# Patient Record
Sex: Male | Born: 1935
Health system: Southern US, Community
[De-identification: ages and names within clinical notes are randomized; demographics above are authoritative.]

## PROBLEM LIST (undated history)

## (undated) DIAGNOSIS — K222 Esophageal obstruction: Secondary | ICD-10-CM

## (undated) DIAGNOSIS — R0600 Dyspnea, unspecified: Secondary | ICD-10-CM

## (undated) DIAGNOSIS — M549 Dorsalgia, unspecified: Secondary | ICD-10-CM

## (undated) DIAGNOSIS — I350 Nonrheumatic aortic (valve) stenosis: Secondary | ICD-10-CM

## (undated) DIAGNOSIS — I6529 Occlusion and stenosis of unspecified carotid artery: Secondary | ICD-10-CM

## (undated) DIAGNOSIS — I2510 Atherosclerotic heart disease of native coronary artery without angina pectoris: Secondary | ICD-10-CM

## (undated) DIAGNOSIS — G8929 Other chronic pain: Secondary | ICD-10-CM

## (undated) DIAGNOSIS — J449 Chronic obstructive pulmonary disease, unspecified: Secondary | ICD-10-CM

## (undated) DIAGNOSIS — I714 Abdominal aortic aneurysm, without rupture: Secondary | ICD-10-CM

## (undated) DIAGNOSIS — K219 Gastro-esophageal reflux disease without esophagitis: Secondary | ICD-10-CM

## (undated) DIAGNOSIS — I050 Rheumatic mitral stenosis: Secondary | ICD-10-CM

## (undated) DIAGNOSIS — H269 Unspecified cataract: Secondary | ICD-10-CM

## (undated) DIAGNOSIS — E785 Hyperlipidemia, unspecified: Secondary | ICD-10-CM

## (undated) DIAGNOSIS — I739 Peripheral vascular disease, unspecified: Secondary | ICD-10-CM

## (undated) DIAGNOSIS — I219 Acute myocardial infarction, unspecified: Secondary | ICD-10-CM

## (undated) DIAGNOSIS — E119 Type 2 diabetes mellitus without complications: Secondary | ICD-10-CM

## (undated) DIAGNOSIS — D126 Benign neoplasm of colon, unspecified: Secondary | ICD-10-CM

## (undated) DIAGNOSIS — I272 Pulmonary hypertension, unspecified: Secondary | ICD-10-CM

## (undated) DIAGNOSIS — G1221 Amyotrophic lateral sclerosis: Secondary | ICD-10-CM

## (undated) DIAGNOSIS — G629 Polyneuropathy, unspecified: Secondary | ICD-10-CM

## (undated) DIAGNOSIS — Z9289 Personal history of other medical treatment: Secondary | ICD-10-CM

## (undated) HISTORY — DX: Hyperlipidemia, unspecified: E78.5

## (undated) HISTORY — DX: Esophageal obstruction: K22.2

## (undated) HISTORY — DX: Abdominal aortic aneurysm, without rupture: I71.4

## (undated) HISTORY — DX: Gastro-esophageal reflux disease without esophagitis: K21.9

## (undated) HISTORY — DX: Rheumatic mitral stenosis: I05.0

## (undated) HISTORY — DX: Chronic obstructive pulmonary disease, unspecified: J44.9

## (undated) HISTORY — DX: Occlusion and stenosis of unspecified carotid artery: I65.29

## (undated) HISTORY — PX: APPENDECTOMY: SHX54

## (undated) HISTORY — DX: Dyspnea, unspecified: R06.00

## (undated) HISTORY — PX: ESOPHAGEAL DILATION: SHX303

## (undated) HISTORY — DX: Pulmonary hypertension, unspecified: I27.20

## (undated) HISTORY — DX: Acute myocardial infarction, unspecified: I21.9

## (undated) HISTORY — DX: Peripheral vascular disease, unspecified: I73.9

## (undated) HISTORY — DX: Atherosclerotic heart disease of native coronary artery without angina pectoris: I25.10

## (undated) HISTORY — DX: Type 2 diabetes mellitus without complications: E11.9

## (undated) HISTORY — DX: Unspecified cataract: H26.9

## (undated) HISTORY — DX: Benign neoplasm of colon, unspecified: D12.6

## (undated) HISTORY — PX: OTHER SURGICAL HISTORY: SHX169

## (undated) HISTORY — DX: Nonrheumatic aortic (valve) stenosis: I35.0

## (undated) HISTORY — DX: Personal history of other medical treatment: Z92.89

---

## 1999-04-26 ENCOUNTER — Encounter: Payer: Self-pay | Admitting: Gastroenterology

## 1999-04-26 ENCOUNTER — Ambulatory Visit (HOSPITAL_COMMUNITY): Admission: RE | Admit: 1999-04-26 | Discharge: 1999-04-26 | Payer: Self-pay | Admitting: Gastroenterology

## 2000-01-16 ENCOUNTER — Encounter: Payer: Self-pay | Admitting: *Deleted

## 2000-01-16 ENCOUNTER — Inpatient Hospital Stay (HOSPITAL_COMMUNITY): Admission: EM | Admit: 2000-01-16 | Discharge: 2000-01-17 | Payer: Self-pay | Admitting: *Deleted

## 2000-01-17 ENCOUNTER — Encounter: Payer: Self-pay | Admitting: *Deleted

## 2000-07-31 ENCOUNTER — Ambulatory Visit (HOSPITAL_COMMUNITY): Admission: RE | Admit: 2000-07-31 | Discharge: 2000-07-31 | Payer: Self-pay | Admitting: Gastroenterology

## 2000-07-31 ENCOUNTER — Encounter: Payer: Self-pay | Admitting: Gastroenterology

## 2000-09-11 ENCOUNTER — Ambulatory Visit (HOSPITAL_COMMUNITY): Admission: RE | Admit: 2000-09-11 | Discharge: 2000-09-11 | Payer: Self-pay | Admitting: Gastroenterology

## 2000-09-11 ENCOUNTER — Encounter (INDEPENDENT_AMBULATORY_CARE_PROVIDER_SITE_OTHER): Payer: Self-pay | Admitting: Specialist

## 2001-10-11 ENCOUNTER — Encounter: Payer: Self-pay | Admitting: Neurosurgery

## 2001-10-11 ENCOUNTER — Ambulatory Visit (HOSPITAL_COMMUNITY): Admission: RE | Admit: 2001-10-11 | Discharge: 2001-10-11 | Payer: Self-pay | Admitting: Neurosurgery

## 2002-01-13 ENCOUNTER — Encounter: Admission: RE | Admit: 2002-01-13 | Discharge: 2002-01-13 | Payer: Self-pay | Admitting: Neurosurgery

## 2002-01-13 ENCOUNTER — Encounter: Payer: Self-pay | Admitting: Neurosurgery

## 2006-01-25 ENCOUNTER — Ambulatory Visit: Payer: Self-pay | Admitting: Internal Medicine

## 2006-02-01 ENCOUNTER — Ambulatory Visit: Payer: Self-pay | Admitting: Internal Medicine

## 2006-02-01 LAB — CONVERTED CEMR LAB
AST: 29 units/L (ref 0–37)
BUN: 6 mg/dL (ref 6–23)
CO2: 30 meq/L (ref 19–32)
Creatinine, Ser: 0.9 mg/dL (ref 0.4–1.5)
Glomerular Filtration Rate, Af Am: 107 mL/min/{1.73_m2}
HDL: 25.3 mg/dL — ABNORMAL LOW (ref >39.0)
LDL DIRECT: 120.3 mg/dL
MCV: 90.1 fL (ref 78.0–100.0)
Platelets: 271 10*3/uL (ref 150–400)
Potassium: 4.9 meq/L (ref 3.5–5.1)
RBC: 5.18 M/uL (ref 4.22–5.81)
Sed Rate: 3 mm/hr (ref 0–20)
WBC: 7.5 10*3/uL (ref 4.5–10.5)

## 2006-02-05 ENCOUNTER — Ambulatory Visit: Payer: Self-pay | Admitting: *Deleted

## 2006-02-12 ENCOUNTER — Encounter: Admission: RE | Admit: 2006-02-12 | Discharge: 2006-02-12 | Payer: Self-pay | Admitting: Internal Medicine

## 2006-02-19 ENCOUNTER — Ambulatory Visit: Payer: Self-pay | Admitting: Cardiology

## 2006-02-27 ENCOUNTER — Inpatient Hospital Stay (HOSPITAL_BASED_OUTPATIENT_CLINIC_OR_DEPARTMENT_OTHER): Admission: RE | Admit: 2006-02-27 | Discharge: 2006-02-27 | Payer: Self-pay | Admitting: Cardiology

## 2006-02-27 ENCOUNTER — Ambulatory Visit: Payer: Self-pay | Admitting: Cardiology

## 2006-02-27 ENCOUNTER — Inpatient Hospital Stay (HOSPITAL_COMMUNITY): Admission: AD | Admit: 2006-02-27 | Discharge: 2006-02-28 | Payer: Self-pay | Admitting: Cardiology

## 2006-03-02 DIAGNOSIS — I739 Peripheral vascular disease, unspecified: Secondary | ICD-10-CM

## 2006-03-02 HISTORY — DX: Peripheral vascular disease, unspecified: I73.9

## 2006-03-05 ENCOUNTER — Ambulatory Visit: Payer: Self-pay

## 2006-03-05 ENCOUNTER — Ambulatory Visit: Payer: Self-pay | Admitting: Cardiology

## 2006-03-05 ENCOUNTER — Encounter: Payer: Self-pay | Admitting: Pulmonary Disease

## 2006-03-07 ENCOUNTER — Ambulatory Visit: Payer: Self-pay

## 2006-03-19 ENCOUNTER — Ambulatory Visit: Payer: Self-pay | Admitting: Internal Medicine

## 2006-03-20 ENCOUNTER — Ambulatory Visit: Payer: Self-pay | Admitting: Internal Medicine

## 2006-03-20 LAB — CONVERTED CEMR LAB
Cholesterol: 121 mg/dL (ref 0–200)
HDL: 31.4 mg/dL — ABNORMAL LOW (ref >39.0)
LDL Cholesterol: 70 mg/dL (ref 0–99)

## 2006-03-21 ENCOUNTER — Encounter: Payer: Self-pay | Admitting: Pulmonary Disease

## 2006-03-21 ENCOUNTER — Ambulatory Visit: Payer: Self-pay | Admitting: Pulmonary Disease

## 2006-04-04 ENCOUNTER — Ambulatory Visit: Payer: Self-pay | Admitting: Cardiology

## 2006-04-14 DIAGNOSIS — K219 Gastro-esophageal reflux disease without esophagitis: Secondary | ICD-10-CM

## 2006-04-14 DIAGNOSIS — I739 Peripheral vascular disease, unspecified: Secondary | ICD-10-CM

## 2006-06-18 ENCOUNTER — Ambulatory Visit: Payer: Self-pay | Admitting: Internal Medicine

## 2006-06-26 ENCOUNTER — Ambulatory Visit: Payer: Self-pay | Admitting: Internal Medicine

## 2006-06-26 LAB — CONVERTED CEMR LAB
Cholesterol: 133 mg/dL (ref 0–200)
HDL: 33.9 mg/dL — ABNORMAL LOW (ref >39.0)
LDL Cholesterol: 76 mg/dL (ref 0–99)
Total CHOL/HDL Ratio: 3.9

## 2006-07-04 ENCOUNTER — Encounter: Payer: Self-pay | Admitting: Internal Medicine

## 2006-07-04 ENCOUNTER — Ambulatory Visit: Payer: Self-pay | Admitting: Internal Medicine

## 2006-07-11 ENCOUNTER — Ambulatory Visit: Payer: Self-pay | Admitting: Cardiology

## 2006-08-15 ENCOUNTER — Encounter: Payer: Self-pay | Admitting: Internal Medicine

## 2007-04-10 ENCOUNTER — Ambulatory Visit: Payer: Self-pay

## 2007-07-17 ENCOUNTER — Ambulatory Visit: Payer: Self-pay | Admitting: Cardiovascular Disease

## 2007-10-24 ENCOUNTER — Ambulatory Visit: Payer: Self-pay | Admitting: Cardiovascular Disease

## 2007-10-24 ENCOUNTER — Ambulatory Visit: Payer: Self-pay

## 2007-10-24 LAB — CONVERTED CEMR LAB
AST: 28 units/L (ref 0–37)
HDL: 24.4 mg/dL — ABNORMAL LOW (ref >39.0)
LDL Cholesterol: 115 mg/dL — ABNORMAL HIGH (ref 0–99)
Total Bilirubin: 1.2 mg/dL (ref 0.3–1.2)
Total Protein: 6.5 g/dL (ref 6.0–8.3)

## 2008-04-14 ENCOUNTER — Ambulatory Visit: Payer: Self-pay | Admitting: Internal Medicine

## 2008-04-14 DIAGNOSIS — E785 Hyperlipidemia, unspecified: Secondary | ICD-10-CM

## 2008-04-14 LAB — CONVERTED CEMR LAB: Glucose, Bld: 135 mg/dL

## 2008-04-16 ENCOUNTER — Telehealth: Payer: Self-pay | Admitting: Internal Medicine

## 2008-04-16 LAB — CONVERTED CEMR LAB
BUN: 9 mg/dL (ref 6–23)
Eosinophils Relative: 2.1 % (ref 0.0–5.0)
GFR calc Af Amer: 143 mL/min
GFR calc non Af Amer: 118 mL/min
Hemoglobin: 16.1 g/dL (ref 13.0–17.0)
MCHC: 34.7 g/dL (ref 30.0–36.0)
Monocytes Absolute: 0.8 10*3/uL (ref 0.1–1.0)
Neutrophils Relative %: 63.3 % (ref 43.0–77.0)
Platelets: 273 10*3/uL (ref 150–400)
RBC: 5.24 M/uL (ref 4.22–5.81)
RDW: 13.2 % (ref 11.5–14.6)

## 2008-04-20 ENCOUNTER — Ambulatory Visit: Payer: Self-pay | Admitting: Cardiovascular Disease

## 2008-04-20 ENCOUNTER — Ambulatory Visit: Payer: Self-pay

## 2008-04-20 ENCOUNTER — Encounter: Payer: Self-pay | Admitting: Internal Medicine

## 2008-04-29 ENCOUNTER — Ambulatory Visit: Payer: Self-pay | Admitting: Internal Medicine

## 2008-05-17 ENCOUNTER — Ambulatory Visit: Payer: Self-pay | Admitting: Internal Medicine

## 2008-05-18 ENCOUNTER — Encounter (INDEPENDENT_AMBULATORY_CARE_PROVIDER_SITE_OTHER): Payer: Self-pay | Admitting: *Deleted

## 2008-05-26 ENCOUNTER — Ambulatory Visit: Payer: Self-pay | Admitting: Pulmonary Disease

## 2008-05-26 ENCOUNTER — Encounter: Payer: Self-pay | Admitting: Pulmonary Disease

## 2008-05-31 DIAGNOSIS — I050 Rheumatic mitral stenosis: Secondary | ICD-10-CM

## 2008-05-31 DIAGNOSIS — I714 Abdominal aortic aneurysm, without rupture, unspecified: Secondary | ICD-10-CM

## 2008-05-31 DIAGNOSIS — I2510 Atherosclerotic heart disease of native coronary artery without angina pectoris: Secondary | ICD-10-CM

## 2008-05-31 DIAGNOSIS — I350 Nonrheumatic aortic (valve) stenosis: Secondary | ICD-10-CM

## 2008-05-31 HISTORY — DX: Rheumatic mitral stenosis: I05.0

## 2008-05-31 HISTORY — DX: Abdominal aortic aneurysm, without rupture, unspecified: I71.40

## 2008-05-31 HISTORY — DX: Nonrheumatic aortic (valve) stenosis: I35.0

## 2008-05-31 HISTORY — DX: Atherosclerotic heart disease of native coronary artery without angina pectoris: I25.10

## 2008-05-31 HISTORY — DX: Abdominal aortic aneurysm, without rupture: I71.4

## 2008-06-02 ENCOUNTER — Ambulatory Visit: Payer: Self-pay | Admitting: Cardiovascular Disease

## 2008-06-02 LAB — CONVERTED CEMR LAB
BUN: 9 mg/dL (ref 6–23)
CO2: 30 meq/L (ref 19–32)
Chloride: 105 meq/L (ref 96–112)
Creatinine, Ser: 0.9 mg/dL (ref 0.4–1.5)
GFR calc Af Amer: 107 mL/min
HCT: 44.2 % (ref 39.0–52.0)
Hemoglobin: 15.1 g/dL (ref 13.0–17.0)
INR: 0.9 (ref 0.8–1.0)
Monocytes Absolute: 0.8 10*3/uL (ref 0.1–1.0)
Potassium: 5 meq/L (ref 3.5–5.1)
Prothrombin Time: 10 s — ABNORMAL LOW (ref 10.9–13.3)
RBC: 5.04 M/uL (ref 4.22–5.81)
Sodium: 140 meq/L (ref 135–145)
WBC: 8.4 10*3/uL (ref 4.5–10.5)

## 2008-06-04 ENCOUNTER — Ambulatory Visit: Payer: Self-pay | Admitting: Cardiovascular Disease

## 2008-06-04 ENCOUNTER — Inpatient Hospital Stay (HOSPITAL_BASED_OUTPATIENT_CLINIC_OR_DEPARTMENT_OTHER): Admission: RE | Admit: 2008-06-04 | Discharge: 2008-06-04 | Payer: Self-pay | Admitting: Cardiovascular Disease

## 2008-06-25 ENCOUNTER — Ambulatory Visit: Payer: Self-pay | Admitting: Pulmonary Disease

## 2008-06-25 LAB — CONVERTED CEMR LAB
Calcium: 9.8 mg/dL (ref 8.4–10.5)
GFR calc non Af Amer: 87.92 mL/min (ref >60)
Glucose, Bld: 106 mg/dL — ABNORMAL HIGH (ref 70–99)
Potassium: 4.7 meq/L (ref 3.5–5.1)

## 2008-06-29 DIAGNOSIS — I2510 Atherosclerotic heart disease of native coronary artery without angina pectoris: Secondary | ICD-10-CM

## 2008-06-29 DIAGNOSIS — I050 Rheumatic mitral stenosis: Secondary | ICD-10-CM | POA: Insufficient documentation

## 2008-06-29 DIAGNOSIS — I350 Nonrheumatic aortic (valve) stenosis: Secondary | ICD-10-CM

## 2008-06-29 DIAGNOSIS — I2789 Other specified pulmonary heart diseases: Secondary | ICD-10-CM

## 2008-06-29 DIAGNOSIS — I714 Abdominal aortic aneurysm, without rupture: Secondary | ICD-10-CM

## 2008-07-01 ENCOUNTER — Ambulatory Visit: Payer: Self-pay | Admitting: Cardiovascular Disease

## 2008-07-01 ENCOUNTER — Encounter: Payer: Self-pay | Admitting: Cardiovascular Disease

## 2008-07-01 ENCOUNTER — Ambulatory Visit: Payer: Self-pay

## 2008-07-09 ENCOUNTER — Ambulatory Visit: Payer: Self-pay | Admitting: Pulmonary Disease

## 2008-07-09 ENCOUNTER — Ambulatory Visit: Payer: Self-pay | Admitting: Cardiology

## 2008-07-12 ENCOUNTER — Ambulatory Visit: Payer: Self-pay | Admitting: Pulmonary Disease

## 2008-07-12 DIAGNOSIS — J438 Other emphysema: Secondary | ICD-10-CM | POA: Insufficient documentation

## 2008-07-14 ENCOUNTER — Encounter: Payer: Self-pay | Admitting: Pulmonary Disease

## 2008-08-12 ENCOUNTER — Ambulatory Visit: Payer: Self-pay | Admitting: Pulmonary Disease

## 2008-09-29 ENCOUNTER — Encounter: Payer: Self-pay | Admitting: Pulmonary Disease

## 2009-02-27 ENCOUNTER — Emergency Department (HOSPITAL_BASED_OUTPATIENT_CLINIC_OR_DEPARTMENT_OTHER): Admission: EM | Admit: 2009-02-27 | Discharge: 2009-02-27 | Payer: Self-pay | Admitting: Emergency Medicine

## 2009-02-27 ENCOUNTER — Encounter: Payer: Self-pay | Admitting: Internal Medicine

## 2009-02-27 ENCOUNTER — Ambulatory Visit: Payer: Self-pay | Admitting: Diagnostic Radiology

## 2009-03-02 ENCOUNTER — Ambulatory Visit: Payer: Self-pay | Admitting: Internal Medicine

## 2009-03-03 ENCOUNTER — Telehealth (INDEPENDENT_AMBULATORY_CARE_PROVIDER_SITE_OTHER): Payer: Self-pay | Admitting: *Deleted

## 2009-04-04 ENCOUNTER — Ambulatory Visit: Payer: Self-pay | Admitting: Internal Medicine

## 2009-04-04 DIAGNOSIS — R519 Headache, unspecified: Secondary | ICD-10-CM | POA: Insufficient documentation

## 2009-04-04 DIAGNOSIS — R51 Headache: Secondary | ICD-10-CM

## 2009-04-04 DIAGNOSIS — R42 Dizziness and giddiness: Secondary | ICD-10-CM

## 2009-04-05 ENCOUNTER — Encounter: Payer: Self-pay | Admitting: Internal Medicine

## 2009-04-05 ENCOUNTER — Ambulatory Visit: Payer: Self-pay | Admitting: Cardiology

## 2009-05-03 ENCOUNTER — Telehealth (INDEPENDENT_AMBULATORY_CARE_PROVIDER_SITE_OTHER): Payer: Self-pay | Admitting: *Deleted

## 2009-05-06 ENCOUNTER — Ambulatory Visit: Payer: Self-pay | Admitting: Internal Medicine

## 2009-05-12 ENCOUNTER — Ambulatory Visit: Payer: Self-pay | Admitting: Internal Medicine

## 2009-05-13 LAB — CONVERTED CEMR LAB
ALT: 17 units/L (ref 0–53)
AST: 28 units/L (ref 0–37)
Cholesterol: 191 mg/dL (ref 0–200)
HDL: 33.1 mg/dL — ABNORMAL LOW (ref >39.00)
VLDL: 21 mg/dL (ref 0.0–40.0)

## 2009-06-06 ENCOUNTER — Ambulatory Visit: Payer: Self-pay | Admitting: Internal Medicine

## 2009-06-06 ENCOUNTER — Inpatient Hospital Stay (HOSPITAL_COMMUNITY): Admission: AD | Admit: 2009-06-06 | Discharge: 2009-06-08 | Payer: Self-pay | Admitting: Internal Medicine

## 2009-06-06 DIAGNOSIS — R000 Tachycardia, unspecified: Secondary | ICD-10-CM

## 2009-06-06 DIAGNOSIS — I951 Orthostatic hypotension: Secondary | ICD-10-CM

## 2009-06-07 ENCOUNTER — Encounter (INDEPENDENT_AMBULATORY_CARE_PROVIDER_SITE_OTHER): Payer: Self-pay | Admitting: Internal Medicine

## 2009-06-07 ENCOUNTER — Ambulatory Visit: Payer: Self-pay | Admitting: Vascular Surgery

## 2009-06-24 ENCOUNTER — Ambulatory Visit: Payer: Self-pay | Admitting: Internal Medicine

## 2009-06-24 DIAGNOSIS — I739 Peripheral vascular disease, unspecified: Secondary | ICD-10-CM

## 2009-06-24 LAB — CONVERTED CEMR LAB
Glucose, Bld: 103 mg/dL
Hemoglobin: 16.3 g/dL

## 2009-06-27 LAB — CONVERTED CEMR LAB
AST: 28 units/L (ref 0–37)
Albumin: 3.8 g/dL (ref 3.5–5.2)
Alkaline Phosphatase: 64 units/L (ref 39–117)
LDL Cholesterol: 101 mg/dL — ABNORMAL HIGH (ref 0–99)

## 2009-07-21 ENCOUNTER — Encounter: Payer: Self-pay | Admitting: Cardiovascular Disease

## 2009-07-25 ENCOUNTER — Ambulatory Visit: Payer: Self-pay

## 2009-07-25 ENCOUNTER — Ambulatory Visit: Payer: Self-pay | Admitting: Cardiology

## 2009-07-25 ENCOUNTER — Encounter: Payer: Self-pay | Admitting: Internal Medicine

## 2009-07-25 ENCOUNTER — Ambulatory Visit (HOSPITAL_COMMUNITY): Admission: RE | Admit: 2009-07-25 | Discharge: 2009-07-25 | Payer: Self-pay | Admitting: Cardiovascular Disease

## 2009-07-25 ENCOUNTER — Encounter: Payer: Self-pay | Admitting: Cardiovascular Disease

## 2009-07-28 ENCOUNTER — Ambulatory Visit: Payer: Self-pay | Admitting: Cardiovascular Disease

## 2009-08-10 ENCOUNTER — Encounter: Payer: Self-pay | Admitting: Cardiovascular Disease

## 2009-09-12 ENCOUNTER — Encounter: Admission: RE | Admit: 2009-09-12 | Discharge: 2009-09-12 | Payer: Self-pay | Admitting: Orthopedic Surgery

## 2009-09-27 ENCOUNTER — Encounter: Payer: Self-pay | Admitting: Internal Medicine

## 2010-01-04 ENCOUNTER — Encounter: Payer: Self-pay | Admitting: Cardiovascular Disease

## 2010-01-04 ENCOUNTER — Ambulatory Visit: Payer: Self-pay | Admitting: Vascular Surgery

## 2010-02-21 ENCOUNTER — Encounter: Payer: Self-pay | Admitting: Internal Medicine

## 2010-04-02 DIAGNOSIS — I219 Acute myocardial infarction, unspecified: Secondary | ICD-10-CM

## 2010-04-02 HISTORY — DX: Acute myocardial infarction, unspecified: I21.9

## 2010-05-02 NOTE — Assessment & Plan Note (Signed)
Summary: nausea, lightheaded, possible dehydration//lch   Vital Signs:  Patient profile:   77 year old male Height:      67 inches Weight:      221.8 pounds O2 Sat:      93 % on Room air Pulse (ortho):   114 / minute BP standing:   100 / 60  Vitals Entered By: Shary Decamp (June 06, 2009 2:14 PM)  O2 Flow:  Room air CC: acute only; weak, dizzy, ha, nausea, vomiting x 1 only, stools normal x 3:30am Comments quit simvastatin 1 week ago due to myalgia Shary Decamp  June 06, 2009 2:26 PM    Serial Vital Signs/Assessments:  Time      Position  BP       Pulse  Resp  Temp     By 2:25 PM   Lying RA  120/80   114                   Stacia Hawks 2:25 PM   Sitting   110/60   116                   Stacia Hawks 2:25 PM   Standing  100/60   114                   Stacia Hawks   History of Present Illness: here with his granddaughter Joni Reining chief complaint dizziness the patient woke up this morning at 3:30 a.m. with severe dizziness," swimming head", this was associated with nausea, he vomited once dizziness is worse when he stands or when he moves his head  he had a similar episode a few months ago (11-10), he went to the emergency room diagnosed with gastroenteritis and receive IV fluids  ROS denies fevers when asked, states that he does have chest pain sometimes, episodes  last less than a minute, in the anterior upper chest, no radiation, never enough to  take nitroglycerin no palpitations  denies diarrhea, no blood in the stools or change stool color .  He does have some ill-defined upper abdominal  discomfort  denies any slurred speech, motor deficits or face deficits  he admits to feel short of breath for a while, symptoms increased lately.  He is 6 and her previous smoker, occasionally coughs  w/ brown sputum. No recent or plane trips orlower extremity edema  Current Medications (verified): 1)  Prilosec 20 Mg Cpdr (Omeprazole) .Marland Kitchen.. 1 By Mouth Once Daily 2)  Bayer Aspirin  325 Mg Tabs (Aspirin) .Marland Kitchen.. 1 A Day 3)  Nitroglycerin 0.4 Mg Subl (Nitroglycerin) .... One Tablet Under Tongue Every 5 Minutes As Needed For Chest Pain---May Repeat Times Three  Allergies (verified): No Known Drug Allergies  Past History:  Past Medical History: Reviewed history from 05/06/2009 and no changes required. GERD, with h/o stricture COPD, 04-2006: PFTs mild obstruction Peripheral vascular disease, iliac  (per cardiac cath 12/07) 2007 Cath: non obstructive CAD, mild MS, trivial AoS, small AAA Dyspnea 2007: w/u included a cath and PFTs, symptoms thought to be pulmonary R arm Bx : Bowen's disease, was refered to dermatology Hyperlipidemia Emphysema      - PFT 04/10 FVC 3.45 (90%), FEV1 2.72 (106%), TLC 5.36 (93%), DLCO 74%, no BD response      - ?of pseudonormalization of PFT findings with both obstructive and restrictive defects ABDOMINAL AORTIC ANEURYSM--per cardiac catheterization 3 --2010 AORTIC STENOSIS, MILD,  MITRAL STENOSIS  --her cardiac catheterization 3 --2010 CORONARY  ARTERY DISEASE --mild, medical manage , per cardiac catheterization 3-- 2010 HYPERTENSION, PULMONARY   Past Surgical History: Reviewed history from 06/29/2008 and no changes required. Appendectomy esophageal dilatation  coronary angiography, abdominal aortography-02-27-06 Dr. Samule Ohm  Social History: Reviewed history from 05/06/2009 and no changes required.  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   He enjoys watching sports.   He has been widowed for 12 years.   He has 2 grown sons.   He previously smoked more than 70 pack years but quit in 1988.  Denies significant alcohol use.   Denies illicit drug use.  Review of Systems      See HPI  Physical Exam  General:  alert and well-developed. no apparent distress, not diaphoretic Lungs:  Normal respiratory effort, chest expands symmetrically. Lungs are clear to auscultation, no crackles or wheezes.  decreased BS Heart:  tachycardic Abdomen:  soft, no distention, no masses, no guarding, and no rigidity.  mild lower abdominal tenderness without rebound Rectal:  No external abnormalities noted. Normal sphincter tone. No rectal masses or tenderness. Brown Hemoccult-negative Prostate:  prostate slightly enlarged, nontender Extremities:  no edema Neurologic:  speech fluent motor without obvious deficits face symmetric EOMI  Psych:  Oriented X3, memory intact for recent and remote, normally interactive, good eye contact, not anxious appearing, and not depressed appearing.     Impression & Recommendations:  Problem # 1:  assessment and plan 77 year old gentleman with multiple cardiovascular risk factors, hypertension, COPD, mild CAD her cardiac catheterization 05/2008, peripheral vascular disease and a AAA presents today with the following problems: -acute onset of dizziness with a normal neurological exam -nausea, vomited once -tachycardia, EKG  RBBB (old) ,TWI V1 V2 (new) -mild orthostatic hypotension -mild lower abdominal tenderness on exam  plan: Admit to telemetry, CT of the head, IV fluids, call cardiology, rule out MI with serial enzymes. Discussed with Dr. Janee Morn consider call neurology  Problem # 2:  addendum we also  --called cardiology for a consult -- discussed EKG with the cardiologist on call  --- no acute changes. --time spent coordinating this patient care: More than 55 minutes  Complete Medication List: 1)  Prilosec 20 Mg Cpdr (Omeprazole) .Marland Kitchen.. 1 by mouth once daily 2)  Bayer Aspirin 325 Mg Tabs (Aspirin) .Marland Kitchen.. 1 a day 3)  Nitroglycerin 0.4 Mg Subl (Nitroglycerin) .... One tablet under tongue every 5 minutes as needed for chest pain---may repeat times three  Other Orders: Fingerstick (55732) Hgb (85018) Glucose, (CBG) (20254)  Laboratory Results   Blood Tests     Glucose (random): 117 mg/dL   (Normal Range: 27-062)   CBC   HGB:  17.2 g/dL   (Normal  Range: 37.6-28.3 in Males, 12.0-15.0 in Females)

## 2010-05-02 NOTE — Letter (Signed)
Summary: [facility] Office Note  [facility] Office Note   Imported By: Roderic Ovens 08/30/2009 11:00:01  _____________________________________________________________________  External Attachment:    Type:   Image     Comment:   External Document

## 2010-05-02 NOTE — Op Note (Signed)
Summary: Lumbar Epidural Steroid Injection/[facility]   Lumbar Epidural Steroid Injection/[facility]   Imported By: Lanelle Bal 03/01/2010 13:14:00  _____________________________________________________________________  External Attachment:    Type:   Image     Comment:   External Document

## 2010-05-02 NOTE — Assessment & Plan Note (Signed)
Summary: dizzy spells/CT scan reviewed : 3.3 cm infrarenal AAA; this s...   Vital Signs:  Patient profile:   77 year old male Weight:      221 pounds BMI:     34.74 O2 Sat:      96 % Temp:     98.7 degrees F oral Pulse rate:   85 / minute Resp:     16 per minute BP sitting:   116 / 64  (left arm) Cuff size:   large  Vitals Entered By: Shonna Chock (April 04, 2009 4:37 PM) CC: Dizzy Spells(Ongoing concern), Headaches Comments REVIEWED MED LIST, PATIENT AGREED DOSE AND INSTRUCTION CORRECT    Primary Care Provider:  Nolon Rod. Paz MD  CC:  Dizzy Spells(Ongoing concern) and Headaches.  History of Present Illness: Progressive headaches & dizziness over 2 months. The patient denies nausea, vomiting, sweats, tearing of eyes, nasal congestion, sinus pain, sinus pressure, photophobia, and phonophobia.  The headache is described as mid frontal,  intermittent and pressure-like, lasting 10 minutes or <.  High-risk features (red flags) include vision loss or change, new type of headache, and age >50 years.  The patient denies the following high-risk features: fever, neck pain/stiffness, focal weakness, altered mental status, rash, trauma, pain worse with exertion, immunosuppression, and anticoagulation use.  Prior treatment has included a NSAID or Excedrin with some benefit. No triggers. No prodrome or aura. No PMH or FH of migraines.  ? syncope on  01/2009( 02/28/2009 ER visit: Gastroenteritis; no mention of dizziness).3-4 cups coffee/ day; no excess NSAIDS. Dizzines not related to positition change or heart rhythm; it lasts 3-4 min.  Allergies (verified): No Known Drug Allergies  Review of Systems General:  Denies chills, sweats, and weight loss. Eyes:  Complains of blurring; denies double vision and vision loss-both eyes. ENT:  Complains of decreased hearing and ringing in ears; Above are chronic ; no facial pain or purulence. CV:  Complains of lightheadness; denies near fainting and  palpitations. Neuro:  Complains of poor balance and sensation of room spinning; denies brief paralysis, numbness, tingling, and weakness; Rare  minor vertigo .  Physical Exam  General:  well-nourished,in no acute distress; alert,appropriate and cooperative throughout examination Eyes:  No corneal or conjunctival inflammation noted. EOMI but OS deviates laterally with accommodation. Ptosis OS.Perrla. Field of  Vision grossly normal. Ears:  External ear exam shows no significant lesions or deformities.  Otoscopic examination reveals clear canals, tympanic membranes are intact bilaterally without bulging, retraction, inflammation or discharge. Hearing is grossly decreased  bilaterally.TMs dull Heart:  normal rate, regular rhythm, no gallop, no rub, no JVD, and grade1.5-2  /6 systolic murmur.   Pulses:  R and L carotid bruits Neurologic:  alert & oriented X3, cranial nerves II-XII intact except EOM, strength normal in all extremities except L hand , gait abnormal , limping on R, DTRs symmetrical and normal, finger-to-nose normal, and Romberg negative.   Skin:  Intact without suspicious lesions or rashes Psych:  subdued.  Slow responses but oriented    Impression & Recommendations:  Problem # 1:  DIZZINESS (ICD-780.4)  Problem # 2:  HEADACHE (ICD-784.0)  His updated medication list for this problem includes:    Bayer Aspirin 325 Mg Tabs (Aspirin) .Marland Kitchen... 1 a day    Tramadol Hcl 50 Mg Tabs (Tramadol hcl) .Marland Kitchen... 1 q 6 hrs as needed headache  Orders: Radiology Referral (Radiology)  Complete Medication List: 1)  Prilosec 20 Mg Cpdr (Omeprazole) .Marland Kitchen.. 1 by mouth once  daily 2)  Bayer Aspirin 325 Mg Tabs (Aspirin) .Marland Kitchen.. 1 a day 3)  Nitroglycerin 0.4 Mg Subl (Nitroglycerin) .... One tablet under tongue every 5 minutes as needed for chest pain---may repeat times three 4)  Azithromycin 250 Mg Tabs (Azithromycin) .... As per pack 5)  Tramadol Hcl 50 Mg Tabs (Tramadol hcl) .Marland Kitchen.. 1 q 6 hrs as needed  headache  Patient Instructions: 1)  Keep Headache Diary . Wean caffeine as discussed Prescriptions: TRAMADOL HCL 50 MG TABS (TRAMADOL HCL) 1 q 6 hrs as needed headache  #30 x 1   Entered and Authorized by:   Marga Melnick MD   Signed by:   Marga Melnick MD on 04/04/2009   Method used:   Faxed to ...       [facility] [phone]* (retail)       [address]       [place], [place]  [address]       Ph: [phone]       Fax: [phone]   RxID:   [id number]

## 2010-05-02 NOTE — Assessment & Plan Note (Signed)
Summary: J4N      Allergies Added: NKDA  Visit Type:  Follow-up Primary Provider:  Nolon Rod. Paz MD  CC:  f1y/pt needs new Rx for Nitro.   Marland Kitchen  History of Present Illness: This is a 77 year old gentleman with chronic dyspnea, mixed valvular heart disease, and peripheral arterial disease, presenting for followup evaluation.  The patient has chronic dyspnea with exertion. This is grossly unchanged over time. He is fairly limited with class III symptoms.  His other major complaint is leg discomfort and numbness. He has leg pain both at rest and with activity. He complains of numbness that goes down the right leg from the hip region into the foot and it is worse with standing or walking. He also complains of left foot numbness. He denies calf muscle claudication.  The patient denies chest pain, orthopnea, PND, or edema. He was hospitalized a few months ago with near syncope after a viral illness. He's had no further problems with this symptom.    Current Medications (verified): 1)  Prilosec 20 Mg Cpdr (Omeprazole) .Marland Kitchen.. 1 By Mouth Once Daily 2)  Bayer Aspirin 325 Mg Tabs (Aspirin) .Marland Kitchen.. 1 A Day 3)  Nitroglycerin 0.4 Mg Subl (Nitroglycerin) .... One Tablet Under Tongue Every 5 Minutes As Needed For Chest Pain---May Repeat Times Three 4)  Zetia 10 Mg Tabs (Ezetimibe) .Marland Kitchen.. 1 A Day - Due Office Visit July 2011  Allergies (verified): No Known Drug Allergies  Past History:  Past medical history reviewed for relevance to current acute and chronic problems.  Past Medical History: Reviewed history from 06/24/2009 and no changes required. GERD, with h/o stricture Peripheral vascular disease, iliac  (per cardiac cath 12/07) Carotid Artery Dz , last u/s 3-11, next 6 months , per vasc. surgery  2007 Cath: non obstructive CAD, mild MS, trivial AoS, small AAA Dyspnea 2007: w/u included a cath and PFTs, symptoms thought to be pulmonary R arm Bx : Bowen's disease, was refered to  dermatology Hyperlipidemia Emphysema      - PFT 04/10 FVC 3.45 (90%), FEV1 2.72 (106%), TLC 5.36 (93%), DLCO 74%, no BD response      - ?of pseudonormalization of PFT findings with both obstructive and restrictive defects ABDOMINAL AORTIC ANEURYSM--per cardiac catheterization 3 --2010 AORTIC STENOSIS, MILD,  MITRAL STENOSIS  --her cardiac catheterization 3 --2010 CORONARY ARTERY DISEASE --mild, medical manage , per cardiac catheterization 3-- 2010 HYPERTENSION, PULMONARY   Review of Systems       Positive for generalized fatigue, otherwise negative except as per HPI   Vital Signs:  Patient profile:   77 year old male Height:      67 inches Weight:      221 pounds BMI:     34.74 Pulse rate:   81 / minute Pulse rhythm:   regular Resp:     16 per minute BP sitting:   128 / 86  (left arm) Cuff size:   large  Vitals Entered By: Judithe Modest CMA (July 28, 2009 9:43 AM)  Physical Exam  General:  Pt is alert and oriented, elderly, obese, in no acute distress. HEENT: normal Neck: normal carotid upstrokes with bilateral bruits, JVP normal Lungs: CTA CV: RRR with 3/6 harsh ejection murmur at the right upper sternal border Abd: soft, obese, NT, positive BS, no bruit, no organomegaly Ext: trace bilateral pretibial edema. peripheral pulses: right foot trace DP and PT pulses, left foot 1+ DP and PT pulses. Both feet are warm Skin: warm and dry without rash  EKG  Procedure date:  07/28/2009  Findings:      Normal sinus rhythm, right bundle branch block, left anterior fascicular block, heart rate 81 beats per minute.  Echocardiogram  Procedure date:  07/25/2009  Findings:      Study Conclusions            - Left ventricle: The cavity size was normal. There was moderate       concentric hypertrophy. Systolic function was normal. The       estimated ejection fraction was in the range of 55% to 60%. There       was dynamic obstruction, with mid-cavity obliteration. Wall  motion       was normal; there were no regional wall motion abnormalities.       Doppler parameters are consistent with abnormal left ventricular       relaxation (grade 1 diastolic dysfunction).     - Aortic valve: There was moderate stenosis. Valve area:       0.96cm 2(VTI). Valve area: 0.91cm 2 (Vmax).     - Mitral valve: Moderately to severely calcified annulus. The       findings are consistent with trivial stenosis. Mild regurgitation.       Valve area by pressure half-time: 2.24cm 2. Valve area by       continuity equation (using LVOT flow): 1.8cm 2.     - Left atrium: The atrium was moderately dilated.     - Tricuspid valve: Mild-moderate regurgitation.     - Pulmonary arteries: Systolic pressure was mildly to moderately       increased. PA peak pressure: 41mm Hg (S).         Impression & Recommendations:  Problem # 1:  AORTIC STENOSIS, MILD (ICD-424.1) The patient's aortic stenosis has progressed and is now in the moderate range. Recommend repeat echocardiogram in one year since he does have progressive aortic stenosis. I suspect his dyspnea is multifactorial and related to obesity, COPD, and valvular heart disease.  His updated medication list for this problem includes:    Nitroglycerin 0.4 Mg Subl (Nitroglycerin) ..... One tablet under tongue every 5 minutes as needed for chest pain---may repeat times three  Orders: Echocardiogram (Echo)  Problem # 2:  CAROTID ARTERY DISEASE (ICD-433.10) The patient was found to have moderate bilateral internal carotid artery stenosis during his hospitalization. Velocities by duplex ultrasound were in the range of 60-80% bilaterally. The patient was seen by vascular surgery and followup carotid duplex was recommended for this fall  If this is not done in the [facility] office, we will obtain a duplex in our office for followup. His updated medication list for this problem includes:    Bayer Aspirin 325 Mg Tabs (Aspirin) .Marland Kitchen... 1 a day  Orders: EKG  w/ Interpretation (93000)  Problem # 3:  ABDOMINAL AORTIC ANEURYSM (ICD-441.4) Small aneurysm at 3.4 x 3.5 cm on recent ultrasound. Followup in one year.  Problem # 4:  PERIPHERAL VASCULAR DISEASE (ICD-443.9) The patient has long-standing right common iliac occlusion and claudication symptoms related to this. His left ABI is normal at 1.0 and his right ABI is in the mild range of 0.71. I think his leg numbness and some of his leg pain is non-vascular in origin. He does have a history of low back problems and has been seen in the past by Dr. Darrelyn Hillock.  I asked him to contact Dr Jeannetta Ellis office for follow-up evaluation.  Will continue medical therapy first peripheral arterial disease and  repeat ABIs next year.  Other Orders: Abdominal Aorta Duplex (Abd Aorta Duplex) Arterial Duplex Lower Extremity (Arterial Duplex Low)  Patient Instructions: 1)  Your physician recommends that you schedule a follow-up appointment in: 12 months. The office will mail you a reminder letter 2 months prior appointment date. 2)  Your MD recommeds for you to see Dr. Darrelyn Hillock for leg and back pain. 3)  Your physician has requested that you have an echocardiogram.  Echocardiography is a painless test that uses sound waves to create images of your heart. It provides your doctor with information about the size and shape of your heart and how well your heart's chambers and valves are working.  This procedure takes approximately one hour. There are no restrictions for this procedure. to be done in 12 months 4)  Your physician has requested that you have an abdominal aorta  In 12 monthsduplex. During this test, an ultrasound is used to evaluate the aorta. Allow 30 minutes for this exam. Do not eat after midnight the day before and avoid carbonated beverages. There are no restrictions or special instructions. 5)  Your physician has requested that you have a lower or upper extremity arterial duplex.  This test is an ultrasound of the  arteries in the legs or arms.  It looks at arterial blood flow in the legs and arms.  Allow one hour for Lower and Upper Arterial scans. There are no restrictions or special instructions. with ABI's in 12 months.

## 2010-05-02 NOTE — Letter (Signed)
Summary: s/p CT myelogram, Rx PT and local shots-----Dr [facility]   Imported By: Lanelle Bal 10/14/2009 08:30:15  _____________________________________________________________________  External Attachment:    Type:   Image     Comment:   External Document

## 2010-05-02 NOTE — Assessment & Plan Note (Signed)
Summary: fu/kdc  Flu Vaccine Consent Questions     Do you have a history of severe allergic reactions to this vaccine? no    Any prior history of allergic reactions to egg and/or gelatin? no    Do you have a sensitivity to the preservative Thimersol? no    Do you have a past history of Guillan-Barre Syndrome? no    Do you currently have an acute febrile illness? no    Have you ever had a severe reaction to latex? no    Vaccine information given and explained to patient? yes    Are you currently pregnant? no    Lot Number:[id number]   Exp Date:09/29/2009   Site Given  rightt Deltoid IM Shary Decamp  May 06, 2009 3:51 PM   Vital Signs:  Patient profile:   77 year old male Height:      67 inches Weight:      221 pounds Pulse rate:   76 / minute BP sitting:   100 / 70  Vitals Entered By: Shary Decamp (May 06, 2009 2:22 PM) CC: rov   History of Present Illness: patient here at my request, last seen about a year ago several OV  and events since i last saw him are  reviewed :   cardiac catheterization 3 -- 10  ASSESSMENT:   1. Diffuse nonobstructive coronary artery disease as outlined above.   2. Normal left ventricular systolic function.   3. Mild mitral valve stenosis.   4. Mild aortic valve stenosis.   5. Abdominal aortic aneurysm.   6. Right common iliac occlusion.   Labs 11-10 at the ER: normal CBC , BMP, LFTs  HA-- saw Dr Alfonse Flavors recently, CT neg, symptoms resolved GERD, on prilosec, symptoms well controlled  COPD-- breathing "terrible", gradually worse  Hyperlipidemia-- not on meds     Current Medications (verified): 1)  Prilosec 20 Mg Cpdr (Omeprazole) .Marland Kitchen.. 1 By Mouth Once Daily 2)  Bayer Aspirin 325 Mg Tabs (Aspirin) .Marland Kitchen.. 1 A Day 3)  Nitroglycerin 0.4 Mg Subl (Nitroglycerin) .... One Tablet Under Tongue Every 5 Minutes As Needed For Chest Pain---May Repeat Times Three  Allergies (verified): No Known Drug Allergies  Past History:  Past Medical  History: GERD, with h/o stricture COPD, 04-2006: PFTs mild obstruction Peripheral vascular disease, iliac  (per cardiac cath 12/07) 2007 Cath: non obstructive CAD, mild MS, trivial AoS, small AAA Dyspnea 2007: w/u included a cath and PFTs, symptoms thought to be pulmonary R arm Bx : Bowen's disease, was refered to dermatology Hyperlipidemia Emphysema      - PFT 04/10 FVC 3.45 (90%), FEV1 2.72 (106%), TLC 5.36 (93%), DLCO 74%, no BD response      - ?of pseudonormalization of PFT findings with both obstructive and restrictive defects ABDOMINAL AORTIC ANEURYSM--per cardiac catheterization 3 --2010 AORTIC STENOSIS, MILD,  MITRAL STENOSIS  --her cardiac catheterization 3 --2010 CORONARY ARTERY DISEASE --mild, medical manage , per cardiac catheterization 3-- 2010 HYPERTENSION, PULMONARY   Family History: MI-- Father died at 67 of a myocardial infarction HTN--no DM--no prostate ca--no colon ca--no  Social History:  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   He enjoys watching sports.   He has been widowed for 12 years.   He has 2 grown sons.   He previously smoked more than 70 pack years but quit in 1988.  Denies significant alcohol use.   Denies illicit drug use.  Review  of Systems CV:  Denies chest pain or discomfort and swelling of feet; (+) claudication R leg. Resp:  Denies coughing up blood; mild cough some coughing  not on inhaler or nebulizer b/c they don't help. GI:  Denies diarrhea, nausea, and vomiting.  Physical Exam  General:  alert and well-developed.  NAD Lungs:  Normal respiratory effort, chest expands symmetrically. Lungs are clear to auscultation, no crackles or wheezes. decreased BS Heart:  normal rate, regular rhythm, no murmur, and no gallop.   Abdomen:  soft, non-tender, no distention, no masses, no guarding, and no rigidity.   Extremities:  no pretibial edema bilaterally    Impression & Recommendations:  Problem  # 1:  HEADACHE (ICD-784.0) rsolved   Problem # 2:  ABDOMINAL AORTIC ANEURYSM (ICD-441.4) last eval by a CT 01-2009 ---->3.3 cm   Problem # 3:  CORONARY ARTERY DISEASE (ICD-414.00) medical mangment, labs  Labs 11-10 at the ER: normal CBC , BMP, LFTs  His updated medication list for this problem includes:    Bayer Aspirin 325 Mg Tabs (Aspirin) .Marland Kitchen... 1 a day    Nitroglycerin 0.4 Mg Subl (Nitroglycerin) ..... One tablet under tongue every 5 minutes as needed for chest pain---may repeat times three  Problem # 4:  EMPHYSEMA (ICD-492.8) getting worse? not on any meds as they " don't Help" flu shot today  Problem # 5:  PERIPHERAL VASCULAR DISEASE (ICD-443.9) still symptomatic no pain at rest   Problem # 6:  F2F > 25 min , mostly due to chart review   Complete Medication List: 1)  Prilosec 20 Mg Cpdr (Omeprazole) .Marland Kitchen.. 1 by mouth once daily 2)  Bayer Aspirin 325 Mg Tabs (Aspirin) .Marland Kitchen.. 1 a day 3)  Nitroglycerin 0.4 Mg Subl (Nitroglycerin) .... One tablet under tongue every 5 minutes as needed for chest pain---may repeat times three  Other Orders: Admin 1st Vaccine (78295) Flu Vaccine 7yrs + (62130)  Patient Instructions: 1)  came back fasting: 2)  FLP--Dx CAD 3)  AST ALT  Dx high chol 4)  Please schedule a follow-up appointment in 6 months .

## 2010-05-02 NOTE — Letter (Signed)
Summary: [facility] Form/[place] [facility]  [facility] Options Form/[facility] [place]   Imported By: Lanelle Bal 04/06/2009 13:20:46  _____________________________________________________________________  External Attachment:    Type:   Image     Comment:   External Document

## 2010-05-02 NOTE — Miscellaneous (Signed)
Summary: Orders Update  Clinical Lists Changes  Orders: Added new Test order of Arterial Duplex Lower Extremity (Arterial Duplex Low) - Signed 

## 2010-05-02 NOTE — Progress Notes (Signed)
Summary: needs ov  ---- Converted from flag ---- ---- 03/10/2009 12:41 PM, Fernando E. Paz MD wrote: needs ROV, if he has not been seen in the last few months,  please arrange ------------------------------ patient has a appt on feb 4,2011.Marland KitchenMarland KitchenMarland KitchenBarb Merino  May 05, 2009 9:44 AM

## 2010-05-02 NOTE — Letter (Signed)
Summary: next carotid u/s 6 months---[facility]    [facility] Office Visit   Imported By: Roderic Ovens 01/24/2010 16:17:18  _____________________________________________________________________  External Attachment:    Type:   Image     Comment:   External Document

## 2010-05-02 NOTE — Miscellaneous (Signed)
Summary: Orders Update  Clinical Lists Changes  Orders: Added new Test order of Abdominal Aorta Duplex (Abd Aorta Duplex) - Signed 

## 2010-05-02 NOTE — Assessment & Plan Note (Signed)
Summary: post hosp, dizzy/swh   Vital Signs:  Patient profile:   77 year old male Height:      67 inches Weight:      216 pounds BMI:     33.95 Pulse rate:   75 / minute BP sitting:   160 / 60  Vitals Entered By: Kandice Hams (June 24, 2009 8:39 AM) CC: c/o dizziness fatigued   History of Present Illness:  DATE OF ADMISSION:  06/06/2009   DATE OF DISCHARGE:  06/08/2009      DISCHARGE DIAGNOSES:   1. Dizziness secondary to:       a.     Dehydration.       b.     Mild aortic stenosis and mild mitral stenosis which may not        be playing a significant role in this.       c.     Bilateral 60-80% internal carotid artery stenosis.   2. Bilateral carotid artery disease as mentioned above.   3. Not a hypertensive and not on antihypertensive medications.   4. Gastroesophageal reflux disease.   5. Nonobstructive coronary artery disease, but no current coronary       issues.   6. Chronic obstructive pulmonary disease.   7. Peripheral arterial disease.   8. Abdominal aortic aneurysm.   9. Dyslipidemia.   chart reviewed --creatinine 0.8, potassium 3.7, hemoglobin 14.1 --total protein 5.5 slightly decreased,  albumin 3.1 slightly decreased, calcium 8.2, slightly decreased -- TSH 3.0 -- d-dimer and 3 sets of cardiac enzymes negative -- portable chest x-ray question of an infiltrate, they recommended PA and lateral (not done) --CT of the head nonacute --carotid ultrasound 3/11   Summary:   60 to 80% right ICA stenosis(higher end of scale). 60 to 80% left   ICA stenosis.ECA stenosis bilaterally. Vertebrals are patent with   antegrade flow. --saw  the cardiovascular surgeons, they recommend a repeat carotid ultrasound 9/11 --was started on zetia  Allergies: No Known Drug Allergies  Past History:  Past Medical History: GERD, with h/o stricture Peripheral vascular disease, iliac  (per cardiac cath 12/07) Carotid Artery Dz , last u/s 3-11, next 6 months , per vasc. surgery    2007 Cath: non obstructive CAD, mild MS, trivial AoS, small AAA Dyspnea 2007: w/u included a cath and PFTs, symptoms thought to be pulmonary R arm Bx : Bowen's disease, was refered to dermatology Hyperlipidemia Emphysema      - PFT 04/10 FVC 3.45 (90%), FEV1 2.72 (106%), TLC 5.36 (93%), DLCO 74%, no BD response      - ?of pseudonormalization of PFT findings with both obstructive and restrictive defects ABDOMINAL AORTIC ANEURYSM--per cardiac catheterization 3 --2010 AORTIC STENOSIS, MILD,  MITRAL STENOSIS  --her cardiac catheterization 3 --2010 CORONARY ARTERY DISEASE --mild, medical manage , per cardiac catheterization 3-- 2010 HYPERTENSION, PULMONARY   Past Surgical History: Reviewed history from 06/29/2008 and no changes required. Appendectomy esophageal dilatation  coronary angiography, abdominal aortography-02-27-06 Dr. Samule Ohm  Social History: Reviewed history from 05/06/2009 and no changes required.  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   He enjoys watching sports.   He has been widowed for 12 years.   He has 2 grown sons.   He previously smoked more than 70 pack years but quit in 1988.  Denies significant alcohol use.   Denies illicit drug use.  Review of Systems       since he  left the hospital he is improving but not completely well still weak, still slightly dizzy denies fevers + cough with occasional sputum shortness of breath apparently at baseline.  No chest pain no further nausea abdominal pain.  Appetite is normal with good p.o. tolerance  Physical Exam  General:  alert and well-developed.   Lungs:  normal respiratory effort, no intercostal retractions, and no accessory muscle use.  slightly  decreased BS Heart:  RRR Abdomen:  soft, non-tender, no distention, and no masses.   Psych:  Oriented X3, memory intact for recent and remote, normally interactive, good eye contact, not anxious appearing, and not depressed  appearing.     Impression & Recommendations:  Problem # 1:  assessment and plan --dizziness, nausea, hypotension resolving,  not feeling completely well but anticipate that he will be back to normal in few days.  symptoms likely due to a GI infection    Problem # 2:  EMPHYSEMA (ICD-492.8) abnormal chest x-ray, see HPI, will get a PA and lateral    Problem # 3:  ORTHOSTATIC HYPOTENSION (ICD-458.0) see #1   Problem # 4:  DIZZINESS (ICD-780.4) see #1  Orders: Hgb (85018) Glucose, (CBG) (16109) Fingerstick (60454)  Problem # 5:  CAROTID ARTERY DISEASE (ICD-433.10)  carotid disease, an ultrasound two days ago, see HPI.  Per cardiovascular surgery, they will repeat a carotid ultrasound in 6 months  His updated medication list for this problem includes:    Bayer Aspirin 325 Mg Tabs (Aspirin) .Marland Kitchen... 1 a day  Problem # 6:  HYPERCHOLESTEROLEMIA (ICD-272.0) high cholesterol, now on zetia   His updated medication list for this problem includes:    Zetia 10 Mg Tabs (Ezetimibe) .Marland Kitchen... 1 a day  Complete Medication List: 1)  Prilosec 20 Mg Cpdr (Omeprazole) .Marland Kitchen.. 1 by mouth once daily 2)  Bayer Aspirin 325 Mg Tabs (Aspirin) .Marland Kitchen.. 1 a day 3)  Nitroglycerin 0.4 Mg Subl (Nitroglycerin) .... One tablet under tongue every 5 minutes as needed for chest pain---may repeat times three 4)  Zetia 10 Mg Tabs (Ezetimibe) .Marland Kitchen.. 1 a day  Other Orders: T-2 View CXR (71020TC)  Patient Instructions: 1)  Please schedule a follow-up appointment in 3 months .   Laboratory Results   Blood Tests     Glucose (random): 103 mg/dL   (Normal Range: 09-811)   CBC   HGB:  16.3 g/dL   (Normal Range: 91.4-78.2 in Males, 12.0-15.0 in Females)

## 2010-05-31 ENCOUNTER — Encounter: Payer: Self-pay | Admitting: Internal Medicine

## 2010-05-31 ENCOUNTER — Ambulatory Visit (INDEPENDENT_AMBULATORY_CARE_PROVIDER_SITE_OTHER): Payer: Medicare Other | Admitting: Internal Medicine

## 2010-05-31 DIAGNOSIS — J438 Other emphysema: Secondary | ICD-10-CM

## 2010-06-08 NOTE — Assessment & Plan Note (Signed)
Summary: SINUS INF///SPH   Vital Signs:  Patient profile:   77 year old male Height:      67 inches Weight:      222 pounds BMI:     34.90 Temp:     98.6 degrees F oral Pulse rate:   77 / minute Pulse rhythm:   regular BP sitting:   122 / 86  (left arm) Cuff size:   large  Vitals Entered By: Army Fossa CMA (May 31, 2010 10:44 AM) CC: Pt here c/o sinus infection?  Comments Pressure in face head cold x 2-3 days Coughing x 1 week Not tried any OTC Meds [facility] [address]   History of Present Illness:   started with cough for the last week, more than usual. Also some sinus pressure for 2 or 3 days.  has not taken any meds  over-the-counter  ROS  no fever No nausea, vomiting or diarrhea No myalgias  shortness of breath is slightly >  baseline Mild wheezing noted  per patient. very mild sputum production.  Current Medications (verified): 1)  Prilosec 20 Mg Cpdr (Omeprazole) .Marland Kitchen.. 1 By Mouth Once Daily 2)  Bayer Aspirin 325 Mg Tabs (Aspirin) .Marland Kitchen.. 1 A Day 3)  Nitroglycerin 0.4 Mg Subl (Nitroglycerin) .... One Tablet Under Tongue Every 5 Minutes As Needed For Chest Pain---May Repeat Times Three  Allergies (verified): No Known Drug Allergies  Past History:  Past Medical History: Reviewed history from 06/24/2009 and no changes required. GERD, with h/o stricture Peripheral vascular disease, iliac  (per cardiac cath 12/07) Carotid Artery Dz , last u/s 3-11, next 6 months , per vasc. surgery  2007 Cath: non obstructive CAD, mild MS, trivial AoS, small AAA Dyspnea 2007: w/u included a cath and PFTs, symptoms thought to be pulmonary R arm Bx : Bowen's disease, was refered to dermatology Hyperlipidemia Emphysema      - PFT 04/10 FVC 3.45 (90%), FEV1 2.72 (106%), TLC 5.36 (93%), DLCO 74%, no BD response      - ?of pseudonormalization of PFT findings with both obstructive and restrictive defects ABDOMINAL AORTIC ANEURYSM--per cardiac catheterization 3 --2010 AORTIC  STENOSIS, MILD,  MITRAL STENOSIS  --her cardiac catheterization 3 --2010 CORONARY ARTERY DISEASE --mild, medical manage , per cardiac catheterization 3-- 2010 HYPERTENSION, PULMONARY   Past Surgical History: Reviewed history from 06/29/2008 and no changes required. Appendectomy esophageal dilatation  coronary angiography, abdominal aortography-02-27-06 Dr. Samule Ohm  Social History: Reviewed history from 05/06/2009 and no changes required.  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   He enjoys watching sports.   He has been widowed for 12 years.   He has 2 grown sons.   He previously smoked more than 70 pack years but quit in 1988.  Denies significant alcohol use.   Denies illicit drug use.  Physical Exam  General:  alert and well-developed.   no apparent distress Head:   face symmetric Ears:  R ear normal and L ear normal.   Nose:   slightly congested Mouth:   no redness or discharge Lungs:  normal respiratory effort, no intercostal retractions, and no accessory muscle use.  slightly  decreased BS , few rhonchi with cough, no wheezing Heart:  RRR , he has a systolic murmur   Impression & Recommendations:  Problem # 1:  EMPHYSEMA (ICD-492.8) mild COPD exhacerbation, see instructions   Complete Medication List: 1)  Prilosec 20 Mg Cpdr (Omeprazole) .Marland Kitchen.. 1 by mouth once daily  2)  Bayer Aspirin 325 Mg Tabs (Aspirin) .Marland Kitchen.. 1 a day 3)  Nitroglycerin 0.4 Mg Subl (Nitroglycerin) .... One tablet under tongue every 5 minutes as needed for chest pain---may repeat times three 4)  Doxycycline Hyclate 100 Mg Caps (Doxycycline hyclate) .... One tablet twice a day for one week  Patient Instructions: 1)   rest, fluids, take Tylenol 2)  for cough, use Mucinex DM over-the-counter twice a day. 3)  Doxycycline, and antibiotic, for one week 4)  Call if not better next week 5)  Call anytime if the symptoms are severe 6)   please schedule a physical exam  at your earlier convenience, come back fasting Prescriptions: DOXYCYCLINE HYCLATE 100 MG CAPS (DOXYCYCLINE HYCLATE) one tablet twice a day for one week  # 14 x 0   Entered and Authorized by:   Nolon Rod. Alexandar Weisenberger MD   Signed by:   Nolon Rod. Selassie Spatafore MD on 05/31/2010   Method used:   Print then Give to Patient   RxID:   [id number]    Orders Added: 1)  Est. Patient Level III [56213]

## 2010-06-23 LAB — COMPREHENSIVE METABOLIC PANEL
ALT: 13 U/L (ref 0–53)
AST: 23 U/L (ref 0–37)
CO2: 26 mEq/L (ref 19–32)
Chloride: 102 mEq/L (ref 96–112)
Creatinine, Ser: 1.09 mg/dL (ref 0.4–1.5)
GFR calc Af Amer: >60 mL/min (ref >60)
GFR calc non Af Amer: >60 mL/min (ref >60)
Total Bilirubin: 2.2 mg/dL — ABNORMAL HIGH (ref 0.3–1.2)

## 2010-06-23 LAB — CBC
HCT: 41.1 % (ref 39.0–52.0)
Hemoglobin: 14.1 g/dL (ref 13.0–17.0)
MCHC: 34.4 g/dL (ref 30.0–36.0)
Platelets: 203 10*3/uL (ref 150–400)
RBC: 5.32 MIL/uL (ref 4.22–5.81)
RDW: 14.6 % (ref 11.5–15.5)
RDW: 15 % (ref 11.5–15.5)
WBC: 9.9 10*3/uL (ref 4.0–10.5)

## 2010-06-23 LAB — D-DIMER, QUANTITATIVE: D-Dimer, Quant: 0.42 ug/mL-FEU (ref 0.00–0.48)

## 2010-06-23 LAB — CARDIAC PANEL(CRET KIN+CKTOT+MB+TROPI)
Relative Index: INVALID (ref 0.0–2.5)
Troponin I: 0.03 ng/mL (ref 0.00–0.06)

## 2010-06-23 LAB — BASIC METABOLIC PANEL
BUN: 12 mg/dL (ref 6–23)
Calcium: 8.1 mg/dL — ABNORMAL LOW (ref 8.4–10.5)
GFR calc non Af Amer: >60 mL/min (ref >60)
Glucose, Bld: 120 mg/dL — ABNORMAL HIGH (ref 70–99)

## 2010-07-05 LAB — DIFFERENTIAL
Band Neutrophils: 0 % (ref 0–10)
Blasts: 0 %
Lymphocytes Relative: 10 % — ABNORMAL LOW (ref 12–46)
Lymphs Abs: 1.2 10*3/uL (ref 0.7–4.0)
Monocytes Absolute: 0.5 10*3/uL (ref 0.1–1.0)
Monocytes Relative: 4 % (ref 3–12)
Neutro Abs: 9.9 10*3/uL — ABNORMAL HIGH (ref 1.7–7.7)
Neutrophils Relative %: 86 % — ABNORMAL HIGH (ref 43–77)

## 2010-07-05 LAB — CBC
HCT: 46.5 % (ref 39.0–52.0)
MCV: 88.7 fL (ref 78.0–100.0)
Platelets: 277 10*3/uL (ref 150–400)
RDW: 13.4 % (ref 11.5–15.5)

## 2010-07-05 LAB — POCT CARDIAC MARKERS
CKMB, poc: 1.9 ng/mL (ref 1.0–8.0)
CKMB, poc: <1 ng/mL — ABNORMAL LOW (ref 1.0–8.0)
Myoglobin, poc: 161 ng/mL (ref 12–200)
Myoglobin, poc: 221 ng/mL (ref 12–200)
Troponin i, poc: <0.05 ng/mL (ref 0.00–0.09)

## 2010-07-05 LAB — COMPREHENSIVE METABOLIC PANEL
Albumin: 4.5 g/dL (ref 3.5–5.2)
BUN: 12 mg/dL (ref 6–23)
Chloride: 103 mEq/L (ref 96–112)
Creatinine, Ser: 0.9 mg/dL (ref 0.4–1.5)
Total Bilirubin: 1.5 mg/dL — ABNORMAL HIGH (ref 0.3–1.2)
Total Protein: 7.8 g/dL (ref 6.0–8.3)

## 2010-07-13 LAB — POCT I-STAT 3, ART BLOOD GAS (G3+)
Bicarbonate: 23.4 mEq/L (ref 20.0–24.0)
O2 Saturation: 91 %
TCO2: 25 mmol/L (ref 0–100)
pCO2 arterial: 36.7 mmHg (ref 35.0–45.0)
pH, Arterial: 7.413 (ref 7.350–7.450)
pO2, Arterial: 60 mmHg — ABNORMAL LOW (ref 80.0–100.0)

## 2010-07-13 LAB — POCT I-STAT 3, VENOUS BLOOD GAS (G3P V)
Acid-Base Excess: 1 mmol/L (ref 0.0–2.0)
Bicarbonate: 24.1 mEq/L — ABNORMAL HIGH (ref 20.0–24.0)
O2 Saturation: 71 %
O2 Saturation: 71 %
TCO2: 25 mmol/L (ref 0–100)

## 2010-07-19 ENCOUNTER — Ambulatory Visit: Payer: Self-pay | Admitting: Vascular Surgery

## 2010-07-19 ENCOUNTER — Other Ambulatory Visit (INDEPENDENT_AMBULATORY_CARE_PROVIDER_SITE_OTHER): Payer: Medicare Other

## 2010-07-19 ENCOUNTER — Ambulatory Visit (INDEPENDENT_AMBULATORY_CARE_PROVIDER_SITE_OTHER): Payer: Medicare Other | Admitting: Vascular Surgery

## 2010-07-19 ENCOUNTER — Other Ambulatory Visit: Payer: Self-pay

## 2010-07-19 DIAGNOSIS — I6529 Occlusion and stenosis of unspecified carotid artery: Secondary | ICD-10-CM

## 2010-07-20 NOTE — Procedures (Unsigned)
CAROTID DUPLEX EXAM  INDICATION:  Follow up carotid stenosis.  HISTORY: Diabetes:  No. Cardiac:  No Hypertension:  No Smoking:  Previous Previous Surgery:  No CV History:  Dizziness present, otherwise asymptomatic Amaurosis Fugax No, Paresthesias No, Hemiparesis No                                      RIGHT             LEFT Brachial systolic pressure:         162               132 Brachial Doppler waveforms:         triphasic         biphasic Vertebral direction of flow:        antegrade         retrograde DUPLEX VELOCITIES (cm/sec) CCA peak systolic                   96                119 ECA peak systolic                   312               262 ICA peak systolic                   511               189 ICA end diastolic                   132               47 PLAQUE MORPHOLOGY:                  heterogeneous     heterogeneous PLAQUE AMOUNT:                      severe            moderate PLAQUE LOCATION:                    CCA, ICA, ECA     CCA, ICA, ECA  IMPRESSION: 1. Hemodynamically significant stenosis present in the right internal     carotid artery of 80% to 99% range. 2. Left internal carotid artery stenosis in the 40% to 59% range. 3. Bilateral external carotid artery stenosis is present. 4. Left vertebral is retrograde with significant difference in     brachial pressures, suggestive of left subclavian steal syndrome. 5. Right-sided stenosis has increased since previous study on     01/04/2010.  The left-sided stenosis is unchanged.  ___________________________________________ Fernando Moyer. Fernando Moyer, M.D.  SH/MEDQ  D:  07/19/2010  T:  07/19/2010  Job:  [id number]

## 2010-07-20 NOTE — H&P (Signed)
HISTORY AND PHYSICAL EXAMINATION  July 19, 2010  Re:  Fernando Moyer, Fernando Moyer              DOB:  11-Sep-1933  REASON FOR ADMISSION:  Critical right carotid stenosis.  Date of tentative admission:  08/01/2010.  HISTORY:  This is a pleasant 77 -year-old gentleman who I had originally seen as a consultation in the hospital in March 2011.  He had been admitted with dizziness and was found to have bilateral carotid disease. He had bilateral 60% to 79%  carotid stenosis at that time and I recommended a follow-up duplex scan in 6 months.  He understood  we would consider carotid endarterectomy if the stenosis progressed to greater than 80%.  In October 2011, his follow-up duplex scan showed, again bilateral 60%-79% stenoses.  He came in for a follow-up study on 07/19/2010 and the stenosis on the right had progressed to greater than 80%.  He remains asymptomatic and specifically denies any history of stroke, TIAs, expressive or receptive aphasia or amaurosis fugax.  He has had no recent problems with dizziness.  His only complaint has been some low back pain which has been stable and is sometimes associated with paresthesias in his legs.  PAST MEDICAL HISTORY:  Significant for nonobstructive coronary artery disease.  He has had previous catheterization and is followed by Dr. Excell Seltzer.  He also has a history of COPD, hypertension and hyperlipidemia. He has had no previous history of myocardial infarction or history of congestive heart failure.  He denies any history of diabetes.  FAMILY HISTORY:  He is unaware of any history of premature cardiovascular disease.  SOCIAL HISTORY:  He is widowed.  He has 2 children.  He quit tobacco in 1989.  ALLERGIES:  No known drug allergies.  MEDICATIONS: 1. Prevacid OTC 1 p.o. daily p.r.n. 2. Aspirin 81 mg p.o. daily. 3. Nitroglycerin  0.4 mg sublingual p.r.n.  REVIEW OF SYSTEMS:  GENERAL:  He has had no recent weight loss,  weight gain or problems with his appetite. CARDIOVASCULAR:  He had no chest pain, chest pressure, palpitations or arrhythmias.  He does admit to dyspnea on exertion.  He has had no history of DVT or phlebitis. GI:  He does have a history of reflux. NEUROLOGIC:  He has had no dizziness, blackouts, headaches or seizures. PULMONARY:  He had no productive cough or bronchitis recently. HEMATOLOGIC:  He has had no bleeding problems or clotting disorders. GU, ENT, musculoskeletal, psychiatric, integumentary review of systems is unremarkable.  PHYSICAL EXAMINATION:  This is a pleasant 77 year old gentleman who appears his stated age.  Blood pressure is 158/67 in the right arm, 122/73 in the left arm, heart rate is 81, respiratory rate is 12. HEENT: Unremarkable.  Lungs: Clear bilaterally to auscultation without rales, rhonchi or wheezing.  Cardiovascular exam:  He has bilateral carotid bruits.  He has a fairly significant systolic ejection murmur. I cannot really palpate a right femoral pulse.  He has a barely palpable left femoral pulse.  I cannot palpate pedal pulses; however, his feet appear adequately perfused.  Abdomen:  Soft and nontender with normal pitched bowel sounds.  It is difficult to assess for an aneurysm because of the size; however, he apparently has a small aneurysm which has been followed by cardiology.  Neurologic:  He has no focal weakness.  Skin: There are no ulcers or rashes.  I have independently interpreted his carotid duplex scan which shows a greater than 80% right carotid stenosis which is  fairly tight.  Peak systolic velocity is 511 cm/sec and end-diastolic velocity of 132 cm/sec.  On the left side, he has a  40%-59% stenosis in the higher end of that range.  He also is noted to have retrograde flow in his left vertebral artery.  Given the progression of the stenosis of the right carotid, I have recommended right carotid endarterectomy in order to lower his risk  of future stroke.  We have discussed the indications for the procedure and potential complications including but not limited to bleeding, stroke (periprocedural risk of 1% to 2%), nerve injury, MI, or other unpredictable medical problems.  Given that his systolic murmur and some dyspnea on exertion, I will try to get him in to Dr. Excell Seltzer for preoperative cardiac evaluation prior to scheduling his surgery.  We tentatively have him on the schedule for May 1; however, we can very easily change this if he requires any further cardiac workup.  He does know to continue taking his aspirin.    Di Kindle. Edilia Bo, M.D. Electronically Signed  CSD/MEDQ  D:  07/19/2010  T:  07/20/2010  Job:  [id number]  cc:   Veverly Fells. Excell Seltzer, MD Willow Ora, MD

## 2010-07-22 ENCOUNTER — Encounter: Payer: Self-pay | Admitting: Cardiovascular Disease

## 2010-07-25 ENCOUNTER — Encounter: Payer: Self-pay | Admitting: Cardiovascular Disease

## 2010-07-25 ENCOUNTER — Telehealth: Payer: Self-pay | Admitting: Cardiovascular Disease

## 2010-07-25 ENCOUNTER — Ambulatory Visit (INDEPENDENT_AMBULATORY_CARE_PROVIDER_SITE_OTHER): Payer: Medicare Other | Admitting: Cardiovascular Disease

## 2010-07-25 DIAGNOSIS — I739 Peripheral vascular disease, unspecified: Secondary | ICD-10-CM

## 2010-07-25 DIAGNOSIS — I359 Nonrheumatic aortic valve disorder, unspecified: Secondary | ICD-10-CM

## 2010-07-25 DIAGNOSIS — E785 Hyperlipidemia, unspecified: Secondary | ICD-10-CM

## 2010-07-25 DIAGNOSIS — I2510 Atherosclerotic heart disease of native coronary artery without angina pectoris: Secondary | ICD-10-CM

## 2010-07-25 DIAGNOSIS — E7800 Pure hypercholesterolemia, unspecified: Secondary | ICD-10-CM

## 2010-07-25 MED ORDER — PRAVASTATIN SODIUM 40 MG PO TABS: 40.0000 mg | ORAL_TABLET | Freq: Every day | ORAL | Status: DC

## 2010-07-25 NOTE — Telephone Encounter (Signed)
Efraim Kaufmann is the pt's emergency contact and Healthcare POA.  I made her aware of the testing that Dr Excell Seltzer had ordered at the pt's appointment.

## 2010-07-25 NOTE — Patient Instructions (Signed)
Your physician has requested that you have an echocardiogram. Echocardiography is a painless test that uses sound waves to create images of your heart. It provides your doctor with information about the size and shape of your heart and how well your heart's chambers and valves are working. This procedure takes approximately one hour. There are no restrictions for this procedure.  Your physician has recommended you make the following change in your medication: START Pravastatin 40mg  take one by mouth at bedtime  Your physician recommends that you return for a FASTING lipid and liver profile in 3 MONTHS (414.01, 440.21)  Your physician wants you to follow-up in: 6 MONTHS.  You will receive a reminder letter in the mail two months in advance. If you don't receive a letter, please call our office to schedule the follow-up appointment.

## 2010-07-27 ENCOUNTER — Ambulatory Visit (HOSPITAL_COMMUNITY): Payer: Medicare Other | Attending: Cardiovascular Disease | Admitting: Radiology

## 2010-07-27 DIAGNOSIS — E669 Obesity, unspecified: Secondary | ICD-10-CM | POA: Insufficient documentation

## 2010-07-27 DIAGNOSIS — R0989 Other specified symptoms and signs involving the circulatory and respiratory systems: Secondary | ICD-10-CM | POA: Insufficient documentation

## 2010-07-27 DIAGNOSIS — I2510 Atherosclerotic heart disease of native coronary artery without angina pectoris: Secondary | ICD-10-CM

## 2010-07-27 DIAGNOSIS — I059 Rheumatic mitral valve disease, unspecified: Secondary | ICD-10-CM | POA: Insufficient documentation

## 2010-07-27 DIAGNOSIS — E785 Hyperlipidemia, unspecified: Secondary | ICD-10-CM | POA: Insufficient documentation

## 2010-07-27 DIAGNOSIS — I359 Nonrheumatic aortic valve disorder, unspecified: Secondary | ICD-10-CM | POA: Insufficient documentation

## 2010-07-27 DIAGNOSIS — J438 Other emphysema: Secondary | ICD-10-CM | POA: Insufficient documentation

## 2010-07-27 DIAGNOSIS — R0609 Other forms of dyspnea: Secondary | ICD-10-CM | POA: Insufficient documentation

## 2010-07-28 ENCOUNTER — Telehealth: Payer: Self-pay | Admitting: Cardiovascular Disease

## 2010-07-28 NOTE — Telephone Encounter (Signed)
Dr office calling re sur clearance for pt surgery on 08/01/10 for right carotid surgery.

## 2010-07-31 ENCOUNTER — Encounter (HOSPITAL_COMMUNITY)
Admission: RE | Admit: 2010-07-31 | Discharge: 2010-07-31 | Disposition: A | Discharge status: Home / Self Care | Payer: Medicare Other | Source: Ambulatory Visit | Attending: Vascular Surgery | Admitting: Vascular Surgery

## 2010-07-31 ENCOUNTER — Encounter: Payer: Self-pay | Admitting: Cardiovascular Disease

## 2010-07-31 ENCOUNTER — Other Ambulatory Visit: Payer: Self-pay | Admitting: Vascular Surgery

## 2010-07-31 ENCOUNTER — Ambulatory Visit (HOSPITAL_COMMUNITY)
Admission: RE | Admit: 2010-07-31 | Discharge: 2010-07-31 | Disposition: A | Discharge status: Home / Self Care | Payer: Medicare Other | Source: Ambulatory Visit | Attending: Vascular Surgery | Admitting: Vascular Surgery

## 2010-07-31 DIAGNOSIS — Z01812 Encounter for preprocedural laboratory examination: Secondary | ICD-10-CM | POA: Insufficient documentation

## 2010-07-31 DIAGNOSIS — Z01811 Encounter for preprocedural respiratory examination: Secondary | ICD-10-CM

## 2010-07-31 DIAGNOSIS — J438 Other emphysema: Secondary | ICD-10-CM | POA: Insufficient documentation

## 2010-07-31 DIAGNOSIS — Z01818 Encounter for other preprocedural examination: Secondary | ICD-10-CM | POA: Insufficient documentation

## 2010-07-31 LAB — CBC
Hemoglobin: 16.2 g/dL (ref 13.0–17.0)
MCH: 29.8 pg (ref 26.0–34.0)
MCV: 85.5 fL (ref 78.0–100.0)
RBC: 5.43 MIL/uL (ref 4.22–5.81)

## 2010-07-31 LAB — URINALYSIS, ROUTINE W REFLEX MICROSCOPIC
Bilirubin Urine: NEGATIVE
Glucose, UA: NEGATIVE mg/dL
Ketones, ur: NEGATIVE mg/dL
Specific Gravity, Urine: 1.017 (ref 1.005–1.030)
pH: 6 (ref 5.0–8.0)

## 2010-07-31 LAB — COMPREHENSIVE METABOLIC PANEL
AST: 19 U/L (ref 0–37)
Albumin: 3.8 g/dL (ref 3.5–5.2)
Chloride: 104 mEq/L (ref 96–112)
Creatinine, Ser: 1.01 mg/dL (ref 0.4–1.5)
GFR calc Af Amer: >60 mL/min (ref >60)
Potassium: 4.6 mEq/L (ref 3.5–5.1)
Total Bilirubin: 1.4 mg/dL — ABNORMAL HIGH (ref 0.3–1.2)
Total Protein: 6.4 g/dL (ref 6.0–8.3)

## 2010-07-31 LAB — TYPE AND SCREEN: Antibody Screen: NEGATIVE

## 2010-07-31 LAB — PROTIME-INR: INR: 0.89 (ref 0.00–1.49)

## 2010-07-31 LAB — APTT: aPTT: 30 seconds (ref 24–37)

## 2010-07-31 LAB — SURGICAL PCR SCREEN: MRSA, PCR: NEGATIVE

## 2010-07-31 NOTE — Assessment & Plan Note (Signed)
Stable right leg pain with ambulation secondary to iliac occlusion. Symptoms are nonprogressive.

## 2010-07-31 NOTE — Assessment & Plan Note (Signed)
Cardiac cath results reviewed from 2010...he had 50% stenosis of the LAD and RCA. No high-grade stenosis was present. He has no angina and should be able to proceed with surgery at low-risk of MI. No further testing is indicated as he reports no change in symptoms over the preceding 2 years.

## 2010-07-31 NOTE — Progress Notes (Signed)
HPI:  This is a 77 year-old male presenting for preoperative evaluation. I have followed him for several years. 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 He has developed progressive right carotid stenosis and is scheduled for carotid endarterectomy next week.  The patient complains of chronic fatigue and dyspnea. These symptoms are unchanged over time. He has no dyspnea at rest. He denies chest pain or pressure. No edema, lightheadedness, palpitations, or syncope. He has had no focal neurologic symptoms or amaurosis.  Outpatient Encounter Prescriptions as of 07/25/2010  Medication Sig Dispense Refill  . omeprazole (PRILOSEC) 20 MG capsule Take 20 mg by mouth daily.        Marland Kitchen aspirin EC 81 MG EC tablet Take 1 tablet (81 mg total) by mouth daily.      . pravastatin (PRAVACHOL) 40 MG tablet Take 1 tablet (40 mg total) by mouth daily.  30 tablet  11  . DISCONTD: aspirin 325 MG EC tablet Take 325 mg by mouth daily.       Marland Kitchen DISCONTD: doxycycline (VIBRAMYCIN) 100 MG capsule Take 100 mg by mouth 2 (two) times daily. Take this for 1 week       . DISCONTD: nitroGLYCERIN (NITROSTAT) 0.4 MG SL tablet Place 0.4 mg under the tongue every 5 (five) minutes as needed.          No Known Allergies  Past Medical History  Diagnosis Date  . GERD with stricture     w/hx of stricture  . PVD (peripheral vascular disease) 12/07    per cath 12/07....iliac  . Carotid artery occlusion     last u/s 3-11...Marland Kitchenper vasc.surgery 2007 cath: nonobstructive CAD. mil;d MS, trivial AoS, small AAA,  . Dyspnea      w/u included a cath and PFT's, sxms thought to be pulmonary  . Bowen's disease     right arm BX: referred to dermatology  . Hyperlipidemia   . Emphysema     PFT 4/10 FVC 3.45 (90%), FEV1 2.72 (106%), TLC 5.36 (93%), DLCO 74%, NO BD RESPONSE: ? OF PSEUDONORMALIZATION OF PFT  FINDINGS W/BOTH OBSTRUCTIVE AND RESTRICTIVE DEFECTS.  Marland Kitchen AAA (abdominal aortic aneurysm) 05/2008    PER CARDIAC CATH  . Aortic stenosis 05/2008    PER CARDIAC CATH, MILD, MITRAL STENOSIS  . Mitral stenosis 05/2008    PER CARDIAC CATH  . Coronary artery disease 05/2008    MILD, MEDICAL MANAGEMENT  . Pulmonary hypertension     ROS: Negative except as per HPI  BP 118/80  Pulse 92  Ht 5\' 7"  (1.702 m)  Wt 220 lb (99.791 kg)  BMI 34.46 kg/m2  PHYSICAL EXAM: Pt is alert and oriented, elderly, overweight male in NAD HEENT: normal Neck: JVP - normal, carotids 2+= with bilateral bruits Lungs: CTA bilaterally CV: RRR with 3/6 systolic murmur at the RUSB Abd: soft, NT, Positive BS, no hepatomegaly Ext: no C/C/E, distal pulses intact and equal Skin: warm/dry no rash   EKG:  NSR with RBBB/LAFB (bifascicular block)  ASSESSMENT AND PLAN:

## 2010-07-31 NOTE — Assessment & Plan Note (Signed)
The patient is off of statin Rx. With CAD and peripheral disease, recommend start pravastatin 40 mg daily and recheck lipids in 12 weeks.

## 2010-07-31 NOTE — Telephone Encounter (Signed)
This message was sent to my in basket on 07/28/10 (this was my scheduled day off).  I spoke with Darel Hong and will fax 07/25/10 OV and 07/27/10 Echo to [phone].

## 2010-07-31 NOTE — Assessment & Plan Note (Signed)
An echocardiogram was performed to eval his aortic stenosis. This showed vigorous LV function with LVEF 65-70%. His peak and mean aortic valve gradients are 56 and 35, respectively. These parameters are in the range of moderate AS and we will continue with observation. This increases his surgical risk to a small degreee but without severe AS I would not change recommendations.

## 2010-08-01 ENCOUNTER — Inpatient Hospital Stay (HOSPITAL_COMMUNITY)
Admission: RE | Admit: 2010-08-01 | Discharge: 2010-08-03 | DRG: 038 | Disposition: A | Discharge status: Home / Self Care | Payer: Medicare Other | Source: Ambulatory Visit | Attending: Vascular Surgery | Admitting: Vascular Surgery

## 2010-08-01 ENCOUNTER — Other Ambulatory Visit: Payer: Self-pay | Admitting: Vascular Surgery

## 2010-08-01 DIAGNOSIS — I2510 Atherosclerotic heart disease of native coronary artery without angina pectoris: Secondary | ICD-10-CM | POA: Diagnosis present

## 2010-08-01 DIAGNOSIS — I10 Essential (primary) hypertension: Secondary | ICD-10-CM | POA: Diagnosis present

## 2010-08-01 DIAGNOSIS — I6529 Occlusion and stenosis of unspecified carotid artery: Principal | ICD-10-CM | POA: Diagnosis present

## 2010-08-01 DIAGNOSIS — I708 Atherosclerosis of other arteries: Secondary | ICD-10-CM | POA: Diagnosis present

## 2010-08-01 DIAGNOSIS — E669 Obesity, unspecified: Secondary | ICD-10-CM | POA: Diagnosis present

## 2010-08-01 DIAGNOSIS — J449 Chronic obstructive pulmonary disease, unspecified: Secondary | ICD-10-CM | POA: Diagnosis present

## 2010-08-01 DIAGNOSIS — Z87891 Personal history of nicotine dependence: Secondary | ICD-10-CM

## 2010-08-01 DIAGNOSIS — I658 Occlusion and stenosis of other precerebral arteries: Secondary | ICD-10-CM | POA: Diagnosis present

## 2010-08-01 DIAGNOSIS — Z79899 Other long term (current) drug therapy: Secondary | ICD-10-CM

## 2010-08-01 DIAGNOSIS — K219 Gastro-esophageal reflux disease without esophagitis: Secondary | ICD-10-CM | POA: Diagnosis present

## 2010-08-01 DIAGNOSIS — I5030 Unspecified diastolic (congestive) heart failure: Secondary | ICD-10-CM | POA: Diagnosis not present

## 2010-08-01 DIAGNOSIS — I714 Abdominal aortic aneurysm, without rupture, unspecified: Secondary | ICD-10-CM | POA: Diagnosis present

## 2010-08-01 DIAGNOSIS — I959 Hypotension, unspecified: Secondary | ICD-10-CM

## 2010-08-01 DIAGNOSIS — I9589 Other hypotension: Secondary | ICD-10-CM | POA: Diagnosis not present

## 2010-08-01 DIAGNOSIS — R0902 Hypoxemia: Secondary | ICD-10-CM | POA: Diagnosis not present

## 2010-08-01 DIAGNOSIS — J4489 Other specified chronic obstructive pulmonary disease: Secondary | ICD-10-CM | POA: Diagnosis present

## 2010-08-01 DIAGNOSIS — I509 Heart failure, unspecified: Secondary | ICD-10-CM | POA: Diagnosis present

## 2010-08-01 DIAGNOSIS — E785 Hyperlipidemia, unspecified: Secondary | ICD-10-CM | POA: Diagnosis present

## 2010-08-01 DIAGNOSIS — I5032 Chronic diastolic (congestive) heart failure: Secondary | ICD-10-CM | POA: Diagnosis present

## 2010-08-01 DIAGNOSIS — I359 Nonrheumatic aortic valve disorder, unspecified: Secondary | ICD-10-CM | POA: Diagnosis present

## 2010-08-01 DIAGNOSIS — Z7982 Long term (current) use of aspirin: Secondary | ICD-10-CM

## 2010-08-01 DIAGNOSIS — Z8249 Family history of ischemic heart disease and other diseases of the circulatory system: Secondary | ICD-10-CM

## 2010-08-01 HISTORY — PX: ENDARTERECTOMY: SHX5162

## 2010-08-01 LAB — BLOOD GAS, ARTERIAL
Acid-base deficit: 0.4 mmol/L (ref 0.0–2.0)
Drawn by: 31297
O2 Content: 3 L/min
O2 Saturation: 86.1 %
Patient temperature: 98.6
pO2, Arterial: 52.8 mmHg — ABNORMAL LOW (ref 80.0–100.0)

## 2010-08-02 ENCOUNTER — Other Ambulatory Visit: Payer: Self-pay

## 2010-08-02 ENCOUNTER — Ambulatory Visit: Payer: Self-pay | Admitting: Vascular Surgery

## 2010-08-02 DIAGNOSIS — I5033 Acute on chronic diastolic (congestive) heart failure: Secondary | ICD-10-CM

## 2010-08-02 LAB — BASIC METABOLIC PANEL
CO2: 26 mEq/L (ref 19–32)
Calcium: 8.7 mg/dL (ref 8.4–10.5)
Creatinine, Ser: 0.87 mg/dL (ref 0.4–1.5)
GFR calc Af Amer: >60 mL/min (ref >60)
GFR calc non Af Amer: >60 mL/min (ref >60)
Sodium: 136 mEq/L (ref 135–145)

## 2010-08-02 LAB — CBC
MCH: 29 pg (ref 26.0–34.0)
MCHC: 33.5 g/dL (ref 30.0–36.0)
Platelets: 205 10*3/uL (ref 150–400)
RDW: 13.9 % (ref 11.5–15.5)

## 2010-08-02 NOTE — Op Note (Signed)
NAME:  Fernando Moyer, Fernando Moyer NO.:  [id number]  MEDICAL RECORD NO.:  [id number]           PATIENT TYPE:  I  LOCATION:  3301                         FACILITY:  [facility]  PHYSICIAN:  Di Kindle. Edilia Bo, M.D.DATE OF BIRTH:  01/21/1934  DATE OF PROCEDURE:  08/01/2010 DATE OF DISCHARGE:                              OPERATIVE REPORT   PREOPERATIVE DIAGNOSIS:  Greater than 80% right carotid stenosis.  POSTOPERATIVE DIAGNOSIS:  Greater than 80% right carotid stenosis.  PROCEDURE:  Right carotid endarterectomy with Dacron patch angioplasty.  SURGEON:  Di Kindle. Edilia Bo, MD  ASSISTANT:  Newton Pigg, PA  ANESTHESIA:  General.  FINDINGS:  95% critical calcific plaque in the right carotid bulb with a coral reef plaque.  INDICATIONS:  This is a 77 year old gentleman who I have been following with moderate bilateral carotid disease.  The stenosis on the right progressed to greater than 80% and right carotid endarterectomy was recommended in order to lower his risk of future stroke.  He does have a history of some moderate aortic stenosis and is followed by Dr. Excell Seltzer, who evaluated the patient preoperatively.  The patient presents for elective right carotid endarterectomy.  TECHNIQUE:  The patient was taken to the operating room and received a general anesthetic.  The right neck was prepped and draped in the usual sterile fashion.  An incision was made along the anterior border of the sternocleidomastoid and dissection carried down to the common carotid artery which was dissected free and controlled with Rumel tourniquet. The facial vein was divided between 2-0 silk ties.  The internal carotid artery was controlled above the plaque and this was controlled with vessel loop.  The superior thyroid artery and external carotid arteries were also controlled.  The patient was then heparinized.  Clamps were then placed on the internal, then the common, then the  external carotid artery.  A longitudinal arteriotomy was made in the common carotid artery.  This was extended through the plaque into the internal carotid artery above the plaque.  A 12 shunt was placed into the internal carotid artery, back bled and then placed into common carotid artery and secured with Rumel tourniquet.  Flow was reestablished through the shunt.  An endarterectomy plane was established proximally and the plaque was sharply divided.  Eversion endarterectomy was performed of the external carotid artery.  Distally, there was a nice taper in the plaque and no tacking sutures were required.  The artery was irrigated with copious amounts of dextran and all loose debris removed.  Dacron patch was then sewn using continuous 6-0 Prolene suture.  Prior to completing the patch closure, the shunt was removed.  The artery was back bled and flushed appropriately and the anastomosis completed.  Flow was reestablished first to the external carotid artery and then to the internal carotid artery.  At completion, there was a good pulse distal to the patch and good Doppler flow.  Hemostasis was obtained in the wound, the wound was closed with a deep layer of 3-0 Vicryl.  The platysma was closed with running 3-0 Vicryl.  The skin was closed with a 4-0 subcuticular stitch.  Sterile dressing was applied.  The patient tolerated the procedure well and was transferred to the recovery room in stable condition.  All needle and sponge counts were correct.     Di Kindle. Edilia Bo, M.D.     CSD/MEDQ  D:  08/01/2010  T:  08/01/2010  Job:  [id number]  cc:   Veverly Fells. Excell Seltzer, MD Willow Ora, MD  Electronically Signed by Waverly Ferrari M.D. on 08/02/2010 08:01:00 AM

## 2010-08-03 ENCOUNTER — Inpatient Hospital Stay (HOSPITAL_COMMUNITY): Payer: Medicare Other

## 2010-08-03 LAB — BASIC METABOLIC PANEL
Calcium: 9.4 mg/dL (ref 8.4–10.5)
GFR calc non Af Amer: >60 mL/min (ref >60)
Glucose, Bld: 102 mg/dL — ABNORMAL HIGH (ref 70–99)
Potassium: 4.6 mEq/L (ref 3.5–5.1)
Sodium: 135 mEq/L (ref 135–145)

## 2010-08-08 NOTE — Discharge Summary (Signed)
NAME:  KHYREN, HING NO.:  [id number]  MEDICAL RECORD NO.:  [id number]           PATIENT TYPE:  I  LOCATION:  2021                         FACILITY:  [facility]  PHYSICIAN:  Di Kindle. Edilia Bo, M.D.DATE OF BIRTH:  04-May-1933  DATE OF ADMISSION:  08/01/2010 DATE OF DISCHARGE:  08/03/2010                              DISCHARGE SUMMARY   ADMISSION DIAGNOSIS:  Right critical carotid artery stenosis.  HISTORY OF PRESENT ILLNESS:  This is a 77 year old male who was originally seen in consultation in the hospital in March 2011.  He has been admitted for dizziness and was found to have bilateral carotid disease.  He had bilateral 60% to 79% carotid stenosis at that time and Dr. Edilia Bo recommended a followup Duplex scan in 6 months.  He understood we wound consider carotid endarterectomy as this stenosis progressed to greater than 80%.  In October 2011, his followup Duplex scan showed again bilateral 60% to 79% stenosis.  He came in for another followup study on July 19, 2010, and the stenosis on the right had progressed to greater than 80%.  He remains asymptomatic and specifically denies any history of stroke, TIAs, or receptive aphasia. He has no recent problems with dizziness.  His only complaint has been some low back pain, which has been stable and is sometimes associated with paresthesias in his legs.  HOSPITAL COURSE:  The patient was admitted to the hospital and taken to the operating room on Aug 01, 2010, where he underwent a right carotid endarterectomy with a Dacron patch angioplasty.  He tolerated the procedure well and was transported to the recovery room in a satisfactory condition.  While in the recovery room, he did have some hypotension with systolic blood pressure in the 70s and at that time Cardiology was asked to see him.  During their exam, it revealed significant murmur of aortic stenosis with the main gradient of 36 by recent echo.  He was on  dopamine, which they increased and they also increased his IV fluids over the next few hours.  On his postoperative day #1, Cardiology did come by to see him again and his oxygen saturations were noted at 84% with ambulation.  They felt this could be related to pulmonary vascular congestion, but also has underlying COPD. He clinically was not volume overloaded.  They gave the patient a dose of IV Lasix.  On postoperative day #2, the patient has no complaints. His O2 saturation was 95% on 2 L and the nurse states that the only times that he desaturated was while he was sleeping.  His chest x-ray did reveal development of mild right greater than left interstitial edema.  Cardiology has seen him and has prescribed Lasix 20 mg p.o. daily.  Otherwise, his postoperative course include increasing ambulation as well as increasing intake of solids without difficulty.  DISCHARGE INSTRUCTIONS:  He is discharged home with extensive instructions only p.r.n. rest and ambulation.  He is instructed not to drive or perform any heavy lifting until he has seen Dr. Edilia Bo in the office.  DISCHARGE DIAGNOSES: 1. Right critical carotid artery stenosis.  a.     Right carotid endarterectomy on Aug 01, 2010. 2. Postoperative hypotension, which did resolve. 3. Mild hypoxemia. 4. Nonobstructive coronary artery disease. 5. Mild diastolic heart failure. 6. Moderate critical aortic stenosis. 7. Chronic obstructive pulmonary disease. 8. Hypertension. 9. Hyperlipidemia. 10.Remote history of tobacco use. 11.Peripheral vascular disease with a 3.3 x 3.2 cm AAA and right     common iliac stenosis. 12.Gastroesophageal reflux disease.  DISCHARGE MEDICATIONS: 1. Lasix 20 mg p.o. daily. 2. Oxycodone/acetaminophen 5/325 one to two tablets p.o. q.6 h. p.r.n.     pain, #30 no refill. 3. Aspirin 325 mg p.o. daily. 4. Nitroglycerin sublingual 0.4 mg every 5 minutes as needed up to 3     doses p.r.n. 5. Pravastatin  20 mg p.o. at bedtime. 6. Prevacid OTC 1 p.o. daily.  FOLLOWUP: 1. The patient is to follow up with Dr. Edilia Bo in 2 weeks. 2. The patient will follow up with Dr. Excell Seltzer in 2 weeks with a BMET     and he will also arrange a followup visit with Dr. Craige Cotta for     pulmonary evaluation as he has seen him in the past.    ______________________________ Newton Pigg, PA   ______________________________ Di Kindle. Edilia Bo, M.D.    SE/MEDQ  D:  08/03/2010  T:  08/03/2010  Job:  [id number]  cc:   Veverly Fells. Excell Seltzer, MD Coralyn Helling, MD  Electronically Signed by Waverly Ferrari M.D. on 08/08/2010 04:51:10 PM

## 2010-08-14 NOTE — Consult Note (Signed)
NAME:  Fernando Moyer, Fernando Moyer NO.:  [id number]  MEDICAL RECORD NO.:  [id number]           PATIENT TYPE:  I  LOCATION:  3301                         FACILITY:  [facility]  PHYSICIAN:  Cassell Clement, M.D. DATE OF BIRTH:  1933-08-14  DATE OF CONSULTATION:  08/01/2010 DATE OF DISCHARGE:                                CONSULTATION   PRIMARY CARE PHYSICIAN:  Willow Ora, MD  PRIMARY CARDIOLOGIST:  Veverly Fells. Excell Seltzer, MD  CHIEF COMPLAINT:  Hypotension.  HISTORY OF PRESENT ILLNESS:  Fernando Moyer is a 77 year old male with a history of carotid disease.  He has been followed by Dr. Edilia Bo and his right ICA had progressed to greater than 80%.  He was admitted for a right carotid endarterectomy today.  Postoperatively, he had hypotension with a systolic blood pressure in the 70s at times and Cardiology was asked to evaluate him.  Fernando Moyer is sleepy from pain control medications and anesthesia, but answers questions appropriately and is oriented.  He has occasional chest pain and occasional shortness of breath, but this has not significantly changed recently.  He has chronic dyspnea on exertion, but has not had no recent lower extremity edema.  He has occasional dizziness prior to admission, but this has been felt secondary to his cerebrovascular disease.  Currently, he hurts "all over," but is having no acute chest pain or shortness of breath.  PAST MEDICAL HISTORY: 1. Status post echocardiogram on July 27, 2010, showing an EF of 65-     70% with grade 2 diastolic dysfunction, moderate severe aortic     stenosis with a mean gradient of 35 mmHg and mild mitral stenosis     with a mean gradient of 4 mmHg, PAS 46. 2. Status post cardiac catheterization in March 2010 showing LAD 50%     proximal and distal, D1 of 70%, circumflex patent, RCA 40-50%, EF     60%. 3. COPD. 4. Hypertension. 5. Hyperlipidemia. 6. Remote history of tobacco use. 7. Family history of coronary artery  disease. 8. Peripheral vascular disease with a 3.3 x 3.2 cm AAA and right     common iliac stenosed. 9. Gastroesophageal reflux disease.  SURGICAL HISTORY:  He is status post cardiac catheterization as well as right carotid endarterectomy today, EGD with dilatation and appendectomy.  ALLERGIES:  No known drug allergies.  CURRENT MEDICATIONS: 1. Aspirin 325 mg a day. 2. Sublingual nitroglycerin p.r.n. 3. Pravastatin 40 mg a day. 4. Prevacid OTC daily.  SOCIAL HISTORY:  He lives in [place] alone, but has attended family nearby.  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  He has approximately 70-pack year history of tobacco use, but quit in 1989 and has no history of alcohol or drug abuse.  FAMILY HISTORY:  Both of his parents are deceased and neither his mother nor any siblings have documented coronary artery disease, but his father had an MI at age 58.  REVIEW OF SYSTEMS:  The patient denies melena or recent reflux symptoms. He has chronic arthralgias.  Full 14-point review of systems is essentially negative except as stated in the HPI.  PHYSICAL EXAMINATION:  VITAL SIGNS:  He is afebrile.  Blood pressure now 86/64, was 107/44 with the A-line 69/37 (inaccurate per anesthesia MD). Heart rate 74, respiratory rate 17, and O2 saturation 90% on 4 liters. GENERAL:  He is a well-developed, elderly white male who is sleepy secondary to medications, but oriented and answers appropriately. HEENT:  Essentially normal. NECK:  There is no lymphadenopathy or thyromegaly, and JVD is 6-8 cm. He has the incision from today's surgery on the right and on the left, carotid bruit is appreciated. CARDIOVASCULAR:  His heart is regular in rate and rhythm with an S1 and S2 and a systolic murmur is noted at the left upper and lower sternal borders.  Distal pulses are decreased in both lower extremities, right greater than left with capillary refill approximately 5 seconds.  A femoral pulse is not easily  palpated on the right with no bruit noted and a femoral pulse is palpated on the left and a bruit is appreciated there. LUNGS:  Clear anteriorly. SKIN:  No rashes or lesions are noted and his incision is without drainage. ABDOMEN:  Soft and nontender with active bowel sounds. EXTREMITIES:  There is no cyanosis, clubbing, or edema noted. MUSCULOSKELETAL:  There is no joint deformity or effusions noted. NEURO:  He is oriented x3 with cranial nerves II-XII grossly intact.  Chest x-ray performed on July 31, 2010, shows emphysema and scarring, but no acute disease.  EKG preop shows sinus rhythm with a right bundle-branch block which is old.  LABORATORY VALUES:  Hemoglobin 16.2, hematocrit 46.4, WBCs 12.5, and platelets 278.  INR 0.89.  Sodium 138, potassium 4.6, chloride 104, CO2 of 28, BUN 12, creatinine 1.01, glucose 166, and total bilirubin 1.4. Other CMET values within normal limits.  Urinalysis negative.  IMPRESSION:  Fernando Moyer was seen today by Dr. Patty Sermons, the patient evaluated and the data reviewed.  His blood pressure remains low postop, although he is alert and perfusing his extremities adequately.  He denies chest pain or dyspnea.  Exam reveals a significant murmur of aortic stenosis with a mean gradient of 36 by recent echo. This is likely contributing to his low blood pressure.  He is currently on dopamine which has been increased to 5 mcg/kg per minute for pressure support.  We will increase IV fluids over the next few hours as well and continue to follow his vital signs closely.     Theodore Demark, PA-C   ______________________________ Cassell Clement, M.D.    RB/MEDQ  D:  08/01/2010  T:  08/02/2010  Job:  [id number]  Electronically Signed by Theodore Demark PA-C on 08/09/2010 05:26:57 PM Electronically Signed by Cassell Clement M.D. on 08/14/2010 04:44:24 AM

## 2010-08-15 NOTE — Assessment & Plan Note (Signed)
[facility]                            CARDIOLOGY OFFICE NOTE   Fernando, Moyer                     MRN:          [id number]  DATE:04/20/2008                            DOB:          27-Aug-1933    REASON FOR VISIT:  Shortness of breath.   HISTORY OF PRESENT ILLNESS:  Fernando Moyer is a 77 year old gentleman with  a history of mild COPD, nonobstructive coronary artery disease, and  peripheral arterial disease.  He was recently evaluated by Dr. Drue Novel for  progressive dyspnea.  He was referred here for further evaluation.  The  patient reports severe exertional dyspnea over the past month.  He has  long-standing mild dyspnea on exertion, but this has slowly progressed  and recently has become lifestyle limiting.  He becomes short of breath  with minimal activity such as walking short distances on level ground.  He denies orthopnea, PND, or edema.  He has undergone testing to include  a chest x-ray and echocardiogram.  His chest x-ray showed no evidence of  acute disease.  His echocardiogram was just completed in the office  today.  The patient describes long-standing chronic cough that also has  been worse recently.  He denies fevers or chills.  His cough is  nonproductive.  He complains of left-sided chest pain that occurs  intermittently at rest.  He does not have exertional chest discomfort.   Cardiac catheterization performed in 2007 demonstrated normal left  ventricular systolic function with no regional wall motion  abnormalities.  There was heavy calcification of the mitral annulus with  mild mitral valve stenosis present.  There was minimal aortic stenosis  present.  The patient's pulmonary pressures were mildly increased at  36/18 with a mean of 26.  There was nonobstructive coronary artery  disease with a 50% proximal LAD stenosis, 80% ostial diagonal stenosis,  and otherwise minimal luminal irregularities of the right coronary  artery and  left circumflex.   Echocardiogram in the office today showed normal LV systolic function  with findings consistent with mild-to-moderate aortic stenosis and  moderate mitral stenosis with a mean transmitral gradient of 4 mmHg.  The left atrium is dilated and the estimated pulmonary pressures are  elevated with a peak pulmonary pressure estimated at 61 mmHg.   CURRENT MEDICATIONS:  1. Prilosec 20 mg daily.  2. Aspirin 325 mg daily.  3. Symbicort twice daily.   ALLERGIES:  NKDA.   PHYSICAL EXAMINATION:  GENERAL:  The patient is an obese male in no  acute distress.  VITAL SIGNS:  Weight is 218 pounds, blood pressure 104/66, heart rate is  90, and respiratory rates 20.  HEENT:  Normal.  NECK:  Normal carotid upstrokes.  No bruits.  JVP normal.  LUNGS:  Clear bilaterally with moderately reduced air movement and a  prolonged expiratory phase.  CARDIOVASCULAR:  The heart is regular rate and rhythm with a grade 2/6  systolic ejection murmur along the left sternal border.  ABDOMEN:  Soft,  obese, and nontender.  EXTREMITIES:  No clubbing, cyanosis, or edema.  SKIN:  Warm and dry without  rash.   EKG shows sinus rhythm with right bundle branch block and left anterior  fascicular block, unchanged from previous tracing.   ASSESSMENT:  This is a 77 year old gentleman with progressive dyspnea as  detailed above.  The cardiac problems include nonobstructive coronary  artery disease, mild-to-moderate mitral stenosis, mild-to-moderate  aortic stenosis, and pulmonary hypertension.   The etiology of Fernando Moyer is dyspnea and is probably multifactorial.  His obstructive airway disease has been quantified as mild in the past.  I suspect the majority of his symptoms are pulmonary related.  However,  he does have moderate valvular disease and elevated pulmonary pressures.  I think the hemodynamic evaluation with right and left heart  catheterization would be helpful to better assess the cardiac   contribution to his symptoms and evaluate him for possible treatment  options.  He does not have significant valvular disease or coronary  artery disease, then his treatment will be limited to inhaled therapies  for his lung disease.  I reviewed the risks and indications of right and  left heart catheterization with the patient.  He is not eager to proceed  at this point.  He has a scheduled appointment with Dr. Drue Novel in next week  and would like to review this with Dr. Drue Novel prior to going forward with  cardiac catheterization.  I am fine with this approach as his symptoms  are progressive, but long-standing.  Of note, he has not had a good  initial response to Symbicort or metered-dose inhalers.   I will wait to hear from Fernando Moyer after he meets back with Dr. Drue Novel.  Otherwise, I will plan on seeing him back in 6 months for followup.     Veverly Fells. Excell Seltzer, MD  Electronically Signed    MDC/MedQ  DD: 04/20/2008  DT: 04/21/2008  Job #: [id number]   cc:   Willow Ora, MD

## 2010-08-15 NOTE — Assessment & Plan Note (Signed)
[facility]                            CARDIOLOGY OFFICE NOTE   KEO, SCHIRMER                     MRN:          [id number]  DATE:06/04/2008                            DOB:          06-26-1933    ADDENDUM:  This is a History and Physical addendum.   Date of procedure is June 04, 2008.   Mr. Kurtz is a 77 year old gentleman with a history of nonobstructive  CAD who was seen in our office last in January of this year.  He had  presented with progressive exertional dyspnea.  He has a history of mild  mitral valve stenosis and mild to moderate aortic valve stenosis.  He  also has had elevated pulmonary artery pressures as estimated by his  most recent echocardiogram.  He has continued to have progressive  shortness of breath and at this point can do very little because of his  breathing.  He has a nonproductive cough and also has occasional chest  pain.  At the time of our office visit on January 19 I recommended right  and left heart catheterization to better assess his hemodynamics and  coronary anatomy.  He wanted to think about things and discuss them with  Dr. Drue Novel and Dr. Craige Cotta.  That discussion has occurred and he now presents  for cardiac catheterization.  There are no other new symptoms to report.   PHYSICAL EXAM:  GENERAL:  The patient is alert and oriented.  He is in  no acute distress.  VITAL SIGNS:  He is afebrile.  His vital signs are stable.  HEENT:  Normal.  NECK:  Normal carotid upstrokes.  JVP is normal.  LUNGS:  Clear.  HEART:  Regular rate and rhythm without murmurs or gallops.  ABDOMEN:  Abdomen is soft, nontender.  No organomegaly.  EXTREMITIES:  There is no clubbing, cyanosis or edema.  Peripheral  pulses are intact and equal.   LABS:  Prior to the procedure show a white blood cell count of 8.4,  hemoglobin 15.1, platelet count of 286,000.  INR is 0.9, potassium is  5.0, creatinine 0.9.   ASSESSMENT:  This is a  77 year old gentleman with mixed valvular heart  disease and pulmonary hypertension with progressive dyspnea.  Planned  for right and left heart catheterization today as outlined above.  Risks  and indications of the procedure were reviewed in detail with the  patient and his family.  They agreed to proceed.     Veverly Fells. Excell Seltzer, MD  Electronically Signed   MDC/MedQ  DD: 06/04/2008  DT: 06/04/2008  Job #: [id number]   cc:   Willow Ora, MD  Coralyn Helling, MD

## 2010-08-15 NOTE — Assessment & Plan Note (Signed)
OFFICE VISIT   Fernando Moyer, Fernando Moyer  DOB:  November 13, 1933                                       01/04/2010  EAVWU#:[id number]   I saw the patient in the office today for continued follow-up of his  carotid disease.  I had seen him as a consultation in the hospital in  March of this year.  He had been admitted with dizziness and nausea.  Part of his workup included a carotid duplex scan which showed a 60%-79%  carotid stenosis bilaterally.  He comes in for 34-month follow-up visit.  Since I saw him last, he has had no history of stroke, TIAs, expressive  or receptive aphasia, or amaurosis fugax.   REVIEW OF SYSTEMS:  He has had no recent chest pain, chest pressure,  palpitations or arrhythmias.  He has had no productive cough,  bronchitis, asthma or wheezing.   SOCIAL HISTORY:  He is widowed.  Two children.  He quit tobacco in 1989.  He does not drink alcohol on a regular basis.   PHYSICAL EXAMINATION:  This is a pleasant 77 year old gentleman who  appears his stated age.  His blood pressure is 145/72, heart rate is 78,  saturation 98%.  Lungs:  Clear bilaterally to auscultation.  Cardiovascular:  He has a right carotid bruit.  He has a regular rate  and rhythm with a systolic murmur.  He has palpable femoral pulses  bilaterally with warm well-perfused feet.  No ischemic ulcers.  Abdomen:  Soft and nontender is obese and difficult to assess for an aneurysm.  Neurologic:  He has no focal weakness or paresthesias.   I have independently interpreted his part carotid duplex scan which  shows a 60% to 79% right carotid stenosis.  The velocities on the right  have increased compared to the study in March, however the stenosis  still less than 80%.  On the left side, the stenosis is in the upper end  of the 40%-59% range.  He does have a diminished brachial pressure on  the left with to-and-fro flow in the left vertebral artery.  However, he  has minimal vertebral  basilar symptoms.   With respect to right carotid stenosis, he understands we would not  consider right carotid endarterectomy unless the stenosis progressed to  greater than 80%.  I have ordered a follow-up carotid duplex scan in 6  months and I will see him back at that time.  He knows to call sooner if  he has problems.  In the meantime he knows to continue taking his  aspirin.  We would not consider addressing the subclavian stenosis on  the left unless he develops problems with persistent dizziness or left  arm symptoms.     Di Kindle. Edilia Bo, M.D.  Electronically Signed   CSD/MEDQ  D:  01/04/2010  T:  01/05/2010  Job:  [id number]   cc:   Willow Ora, MD  Veverly Fells. Excell Seltzer, MD

## 2010-08-15 NOTE — Procedures (Signed)
CAROTID DUPLEX EXAM   INDICATION:  Blurred vision.   HISTORY:  Diabetes:  No.  Cardiac:  No.  Hypertension:  Yes.  Smoking:  Previously, quit in 1989.  Previous Surgery:  Appendectomy.  CV History:  No.  Amaurosis Fugax , Paresthesias , Hemiparesis                                       RIGHT             LEFT  Brachial systolic pressure:         158               62  Brachial Doppler waveforms:         WNL               Monophasic  Vertebral direction of flow:        Antegrade         To and fro  DUPLEX VELOCITIES (cm/sec)  CCA peak systolic                   76                101  ECA peak systolic                   277               299  ICA peak systolic                   447               207  ICA end diastolic                   109               39  PLAQUE MORPHOLOGY:                  Heterogenous      Heterogenous  PLAQUE AMOUNT:                      Severe            Severe  PLAQUE LOCATION:                    Bifurcation, proximal ICA           CCA, proximal ICA, and  bifurcation   IMPRESSION:  1. Right-sided internal carotid artery stenosis of upper limits of      normal, 60% to 79% stenosis.  2. Left side upper limits of normal with elevated velocities distally      at 207 cm/s, peak systolic 40% to 59% stenosis on the left.  To and      fro waveforms within the left vertebrals suggestive of partial      subclavian steal.   ___________________________________________  Di Kindle. Edilia Bo, M.D.   OD/MEDQ  D:  01/04/2010  T:  01/04/2010  Job:  [id number]

## 2010-08-15 NOTE — Progress Notes (Signed)
[facility]                        PERIPHERAL VASCULAR OFFICE NOTE   DAMONTA, COSSEY                     MRN:          [id number]  DATE:07/17/2007                            DOB:          08/19/1933    HISTORY OF PRESENT ILLNESS:  Mr. Yurchak is a 77 year old gentleman with  nonobstructive coronary artery disease and lower extremity peripheral  arterial disease.  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  He underwent cardiac catheterization in November 2007 that showed normal  LV function and his abdominal aortogram demonstrated occlusion of the  right common iliac with reconstitution of flow through collaterals  supplying the internal iliac.  His ABIs back at that time were 0.56 on  the right and 0.94 on the left.   From a symptomatic standpoint, he continues to have right leg  claudication involving the thigh and calf.  His symptoms occur with 50-  60 yards.  The resolve quickly a rest.  He has no left leg pain.  Mr.  Tassinari is also followed for moderate carotid stenosis.  His carotid  ultrasound from January 8 showed 60-79% bilateral internal carotid  stenoses.  He also has left subclavian stenosis with retrograde flow in  the left vertebral artery.  He denies symptoms of dizziness, syncope,  amaurosis or numbness, tingling, weakness of the extremities.   CURRENT MEDICATIONS:  1. Prilosec 20 mg daily.  2. Aspirin 325 mg daily.   ALLERGIES:  NKDA.   PHYSICAL EXAMINATION:  GENERAL:  The patient is alert and oriented.  He  is an obese male in no distress.  VITAL SIGNS:  Weight is 224, blood pressure 104/72, heart rate 74,  respiratory rates 16.  HEENT:  Normal.  NECK:  Normal carotid upstrokes with bilateral carotid bruits.  LUNGS:  Clear bilaterally.  HEART:  Regular rate and rhythm without murmurs or gallops.  ABDOMEN:  Soft, obese, nontender, no organomegaly.  EXTREMITIES:  Lower extremity pulses are difficult to palpate.   There is  no edema in the legs.  The feet are warm and appear well-perfused.  There are no skin ulcerations.  Femoral and pedal pulses are not  palpable on exam.   STUDIES:  EKG shows sinus rhythm with bifascicular block with the right  bundle branch and left anterior fascicular block.   ASSESSMENT:  Mr. Heo has stable, mildly lifestyle-limiting  claudication.  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  Will continue with medical management as his  symptoms are not progressive and he has no evidence of critical limb  ischemia.  If he develops progressive symptoms, it may be worthwhile to  consider repeat angiography, but would have to review his anatomy prior  to that.   With regard to his medical therapy, he should stay on daily aspirin.  He  really would benefit from a statin, and he has been on Zocor in the  past.  He thinks he was intolerant to this but cannot remember the  details.  I am going to give him a trial of Crestor 10 mg  daily and  follow up lipids and LFTs in 3 months.  I will plan on seeing Mr. Vi  back in one year followup.     Veverly Fells. Excell Seltzer, MD  Electronically Signed    MDC/MedQ  DD: 07/17/2007  DT: 07/17/2007  Job #: [id number]   cc:   Willow Ora, MD

## 2010-08-16 ENCOUNTER — Ambulatory Visit (INDEPENDENT_AMBULATORY_CARE_PROVIDER_SITE_OTHER): Payer: Medicare Other | Admitting: Vascular Surgery

## 2010-08-16 DIAGNOSIS — I6529 Occlusion and stenosis of unspecified carotid artery: Secondary | ICD-10-CM

## 2010-08-17 NOTE — Assessment & Plan Note (Signed)
OFFICE VISIT  Fernando Moyer, Fernando Moyer DOB:  July 04, 1933                                       08/16/2010 ZOXWR#:[id number]  I saw patient in the office today for follow-up after his recent right carotid endarterectomy.  This is a 77 year old gentleman with bilateral carotid disease and stenosis on the right progressed to greater than 80% and right carotid endarterectomy.  It was recommended in order to lower his risk of future stroke.  He does have a history of moderate aortic stenosis.  He was seen by Dr. Excell Seltzer both preoperatively and perioperatively and did well from a cardiac standpoint.  He comes in for his first outpatient visit.  Overall he has been doing quite well.  He has no specific complaints.  He has had no focal weakness or paresthesias.  PHYSICAL EXAMINATION:  Blood pressure is 142/72, heart rate is 86, temperature 97.7.  His right neck incision is healing nicely.  Lungs are clear bilaterally to auscultation.  Neurologic exam is intact.  He has a moderate 40% to 59% left carotid stenosis.  We will see him back in 6 months with a follow-up carotid duplex scan. He knows to call sooner if he has problems.  In the meantime, he knows to continue taking his aspirin.    Di Kindle. Edilia Bo, M.D. Electronically Signed  CSD/MEDQ  D:  08/16/2010  T:  08/17/2010  Job:  [id number]  cc:   Veverly Fells. Excell Seltzer, MD Willow Ora, MD

## 2010-08-18 NOTE — Cardiovascular Report (Signed)
NAME:  ANIK, WESCH NO.:  [id number]   MEDICAL RECORD NO.:  [id number]          PATIENT TYPE:  OIB   LOCATION:  1967                         FACILITY:  [facility]   PHYSICIAN:  Veverly Fells. Excell Seltzer, MD  DATE OF BIRTH:  1933/10/11   DATE OF PROCEDURE:  DATE OF DISCHARGE:  06/04/2008                            CARDIAC CATHETERIZATION   PROCEDURES:  1. Right external iliac angiography.  2. Abdominal aortic angiography.  3. Selective coronary angiography.  4. Right and left heart catheterization.   PROCEDURAL INDICATIONS:  Mr. Sabedra is a 77 year old gentleman with  progressive exertional dyspnea.  He has mixed valvular heart disease  with a history of moderate mitral stenosis and aortic stenosis.  In the  setting of his progressive symptoms with marked dyspnea on exertion, he  was referred for right and left heart catheterization for further  assessment of his hemodynamics and coronary anatomy as well as  assessment of his left ventricular function.   Risks and indications of the procedure were reviewed with the patient.  Informed consent was obtained.  The right groin was prepped, draped, and  anesthetized with 1% lidocaine using modified Seldinger technique.  A 4-  French sheath was placed in the right femoral artery and a 6-French  sheath was placed in the right femoral vein.  I was unable to pass a  wire beyond the common iliac artery on the right.  Therefore, an  external iliac angiogram was performed and demonstrated total occlusion  of the right common iliac artery.  At that point, accessed into the left  common femoral was obtained.  Using the modified Seldinger technique, a  5-French sheath was placed in the left common femoral artery.  Selective  coronary angiography was performed with standard Judkins catheters.  Left ventriculography was performed with an angled pigtail catheter.  Right heart pressures were recorded with an end-hole multipurpose  catheter.   Simultaneous left ventricular and pulmonary capillary wedge  pressures were recorded.  Pullback across the aortic valve was  performed.  The pigtail catheter was brought down to the abdominal aorta  and then a suprarenal abdominal aortogram was performed.   FINDINGS:  Hemodynamics:  Aortic oxygen saturation 91%, pulmonary artery  saturation 71%, and superior vena cava saturation 71%.  Fick cardiac  output 6.4 liters per minute.  Cardiac index 3 L/min/m2.  PVR is 1.1  Wood units.   Hemodynamic pressures:  Right atrial pressure mean of 8, right  ventricular pressure 47/16, pulmonary artery pressure 44/22 with a mean  of 31, pulmonary capillary wedge pressure mean of 26, left ventricular  pressure 171/20, aortic pressure 158/74 with a mean of 109.   The mitral valve area with using the Fick cardiac output is 2.15cm2.  The aortic valve area is 2.3cm2.  The mean gradient across the aortic  valve is 9.7 mmHg.  The mean gradient across mitral valve is 10.7 mmHg.   Coronary angiography:  Left mainstem has minimal lumen irregularities.  There is no significant obstructive disease noted.  The left main  bifurcates into the LAD and left circumflex.   The left  circumflex is a moderate-sized vessel.  It courses down the AV  groove.  The first OM is a large OM branch that has no significant  obstructive disease.  The AV groove circumflex courses down and has  minimal lumen irregularity.  There is an atrial branch supplied from the  midportion of the AV groove circumflex.   LAD:  The LAD is a large vessel and proximally the midportion of the LAD  has mild diffuse stenosis in the range of 50%.  This occurs in the area  of 2 diagonal branches.  The first diagonal is a large branch that has a  70% proximal stenosis, second diagonal has no significant stenosis, the  mid distal LAD beyond the area of 50% stenosis have no obstructive  disease present.   Right coronary artery:  The right coronary  artery has mild diffuse  plaque.  The proximal vessel has 30-40% stenosis.  The distal vessel has  50% stenosis of the mid and midportion of the right coronary artery has  no significant angiographic stenosis.  The PDA branch is widely patent  with no significant disease.  There are 2 posterolateral branches that  have no significant stenosis.   Left ventriculography:  A left ventricular function is normal.  The LVEF  is estimated at 60%.  There is no mitral regurgitation.   Fluoroscopy demonstrates heavy calcification of the entire mitral valve  annulus.  There is mild calcification of the aortic valve.   Abdominal aortography shows patent renal arteries bilaterally.  There is  an infrarenal abdominal aortic aneurysm present.  The left common iliac  artery is patent.  The internal and external iliac arteries on the left  are patent.  The right common iliac artery is totally occluded.   ASSESSMENT:  1. Diffuse nonobstructive coronary artery disease as outlined above.  2. Normal left ventricular systolic function.  3. Mild mitral valve stenosis.  4. Mild aortic valve stenosis.  5. Abdominal aortic aneurysm.  6. Right common iliac occlusion.   DISCUSSION:  Based on his hemodynamics, I suspect the patient's  progressive dyspnea is multifactorial.  He does not have severe valvular  heart disease or severe coronary artery disease.  He does have elevated  left ventricular pressures and mildly elevated right heart pressures.  He may have pulmonary disease that contributes as well.  I recommend  medical therapy for his coronary and peripheral vascular disease.  His  only reasonable option for revascularization of his right common iliac  occlusion would be surgery.  I think his surgical risk is fairly  significant in the setting of his age and underlying medical problems.  He does need assessment of his abdominal aortic aneurysm and an  abdominal ultrasound would be performed to assess the  size of the  aneurysm.  We will plan on seeing him back in the office in  approximately 2 weeks for further discussion and review of his medical  therapy.      Veverly Fells. Excell Seltzer, MD  Electronically Signed     MDC/MEDQ  D:  06/08/2008  T:  06/09/2008  Job:  [id number]   cc:   Willow Ora, MD  Coralyn Helling, MD

## 2010-08-18 NOTE — Assessment & Plan Note (Signed)
[facility]                            CARDIOLOGY OFFICE NOTE   BRODI, KARI                     MRN:          [id number]  DATE:07/09/2006                            DOB:          03-03-1934    HISTORY OF PRESENT ILLNESS:  Mr. Mowrey is a 77 year old gentleman with  nonobstructive atherosclerotic coronary disease and right leg  claudication.  At angiogram in December 2007 we found him to have  occlusion of the right common iliac artery from its origin through to  the origin of the internal iliac.  He also had a 60% stenosis of the  left common iliac artery.  There is a 3-cm abdominal aortic aneurysm.  He also has very mild aortic stenosis with a peak gradient of 15.   He has been doing well since I last saw him 3 months ago.  He was unable  to tolerate the Pletal I prescribed due to some nosebleeds.  He also  stopped the beta blocker due to headaches.  He is feeling well now.  He  has not had any recurrence of the chest discomfort, which was bothering  him in the fall.  His claudication remains minimally lifestyle limiting  to him.  He has had no symptoms concerning for stroke or TIA.   CURRENT MEDICATIONS:  1. Zocor 40 mg daily.  2. Prilosec 20 mg daily.  3. Aspirin 81 mg daily.   PHYSICAL EXAMINATION:  He is generally well appearing, in no distress.  Heart rate 88.  Blood pressure 102/70.  Weight of 219 pounds.  Weight is  up 2 pounds from January.  He has no jugular venous distention, thyromegaly, or lymphadenopathy.  LUNGS:  Clear to auscultation.  Respiratory effort is normal.  He has a nondisplaced point of maximal cardiac impulse.  There is a  regular rate and rhythm with a 1/6 systolic ejection murmur at the right  upper sternal border without radiation.  ABDOMEN:  Obese, soft, non-distended, and non-tender.  There is no  hepatosplenomegaly or midline pulsatile mass.  No abdominal bruit.  Normal bowel sounds.  EXTREMITIES:   Warm without edema.   Electrocardiogram demonstrates normal sinus rhythm with right bundle  branch block and left anterior fascicular block.  There is LVH.   IMPRESSION/RECOMMENDATIONS:  1. Coronary disease:  Nonobstructive disease.  Continue prevention      with aspirin and statin.  2. Right leg claudication:  Minimally lifestyle limiting.  Plan      conservative management.  Emphasized importance of exercise.  3. Abdominal aortic aneurysm:  It is 3.3 cm.  Due for repeat duplex in      December 2010.  4. Atherosclerotic coronary disease:  With 60% to 70% stenosis      bilaterally.  Due for repeat study in June.  5. Left subclavian stenosis:  Asymptomatic.  Continue conservative      management.  6. Hypercholesterolemia:  Managed by Dr. Drue Novel.  Goal LDL less than 70.      Patient will follow up in 12 months' time.     Salvadore Farber, MD  Electronically  Signed    WED/MedQ  DD: 07/11/2006  DT: 07/11/2006  Job #: [id number]

## 2010-08-18 NOTE — Assessment & Plan Note (Signed)
[name]                              CARDIOLOGY OFFICE NOTE   ANTHON, Moyer                     MRN:          [id number]  DATE:02/19/2006                            DOB:          08-25-1933    PRIMARY CARE PHYSICIAN:  Willow Ora, M.D.   REASON FOR CONSULTATION:  Patient referred by Dr. Drue Novel for abnormal  electrocardiogram and claudication.   HISTORY OF PRESENT ILLNESS:  Fernando Moyer is a 77 year old gentleman with no  prior history of atherosclerotic disease.  He describes 4 years of  exertional dyspnea at approximately 100 yards of walking on level ground  accompanied by some left sided chest discomfort.  He has never had similar  symptoms at rest.  The pain does not radiate outside of his chest.  The  dyspnea and discomfort are promptly relieved with rest.  He has noted his  exercise threshold to diminish somewhat over the past year or so.   In addition, Fernando Moyer has noted more than a year of right hip and calf  discomfort occurring with walking.  He continues to walk, his toes become  numb.  These symptoms are also relieved with rest.  He thinks the onset of  this is somewhere in the range of 50 yards.  He has no similar symptoms on  the left.   He denies any abdominal pain and has never had a TIA or stroke.   PAST MEDICAL HISTORY:  GERD with esophageal stricture with prior dilatation,  BPH, appendectomy.   ALLERGIES:  NONE KNOWN.   CURRENT MEDICATIONS:  1. Aspirin 81 mg per day.  2. Prilosec OTC 20 mg per day.  3. Simvastatin 40 mg per day recently added.  4. Augmentin recently added by Dr. Drue Novel.   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  He enjoys watching sports.  He has been widowed for  12 years.  He has 2 grown sons.  He previously smoked more than 70 pack  years but quit in 1988.  Denies significant alcohol use.  Denies illicit  drug use.   FAMILY HISTORY:   Father died at 20 of a myocardial infarction.  He also had  a cancer but the patient does not know any details.  Mother died at an  unknown age of unknown malignancy.  He has 3 siblings who are all alive and  well.  Both of his sons are alive and well.   REVIEW OF SYSTEMS:  Wears glasses.  Has occasional tinnitus.  Reflux  symptoms which are chronic.  Otherwise negative in detail except as above.   PHYSICAL EXAMINATION:  He is generally well appearing other than being  overweight.  He has a heart rate of 76, blood pressure 146/72 on the right  and 148/68 on the left.  He is 5 feet 7 inches tall and weighs 217 pounds.  Skin exam is remarkable for a small maculopapular rash on his right arm,  otherwise normal.  HEENT AND MUSCULOSKELETAL:  Normal.  He has no jugular  venous distention,  thyromegaly or lymphadenopathy.  RESPIRATORY EFFORT:  Normal.  LUNGS:  Clear to auscultation.  He has a nondisplaced point of maximal  cardiac impulse.  There is a regular rate and rhythm without murmur, rub, or  gallop.  ABDOMEN:  Soft, nondistended, nontender.  There is no hepatosplenomegaly.  I  cannot appreciate any pulsatile midline mass.  Bowel sounds are normal.  EXTREMITIES:  Warm without clubbing, cyanosis, edema, or ulceration.  Carotid pulses 2+ bilaterally with a bruit on the right.  Right femoral  pulse is trace.  DP and PT are not palpable on that side.  On the left, the  femoral, popliteal and DP pulses are all 2+.  He is alert and oriented x3 with normal affect.   Recent [facility] screening, which the patient as provided to me, performed on  January 05, 2006, remarkable for bilateral carotid stenoses with a peak  systolic velocity in the right internal carotid artery noted at 220 cm/sec  on the left and 153 cm/sec.  They describe a 3.2 x 3.2 abdominal aortic  aneurysm.  An ABI on the right is 0.56, on the left 0.94.   LABORATORY STUDIES:  Dated February 01, 2006:  Remarkable for hematocrit  47,  platelets 271,000, creatinine 0.9, potassium 4.9, HDL 25, LDL 120 and  triglycerides 204.  Chest x-ray performed February 12, 2006, showed no  active lung disease and normal cardiac size.   IMPRESSION RECOMMENDATIONS:  1. Exertional chest pain, symptoms classic for angina.  His fairly marked      exertional dyspnea raises concern for left main, proximal left anterior      descending, or multivessel disease.  I, therefore, recommended      proceeding directly to cardiac catheterization.  Will plan on      performing this next week.  Risks including groin bleeding, vascular      injury, stroke, myocardial infarction and even death were all explained      to Fernando Moyer.  He agrees to proceed.  I will continue him on his      aspirin and simvastatin.  Will give him a prescription for      nitroglycerin today.  2. Right leg claudication:  Exam suggestive of right iliac disease.  Will      plan abdominal aortography at time of catheterization.  3. Abdominal aortic aneurysm:  Will obtain ultrasound to size the      aneurysm.  I have found multiple lifeline screenings that have been      erroneous in the past.  4. Carotid bruit:  Will check full study to confirm lifeline findings.  5. Hypercholesterolemia.  Recently started on simvastatin by Dr. Drue Novel.  He      will continue to follow this.     Fernando Farber, MD  Electronically Signed    WED/MedQ  DD: 02/24/2006  DT: 02/24/2006  Job #: [id number]   cc:   Willow Ora, MD

## 2010-08-18 NOTE — Assessment & Plan Note (Signed)
[facility]                            CARDIOLOGY OFFICE NOTE   Fernando, LICHTY                     MRN:          [id number]  DATE:03/05/2006                            DOB:          1933/12/24    This is a 77 year old white male patient of Dr. Samule Ohm and Dr. Drue Novel who  underwent cardiac catheterization for dyspnea on exertion and chest  pain. He was found to have mild non-obstructive coronary artery disease  to be treated medically, mild mitral stenosis, trivial aortic stenosis,  normal LV function and normal left ventricular and diastolic pressure.  Dr. Samule Ohm felt some of his dyspnea is perhaps contributed by his mild  mitral stenosis. However, it is quite mild and his dyspnea is out of  proportion to this. He therefore recommended investigating a pulmonary  contribution to his dyspnea and follow up with Dr. Drue Novel. He also had an  abdominal aortic ultrasound and carotid Dopplers today to size and  aneurysm that was picked up on a screening at the [facility].   Since the patient has been home, he has not done any strenuous activity  and therefore has not had any chest pain. He does complain of  significant pain in his left groin at the catheter site. He states that  they had a lot of trouble getting the tubes out and he bled a lot. He is  very uncomfortable especially when he lies down.   CURRENT MEDICATIONS:  1. Toprol XL 25 mg a day.  2. Zocor 40 mg daily.  3. Nitroglycerin p.r.n.  Although  he did not bring his medications with him, the discharge  summary states that he is on aspirin 81 mg daily, Prilosec 20 mg daily  and simvastatin 40 mg nightly. He is not sure what he is taking.   PHYSICAL EXAMINATION:  This is a pleasant 77 year old white male in no  acute distress. Blood pressure is 120/87, pulse is 73, weight is 216.  NECK: Without JVD, HJR. He does have bilateral carotid bruits.  LUNGS:  Decreased breath sounds, with fine crackles at the  bases.  HEART: Regular rate and rhythm at 70 beats per minute. Normal S1 and S2;  1/6 harsh systolic murmur at the left sternal border and a 2/6 systolic  murmur at the apex.  ABDOMEN: Soft, without organomegaly, masses, lesions or abnormal  tenderness.  LEFT GROIN: Very ecchymotic into his groin area, lower abdomen and upper  leg. It is difficult to palpate his femoral artery. I do not hear a  bruit or feel a significant hematoma, but he is quite tender from all  the bruising.  EXTREMITIES: Without cyanosis, clubbing or edema. Decreased distal  pulses.   EKG: Normal sinus rhythm, right bundle branch block, LVH.   IMPRESSION:  1. Dyspnea on exertion and chest discomfort, question secondary to      mild MS, may also have a pulmonary component, needs work up.  2. Mild non-obstructive coronary artery disease with normal left      ventricular function on cardiac cath February 27, 2006.  3. Left groin bleed after cath,  rule-out pseudo aneurysm.  4. Peripheral vascular disease with right leg claudication with 60%      ostial stenosis of the left common iliac artery and totally      occluded right common iliac. Distal vessel fills via collaterals.  5. Abdominal aortic aneurysm on [facility] screenings. Awaiting      Dopplers here.  6. Hypercholesterolemia.  7. Prior heavy tobacco abuse, quit in 1989.  8. Hyperlipidemia.   PLAN:  At this time, the patient is stable from a cardiac standpoint. We  will check a Doppler of his left femoral artery to rule-out pseudo  aneurysm. He is to see Dr. Drue Novel for further workup on any pulmonary  component contributing to his dyspnea on exertion and he will follow up  with Dr. Samule Ohm in 1 month.      Fernando Reedy, PA-C  Electronically Signed      Luis Abed, MD, [facility]  Electronically Signed   ML/MedQ  DD: 03/05/2006  DT: 03/05/2006  Job #: [id number]

## 2010-08-18 NOTE — Cardiovascular Report (Signed)
NAME:  ASHLEIGH, ARYA NO.:  [id number]   MEDICAL RECORD NO.:  [id number]          PATIENT TYPE:  INP   LOCATION:  2915                         FACILITY:  [facility]   PHYSICIAN:  Salvadore Farber, MD  DATE OF BIRTH:  [birth year]   DATE OF PROCEDURE:  02/27/2006  DATE OF DISCHARGE:                            CARDIAC CATHETERIZATION   PRIMARY CARE PHYSICIAN:  Willow Ora, M.D.   PROCEDURE:  Left and right heart catheterization, left ventriculography,  coronary angiography, abdominal aortography.   INDICATIONS:  Mr. Omdahl is a 77 year old gentleman with recently  diagnosed peripheral arterial disease in the setting of  hypercholesterolemia and prior heavy tobacco use.  He describes several  years of exertional dyspnea, now accompanied by some left-sided chest  discomfort.  He has noted that his exercise threshold has diminished  somewhat over the past year.  In addition, Mr. Dresch has noted right  hip and calf discomfort occurring with walking but not at rest.  ABIs  are 0.56 on the right and 0.94 on the left.  He presents today for  coronary angiography to exclude multivessel coronary disease as an  etiology of his dyspnea, given high pretest probability.  Will also  perform right heart catheterization and abdominal aortography, given his  dyspnea and claudication.   PROCEDURAL TECHNIQUE:  Informed consent was obtained.  Under 1%  lidocaine local anesthesia, a 4-French sheath was placed in the right  common femoral artery and a 7-French sheath in the right common femoral  vein using the modified Seldinger technique.  Right heart  catheterization was performed using a balloon-tipped catheter.  Cardiac  output was measured using the Fick technique.  Left heart  catheterization and ventriculography were then performed using a pigtail  catheter.  The pigtail catheter was then pulled back to the suprarenal  abdominal aorta.  Abdominal aortography was performed by power  injection.  The catheter was then pulled back further into the  infrarenal abdominal aorta, and abdominal aortography was performed to  assess the iliac arteries.  Coronary angiography was then performed  using JL-4 and JR-4 catheters.  The patient tolerated the procedure well  and was transferred to the holding room in stable condition.  The sheath  will be removed there.   COMPLICATIONS:  None.   FINDINGS:  1. Hemodynamics.  RA 11/9/7, RV 39/7/9, PA 36/18/26, PCW 21/21/19, LV      141/12/14, aorta 138/62/92.  Mild aortic stenosis with a peak      gradient of 15, mean gradient of 12, and a calculated valve area of      1.35 cm2.  He also has mild mitral stenosis with a peak gradient of      5, mean gradient of 6, and a calculated valve area of 1.75 cm2.      Cardiac output/index 4.3/2.1 (Fick).  2. Ventriculography:  The ejection fraction is greater than 70%      without regional wall motion abnormality.  There is no mitral      regurgitation.  Heavy calcification of the mitral annulus is noted      on  fluoroscopy.  3. Abdominal aortography:  There is an infrarenal abdominal aortic      aneurysm which appears small.  There is a single right renal artery      which has an ostial 30% stenosis.  There are two left renal      arteries.  Both appeared to have approximately 40% stenoses.  There      is some overlap of the vessels so more severe stenosis cannot be      definitively excluded.  4. Iliac arteries:  There is a 60% ostial stenosis of the left common      iliac artery.  The right common iliac artery is occluded.  The      distal vessel fills via collaterals primarily from the pelvis      through the internal iliac artery.  5. Coronary angiography:  One left main, angiographically normal.  6. LAD:  Moderate-sized vessel giving rise to two diagonals.  The      proximal LAD has a 50% stenosis at the origin of the first      diagonal.  This first diagonal has an 80% ostial stenosis.   The      remainder of the vessels have only minor luminal irregularities.  7. Circumflex:  Moderate-sized vessel giving rise to a single large      marginal.  He has minor luminal irregularities.  8. RCA:  Large, dominant vessel.  There is a 30% stenosis of the      midvessel.   IMPRESSION AND PLAN:  1. Mild nonobstructive coronary disease which we will treat medically.  2. Mild mitral stenosis.  3. Trivial aortic stenosis.  4. Normal left ventricular size and systolic function with normal left      ventricular end-diastolic pressure.   I think his dyspnea is perhaps contributed to by his mild mitral  stenosis.  However, it is quite mild, and his dyspnea is out of  proportion to his mitral stenosis.  We will therefore consider  investigation of pulmonary contribution to his dyspnea.  Will discuss  with Dr. Drue Novel.  Will plan outpatient abdominal aortic ultrasound to  better assess the size of his infrarenal abdominal aortic aneurysm.  Management of his iliac arteries will depend upon that finding.      Salvadore Farber, MD  Electronically Signed     WED/MEDQ  D:  02/27/2006  T:  02/28/2006  Job:  [id number]   cc:   Willow Ora, MD

## 2010-08-18 NOTE — Assessment & Plan Note (Signed)
[facility]                            CARDIOLOGY OFFICE NOTE   LEGACY, LACIVITA                     MRN:          [id number]  DATE:04/04/2006                            DOB:          1934/03/21    PRIMARY CARE PHYSICIAN:  Willow Ora, MD   HISTORY OF PRESENT ILLNESS:  Fernando Moyer is a 77 year old gentleman with  mild nonobstructive atherosclerotic coronary disease and right leg  claudication due to right common iliac artery occlusion.  He also has a  small abdominal aortic aneurysm.  He presents for followup today.  He  has had more than a year of right hip and calf claudication occurring  with walking.  He develops discomfort at about 50 yards of walking on  level ground.   He also has some exertional dyspnea that prompted cardiac  catheterization.  He was found to have very mild aortic stenosis with a  peak gradient of 15, mild mitral stenosis with a calculated valve area  of 1.75 cm squared and mild nonobstructive atherosclerotic coronary  disease.  Left ventricular size and systolic function were preserved and  left ventricular end diastolic pressure was normal.  PFT's demonstrated  mild obstructive deficit with normal DLCO.   CURRENT MEDICATIONS:  1. Toprol XL 25 mg daily.  2. Zocor 40 mg daily.  3. Aspirin 81 mg daily.  4. Prilosec 20 mg per day.   PHYSICAL EXAMINATION:  GENERAL APPEARANCE:  He is an overweight, but  otherwise well-appearing, in no distress.  VITAL SIGNS:  Heart rate 80, blood pressure 110/70. Weight of 217  pounds.  NECK: He has no jugular venous distention, thyromegaly or  lymphadenopathy.  LUNGS:  Respiratory effort is normal.  Lungs are clear to auscultation.  HEART: He has a nondisplaced point of maximal cardiac impulse.  There is  a regular rate and rhythm with a 1/6 systolic ejection murmur at the  right upper sternal border without radiation.  ABDOMEN:  Obese, soft, nondistended, nontender.  There is no  hepatosplenomegaly.  Bowel sounds are normal.  There is no pulsatile  midline mass and no abdominal bruit.  Right femoral pulse not palpable.  Left is 1+.   IMPRESSION:  1. Exertional chest pain, now resolved.  Catheterization showed no      severe coronary disease.  2. Right leg claudication.  Will try conservative management given a      fairly long total occlusion and small abdominal aortic aneurysm.      Will try Pletal and exercise.  Discussed with him the potential      side effects of Pletal.  3. Abdominal aortic aneurysm, 3.3 cm by recent ultrasound.  Will      recheck in two years.  4. Atherosclerotic carotid disease, 60 to 79% stenosis bilaterally.  5. Left subclavian stenosis, asymptomatic with 30 mmHg gradient.  6. Hypercholesterolemia, managed by Dr. Drue Moyer.  Goal LDL less than 70.   PLAN:  I will plan on seeing him back in three months' time.     Fernando Farber, MD  Electronically Signed    WED/MedQ  DD:  04/04/2006  DT: 04/04/2006  Job #: [id number]   cc:   Willow Ora, MD

## 2010-08-18 NOTE — H&P (Signed)
[name]. [facility]  Patient:    Fernando Moyer, Fernando Moyer                     MRN: [id number] Adm. Date:  [id number] Attending:  Carmelina Peal CC:         Cecil Cranker, M.D.   History and Physical  REFERRING PHYSICIANS:  Sheppard Penton. Stacie Acres, M.D. and Cecil Cranker, M.D.  INDICATION FOR CONSULTATION:  Chest pain.  HISTORY:  [name] is a 77 year old gentleman who presented to the urgent care center this morning with chief complaint of substernal chest pressure radiating to his neck and left arm.  This has been present for approximately one month, but has been progressively more severe.  The pain has occurred on a daily basis.  Questionable nausea but no vomiting.  The patient has had shortness of breath and increased fatigue and weakness with the chest pain.  There has been no diaphoresis.  The pain is aggravated by exertion and relieved by rest and in passage.  Coronary risk factors are significant for male sex.  His cholesterol status is unknown.  He stopped smoking 10 years ago.  Father had a myocardial infarction at age 78.  No history of hypertension or diabetes.  He was treated in the emergency room with O2, aspirin, and GI cocktail, with significant relief of the chest pain.  PAST MEDICAL HISTORY:  Significant for esophageal strictures, which had the presenting symptoms of difficulty swallowing.  He is status post esophageal dilatation.  PAST SURGICAL HISTORY:  Otherwise noncontributory.  MEDICATIONS PRIOR TO TODAY:  None.  ALLERGIES:  No known drug allergies.  SOCIAL HISTORY:  His wife passed six years ago from respiratory arrest.  She was status post a lung transplant.  He is a retired Furniture conservator/restorer.  He has remained active until the past year, playing golf.  In June, 2001, he discontinued golfing secondary to increased weakness.  He has a supportive son.  REVIEW OF SYSTEMS:  There has been no palpitations, no tachycardia, normal pedal  edema, no orthopnea, no PND, no change in bowel or bladder habits, no blood in his stool, no presyncope, no syncope.  Review of systems is otherwise negative.  PHYSICAL EXAMINATION:  GENERAL:  Reveals a middle-aged male in no acute distress.  VITAL SIGNS:  Blood pressure has ranged from 116-120/70-90.  His heart rate has ranged from 70-90.  He is afebrile.  His O2 saturation is more than 90% on room air.  HEENT:  Unremarkable.  No evidence of xanthelasmas.  NECK:  No neck vein distention, good carotid upstrokes.  Thyroid is nonpalpable.  PULMONARY:  No use of accessory muscles.  Breath sounds are equal and clear to auscultation.  CARDIOVASCULAR:  Reveals a normal S1, normal S2, a regular rate and rhythm. There are no rubs, murmurs, or gallops noted.  PMI is within normal limits.  ABDOMEN:  Soft.  There is some tenderness to deep palpation in the mid epigastric region.  No unusual bruits or pulsations noted.  Normal bowel sounds.  No unusual hepatosplenomegaly noted.  EXTREMITIES:  Reveal no peripheral edema.  His distal pulses are equal and palpable.  SKIN:  Warm and dry.  NEUROLOGIC:  Nonfocal.  Motor is 5/5 throughout.  LABORATORY DATA:  His ECG was reviewed, reveals a normal sinus rhythm with a bifascicular block including left anterior fascicular block and right bundle-branch block.  Stool was guaiaced by Dr. Jason Fila and is reportedly negative.  His chest x-ray reveals poor inspiratory effort.  Otherwise, normal chest x-ray.  IMPRESSION: 1. Chest pain with typical and atypical features for cardiac.  In view of his    ______ nature of the chest pain, he will be admitted.  Should he have    no further chest pain and normal cardiac enzymes, an adenosine    Cardiolite will be planned.  Should the patient have positive cardiac    enzymes, he will be taken directly for a left heart catheterization.    Continue with aspirin, beta blockers, and heparin therapy. 2. GERD,  possible gastritis.  The patient will be started on Protonix 40 mg    p.o. q.d. DD:  01/16/00 TD:  01/16/00 Job: [id number]

## 2010-08-18 NOTE — Discharge Summary (Signed)
NAME:  Fernando Moyer, Fernando Moyer NO.:  [id number]   MEDICAL RECORD NO.:  [id number]          PATIENT TYPE:  INP   LOCATION:  2915                         FACILITY:  [facility]   PHYSICIAN:  Salvadore Farber, MD  DATE OF BIRTH:  Dec 12, 1933   DATE OF ADMISSION:  02/27/2006  DATE OF DISCHARGE:                         DISCHARGE SUMMARY - REFERRING   DISCHARGE DIAGNOSES:  1. Dyspnea on exertion and chest discomfort of uncertain etiology.  2. Mild nonobstructive coronary artery disease with normal left      ventricular function.  3. Peripheral vascular disease, as described, with cardiac      catheterization.  4. History of hyperlipidemia.  5. Remote tobacco use.   PROCEDURES PERFORMED:  1. Cardiac catheterization.  2. Abdominal angiogram on February 27, 2006 by Dr. Samule Ohm.   PRIMARY CARE PHYSICIAN:  Willow Ora, M.D.   PRIMARY CARDIOLOGIST:  Salvadore Farber, M.D.   BRIEF HISTORY:  Fernando Moyer is a 77 year old white male who presents to  Dr. Melinda Crutch office on February 19, 2006 with a 4-year history of  exertional dyspnea accompanied with left-sided chest discomfort usually  relieved with rest.  However, over the last year, his exercise threshold  has diminished.  He also describes some right hip and calf discomfort  with walking.   PAST MEDICAL HISTORY:  1. Hyperlipidemia.  2. Remote tobacco.  3. Abdominal aortic aneurysm.  4. Right carotid bruit.   LABORATORY:  Chest x-ray from February 12, 2006 showed no active lung  disease.  Preadmission labs on the 20th showed a H&H of 15.6 and 46.0,  normal indices, platelets 307, WBCs 7.9.  PTT 26.5, PT 12.1.  Sodium  140, potassium 4.7, BUN 10, creatinine 0.9, glucose 117.  H&H prior to  discharge on the 29th was 14.0 and 40.3, normal indices, platelets 257,  WBCs 8.6.  Sodium 136, potassium 4.1, BUN 8, creatinine 0.7, glucose 99.   HOSPITAL COURSE:  Fernando Moyer was brought in for outpatient cardiac  catheterization as well as  lower extremity arteriogram by Dr. Samule Ohm for  further evaluation of his symptoms.  Cardiac catheterization was  performed on February 27, 2006 without difficulty.  According to the  progress note, Dr. Samule Ohm reports an EF of 70% without wall motion  abnormalities, heavy calcification of the mitral annulus but without  mitral regurgitation.  He has an 80% diagonal 1, 50% LAD at the diagonal  1 bifurcation, and 30% mid RCA.  He also notes a single right renal  artery with 30% stenosis.  He has 2 left renal arteries, both with  moderate stenosis and small infrarenal AAA, 60% ostial left CIA, and  occluded right CIA.  Dr. Samule Ohm felt that he had mild nonobstructive  coronary artery disease with mild mitral stenosis, normal LV function.  He felt that he should pursue pulmonary evaluation to further evaluate  his dyspnea.  Post-catheterization, the patient did develop a groin  hematoma which improved with manual compression.  CBCs were followed and  did not show any evidence of blood loss.  On the morning of the 29th,  catheterization site did show a  soft hematoma with slight tenderness and  ecchymosis but without bruit and positive pulses.  Dr. Samule Ohm felt that  he could be discharged home with outpatient followup.   DISPOSITION:  Fernando Moyer is discharged home and asked to maintain a low  fat, low cholesterol diet.  His activities and wound care are per the  supplemental discharge sheet.  He was asked to bring all medicines to  all appointments.   DISCHARGE MEDICATIONS:  1. Aspirin 81 mg daily.  2. Prilosec 20 mg daily.  3. Simvastatin 40 mg q.h.s.  4. He was asked not to continue his Augmentin as this causes diarrhea.   FOLLOW UP:  He will have an appointment for a carotid and abdominal  ultrasound in our office on March 05, 2006 between 9:30 and 11  o'clock.  He was advised nothing to eat or drink after midnight on  March 04, 2006.  He will then be seen by the P.A. for a groin  check on  March 05, 2006 at 11:15.  He will have PFTs and bronchodilator at the  first available appointment on March 21, 2006 at 11 a.m. at the [place]  office.  He will then follow up with Dr. Samule Ohm on the first available  appointment on April 04, 2006 at 3 p.m.      Joellyn Rued, PA-C      Salvadore Farber, MD  Electronically Signed    EW/MEDQ  D:  02/28/2006  T:  02/28/2006  Job:  [id number]   cc:   Willow Ora, MD  Salvadore Farber, MD

## 2010-08-18 NOTE — Assessment & Plan Note (Signed)
[facility]                          [place] OFFICE NOTE   Fernando Moyer, Fernando Moyer                     MRN:          [id number]  DATE:01/25/2006                            DOB:          [birth year]    HISTORY AND PHYSICAL:   CHIEF COMPLAINT:  Here to establish.   HISTORY OF PRESENT ILLNESS:  Fernando Moyer is a 77 year old gentleman who came  in for the first time to get established.  He is concerned about the Life  Screen results, also about headaches.  Recently the Life Screen tests show  carotid disease and also an abdominal aortic aneurysm measuring 3.2 x 3.2 x  3.2.  He also shows some vascular disease in the right leg.  His bone  density test screening was negative.  Total cholesterol was 187 with an LDL  of 105 and a blood sugar of 85.  In addition to that, for the last few  months he has been having some headaches, mostly frontal, on and off, almost  daily.  They last about 15 minutes and they get better with over-the-counter  medication.   PAST MEDICAL HISTORY:  1. Appendectomy.  2. GERD.  He reports an esophageal stricture in the past.  They have done      stretching many times before.  3. In early 2000 he had a colonoscopy, he believes with Dr. Jarold Motto.  4. He recalls having a treadmill back in 2004 but does not remember the      details.   FAMILY HISTORY:  Mother is deceased from cancer, type of cancer unknown.  Father is deceased.  He had a heart attack at age 41.  He also had some type  of cancer, but the type is unknown.  No diabetes or hypertension that he  recalls.  Specifically, he does not know if there was any prostate or colon  cancer in the family.   SOCIAL HISTORY:  The patient smoked 1-2 packs a day for many years, and he  quit in 1988.  He has been widowed for the last 12 years.  He has two  children.  One lives in [place].   REVIEW OF SYSTEMS:  Denies any fever or weight loss.  Sinus congestion.  No  chest pain.  He admits to dyspnea on exertion.  When I asked him about  claudication, he states that he has pain at the area of the right hip but no  calf pain.  He does not cough much, but denies any wheezing or morning  sputum.  No nausea, diarrhea or blood in the stools.  He does have often  dysphagia, which is chronic.  Denies any anxiety or depression.   MEDICATIONS:  None.   ALLERGIES:  No known drug allergies.   PHYSICAL EXAMINATION:  GENERAL:  The patient is alert, oriented, in no  apparent distress.  VITAL SIGNS:  He is 5 feet 8 inches tall.  He weighs 215.  Pulses 78,  respirations 18, blood pressure 184/60.  SKIN:  Does not look pale or icteric.  NECK:  The carotid pulses are normal and I  hear no bruit.  LUNGS:  Clear to auscultation with just decreased breath sounds.  CARDIOVASCULAR:  Regular rate and rhythm with a systolic murmur best heard  at the right upper sternal border.  ABDOMEN:  Not distended, soft, good bowel sounds.  I hear no bruits.  EXTREMITIES:  No edema.  He does have decreased bilateral femoral pulses.  DIGITAL RECTAL EXAM:  Quite enlarged prostate.  Not tender, not nodular.  Hemoccults are negative and the stools were brown.  NEUROLOGIC:  His speech, gait and motor are intact.   ASSESSMENT AND PLAN:  1. The patient has peripheral vascular disease by physical exam by the      screening with ultrasound.  He also has a heart murmur and the EKG is      not normal.  I will refer the patient to cardiology for further      evaluation.  2. The patient has dyspnea on exertion.  He is a former heavy smoker.      After the cardiology evaluation, will do pulmonary function tests.  3. History of gastroesophageal reflux disease, currently not on proton      pump inhibitors.  I recommend him to take Prilosec even though he does      not have heartburn.  I am planning to send him to GI later on.  4. Benign prostatic hypertrophy.  The patient is basically  asymptomatic.      He did admit to occasional difficulty urinating.  Will check a PSA and      a urinalysis.  5. He is recommended to have an aspirin every day.  6. He will come back fasting for a CBC, fasting lipid profile, BMP, LFTs,      PSA and a sedimentation rate.  7. Office visit here in 2 months.     Willow Ora, MD    JP/MedQ  DD: 01/25/2006  DT: 01/26/2006  Job #: [id number]

## 2010-08-29 ENCOUNTER — Telehealth: Payer: Self-pay | Admitting: Internal Medicine

## 2010-08-29 NOTE — Telephone Encounter (Signed)
Left detailed msg on voicemail it would be good idea to schedule office visit w/ Dr. Drue Novel.

## 2010-08-29 NOTE — Telephone Encounter (Signed)
Scheduled appt for patient for Thursday 5/31 at 9:00am

## 2010-08-30 ENCOUNTER — Other Ambulatory Visit: Payer: Self-pay | Admitting: Vascular Surgery

## 2010-08-30 ENCOUNTER — Ambulatory Visit
Admission: RE | Admit: 2010-08-30 | Discharge: 2010-08-30 | Disposition: A | Discharge status: Home / Self Care | Payer: Medicare Other | Source: Ambulatory Visit | Attending: Vascular Surgery | Admitting: Vascular Surgery

## 2010-08-30 DIAGNOSIS — R51 Headache: Secondary | ICD-10-CM

## 2010-08-30 MED ORDER — IOHEXOL 300 MG/ML  SOLN
75.0000 mL | Freq: Once | INTRAMUSCULAR | Status: AC | PRN
  Administered 2010-08-30: 75 mL via INTRAVENOUS

## 2010-08-31 ENCOUNTER — Ambulatory Visit (INDEPENDENT_AMBULATORY_CARE_PROVIDER_SITE_OTHER): Payer: Medicare Other | Admitting: Internal Medicine

## 2010-08-31 ENCOUNTER — Encounter: Payer: Self-pay | Admitting: Internal Medicine

## 2010-08-31 VITALS — BP 128/86 | HR 96 | Wt 212.6 lb

## 2010-08-31 DIAGNOSIS — R51 Headache: Secondary | ICD-10-CM

## 2010-08-31 LAB — CBC WITH DIFFERENTIAL/PLATELET
Basophils Absolute: 0 10*3/uL (ref 0.0–0.1)
Eosinophils Relative: 1.4 % (ref 0.0–5.0)
HCT: 44.9 % (ref 39.0–52.0)
Hemoglobin: 15.3 g/dL (ref 13.0–17.0)
Lymphocytes Relative: 14.4 % (ref 12.0–46.0)
Lymphs Abs: 1.5 10*3/uL (ref 0.7–4.0)
Monocytes Relative: 7.9 % (ref 3.0–12.0)
Neutro Abs: 8.1 10*3/uL — ABNORMAL HIGH (ref 1.4–7.7)
Platelets: 232 10*3/uL (ref 150.0–400.0)
RDW: 14.7 % — ABNORMAL HIGH (ref 11.5–14.6)
WBC: 10.7 10*3/uL — ABNORMAL HIGH (ref 4.5–10.5)

## 2010-08-31 LAB — SEDIMENTATION RATE: Sed Rate: 18 mm/hr (ref 0–22)

## 2010-08-31 MED ORDER — TRAMADOL HCL 50 MG PO TABS: 50.0000 mg | ORAL_TABLET | Freq: Four times a day (QID) | ORAL | Status: DC | PRN

## 2010-08-31 MED ORDER — AMOXICILLIN 500 MG PO CAPS: 1000.0000 mg | ORAL_CAPSULE | Freq: Two times a day (BID) | ORAL | Status: AC

## 2010-08-31 NOTE — Assessment & Plan Note (Addendum)
4 weeks history of steady headache, on chart review, he also had  headaches January 2011 , that particular episode self resolved. Recent CT of the head showed a new left-sided sinus disease. Etiology unclear to me at this point . It may be an episode of cluster headaches. it doesn't seem to be cervicogenic or related to PMR. S/e from lasix? (rare s/e) Plan: We'll check a sedimentation rate, if it is more than 100, we'll have to consider TA Ultram If not better in a few days, neurology referral Treat sinusitis with antibiotics understanding that the headache may not be related to sinus disease.

## 2010-08-31 NOTE — Progress Notes (Signed)
  Subjective:    Patient ID: Fernando Moyer, male    DOB: Jan 28, 1934, 77 y.o.   MRN: [id number]  HPI Had a right carotid surgery about 4 weeks ago, ever seen this is having a steady headache. The headache is there all the time and the intensity goes up and down, it may be intense at times. Is  located primarily at the R forehead but may also be on the left side and on the posterior right head. The only new medication he is taking is Lasix, he has been on Prevacid for a while. CT done yesterday showed no acute intracraneal  abnormality but left sided sinus disease. The pain does not change with neck motion he does not recall having similar pains before  Past Medical History  Diagnosis Date  . GERD with stricture     w/hx of stricture  . PVD (peripheral vascular disease) 12/07    per cath 12/07....iliac  . Carotid artery occlusion     last u/s 3-11...Marland Kitchenper vasc.surgery 2007 cath: nonobstructive CAD. mil;d MS, trivial AoS, small AAA,  . Dyspnea      w/u included a cath and PFT's, sxms thought to be pulmonary  . Bowen's disease     right arm BX: referred to dermatology  . Hyperlipidemia   . Emphysema     PFT 4/10 FVC 3.45 (90%), FEV1 2.72 (106%), TLC 5.36 (93%), DLCO 74%, NO BD RESPONSE: ? OF PSEUDONORMALIZATION OF PFT FINDINGS W/BOTH OBSTRUCTIVE AND RESTRICTIVE DEFECTS.  Marland Kitchen AAA (abdominal aortic aneurysm) 05/2008    PER CARDIAC CATH  . Aortic stenosis 05/2008    PER CARDIAC CATH, MILD, MITRAL STENOSIS  . Mitral stenosis 05/2008    PER CARDIAC CATH  . Coronary artery disease 05/2008    MILD, MEDICAL MANAGEMENT  . Pulmonary hypertension    Past Surgical History  Procedure Date  . Appendectomy   . Esophageal dilation   . Cardiac catheterization 02/27/06    DR. DOWNEY: CORONARY ANGIOGRAPHY, ABDOMINAL AORTOGRAPHY  . Endarterectomy 08-2010    Right carotid endarterectomy       Review of Systems  no fever or chills No nasal discharge or pressure other than normal an occasional runny  nose. No myalgias, no neck pain. No amaurosix fugax  He has lost 7 pounds since the surgery, he believes is due to a lack of appetite.    Objective:   Physical Exam  Constitutional: He is oriented to person, place, and time. He appears well-developed and well-nourished. No distress.  HENT:  Head: Normocephalic and atraumatic.  Right Ear: External ear normal.  Left Ear: External ear normal.  Mouth/Throat: No oropharyngeal exudate.       Face symmetric, not tender to palpation.  Eyes: EOM are normal.  Neck:       Slightly decreased neck range of motion, not tender to palpation in the paraspinal muscles  Cardiovascular:  Murmur: systolic murmur? Pulmonary/Chest: No respiratory distress. He has no wheezes. He has no rales.  Neurological: He is alert and oriented to person, place, and time.  Skin: He is not diaphoretic.          Assessment & Plan:

## 2010-08-31 NOTE — Patient Instructions (Addendum)
Take amoxicillin as prescribed Take Ultram as needed for headache Used to sample of  Qnasal 2 spray on each side of the nose daily until samples  gone Call me next week and let me know how you're doing. If the headache is not better, will send you  to see a specialist. Call anytime if  symptoms are worse

## 2010-09-01 ENCOUNTER — Telehealth: Payer: Self-pay | Admitting: *Deleted

## 2010-09-01 NOTE — Telephone Encounter (Signed)
Noted, thanks!

## 2010-09-01 NOTE — Telephone Encounter (Signed)
Message copied by Leanne Lovely on Fri Sep 01, 2010  1:17 PM ------      Message from: Fernando Moyer      Created: Fri Sep 01, 2010 12:53 PM       Advise  patient, good results, plan is the same

## 2010-09-01 NOTE — Telephone Encounter (Signed)
Message left for patient to return my call.  

## 2010-09-01 NOTE — Telephone Encounter (Signed)
Message copied by Leanne Lovely on Fri Sep 01, 2010  8:21 AM ------      Message from: Willow Ora E      Created: Thu Aug 31, 2010  5:04 PM       Check on him, HA better ??

## 2010-09-01 NOTE — Telephone Encounter (Signed)
Pt is aware.  

## 2010-09-01 NOTE — Telephone Encounter (Signed)
I spoke w/ pt he states that his headache is getting a lot better.

## 2010-09-08 ENCOUNTER — Other Ambulatory Visit: Payer: Medicare Other | Admitting: *Deleted

## 2010-10-06 ENCOUNTER — Other Ambulatory Visit (INDEPENDENT_AMBULATORY_CARE_PROVIDER_SITE_OTHER): Payer: Medicare Other | Admitting: *Deleted

## 2010-10-06 DIAGNOSIS — E7800 Pure hypercholesterolemia, unspecified: Secondary | ICD-10-CM

## 2010-10-06 LAB — HEPATIC FUNCTION PANEL
ALT: 11 U/L (ref 0–53)
AST: 26 U/L (ref 0–37)
Albumin: 4.2 g/dL (ref 3.5–5.2)

## 2010-10-06 LAB — LIPID PANEL
HDL: 30 mg/dL — ABNORMAL LOW (ref >39.00)
Triglycerides: 144 mg/dL (ref 0.0–149.0)
VLDL: 28.8 mg/dL (ref 0.0–40.0)

## 2011-01-12 ENCOUNTER — Encounter: Payer: Self-pay | Admitting: Vascular Surgery

## 2011-01-17 ENCOUNTER — Encounter: Payer: Self-pay | Admitting: Internal Medicine

## 2011-01-17 ENCOUNTER — Ambulatory Visit (INDEPENDENT_AMBULATORY_CARE_PROVIDER_SITE_OTHER): Payer: Medicare Other | Admitting: Internal Medicine

## 2011-01-17 DIAGNOSIS — J438 Other emphysema: Secondary | ICD-10-CM

## 2011-01-17 DIAGNOSIS — E785 Hyperlipidemia, unspecified: Secondary | ICD-10-CM

## 2011-01-17 DIAGNOSIS — R972 Elevated prostate specific antigen [PSA]: Secondary | ICD-10-CM

## 2011-01-17 DIAGNOSIS — N390 Urinary tract infection, site not specified: Secondary | ICD-10-CM

## 2011-01-17 DIAGNOSIS — N400 Enlarged prostate without lower urinary tract symptoms: Secondary | ICD-10-CM | POA: Insufficient documentation

## 2011-01-17 DIAGNOSIS — I2510 Atherosclerotic heart disease of native coronary artery without angina pectoris: Secondary | ICD-10-CM

## 2011-01-17 DIAGNOSIS — Z23 Encounter for immunization: Secondary | ICD-10-CM

## 2011-01-17 DIAGNOSIS — Z0000 Encounter for general adult medical examination without abnormal findings: Secondary | ICD-10-CM | POA: Insufficient documentation

## 2011-01-17 DIAGNOSIS — L719 Rosacea, unspecified: Secondary | ICD-10-CM

## 2011-01-17 LAB — POCT URINALYSIS DIPSTICK
Bilirubin, UA: NEGATIVE
Ketones, UA: NEGATIVE
pH, UA: 6

## 2011-01-17 MED ORDER — PRAVASTATIN SODIUM 40 MG PO TABS: 40.0000 mg | ORAL_TABLET | Freq: Every evening | ORAL | Status: DC

## 2011-01-17 MED ORDER — DOXYCYCLINE HYCLATE 100 MG PO TABS: 100.0000 mg | ORAL_TABLET | Freq: Two times a day (BID) | ORAL | Status: AC

## 2011-01-17 MED ORDER — CLINDAMYCIN PHOSPHATE 1 % EX GEL: Freq: Two times a day (BID) | CUTANEOUS | Status: DC

## 2011-01-17 NOTE — Assessment & Plan Note (Addendum)
Was evaluated  by pulmonary in 2010: "h/o emphysematous changes on his CT chest, and extensive history of smoking.  His FEV1/FVC ratio was normal on PFT from 07/09/08.  However he may have pseudonormalization of his FVC due to obesity causing a restrictive defect, and therefore may still have obstructive lung disease." He is doing very well from the respiratory standpoint

## 2011-01-17 NOTE — Progress Notes (Signed)
Subjective:    Patient ID: Fernando Moyer, male    DOB: [birth year], 77 y.o.   MRN: [id number]  HPI Here for Medicare AWV: 1. Risk factors based on Past M, S, F history: reviewed 2. Physical Activities: still works in a warehouse, active   3. Depression/mood: no problems noted or reported  4. Hearing: chronic decreased  Hearing and B tinnitus, s/p multiple ENT evals, declined referral 5. ADL's:  Still drives, independent  6. Fall Risk: no recent falls 7. home Safety: does feel safe at home  8. Height, weight, &visual acuity: see VS, vision ok 9. Counseling: provided 10. Labs ordered based on risk factors: if needed  11. Referral Coordination: if needed 12.  Care Plan, see assessment and plan  13.   Cognitive Assessment: Motor skills and cognition seemed intact  In addition, today we discussed the following: Cardiovascular disease, closely followed by cardiology and vascular surgery. Last cardiac note reviewed from April 2012. High cholesterol--decided not to take cholesterol medication. Patient also decided not to take Lasix. GERD--good compliance with medicines, no symptoms Rosacea--complains of nose retinitis are swelling for 6 weeks, slightly getting better.  Past Medical History  Diagnosis Date  . GERD with stricture     w/hx of stricture  . PVD (peripheral vascular disease) 12/07    per cath 12/07....iliac  . Carotid artery occlusion     last u/s 3-11...Marland Kitchenper vasc.surgery 2007 cath: nonobstructive CAD. mil;d MS, trivial AoS, small AAA,  . Dyspnea      w/u included a cath and PFT's, sxms thought to be pulmonary  . Bowen's disease     right arm BX: referred to dermatology  . Hyperlipidemia   . Emphysema     PFT 4/10 FVC 3.45 (90%), FEV1 2.72 (106%), TLC 5.36 (93%), DLCO 74%, NO BD RESPONSE: ? OF PSEUDONORMALIZATION OF PFT FINDINGS W/BOTH OBSTRUCTIVE AND RESTRICTIVE DEFECTS.  Marland Kitchen AAA (abdominal aortic aneurysm) 05/2008    PER CARDIAC CATH  . Aortic stenosis 05/2008    PER  CARDIAC CATH, MILD, MITRAL STENOSIS  . Mitral stenosis 05/2008    PER CARDIAC CATH  . Coronary artery disease 05/2008    MILD, MEDICAL MANAGEMENT  . Pulmonary hypertension   . COPD (chronic obstructive pulmonary disease)   . Hypertension   . Hypoxemia    Past Surgical History  Procedure Date  . Appendectomy   . Esophageal dilation   . Cardiac catheterization 02/27/06    DR. DOWNEY: CORONARY ANGIOGRAPHY, ABDOMINAL AORTOGRAPHY  . Endarterectomy 08-2010    Right carotid endarterectomy       History   Social History  . Marital Status: Widowed    Spouse Name: N/A    Number of Children: 2  . Years of Education: N/A   Occupational History  . 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 Smoking status: Former Smoker    Types: Cigarettes    Quit date: 04/02/1986  . Smokeless tobacco: Never Used  . Alcohol Use: No  . Drug Use: No  . Sexually Active: Not on file   Other Topics Concern  . 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 Heart attack Father 10    MI  . Diabetes Neg Hx   . Colon cancer Neg Hx   . Prostate cancer Neg Hx        Review of Systems Denies any dysuria or gross hematuria, no difficulty urinating No nausea, vomiting, diarrhea or blood in the stools Emphysema seems to be doing well, denies chest congestion or cough.    Objective:   Physical Exam  Constitutional: He appears well-developed and well-nourished. No distress.  HENT:  Head: Normocephalic and atraumatic.       Nose swollen, moderately red, he has several telangiectasias, no fluctuance  Neck: No thyromegaly  present.  Cardiovascular:       RRR, 3/6 systolic murmur  Pulmonary/Chest:       Slightly decreased breath sounds but otherwise normal.  Abdominal: Soft. He exhibits no distension. There is no tenderness. There is no rebound and no guarding.  Genitourinary: Guaiac negative stool.       External examination normal, rectal normal, prostate is quite enlarged, no nodularity, not tender, enlargement  seems to be worse on the left.  Musculoskeletal: He exhibits no edema.  Neurological: He is alert.  Skin: Skin is warm and dry. He is not diaphoretic.  Psychiatric: He has a normal mood and affect. His behavior is normal. Judgment and thought content normal.          Assessment & Plan:

## 2011-01-17 NOTE — Assessment & Plan Note (Addendum)
Was recommended pravastatin, decided not to take it. I discussed the patient benefits of pravastatin particularly his situation. Rx sent

## 2011-01-17 NOTE — Assessment & Plan Note (Addendum)
cardiovascular diseases are  followup by cardiology and cardiovascular surgery

## 2011-01-17 NOTE — Assessment & Plan Note (Signed)
Doxycycline for 10 days, will also start clinda gel

## 2011-01-17 NOTE — Assessment & Plan Note (Addendum)
Prostate is quite enlarged, he is asymptomatic. Plan: Check a PSA to r/o elevation; if it  is extremely high will refer to urology. Check a UA

## 2011-01-17 NOTE — Assessment & Plan Note (Addendum)
Td today pneumonia shot in 2007 per chart review Flu shot today Discussed shingles immunization  Reports 2 colonoscopies in the past, remotely. We'll contact GI and get reports, Hemoccult negative today Diet and exercise discussed All recent labs reviewed

## 2011-01-17 NOTE — Patient Instructions (Signed)
call Dr Excell Seltzer and get an appointment

## 2011-01-20 LAB — CULTURE, URINE COMPREHENSIVE: Colony Count: >=100000

## 2011-01-23 ENCOUNTER — Telehealth: Payer: Self-pay

## 2011-01-23 MED ORDER — CIPROFLOXACIN HCL 500 MG PO TABS: 500.0000 mg | ORAL_TABLET | Freq: Two times a day (BID) | ORAL | Status: AC

## 2011-01-23 NOTE — Telephone Encounter (Signed)
Pt aware and Rx sent to pharmacy  

## 2011-01-23 NOTE — Telephone Encounter (Signed)
Message copied by Beverely Low on Tue Jan 23, 2011 11:58 AM ------      Message from: Willow Ora E      Created: Mon Jan 22, 2011 10:52 AM       Advise patient      He does have a UTI, when he was here I prescribed doxycycline for a skin infection.      Plan:      Hold doxycycline      Cipro 500 mg 1 by mouth twice a day for 2 weeks, call #[phone], no refills      Urine culture in one month to be sure the infection is resolved

## 2011-02-01 HISTORY — PX: CARDIAC CATHETERIZATION: SHX172

## 2011-02-09 ENCOUNTER — Encounter: Payer: Self-pay | Admitting: Cardiovascular Disease

## 2011-02-09 ENCOUNTER — Ambulatory Visit (INDEPENDENT_AMBULATORY_CARE_PROVIDER_SITE_OTHER): Payer: Medicare Other | Admitting: Cardiovascular Disease

## 2011-02-09 ENCOUNTER — Telehealth: Payer: Self-pay | Admitting: Cardiovascular Disease

## 2011-02-09 VITALS — BP 98/54 | HR 95 | Ht 67.0 in | Wt 219.0 lb

## 2011-02-09 DIAGNOSIS — I739 Peripheral vascular disease, unspecified: Secondary | ICD-10-CM

## 2011-02-09 DIAGNOSIS — I2510 Atherosclerotic heart disease of native coronary artery without angina pectoris: Secondary | ICD-10-CM

## 2011-02-09 DIAGNOSIS — I359 Nonrheumatic aortic valve disorder, unspecified: Secondary | ICD-10-CM

## 2011-02-09 NOTE — Telephone Encounter (Signed)
New message:  Fernando Moyer father has an appt today but she is unable to come with him.  Can you give her a call back after his appt and let her know what the out come was?

## 2011-02-09 NOTE — Assessment & Plan Note (Signed)
The patient had moderate aortic stenosis by echocardiogram last year with a mean aortic valve gradient of 35 mm mercury. Recommend repeat an echocardiogram in May 2013. The patient has no symptoms of severe aortic stenosis at this time

## 2011-02-09 NOTE — Patient Instructions (Signed)
Your physician wants you to follow-up in: May, 2013.   You will receive a reminder letter in the mail two months in advance. If you don't receive a letter, please call our office to schedule the follow-up appointment.  Your physician has requested that you have an echocardiogram. Echocardiography is a painless test that uses sound waves to create images of your heart. It provides your doctor with information about the size and shape of your heart and how well your heart's chambers and valves are working. This procedure takes approximately one hour. There are no restrictions for this procedure.  This is to be scheduled for April, 2013.

## 2011-02-09 NOTE — Assessment & Plan Note (Signed)
The patient is stable without angina. Will continue his current medical program.

## 2011-02-09 NOTE — Telephone Encounter (Signed)
I left a message for Melissa to call back.

## 2011-02-09 NOTE — Assessment & Plan Note (Signed)
The patient's peripheral arterial disease is stable. 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 We'll continue with medical treatment and ongoing efforts at risk reduction.

## 2011-02-09 NOTE — Progress Notes (Signed)
HPI:  [name] presents for followup evaluation. He is a 77 year old gentleman with mixed valvular heart disease, right iliac occlusion, and bilateral carotid stenosis. He has mild to moderate coronary artery disease without high-grade stenosis by cardiac catheterization in 2010. He denies anginal symptoms. He specifically denies chest pain or pressure. The patient has chronic shortness of breath with exertion and reports no recent change in this. He has right leg pain with walking and this occurs in the hip, knee, and calf muscles. This is also unchanged over time. He denies rest pain in the leg. He has no orthopnea, PND, palpitations, or edema.  Outpatient Encounter Prescriptions as of 02/09/2011  Medication Sig Dispense Refill  . aspirin EC 81 MG EC tablet Take 1 tablet (81 mg total) by mouth daily.      . clindamycin (CLINDAGEL) 1 % gel Apply topically 2 (two) times daily.  30 g  6  . lansoprazole (PREVACID) 15 MG capsule Take 15 mg by mouth daily.        . nitroGLYCERIN (NITROSTAT) 0.4 MG SL tablet Place 0.4 mg under the tongue every 5 (five) minutes as needed.        . pravastatin (PRAVACHOL) 40 MG tablet Take 1 tablet (40 mg total) by mouth every evening.  30 tablet  11    No Known Allergies  Past Medical History  Diagnosis Date  . GERD with stricture     w/hx of stricture  . PVD (peripheral vascular disease) 12/07    per cath 12/07....iliac  . Carotid artery occlusion     last u/s 3-11...Marland Kitchenper vasc.surgery 2007 cath: nonobstructive CAD. mil;d MS, trivial AoS, small AAA,  . Dyspnea      w/u included a cath and PFT's, sxms thought to be pulmonary  . Bowen's disease     right arm BX: referred to dermatology  . Hyperlipidemia   . Emphysema     PFT 4/10 FVC 3.45 (90%), FEV1 2.72 (106%), TLC 5.36 (93%), DLCO 74%, NO BD RESPONSE: ? OF PSEUDONORMALIZATION OF PFT FINDINGS W/BOTH OBSTRUCTIVE AND RESTRICTIVE DEFECTS.  Marland Kitchen AAA (abdominal aortic aneurysm) 05/2008    PER CARDIAC CATH  . Aortic  stenosis 05/2008    PER CARDIAC CATH, MILD, MITRAL STENOSIS  . Mitral stenosis 05/2008    PER CARDIAC CATH  . Coronary artery disease 05/2008    MILD, MEDICAL MANAGEMENT  . Pulmonary hypertension   . COPD (chronic obstructive pulmonary disease)   . Hypertension   . Hypoxemia     ROS: Negative except as per HPI  There were no vitals taken for this visit.  PHYSICAL EXAM: Pt is alert and oriented, elderly male in NAD HEENT: normal Neck: JVP - normal, carotids 2+= with bilateral bruits Lungs: CTA bilaterally CV: RRR with grade 3/6 harsh systolic murmur best heard at the left lower sternal border and apex Abd: soft, NT, Positive BS, no hepatomegaly, obese Ext: no C/C/E Skin: warm/dry no rash  EKG:  Normal sinus rhythm with PACs, right bundle branch block, left anterior fascicular block, voltage criteria for LVH.  ASSESSMENT AND PLAN:

## 2011-02-12 NOTE — Telephone Encounter (Signed)
Spoke with Fernando Moyer patient's grand daughter. Information of the last Friday's patient office visit given, Fernando Moyer verbalized understanding.

## 2011-02-12 NOTE — Telephone Encounter (Signed)
Follow  Up for Previous Call:   Per Melissa calling back to speak with nurse regarding her Grandfather office visit on Friday.

## 2011-02-21 ENCOUNTER — Other Ambulatory Visit: Payer: Self-pay

## 2011-02-21 ENCOUNTER — Emergency Department (HOSPITAL_COMMUNITY): Payer: Medicare Other

## 2011-02-21 ENCOUNTER — Inpatient Hospital Stay (HOSPITAL_COMMUNITY)
Admission: EM | Admit: 2011-02-21 | Discharge: 2011-02-24 | DRG: 280 | Disposition: A | Discharge status: Home / Self Care | Payer: Medicare Other | Source: Ambulatory Visit | Attending: Cardiology | Admitting: Cardiology

## 2011-02-21 ENCOUNTER — Other Ambulatory Visit (INDEPENDENT_AMBULATORY_CARE_PROVIDER_SITE_OTHER): Payer: Medicare Other

## 2011-02-21 ENCOUNTER — Other Ambulatory Visit: Payer: Self-pay | Admitting: Internal Medicine

## 2011-02-21 ENCOUNTER — Encounter (HOSPITAL_COMMUNITY): Payer: Self-pay

## 2011-02-21 DIAGNOSIS — Z7982 Long term (current) use of aspirin: Secondary | ICD-10-CM

## 2011-02-21 DIAGNOSIS — I714 Abdominal aortic aneurysm, without rupture, unspecified: Secondary | ICD-10-CM | POA: Insufficient documentation

## 2011-02-21 DIAGNOSIS — I214 Non-ST elevation (NSTEMI) myocardial infarction: Secondary | ICD-10-CM | POA: Diagnosis present

## 2011-02-21 DIAGNOSIS — I10 Essential (primary) hypertension: Secondary | ICD-10-CM | POA: Diagnosis present

## 2011-02-21 DIAGNOSIS — R000 Tachycardia, unspecified: Secondary | ICD-10-CM

## 2011-02-21 DIAGNOSIS — R079 Chest pain, unspecified: Secondary | ICD-10-CM | POA: Diagnosis present

## 2011-02-21 DIAGNOSIS — I4892 Unspecified atrial flutter: Secondary | ICD-10-CM | POA: Diagnosis present

## 2011-02-21 DIAGNOSIS — I4891 Unspecified atrial fibrillation: Secondary | ICD-10-CM

## 2011-02-21 DIAGNOSIS — K219 Gastro-esophageal reflux disease without esophagitis: Secondary | ICD-10-CM | POA: Insufficient documentation

## 2011-02-21 DIAGNOSIS — I350 Nonrheumatic aortic (valve) stenosis: Secondary | ICD-10-CM | POA: Insufficient documentation

## 2011-02-21 DIAGNOSIS — Z79899 Other long term (current) drug therapy: Secondary | ICD-10-CM

## 2011-02-21 DIAGNOSIS — F17200 Nicotine dependence, unspecified, uncomplicated: Secondary | ICD-10-CM | POA: Diagnosis present

## 2011-02-21 DIAGNOSIS — J438 Other emphysema: Secondary | ICD-10-CM | POA: Diagnosis present

## 2011-02-21 DIAGNOSIS — N390 Urinary tract infection, site not specified: Secondary | ICD-10-CM

## 2011-02-21 DIAGNOSIS — I2510 Atherosclerotic heart disease of native coronary artery without angina pectoris: Secondary | ICD-10-CM | POA: Insufficient documentation

## 2011-02-21 DIAGNOSIS — R319 Hematuria, unspecified: Secondary | ICD-10-CM

## 2011-02-21 DIAGNOSIS — I050 Rheumatic mitral stenosis: Secondary | ICD-10-CM | POA: Insufficient documentation

## 2011-02-21 DIAGNOSIS — I5031 Acute diastolic (congestive) heart failure: Secondary | ICD-10-CM | POA: Diagnosis present

## 2011-02-21 DIAGNOSIS — Z8249 Family history of ischemic heart disease and other diseases of the circulatory system: Secondary | ICD-10-CM

## 2011-02-21 DIAGNOSIS — E785 Hyperlipidemia, unspecified: Secondary | ICD-10-CM | POA: Insufficient documentation

## 2011-02-21 DIAGNOSIS — I2789 Other specified pulmonary heart diseases: Secondary | ICD-10-CM | POA: Insufficient documentation

## 2011-02-21 DIAGNOSIS — I739 Peripheral vascular disease, unspecified: Secondary | ICD-10-CM | POA: Diagnosis present

## 2011-02-21 DIAGNOSIS — I080 Rheumatic disorders of both mitral and aortic valves: Secondary | ICD-10-CM | POA: Diagnosis present

## 2011-02-21 HISTORY — DX: Polyneuropathy, unspecified: G62.9

## 2011-02-21 HISTORY — DX: Other chronic pain: G89.29

## 2011-02-21 HISTORY — DX: Dorsalgia, unspecified: M54.9

## 2011-02-21 LAB — POCT URINALYSIS DIPSTICK
Glucose, UA: NEGATIVE
Leukocytes, UA: NEGATIVE
Nitrite, UA: NEGATIVE
Urobilinogen, UA: 0.2

## 2011-02-21 LAB — MRSA PCR SCREENING: MRSA by PCR: NEGATIVE

## 2011-02-21 LAB — CBC
HCT: 45.3 % (ref 39.0–52.0)
MCH: 30 pg (ref 26.0–34.0)
MCHC: 34.9 g/dL (ref 30.0–36.0)
MCV: 86.1 fL (ref 78.0–100.0)
RDW: 14 % (ref 11.5–15.5)
WBC: 9.6 10*3/uL (ref 4.0–10.5)

## 2011-02-21 LAB — POCT I-STAT, CHEM 8
Creatinine, Ser: 0.9 mg/dL (ref 0.50–1.35)
Glucose, Bld: 118 mg/dL — ABNORMAL HIGH (ref 70–99)
Hemoglobin: 16.3 g/dL (ref 13.0–17.0)
TCO2: 27 mmol/L (ref 0–100)

## 2011-02-21 LAB — POCT I-STAT TROPONIN I

## 2011-02-21 LAB — CARDIAC PANEL(CRET KIN+CKTOT+MB+TROPI)
Relative Index: 6.3 — ABNORMAL HIGH (ref 0.0–2.5)
Relative Index: 6.3 — ABNORMAL HIGH (ref 0.0–2.5)
Total CK: 163 U/L (ref 7–232)
Troponin I: 2.06 ng/mL (ref <0.30)

## 2011-02-21 MED ORDER — CLINDAMYCIN PHOSPHATE 1 % EX GEL
Freq: Two times a day (BID) | CUTANEOUS | Status: DC
  Administered 2011-02-23: 22:00:00 via TOPICAL

## 2011-02-21 MED ORDER — SIMVASTATIN 20 MG PO TABS
20.0000 mg | ORAL_TABLET | Freq: Every day | ORAL | Status: DC
  Administered 2011-02-22 – 2011-02-23 (×2): 20 mg via ORAL
  Filled 2011-02-21 (×3): qty 1

## 2011-02-21 MED ORDER — FENTANYL CITRATE 0.05 MG/ML IJ SOLN: 50.0000 ug | Freq: Once | INTRAMUSCULAR | Status: DC

## 2011-02-21 MED ORDER — ACETAMINOPHEN 325 MG PO TABS: 650.0000 mg | ORAL_TABLET | ORAL | Status: DC | PRN

## 2011-02-21 MED ORDER — ONDANSETRON HCL 4 MG/2ML IJ SOLN: 4.0000 mg | Freq: Four times a day (QID) | INTRAMUSCULAR | Status: DC | PRN

## 2011-02-21 MED ORDER — DILTIAZEM HCL 100 MG IV SOLR
5.0000 mg/h | Freq: Once | INTRAVENOUS | Status: AC
  Administered 2011-02-21: 5 mg/h via INTRAVENOUS

## 2011-02-21 MED ORDER — DILTIAZEM HCL 100 MG IV SOLR
5.0000 mg/h | INTRAVENOUS | Status: DC
  Filled 2011-02-21: qty 100

## 2011-02-21 MED ORDER — ASPIRIN 300 MG RE SUPP
300.0000 mg | RECTAL | Status: AC
  Filled 2011-02-21: qty 1

## 2011-02-21 MED ORDER — NITROGLYCERIN 0.4 MG SL SUBL
0.4000 mg | SUBLINGUAL_TABLET | SUBLINGUAL | Status: DC | PRN
  Filled 2011-02-21: qty 25

## 2011-02-21 MED ORDER — SODIUM CHLORIDE 0.9 % IV BOLUS (SEPSIS)
500.0000 mL | Freq: Once | INTRAVENOUS | Status: AC
  Administered 2011-02-21: 500 mL via INTRAVENOUS

## 2011-02-21 MED ORDER — MAGNESIUM SULFATE 50 % IJ SOLN
1.0000 g | Freq: Once | INTRAMUSCULAR | Status: DC
  Filled 2011-02-21: qty 2

## 2011-02-21 MED ORDER — CLINDAMYCIN PHOSPHATE 1 % EX GEL
Freq: Two times a day (BID) | CUTANEOUS | Status: DC
  Filled 2011-02-21 (×7): qty 1

## 2011-02-21 MED ORDER — MAGNESIUM SULFATE IN D5W 10-5 MG/ML-% IV SOLN
1.0000 g | INTRAVENOUS | Status: AC
  Administered 2011-02-21: 1 g via INTRAVENOUS
  Filled 2011-02-21: qty 100

## 2011-02-21 MED ORDER — SODIUM CHLORIDE 0.9 % IV SOLN
INTRAVENOUS | Status: DC
  Administered 2011-02-21 – 2011-02-22 (×2): via INTRAVENOUS

## 2011-02-21 MED ORDER — ADENOSINE 6 MG/2ML IV SOLN
6.0000 mg | Freq: Once | INTRAVENOUS | Status: AC
  Administered 2011-02-21: 6 mg via INTRAVENOUS
  Filled 2011-02-21: qty 8

## 2011-02-21 MED ORDER — SODIUM CHLORIDE 0.9 % IJ SOLN
3.0000 mL | Freq: Two times a day (BID) | INTRAMUSCULAR | Status: DC
  Administered 2011-02-21 – 2011-02-24 (×4): 3 mL via INTRAVENOUS

## 2011-02-21 MED ORDER — ASPIRIN 325 MG PO TABS
325.0000 mg | ORAL_TABLET | Freq: Every day | ORAL | Status: DC
  Administered 2011-02-21 (×3): 325 mg via ORAL
  Filled 2011-02-21: qty 1

## 2011-02-21 MED ORDER — PANTOPRAZOLE SODIUM 40 MG PO TBEC
40.0000 mg | DELAYED_RELEASE_TABLET | Freq: Every day | ORAL | Status: DC
  Administered 2011-02-22 – 2011-02-24 (×3): 40 mg via ORAL
  Filled 2011-02-21 (×4): qty 1

## 2011-02-21 MED ORDER — ASPIRIN EC 81 MG PO TBEC
81.0000 mg | DELAYED_RELEASE_TABLET | Freq: Every day | ORAL | Status: DC
  Administered 2011-02-22 – 2011-02-24 (×2): 81 mg via ORAL
  Filled 2011-02-21 (×3): qty 1

## 2011-02-21 MED ORDER — METOPROLOL TARTRATE 12.5 MG HALF TABLET
12.5000 mg | ORAL_TABLET | Freq: Two times a day (BID) | ORAL | Status: DC
  Administered 2011-02-21: 12.5 mg via ORAL
  Filled 2011-02-21 (×3): qty 1

## 2011-02-21 MED ORDER — SODIUM CHLORIDE 0.9 % IJ SOLN: 3.0000 mL | INTRAMUSCULAR | Status: DC | PRN

## 2011-02-21 MED ORDER — HEPARIN (PORCINE) IN NACL 100-0.45 UNIT/ML-% IJ SOLN
1450.0000 [IU]/h | INTRAMUSCULAR | Status: DC
  Administered 2011-02-21: 1200 [IU]/h via INTRAVENOUS
  Administered 2011-02-22: 1450 [IU]/h via INTRAVENOUS
  Filled 2011-02-21 (×4): qty 250

## 2011-02-21 MED ORDER — HEPARIN BOLUS VIA INFUSION
4000.0000 [IU] | Freq: Once | INTRAVENOUS | Status: AC
  Administered 2011-02-21: 4000 [IU] via INTRAVENOUS
  Filled 2011-02-21: qty 4000

## 2011-02-21 NOTE — ED Provider Notes (Signed)
History     CSN: [id number] Arrival date & time: 02/21/2011  2:35 PM   First MD Initiated Contact with Patient 02/21/11 1459      Chief Complaint  Patient presents with  . Chest Pain    (Consider location/radiation/quality/duration/timing/severity/associated sxs/prior treatment) Patient is a 77 y.o. male presenting with chest pain. The history is provided by the patient.  Chest Pain The chest pain began 2 days ago. Chest pain occurs constantly. The chest pain is worsening. The pain is associated with breathing. At its most intense, the pain is at 5/10. The pain is currently at 5/10. The quality of the pain is described as dull. The pain does not radiate. Exacerbated by: nothing. Primary symptoms include shortness of breath and palpitations. Pertinent negatives for primary symptoms include no fever, no fatigue, no syncope, no wheezing, no abdominal pain, no nausea, no vomiting, no dizziness and no altered mental status.  The palpitations also occurred with shortness of breath. The palpitations did not occur with dizziness.   Pertinent negatives for associated symptoms include no lower extremity edema and no near-syncope. He tried nothing for the symptoms. Risk factors include being elderly and male gender.  Pertinent negatives for past medical history include no congenital heart disease, no connective tissue disease, no Marfan's syndrome and no seizures.  Pertinent negatives for family medical history include: family history of aortic dissection.  Procedure history is negative for cardiac catheterization.     Past Medical History  Diagnosis Date  . GERD with stricture     w/hx of stricture  . PVD (peripheral vascular disease) 12/07    per cath 12/07....iliac  . Carotid artery occlusion     last u/s 3-11...Marland Kitchenper vasc.surgery 2007 cath: nonobstructive CAD. mil;d MS, trivial AoS, small AAA,  . Dyspnea      w/u included a cath and PFT's, sxms thought to be pulmonary  . Bowen's disease      right arm BX: referred to dermatology  . Hyperlipidemia   . Emphysema     PFT 4/10 FVC 3.45 (90%), FEV1 2.72 (106%), TLC 5.36 (93%), DLCO 74%, NO BD RESPONSE: ? OF PSEUDONORMALIZATION OF PFT FINDINGS W/BOTH OBSTRUCTIVE AND RESTRICTIVE DEFECTS.  Marland Kitchen AAA (abdominal aortic aneurysm) 05/2008    PER CARDIAC CATH  . Aortic stenosis 05/2008    PER CARDIAC CATH, MILD, MITRAL STENOSIS  . Mitral stenosis 05/2008    PER CARDIAC CATH  . Coronary artery disease 05/2008    MILD, MEDICAL MANAGEMENT  . Pulmonary hypertension   . COPD (chronic obstructive pulmonary disease)   . Hypertension   . Hypoxemia     Past Surgical History  Procedure Date  . Appendectomy   . Esophageal dilation   . Cardiac catheterization 02/27/06    DR. DOWNEY: CORONARY ANGIOGRAPHY, ABDOMINAL AORTOGRAPHY  . Endarterectomy 08-2010    Right carotid endarterectomy      Family History  Problem Relation Age of Onset  . Heart attack Father 69    MI  . Diabetes Neg Hx   . Colon cancer Neg Hx   . Prostate cancer Neg Hx     History  Substance Use Topics  . Smoking status: Former Smoker    Types: Cigarettes    Quit date: 04/02/1986  . Smokeless tobacco: Never Used  . Alcohol Use: No      Review of Systems  Constitutional: Negative for fever and fatigue.  Respiratory: Positive for shortness of breath. Negative for wheezing.   Cardiovascular: Positive for chest pain and  palpitations. Negative for syncope and near-syncope.  Gastrointestinal: Negative for nausea, vomiting, abdominal pain and abdominal distention.  Genitourinary: Negative for difficulty urinating.  Musculoskeletal: Positive for arthralgias.  Skin: Negative for color change.  Neurological: Negative for dizziness and seizures.  Hematological: Negative for adenopathy.  Psychiatric/Behavioral: Positive for agitation. Negative for altered mental status.    Allergies  Review of patient's allergies indicates no known allergies.  Home Medications    Current Outpatient Rx  Name Route Sig Dispense Refill  . ASPIRIN EC 81 MG PO TBEC Oral Take 1 tablet (81 mg total) by mouth daily.    Marland Kitchen OMEPRAZOLE 20 MG PO CPDR Oral Take 20 mg by mouth daily.      . OXYCODONE-ACETAMINOPHEN 5-325 MG PO TABS Oral Take 1 tablet by mouth every 4 (four) hours as needed. For pain     . PRAVASTATIN SODIUM 40 MG PO TABS Oral Take 1 tablet (40 mg total) by mouth every evening. 30 tablet 11  . CLINDAMYCIN PHOSPHATE 1 % EX GEL Topical Apply topically 2 (two) times daily. 30 g 6  . NITROGLYCERIN 0.4 MG SL SUBL Sublingual Place 0.4 mg under the tongue every 5 (five) minutes as needed.        BP 140/90  Pulse 157  Temp(Src) 97.6 F (36.4 C) (Oral)  Resp 21  Ht 5\' 7"  (1.702 m)  Wt 217 lb (98.431 kg)  BMI 33.99 kg/m2  SpO2 96%  Physical Exam  Constitutional: He is oriented to person, place, and time. He appears well-developed and well-nourished.  HENT:  Head: Normocephalic and atraumatic.  Mouth/Throat: Oropharynx is clear and moist. No oropharyngeal exudate.  Eyes: EOM are normal. Pupils are equal, round, and reactive to light. Right eye exhibits no discharge. Left eye exhibits no discharge.  Neck: Normal range of motion. Neck supple. No JVD present.  Cardiovascular: Regular rhythm and normal pulses.  Tachycardia present.   Pulmonary/Chest: Effort normal and breath sounds normal. No respiratory distress.  Abdominal: Soft. There is no tenderness. There is no guarding.  Musculoskeletal: Normal range of motion. He exhibits no edema.  Neurological: He is alert and oriented to person, place, and time.  Skin: Skin is warm and dry. He is not diaphoretic.  Psychiatric: Thought content normal.    ED Course  Procedures (including critical care time)   Labs Reviewed  CBC  I-STAT, CHEM 8  I-STAT TROPONIN I   No results found.   No diagnosis found.  Results for orders placed during the hospital encounter of 02/21/11  CBC      Component Value Range   WBC  9.6  4.0 - 10.5 (K/uL)   RBC 5.26  4.22 - 5.81 (MIL/uL)   Hemoglobin 15.8  13.0 - 17.0 (g/dL)   HCT 21.3  08.6 - 57.8 (%)   MCV 86.1  78.0 - 100.0 (fL)   MCH 30.0  26.0 - 34.0 (pg)   MCHC 34.9  30.0 - 36.0 (g/dL)   RDW 46.9  62.9 - 52.8 (%)   Platelets 243  150 - 400 (K/uL)  POCT I-STAT, CHEM 8      Component Value Range   Sodium 140  135 - 145 (mEq/L)   Potassium 4.5  3.5 - 5.1 (mEq/L)   Chloride 104  96 - 112 (mEq/L)   BUN 12  6 - 23 (mg/dL)   Creatinine, Ser 4.13  0.50 - 1.35 (mg/dL)   Glucose, Bld 244 (*) 70 - 99 (mg/dL)   Calcium, Ion 0.10  1.12 - 1.32 (mmol/L)   TCO2 27  0 - 100 (mmol/L)   Hemoglobin 16.3  13.0 - 17.0 (g/dL)   HCT 40.9  81.1 - 91.4 (%)  POCT I-STAT TROPONIN I      Component Value Range   Troponin i, poc 1.12 (*) 0.00 - 0.08 (ng/mL)   Comment NOTIFIED PHYSICIAN     Comment 3            Dg Chest Port 1 View  02/21/2011  *RADIOLOGY REPORT*  Clinical Data: Left-sided chest pains.  PORTABLE CHEST - 1 VIEW  Comparison: 08/03/2010  Findings: Portable view of the chest demonstrates prominent basilar interstitial densities.  Findings are concerning for basilar edema or atelectasis.  Heart size is within normal limits and the trachea is midline.  Upper lungs are clear.  IMPRESSION: Basilar interstitial densities. Findings are suspicious for basilar edema and/or atelectasis.  Original Report Authenticated By: Richarda Overlie, M.D.   MDM Reviewed: nursing note and vitals Interpretation: labs, ECG and x-ray Total time providing critical care: 30-74 minutes. This excludes time spent performing separately reportable procedures and services. Consults: cardiology     MDM   Date: 02/21/2011  Rate: 159  Rhythm: atrial fibrillation  QRS Axis: left  Intervals: indeterminant  ST/T Wave abnormalities: ST depressions inferiorly  Conduction Disutrbances:none  Narrative Interpretation:   Old EKG Reviewed: changes noted Adenosine reveal fib flutter    CRITICAL  CARE Performed by: Jasmine Awe   Total critical care time: 60 minutes  Critical care time was exclusive of separately billable procedures and treating other patients.  Critical care was necessary to treat or prevent imminent or life-threatening deterioration.  Critical care was time spent personally by me on the following activities: development of treatment plan with patient and/or surrogate as well as nursing, discussions with consultants, evaluation of patient's response to treatment, examination of patient, obtaining history from patient or surrogate, ordering and performing treatments and interventions, ordering and review of laboratory studies, ordering and review of radiographic studies, pulse oximetry and re-evaluation of patient's condition.    Jasmine Awe, MD 02/21/11 [phone]

## 2011-02-21 NOTE — Progress Notes (Signed)
12  

## 2011-02-21 NOTE — Progress Notes (Signed)
ANTICOAGULATION CONSULT NOTE - Initial Consult  Pharmacy Consult for Heparin Indication: Chest pain/aflutter   No Known Allergies  Patient Measurements: Height: 5\' 7"  (170.2 cm) Weight: 217 lb (98.431 kg) IBW/kg (Calculated) : 66.1  Heparin dosing weight: 87.4kg  Vital Signs: Temp: 97.6 F (36.4 C) (11/21 1438) Temp src: Oral (11/21 1438) BP: 103/48 mmHg (11/21 1600) Pulse Rate: 104  (11/21 1600)  Labs:  Basename 02/21/11 1558 02/21/11 1557  HGB 16.3 15.8  HCT 48.0 45.3  PLT -- 243  APTT -- --  LABPROT -- --  INR -- --  HEPARINUNFRC -- --  CREATININE 0.90 --  CKTOTAL -- --  CKMB -- --  TROPONINI -- --   Estimated Creatinine Clearance: 79.2 ml/min (by C-G formula based on Cr of 0.9).  Medical History: Past Medical History  Diagnosis Date  . GERD with stricture     w/hx of stricture  . PVD (peripheral vascular disease) 12/07    per cath 12/07....iliac  . Carotid artery occlusion     last u/s 3-11...Marland Kitchenper vasc.surgery 2007 cath: nonobstructive CAD. mil;d MS, trivial AoS, small AAA,  . Bowen's disease     right arm BX: referred to dermatology  . Hyperlipidemia   . Emphysema     PFT 4/10 FVC 3.45 (90%), FEV1 2.72 (106%), TLC 5.36 (93%), DLCO 74%, NO BD RESPONSE: ? OF PSEUDONORMALIZATION OF PFT FINDINGS W/BOTH OBSTRUCTIVE AND RESTRICTIVE DEFECTS.  Marland Kitchen AAA (abdominal aortic aneurysm) 05/2008    PER CARDIAC CATH  . Aortic stenosis 05/2008    PER CARDIAC CATH, MILD, MITRAL STENOSIS  . Mitral stenosis 05/2008    PER CARDIAC CATH  . Coronary artery disease 05/2008    MILD, MEDICAL MANAGEMENT  . Pulmonary hypertension   . COPD (chronic obstructive pulmonary disease)   . Hypoxemia   . Hyperlipidemia   . Hypertension 02/21/11    pt denies this hx  . Heart murmur   . GERD (gastroesophageal reflux disease)   . Dyspnea      w/u included a cath and PFT's, sxms thought to be pulmonary  . Chronic back pain   . Peripheral neuropathy     right thigh; "it's been that way for  years"   Assessment: Fernando Moyer to begin heparin for chest pain/aflutter with elevated troponin. Wt=98.4kg and CrCl=19ml/min.  Goal of Therapy:  Heparin level 0.3-0.7 units/ml   Plan:  1) Heparin bolus 4000 units x 1 2) Heparin drip at 1200 units/hr 3) 8 hour heparin level 4) Daily heparin level and CBC  Fredrik Rigger 02/21/2011,5:46 PM

## 2011-02-21 NOTE — ED Notes (Signed)
Floor unable to take report at this time.

## 2011-02-21 NOTE — ED Notes (Signed)
Diltiazem increaased to 10 mg/hr

## 2011-02-21 NOTE — ED Notes (Signed)
Pt presents with 2 day h/o L sided chest pain.  Pt reports pain has been constant and does not radiate.  +shortness of breath; pt denies any  Nausea.

## 2011-02-21 NOTE — H&P (Signed)
History and Physical   Patient ID: Fernando Moyer MRN: [id number], DOB/AGE: 1934-03-31   Admit date: 02/21/2011 Date of Consult: 02/21/2011   Primary Physician: Willow Ora, MD, MD Primary Cardiologist: Tonny Bollman, MD  Pt. Profile: Fernando Moyer is a 77 yo Caucasian male with PMHx significant for CAD (cath 2010 revealed diffuse disease, LAD 50% proximal and distal, D1 of 70%, circumflex patent, RCA 40-50%), mixed valvular heart disease (moderate-severe AS, mild MS on ECHO 04/12), grade 2 diastolic dysfunction (ECHO 04/12), AAA (3.3 x 3.2 cm infrarenal in 2010), right common iliac occlusion, COPD, HTN, HL, tobacco abuse (70 pack-year history), FHx CAD and GERD presenting with chest pain and tachycardia.   Problem List: Past Medical History  Diagnosis Date  . GERD with stricture     w/hx of stricture  . PVD (peripheral vascular disease) 12/07    per cath 12/07....iliac  . Carotid artery occlusion     last u/s 3-11...Marland Kitchenper vasc.surgery 2007 cath: nonobstructive CAD. mil;d MS, trivial AoS, small AAA,  . Dyspnea      w/u included a cath and PFT's, sxms thought to be pulmonary  . Bowen's disease     right arm BX: referred to dermatology  . Hyperlipidemia   . Emphysema     PFT 4/10 FVC 3.45 (90%), FEV1 2.72 (106%), TLC 5.36 (93%), DLCO 74%, NO BD RESPONSE: ? OF PSEUDONORMALIZATION OF PFT FINDINGS W/BOTH OBSTRUCTIVE AND RESTRICTIVE DEFECTS.  Marland Kitchen AAA (abdominal aortic aneurysm) 05/2008    PER CARDIAC CATH  . Aortic stenosis 05/2008    PER CARDIAC CATH, MILD, MITRAL STENOSIS  . Mitral stenosis 05/2008    PER CARDIAC CATH  . Coronary artery disease 05/2008    MILD, MEDICAL MANAGEMENT  . Pulmonary hypertension   . COPD (chronic obstructive pulmonary disease)   . Hypertension   . Hypoxemia     Past Surgical History  Procedure Date  . Appendectomy   . Esophageal dilation   . Cardiac catheterization 02/27/06    DR. DOWNEY: CORONARY ANGIOGRAPHY, ABDOMINAL AORTOGRAPHY  . Endarterectomy  08-2010    Right carotid endarterectomy       Allergies: No Known Allergies  HPI:   Patient reports experiencing sharp, left-sided chest pain yesterday evening while sitting at the dinner table. He had Nitroglycerin at home, but did not take any medications for pain. He was traveling today when his pain worsened rated at a 5/10 and presented to [facility] ED. He reports associated SOB and diaphoresis yesterday. Denies palpitations, lightheadedness, n/v, abdominal pain, chills or fever. He states that he has recently traveled for an extended period of time. Pt had recently seen Dr. Excell Seltzer earlier this month with stable anginal symptoms.   Upon presentation, CXR revealed basilar interstitial edema vs. Atelectasis, EKG revealed tachycardia at 159 bpm, with unchanged RBBB, LA fascicular block; POC TnI was elevated at 1.12. He was given Adenosine without reduction in HR. Started on Cardizem drip with better rate-control at 95-110 bpm. Also given, ASA 325mg .   Currently resting comfortably, in no acute distress. Denies chest pain, SOB, n/v, palpitations or diaphoresis.   Inpatient Medications:    . adenosine (ADENOCARD) IV  6 mg Intravenous Once  . aspirin  325 mg Oral Daily  . diltiazem (CARDIZEM) infusion  5-15 mg/hr Intravenous Once  . fentaNYL  50 mcg Intravenous Once  . sodium chloride  500 mL Intravenous Once   Prior to Admission medications   Medication Sig Start Date End Date Taking? Authorizing Provider  aspirin EC  81 MG EC tablet Take 1 tablet (81 mg total) by mouth daily. 07/25/10  Yes Micheline Chapman, MD  omeprazole (PRILOSEC) 20 MG capsule Take 20 mg by mouth daily.     Yes Historical Provider, MD  oxyCODONE-acetaminophen (PERCOCET) 5-325 MG per tablet Take 1 tablet by mouth every 4 (four) hours as needed. For pain    Yes Historical Provider, MD  pravastatin (PRAVACHOL) 40 MG tablet Take 1 tablet (40 mg total) by mouth every evening. 01/17/11 01/17/12 Yes Wanda Plump, MD  clindamycin  (CLINDAGEL) 1 % gel Apply topically 2 (two) times daily. 01/17/11 01/17/12  Wanda Plump, MD  nitroGLYCERIN (NITROSTAT) 0.4 MG SL tablet Place 0.4 mg under the tongue every 5 (five) minutes as needed.      Historical Provider, MD     Family History  Problem Relation Age of Onset  . Heart attack Father 38    MI  . Diabetes Neg Hx   . Colon cancer Neg Hx   . Prostate cancer Neg Hx      History   Social History  . Marital Status: Widowed    Spouse Name: N/A    Number of Children: 2  . Years of Education: N/A   Occupational History  . 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 Smoking status: Former Smoker    Types: Cigarettes    Quit date: 04/02/1986  . Smokeless tobacco: Never Used  . Alcohol Use: No  . Drug Use: No  . Sexually Active: Not on file   Other Topics Concern  . 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  Physical Exam: Blood pressure 140/90, pulse 157, temperature 97.6 F (36.4 C), temperature source Oral, resp. rate 21, height 5\' 7"  (1.702 m), weight 98.431 kg (217 lb), SpO2 96.00%.    General: Well developed, well nourished, in no acute distress. Head: Normocephalic, atraumatic,  sclera non-icteric  Neck: Negative for carotid bruits. JVD 8-9 cm Lungs: Clear bilaterally to auscultation without wheezes, rales, or rhonchi. Breathing is unlabored. Heart: RRR with S1 S2 II/VI systolic crescendo-decrescendo murmur at LUSB, II/VI diastolic murmur at apex. No rubs, or gallops appreciated. Abdomen: Soft, non-tender, non-distended with normoactive bowel sounds. No hepatomegaly. No rebound/guarding. No obvious abdominal masses. Msk:  Strength and tone appears normal for age. Extremities: No clubbing, cyanosis or edema.  Distal pedal pulses are 2+ and equal bilaterally. No femoral bruits auscultated Neuro: Alert and oriented X 3. Moves all extremities spontaneously. Psych:  Responds to questions appropriately with a normal affect.  Labs:  Results for Fernando Moyer, Fernando Moyer (MRN [id number]) as of 02/21/2011 15:46  Ref. Range 02/21/2011 08:37  Color, UA No range found orange  Specific Gravity, UA No range found 1.015  pH, UA  No range found 5.0  Glucose, UA Latest Range: NEGATIVE mg/dL neg  Bilirubin, UA No range found neg  Ketones, UA No range found small  Clarity, UA No range found clear  Protein, UA No range found trace  Urobilinogen, UA Latest Range: 0.0-1.0 mg/dL 0.2  Nitrite, UA No range found neg  Leukocytes, UA No range found neg  RBC, UA No range found small     Radiology/Studies: Dg Chest Port 1 View  02/21/2011  *RADIOLOGY REPORT*  Clinical Data: Left-sided chest pains.  PORTABLE CHEST - 1 VIEW  Comparison: 08/03/2010  Findings: Portable view of the chest demonstrates prominent basilar interstitial densities.  Findings are concerning for basilar edema or atelectasis.  Heart size is within normal limits and the trachea is midline.  Upper lungs are clear.  IMPRESSION: Basilar interstitial densities. Findings are suspicious for basilar edema and/or atelectasis.  Original Report Authenticated By: Richarda Overlie, M.D.    EKG: SVT (atrial flutter, 2:1), 159 bpm, RBBB, LA  fascicular block, LVH  Tele: A. Flutter 2:1, 3:1 with RVR, rate initially 160 now 90-110  ASSESSMENT AND PLAN:   1. Chest pain- Elevated TnI likely secondary to demand ischemia in the setting atrial flutter. Further supported by patient's significant CAD, vascular history, may need cardiac catheterization with possible PCI.  - admit to step-down - Heparin drip - Cycle CEs - continue ASA    2. Atrial flutter- currently rate-controlled on Cardizem.  - Magnesium sulfate 2gm - Continue Dilt. Drip - Lopressor 12.5mg  BID - Will monitor to see if converts to NSR - Will plan on TEE/DCCV if no conversion on Friday   3. AAA- pt normotensive without abdominal pain currently, would recommend reassessment of previously diagnosed AAA with abdominal ultrasound  Signed, ARGUELLO, ROGER , PA-C 02/21/2011, 3:50 PM    Patient seen with PA, agree with note.    Patient developed chest pain yesterday morning while waiting for the newspaper.  The pain never resolved.  He did not feel his heart racing.  Finally came to ER tonight. He was found to be in atrial flutter, rate 150-160.  He was started on diltiazem gtt, HR decreased to around 100.  Chest pain has essentially resolved.  Initial troponin was elevated to around 1.  On exam, patient had mildly elevated JVP and a 3/6 mid-peaking aortic area murmur with obscuring of S2.    Impression: Atrial flutter with RVR, chest pain, elevation in troponin.  1. Atrial flutter: Predisposed to this by underlying heart and lung disease.   - Rate control with IV diltiazem gtt - Add low dose metoprolol 12.5 mg q6 hrs - heparin gtt - If he does not come out of atrial flutter, will plan TEE-guided cardioversion (likely Friday).  I am not sure when the flutter began, but suspect at least present since yesterday morning.  2. Chest pain: Initial troponin around 1.  Possible demand ischemia due to atrial flutter with RVR in the setting of underlying moderate CAD and at  least moderate aortic stenosis.  Chest pain has resolved with rate control.  Will follow troponin trend and chest pain symptoms.  If significant ongoing troponin rise, may need left heart cath.  If he also needs TEE-guided cardioversion, will have to arrange scheduling appropriately (probably cath on Friday then TEE-cardioversion on Monday so anticoagulation does not have to be interrupted after DCCV). - Continue ASA/statin, add heparin gtt and beta blocker.  3. Aortic stenosis: Muffled S2 with mid-peaking aortic area murmur.  Need to  repeat echo when he is rate controlled to assess severity of AS.    Marca Ancona 02/21/2011 5:41 PM

## 2011-02-22 DIAGNOSIS — I4892 Unspecified atrial flutter: Secondary | ICD-10-CM | POA: Diagnosis present

## 2011-02-22 DIAGNOSIS — I214 Non-ST elevation (NSTEMI) myocardial infarction: Secondary | ICD-10-CM | POA: Diagnosis present

## 2011-02-22 DIAGNOSIS — I359 Nonrheumatic aortic valve disorder, unspecified: Secondary | ICD-10-CM

## 2011-02-22 LAB — CARDIAC PANEL(CRET KIN+CKTOT+MB+TROPI)
CK, MB: 11.1 ng/mL (ref 0.3–4.0)
CK, MB: 9.3 ng/mL (ref 0.3–4.0)
CK, MB: 8.2 ng/mL (ref 0.3–4.0)
Total CK: 140 U/L (ref 7–232)
Total CK: 160 U/L (ref 7–232)
Troponin I: 1.3 ng/mL (ref <0.30)

## 2011-02-22 LAB — BASIC METABOLIC PANEL
CO2: 25 mEq/L (ref 19–32)
Calcium: 9.1 mg/dL (ref 8.4–10.5)
Chloride: 106 mEq/L (ref 96–112)
Glucose, Bld: 115 mg/dL — ABNORMAL HIGH (ref 70–99)
Potassium: 4.3 mEq/L (ref 3.5–5.1)
Sodium: 140 mEq/L (ref 135–145)

## 2011-02-22 LAB — LIPID PANEL
Cholesterol: 148 mg/dL (ref 0–200)
HDL: 29 mg/dL — ABNORMAL LOW (ref >39)
LDL Cholesterol: 85 mg/dL (ref 0–99)
Total CHOL/HDL Ratio: 5.1 RATIO
Triglycerides: 171 mg/dL — ABNORMAL HIGH (ref <150)
VLDL: 34 mg/dL (ref 0–40)

## 2011-02-22 LAB — CBC
HCT: 42.7 % (ref 39.0–52.0)
Hemoglobin: 14.4 g/dL (ref 13.0–17.0)
MCHC: 33.7 g/dL (ref 30.0–36.0)
RBC: 4.91 MIL/uL (ref 4.22–5.81)
WBC: 9.3 10*3/uL (ref 4.0–10.5)

## 2011-02-22 MED ORDER — FUROSEMIDE 10 MG/ML IJ SOLN
40.0000 mg | Freq: Once | INTRAMUSCULAR | Status: AC
  Administered 2011-02-22: 40 mg via INTRAVENOUS
  Filled 2011-02-22: qty 4

## 2011-02-22 MED ORDER — HEPARIN BOLUS VIA INFUSION
2500.0000 [IU] | Freq: Once | INTRAVENOUS | Status: AC
  Administered 2011-02-22: 2500 [IU] via INTRAVENOUS
  Filled 2011-02-22: qty 2500

## 2011-02-22 MED ORDER — METOPROLOL TARTRATE 25 MG PO TABS
25.0000 mg | ORAL_TABLET | Freq: Two times a day (BID) | ORAL | Status: DC
  Administered 2011-02-22 – 2011-02-24 (×4): 25 mg via ORAL
  Filled 2011-02-22 (×5): qty 1

## 2011-02-22 MED ORDER — SODIUM CHLORIDE 0.9 % IV SOLN
INTRAVENOUS | Status: DC
  Administered 2011-02-23: 04:00:00 via INTRAVENOUS

## 2011-02-22 MED ORDER — DIAZEPAM 5 MG PO TABS
5.0000 mg | ORAL_TABLET | ORAL | Status: AC
  Administered 2011-02-23: 5 mg via ORAL
  Filled 2011-02-22: qty 1

## 2011-02-22 MED ORDER — OXYCODONE-ACETAMINOPHEN 5-325 MG PO TABS
2.0000 | ORAL_TABLET | Freq: Three times a day (TID) | ORAL | Status: DC | PRN
  Administered 2011-02-22 – 2011-02-23 (×3): 2 via ORAL
  Filled 2011-02-22 (×3): qty 2

## 2011-02-22 MED ORDER — SODIUM CHLORIDE 0.9 % IV SOLN
250.0000 mL | INTRAVENOUS | Status: DC
  Administered 2011-02-22: 250 mL via INTRAVENOUS

## 2011-02-22 MED ORDER — ASPIRIN 81 MG PO CHEW
324.0000 mg | CHEWABLE_TABLET | ORAL | Status: AC
  Administered 2011-02-23: 324 mg via ORAL
  Filled 2011-02-22: qty 4

## 2011-02-22 MED ORDER — HEPARIN (PORCINE) IN NACL 100-0.45 UNIT/ML-% IJ SOLN
1900.0000 [IU]/h | INTRAMUSCULAR | Status: DC
  Administered 2011-02-23: 1550 [IU]/h via INTRAVENOUS
  Filled 2011-02-22 (×3): qty 250

## 2011-02-22 MED ORDER — SODIUM CHLORIDE 0.9 % IJ SOLN
3.0000 mL | Freq: Two times a day (BID) | INTRAMUSCULAR | Status: DC
  Administered 2011-02-22: 3 mL via INTRAVENOUS

## 2011-02-22 MED ORDER — SODIUM CHLORIDE 0.9 % IJ SOLN: 3.0000 mL | INTRAMUSCULAR | Status: DC | PRN

## 2011-02-22 NOTE — Progress Notes (Deleted)
CRITICAL VALUE ALERT    Results for CURRIE, DENNIN (MRN [id number]) as of 02/22/2011 13:38  Ref. Range 02/22/2011 09:40  CK, MB Latest Range: 0.3-4.0 ng/mL 9.3 (HH)  CK Total Latest Range: 7-232 U/L 140  Troponin I Latest Range: <0.30 ng/mL 1.30 [facility])   Date of notification: 02/22/2011  Time of notification: 1340  Critical value read back:yes  Nurse who received alert:  Aayat Hajjar E. Blondine Hottel,RN  MD notified (1st page):  Herbie Baltimore  Time of first page:  1342  MD notified (2nd page):  Time of second page:  Responding MD: Herbie Baltimore  Time MD responded:  1345

## 2011-02-22 NOTE — Progress Notes (Signed)
ANTICOAGULATION CONSULT NOTE - Initial Consult  Pharmacy Consult for heparin Indication:chest pain/aflutter/NSTEMI  No Known Allergies  Patient Measurements: Height: 5\' 7"  (170.2 cm) Weight: 214 lb 1.1 oz (97.1 kg) IBW/kg (Calculated) : 66.1    Vital Signs: Temp: 98.2 F (36.8 C) (11/22 1604) Temp src: Oral (11/22 1604) BP: 108/66 mmHg (11/22 1604) Pulse Rate: 77  (11/22 1604)  Labs:  Basename 02/22/11 1548 02/22/11 1444 02/22/11 0940 02/22/11 0600 02/22/11 0530 02/22/11 0146 02/21/11 1558 02/21/11 1557  HGB -- -- -- 14.4 -- -- 16.3 --  HCT -- -- -- 42.7 -- -- 48.0 45.3  PLT -- -- -- 212 -- -- -- 243  APTT -- -- -- -- -- -- -- --  LABPROT -- -- -- -- -- -- -- --  INR -- -- -- -- -- -- -- --  HEPARINUNFRC 0.23* -- -- -- <0.10* -- -- --  CREATININE -- -- -- 0.82 -- -- 0.90 --  CKTOTAL -- 160 140 -- -- 152 -- --  CKMB -- 8.2* 9.3* -- -- 11.1* -- --  TROPONINI -- 1.24* 1.30* -- -- 2.28* -- --   Estimated Creatinine Clearance: 86.4 ml/min (by C-G formula based on Cr of 0.82).  Medical History: Past Medical History  Diagnosis Date  . GERD with stricture     w/hx of stricture  . PVD (peripheral vascular disease) 12/07    per cath 12/07....iliac  . Carotid artery occlusion     last u/s 3-11...Marland Kitchenper vasc.surgery 2007 cath: nonobstructive CAD. mil;d MS, trivial AoS, small AAA,  . Bowen's disease     right arm BX: referred to dermatology  . Hyperlipidemia   . Emphysema     PFT 4/10 FVC 3.45 (90%), FEV1 2.72 (106%), TLC 5.36 (93%), DLCO 74%, NO BD RESPONSE: ? OF PSEUDONORMALIZATION OF PFT FINDINGS W/BOTH OBSTRUCTIVE AND RESTRICTIVE DEFECTS.  Marland Kitchen AAA (abdominal aortic aneurysm) 05/2008    PER CARDIAC CATH  . Aortic stenosis 05/2008    PER CARDIAC CATH, MILD, MITRAL STENOSIS  . Mitral stenosis 05/2008    PER CARDIAC CATH  . Coronary artery disease 05/2008    MILD, MEDICAL MANAGEMENT  . Pulmonary hypertension   . COPD (chronic obstructive pulmonary disease)   . Hypoxemia   .  Hyperlipidemia   . Hypertension 02/21/11    pt denies this hx  . Heart murmur   . GERD (gastroesophageal reflux disease)   . Dyspnea      w/u included a cath and PFT's, sxms thought to be pulmonary  . Chronic back pain   . Peripheral neuropathy     right thigh; "it's been that way for years"    Medications:  Scheduled:    . aspirin EC  81 mg Oral Daily  . aspirin  300 mg Rectal NOW  . clindamycin   Topical BID  . furosemide  40 mg Intravenous Once  . heparin  2,500 Units Intravenous Once  . heparin  4,000 Units Intravenous Once  . magnesium sulfate IVPB  1 g Intravenous To Major  . metoprolol tartrate  25 mg Oral BID  . pantoprazole  40 mg Oral Q1200  . simvastatin  20 mg Oral q1800  . sodium chloride  3 mL Intravenous Q12H  . DISCONTD: clindamycin   Topical BID  . DISCONTD: fentaNYL  50 mcg Intravenous Once  . DISCONTD: magnesium sulfate  1 g Intravenous Once  . DISCONTD: metoprolol tartrate  12.5 mg Oral BID    Assessment: 77 yo M with  CP/aflutter/NSTEMI for cath tomorrow.  Heparin level slightly subtherapeutic.  No complications noted.  Goal of Therapy:  Heparin level 0.3-0.7 units/ml   Plan:  1.  Increase heparin to 1550 units/hr 2.  Check 8h heparin level  Taz Vanness L. Illene Bolus, PharmD, BCPS Clinical Pharmacist Pager: [phone] 02/22/2011 5:34 PM

## 2011-02-22 NOTE — Progress Notes (Signed)
*  PRELIMINARY RESULTS* Echocardiogram 2D Echocardiogram has been performed.  Glean Salen [facility] 02/22/2011, 10:54 AM

## 2011-02-22 NOTE — Progress Notes (Signed)
Subjective:  Pt feels better. Still with shortness of breath just walking to the bathroom. No further chest tightness. No other complaints.  Objective:  Vital Signs in the last 24 hours: Temp:  [97.5 F (36.4 C)-97.7 F (36.5 C)] 97.5 F (36.4 C) (11/22 0400) Pulse Rate:  [67-157] 78  (11/22 0500) Resp:  [6-24] 19  (11/22 0500) BP: (93-155)/(41-97) 110/47 mmHg (11/22 0500) SpO2:  [90 %-97 %] 95 % (11/22 0500) Weight:  [97.1 kg (214 lb 1.1 oz)-98.431 kg (217 lb)] 214 lb 1.1 oz (97.1 kg) (11/22 0500)  Intake/Output from previous day: 11/21 0701 - 11/22 0700 In: 648.2 [P.O.:350; I.V.:298.2] Out: 300 [Urine:300]  Physical Exam: Pt is alert and oriented, elderly, pleasant male in NAD HEENT: normal Neck: JVP - elevated to 8 cm, carotids 2+= with bilateral bruits Lungs: Bibasilar rales CV: RRR with grade 2/6 late peaking crescendo-decrescendo murmur RUSB Abd: soft, NT, Positive BS, no hepatomegaly Ext: no C/C/E, distal pulses intact and equal Skin: warm/dry no rash   Lab Results:  Basename 02/22/11 0600 02/21/11 1558 02/21/11 1557  WBC 9.3 -- 9.6  HGB 14.4 16.3 --  PLT 212 -- 243    Basename 02/21/11 1558  NA 140  K 4.5  CL 104  CO2 --  GLUCOSE 118*  BUN 12  CREATININE 0.90    Basename 02/22/11 0146 02/21/11 2012  TROPONINI 2.28* 2.06*    Cardiac Studies: Echo Pending  Tele: Normal sinus with PAC's  Assessment/Plan:  1. Atrial flutter with RVR, spontaneously converted to sinus rhythm. Cardizem gtt stopped last night because of hypotension.  Will increase metoprolol to 25 mg twice daily. Plan start anticoagulation after cardiac cath tomorrow. Will probably use Xarelto for anticoagulation. 2. NSTEMI - troponin has risen to greater than 2. Suspect demand ischemia in setting of known moderate 3 vessel CAD. Favor cardiac cath for full assessment of coronary anatomy and aortic stenosis. Discussed risks, indications, and alternatives to right and left heart  catheterization with possible PCI. Pt understands and agrees to proceed. Continue heparin. 3. Acute diastolic heart failure. Suspect secondary to #1. Give one dose of IV lasix this am. Otherwise continue current therapy. 4. Dispo - likely home Saturday depending on cath results tomorrow.   Ludmilla Mcgillis, M.D. 02/22/2011, 7:37 AM     

## 2011-02-22 NOTE — Progress Notes (Signed)
ANTICOAGULATION CONSULT NOTE - Follow-up Consult  Pharmacy Consult for Heparin Indication: Chest pain/aflutter   No Known Allergies  Patient Measurements: Height: 5\' 7"  (170.2 cm) Weight: 214 lb 1.1 oz (97.1 kg) IBW/kg (Calculated) : 66.1  Heparin dosing weight: 87.4kg  Vital Signs: Temp: 97.5 F (36.4 C) (11/22 0400) Temp src: Oral (11/22 0400) BP: 110/47 mmHg (11/22 0500) Pulse Rate: 78  (11/22 0500)  Labs:  Basename 02/22/11 0600 02/22/11 0530 02/22/11 0146 02/21/11 2012 02/21/11 1749 02/21/11 1558 02/21/11 1557  HGB 14.4 -- -- -- -- 16.3 --  HCT 42.7 -- -- -- -- 48.0 45.3  PLT 212 -- -- -- -- -- 243  APTT -- -- -- -- -- -- --  LABPROT -- -- -- -- -- -- --  INR -- -- -- -- -- -- --  HEPARINUNFRC -- <0.10* -- -- -- -- --  CREATININE -- -- -- -- -- 0.90 --  CKTOTAL -- -- 152 172 163 -- --  CKMB -- -- 11.1* 10.9* 10.3* -- --  TROPONINI -- -- 2.28* 2.06* 1.73* -- --   Estimated Creatinine Clearance: 78.7 ml/min (by C-G formula based on Cr of 0.9).  Medical History: Past Medical History  Diagnosis Date  . GERD with stricture     w/hx of stricture  . PVD (peripheral vascular disease) 12/07    per cath 12/07....iliac  . Carotid artery occlusion     last u/s 3-11...Marland Kitchenper vasc.surgery 2007 cath: nonobstructive CAD. mil;d MS, trivial AoS, small AAA,  . Bowen's disease     right arm BX: referred to dermatology  . Hyperlipidemia   . Emphysema     PFT 4/10 FVC 3.45 (90%), FEV1 2.72 (106%), TLC 5.36 (93%), DLCO 74%, NO BD RESPONSE: ? OF PSEUDONORMALIZATION OF PFT FINDINGS W/BOTH OBSTRUCTIVE AND RESTRICTIVE DEFECTS.  Marland Kitchen AAA (abdominal aortic aneurysm) 05/2008    PER CARDIAC CATH  . Aortic stenosis 05/2008    PER CARDIAC CATH, MILD, MITRAL STENOSIS  . Mitral stenosis 05/2008    PER CARDIAC CATH  . Coronary artery disease 05/2008    MILD, MEDICAL MANAGEMENT  . Pulmonary hypertension   . COPD (chronic obstructive pulmonary disease)   . Hypoxemia   . Hyperlipidemia   .  Hypertension 02/21/11    pt denies this hx  . Heart murmur   . GERD (gastroesophageal reflux disease)   . Dyspnea      w/u included a cath and PFT's, sxms thought to be pulmonary  . Chronic back pain   . Peripheral neuropathy     right thigh; "it's been that way for years"   Assessment: Heparin level undetectable at first check. No issues with line per RN. No bleeding noted. Noted plans for possible TEE and/or cath on Friday  Goal of Therapy:  Heparin level 0.3-0.7 units/ml   Plan:  1) Heparin bolus 2500 units x 1 2) Increase heparin drip at 1450 units/hr 3) 8 hour heparin level   Lavonia Dana 02/22/2011,7:06 AM

## 2011-02-23 ENCOUNTER — Encounter (HOSPITAL_COMMUNITY)
Admission: EM | Disposition: A | Discharge status: Home / Self Care | Payer: Self-pay | Source: Ambulatory Visit | Attending: Cardiology

## 2011-02-23 HISTORY — PX: LEFT AND RIGHT HEART CATHETERIZATION WITH CORONARY ANGIOGRAM: SHX5449

## 2011-02-23 LAB — POCT I-STAT 3, ART BLOOD GAS (G3+)
Acid-base deficit: 2 mmol/L (ref 0.0–2.0)
Bicarbonate: 26.8 mEq/L — ABNORMAL HIGH (ref 20.0–24.0)
O2 Saturation: 66 %
O2 Saturation: 94 %
TCO2: 28 mmol/L (ref 0–100)
pO2, Arterial: 37 mmHg — CL (ref 80.0–100.0)

## 2011-02-23 LAB — HEPARIN LEVEL (UNFRACTIONATED): Heparin Unfractionated: 0.46 IU/mL (ref 0.30–0.70)

## 2011-02-23 LAB — URINE CULTURE: Colony Count: 15000

## 2011-02-23 LAB — BASIC METABOLIC PANEL
CO2: 29 mEq/L (ref 19–32)
Calcium: 9.2 mg/dL (ref 8.4–10.5)
Creatinine, Ser: 0.94 mg/dL (ref 0.50–1.35)
Glucose, Bld: 107 mg/dL — ABNORMAL HIGH (ref 70–99)

## 2011-02-23 LAB — CBC
Hemoglobin: 14.5 g/dL (ref 13.0–17.0)
MCH: 29.6 pg (ref 26.0–34.0)
MCV: 87.3 fL (ref 78.0–100.0)
RBC: 4.9 MIL/uL (ref 4.22–5.81)

## 2011-02-23 LAB — POCT ACTIVATED CLOTTING TIME: Activated Clotting Time: 111 seconds

## 2011-02-23 SURGERY — LEFT AND RIGHT HEART CATHETERIZATION WITH CORONARY ANGIOGRAM
Anesthesia: Moderate Sedation

## 2011-02-23 MED ORDER — MIDAZOLAM HCL 2 MG/2ML IJ SOLN
INTRAMUSCULAR | Status: AC
  Filled 2011-02-23: qty 2

## 2011-02-23 MED ORDER — ONDANSETRON HCL 4 MG/2ML IJ SOLN: 4.0000 mg | Freq: Four times a day (QID) | INTRAMUSCULAR | Status: DC | PRN

## 2011-02-23 MED ORDER — ACETAMINOPHEN 325 MG PO TABS: 650.0000 mg | ORAL_TABLET | ORAL | Status: DC | PRN

## 2011-02-23 MED ORDER — LIDOCAINE HCL (PF) 1 % IJ SOLN
INTRAMUSCULAR | Status: AC
  Filled 2011-02-23: qty 30

## 2011-02-23 MED ORDER — HEPARIN BOLUS VIA INFUSION
3000.0000 [IU] | Freq: Once | INTRAVENOUS | Status: AC
  Administered 2011-02-23: 3000 [IU] via INTRAVENOUS
  Filled 2011-02-23: qty 3000

## 2011-02-23 MED ORDER — HYDROCORTISONE 1 % EX CREA
TOPICAL_CREAM | Freq: Three times a day (TID) | CUTANEOUS | Status: DC | PRN
  Administered 2011-02-24: 1 via TOPICAL
  Filled 2011-02-23: qty 28

## 2011-02-23 MED ORDER — HEPARIN (PORCINE) IN NACL 2-0.9 UNIT/ML-% IJ SOLN
INTRAMUSCULAR | Status: AC
  Filled 2011-02-23: qty 2000

## 2011-02-23 MED ORDER — FENTANYL CITRATE 0.05 MG/ML IJ SOLN
INTRAMUSCULAR | Status: AC
  Filled 2011-02-23: qty 2

## 2011-02-23 MED ORDER — SODIUM CHLORIDE 0.9 % IV SOLN
INTRAVENOUS | Status: DC
  Administered 2011-02-23: 18:00:00 via INTRAVENOUS

## 2011-02-23 MED ORDER — OXYCODONE-ACETAMINOPHEN 5-325 MG PO TABS
1.0000 | ORAL_TABLET | ORAL | Status: DC | PRN
  Administered 2011-02-24: 2 via ORAL
  Filled 2011-02-23: qty 2

## 2011-02-23 MED ORDER — NITROGLYCERIN 0.2 MG/ML ON CALL CATH LAB
INTRAVENOUS | Status: AC
  Filled 2011-02-23: qty 1

## 2011-02-23 MED ORDER — DABIGATRAN ETEXILATE MESYLATE 150 MG PO CAPS
150.0000 mg | ORAL_CAPSULE | Freq: Two times a day (BID) | ORAL | Status: DC
  Administered 2011-02-24: 150 mg via ORAL
  Filled 2011-02-23 (×3): qty 1

## 2011-02-23 NOTE — Progress Notes (Signed)
1605 Nursing note:  Pt transferred to cath lab on monitor; pt received Percocet 2 tabs for his chronic back pain at 1508 and Valium 5mg  PO on Call to Cath Lab; pt states pain has almost disappeared; verbalizes readiness to have catherization complete  Lavone Orn

## 2011-02-23 NOTE — Procedures (Signed)
Cardiac Catheterization Procedure Note  Name: Fernando Moyer MRN: [id number] DOB: 03-31-1934  Procedure: Right Heart Cath, Left Heart Cath, Selective Coronary Angiography, LV angiography  Indication:  Aortic stenosis, non-ST elevation infarction in the setting of atrial fib with RVR   Procedural Details: The left groin was prepped, draped, and anesthetized with 1% lidocaine. Using the modified Seldinger technique a 6 French sheath was placed in the right femoral artery and a 7 French sheath was placed in the right femoral vein. A multipurpose catheter was used for the right heart catheterization. Standard protocol was followed for recording of right heart pressures and sampling of oxygen saturations. Fick cardiac output was calculated. Standard Judkins catheters were used for selective coronary angiography and left ventriculography. The aortic valve was crossed with an AL-1 catheter directing a straight tip guidewire. This was changed out for a Langston catheter for simultaneous left ventricular and aortic pressure recording. There were no immediate procedural complications. The patient was transferred to the post catheterization recovery area for further monitoring.  Procedural Findings: Hemodynamics RA 8 RV 41/12 PA 47/12 with a mean of 23 PCWP 17 LV 145/12 AO 117/50 with a mean of 78  Oxygen saturations: PA 66% AO 94%  Cardiac Output (Fick) 5.0 L per minute  Cardiac Index (Fick) 2.4 L per minute per meter square  Aortic valve mean gradient 28 mm mercury, aortic valve area 0.9 cm   Coronary angiography: Coronary dominance: right  Left mainstem: patent without significant stenosis  Left anterior descending (LAD): the 50% mid stenosis after the first diagonal branch, there is no high grade obstructive disease present.  Left circumflex (LCx): left circumflex is patent without significant stenosis. There are 2 obtuse marginal branches without significant stenosis.  Right coronary  artery (RCA): there are scattered 30-40% stenoses in the proximal, mid, and distal right coronary artery. The vessel is dominant with a PDA branch without significant stenosis.  Left ventriculography: Left ventricular systolic function is normal, LVEF is estimated at 55-65%, there is no significant mitral regurgitation. There is heavy mitral annular calcification, there is heavy aortic valve calcification.  Final Conclusions:   1. Mild diffuse nonobstructive coronary artery disease as above 2. Severe aortic stenosis with an aortic valve area of 0.9 cm 3. Normal left ventricular systolic function  Recommendations:  The patient appears to have had a small non-ST elevation infarction related to demand ischemia from rapid atrial fibrillation. This is in the setting of moderately severe aortic stenosis. Recommend initiation of anticoagulation. Will use Pradaxa 150 mg twice daily. I have reviewed the risks, potential benefit, and alternatives to anticoagulation. I have also discussed the positives and negatives of the direct thrombin inhibitor versus warfarin and the patient prefers Pradaxa.  His aortic stenosis has progressed and we will have a discussion about the consideration of aortic valve surgery. He will likely be discharged in the morning.   Tonny Bollman 02/23/2011, 5:20 PM

## 2011-02-23 NOTE — Progress Notes (Signed)
ANTICOAGULATION CONSULT NOTE - Follow Up Consult  Pharmacy Consult for heparin Indication: chest pain/ACS/Aflutter  No Known Allergies  Patient Measurements: Height: 5\' 7"  (170.2 cm) Weight: 210 lb 12.2 oz (95.6 kg) IBW/kg (Calculated) : 66.1   Vital Signs: Temp: 97.7 F (36.5 C) (11/23 0000) Temp src: Oral (11/23 0345) BP: 126/57 mmHg (11/23 0345) Pulse Rate: 90  (11/23 0345)  Labs:  Basename 02/23/11 0220 02/22/11 1548 02/22/11 1444 02/22/11 0940 02/22/11 0600 02/22/11 0530 02/22/11 0146 02/21/11 1558 02/21/11 1557  HGB -- -- -- -- 14.4 -- -- 16.3 --  HCT -- -- -- -- 42.7 -- -- 48.0 45.3  PLT -- -- -- -- 212 -- -- -- 243  APTT -- -- -- -- -- -- -- -- --  LABPROT -- -- -- -- -- -- -- -- --  INR -- -- -- -- -- -- -- -- --  HEPARINUNFRC 0.13* 0.23* -- -- -- <0.10* -- -- --  CREATININE -- -- -- -- 0.82 -- -- 0.90 --  CKTOTAL -- -- 160 140 -- -- 152 -- --  CKMB -- -- 8.2* 9.3* -- -- 11.1* -- --  TROPONINI -- -- 1.24* 1.30* -- -- 2.28* -- --   Estimated Creatinine Clearance: 85.8 ml/min (by C-G formula based on Cr of 0.82).   Medications:  Scheduled:    . aspirin  324 mg Oral Pre-Cath  . aspirin EC  81 mg Oral Daily  . aspirin  300 mg Rectal NOW  . clindamycin   Topical BID  . diazepam  5 mg Oral On Call  . furosemide  40 mg Intravenous Once  . heparin  2,500 Units Intravenous Once  . heparin  3,000 Units Intravenous Once  . metoprolol tartrate  25 mg Oral BID  . pantoprazole  40 mg Oral Q1200  . simvastatin  20 mg Oral q1800  . sodium chloride  3 mL Intravenous Q12H  . sodium chloride  3 mL Intravenous Q12H  . DISCONTD: metoprolol tartrate  12.5 mg Oral BID   Infusions:    . sodium chloride 10 mL/hr at 02/22/11 2034  . sodium chloride 250 mL (02/22/11 2159)  . sodium chloride 50 mL/hr at 02/23/11 0339  . diltiazem (CARDIZEM) infusion Stopped (02/22/11 0115)  . heparin 1,900 Units/hr (02/23/11 0426)  . DISCONTD: heparin 1,450 Units/hr (02/22/11 1237)     Assessment: 77yo male remains subtherapeutic on heparin with lower level despite increased rate.  Goal of Therapy:  Heparin level 0.3-0.7 units/ml   Plan:  Will give bolus of 3000 units and increase gtt by 4 units/kg/hr to 1900 units/hr.  Check level in 6hr.  Colleen Can PharmD BCPS 02/23/2011,4:27 AM

## 2011-02-23 NOTE — Progress Notes (Signed)
UR Completed.  Ashritha Desrosiers Jane [phone] 02/23/2011  

## 2011-02-23 NOTE — Progress Notes (Signed)
ANTICOAGULATION CONSULT NOTE - Follow Up Consult  Pharmacy Consult for  Heparin Indication: chest pain/ACS  No Known Allergies  Patient Measurements: Height: 5\' 7"  (170.2 cm) Weight: 210 lb 12.2 oz (95.6 kg) IBW/kg (Calculated) : 66.1  Adjusted Body Weight: 66 kg  Labs:  Basename 02/23/11 1005 02/23/11 0600 02/23/11 0220 02/22/11 1548 02/22/11 1444 02/22/11 0940 02/22/11 0600 02/22/11 0146 02/21/11 1558 02/21/11 1557  HGB -- 14.5 -- -- -- -- 14.4 -- -- --  HCT -- 42.8 -- -- -- -- 42.7 -- 48.0 --  PLT -- 206 -- -- -- -- 212 -- -- 243  APTT -- -- -- -- -- -- -- -- -- --  LABPROT -- -- 12.8 -- -- -- -- -- -- --  INR -- -- 0.94 -- -- -- -- -- -- --  HEPARINUNFRC 0.46 -- 0.13* 0.23* -- -- -- -- -- --  CREATININE -- 0.94 -- -- -- -- 0.82 -- 0.90 --  CKTOTAL -- -- -- -- 160 140 -- 152 -- --  CKMB -- -- -- -- 8.2* 9.3* -- 11.1* -- --  TROPONINI -- -- -- -- 1.24* 1.30* -- 2.28* -- --   Estimated Creatinine Clearance: 74.8 ml/min (by C-G formula based on Cr of 0.94).   Assessment:    Heparin level is 0.46 on 1900 units/hr.  Therapeutic.    No changes needed.  For cardiac cath this afternoon.  Goal of Therapy:  Heparin level 0.3-0.7 units/ml   Plan:     Continue heparin at 1900 units/hr.  To stop pre-cath.    Will follow-up post-cath.   Dennie Fetters 02/23/2011,12:26 PM

## 2011-02-23 NOTE — Interval H&P Note (Signed)
History and Physical Interval Note:   02/23/2011   4:19 PM   Fernando Moyer  has presented today for surgery, with the diagnosis of NSTEMI, Aortic stenosis  The various methods of treatment have been discussed with the patient and family. After consideration of risks, benefits and other options for treatment, the patient has consented to  Procedure(s): LEFT AND RIGHT HEART CATHETERIZATION WITH CORONARY ANGIOGRAM as a surgical intervention .  The patients' history has been reviewed, patient examined, no change in status, stable for surgery.  I have reviewed the patients' chart and labs.  Questions were answered to the patient's satisfaction.     Tonny Bollman  MD

## 2011-02-23 NOTE — H&P (View-Only) (Signed)
Subjective:  Pt feels better. Still with shortness of breath just walking to the bathroom. No further chest tightness. No other complaints.  Objective:  Vital Signs in the last 24 hours: Temp:  [97.5 F (36.4 C)-97.7 F (36.5 C)] 97.5 F (36.4 C) (11/22 0400) Pulse Rate:  [67-157] 78  (11/22 0500) Resp:  [6-24] 19  (11/22 0500) BP: (93-155)/(41-97) 110/47 mmHg (11/22 0500) SpO2:  [90 %-97 %] 95 % (11/22 0500) Weight:  [97.1 kg (214 lb 1.1 oz)-98.431 kg (217 lb)] 214 lb 1.1 oz (97.1 kg) (11/22 0500)  Intake/Output from previous day: 11/21 0701 - 11/22 0700 In: 648.2 [P.O.:350; I.V.:298.2] Out: 300 [Urine:300]  Physical Exam: Pt is alert and oriented, elderly, pleasant male in NAD HEENT: normal Neck: JVP - elevated to 8 cm, carotids 2+= with bilateral bruits Lungs: Bibasilar rales CV: RRR with grade 2/6 late peaking crescendo-decrescendo murmur RUSB Abd: soft, NT, Positive BS, no hepatomegaly Ext: no C/C/E, distal pulses intact and equal Skin: warm/dry no rash   Lab Results:  Basename 02/22/11 0600 02/21/11 1558 02/21/11 1557  WBC 9.3 -- 9.6  HGB 14.4 16.3 --  PLT 212 -- 243    Basename 02/21/11 1558  NA 140  K 4.5  CL 104  CO2 --  GLUCOSE 118*  BUN 12  CREATININE 0.90    Basename 02/22/11 0146 02/21/11 2012  TROPONINI 2.28* 2.06*    Cardiac Studies: Echo Pending  Tele: Normal sinus with PAC's  Assessment/Plan:  1. Atrial flutter with RVR, spontaneously converted to sinus rhythm. Cardizem gtt stopped last night because of hypotension.  Will increase metoprolol to 25 mg twice daily. Plan start anticoagulation after cardiac cath tomorrow. Will probably use Xarelto for anticoagulation. 2. NSTEMI - troponin has risen to greater than 2. Suspect demand ischemia in setting of known moderate 3 vessel CAD. Favor cardiac cath for full assessment of coronary anatomy and aortic stenosis. Discussed risks, indications, and alternatives to right and left heart  catheterization with possible PCI. Pt understands and agrees to proceed. Continue heparin. 3. Acute diastolic heart failure. Suspect secondary to #1. Give one dose of IV lasix this am. Otherwise continue current therapy. 4. Dispo - likely home Saturday depending on cath results tomorrow.   Tonny Bollman, M.D. 02/22/2011, 7:37 AM

## 2011-02-24 ENCOUNTER — Inpatient Hospital Stay (HOSPITAL_COMMUNITY): Payer: Medicare Other

## 2011-02-24 ENCOUNTER — Other Ambulatory Visit: Payer: Self-pay

## 2011-02-24 LAB — CBC
HCT: 41.5 % (ref 39.0–52.0)
MCV: 87.4 fL (ref 78.0–100.0)
RBC: 4.75 MIL/uL (ref 4.22–5.81)
WBC: 10.2 10*3/uL (ref 4.0–10.5)

## 2011-02-24 LAB — COMPREHENSIVE METABOLIC PANEL
ALT: 7 U/L (ref 0–53)
AST: 21 U/L (ref 0–37)
Albumin: 3.3 g/dL — ABNORMAL LOW (ref 3.5–5.2)
CO2: 25 mEq/L (ref 19–32)
Calcium: 8.9 mg/dL (ref 8.4–10.5)
Creatinine, Ser: 1 mg/dL (ref 0.50–1.35)
GFR calc non Af Amer: 71 mL/min — ABNORMAL LOW (ref >90)
Sodium: 139 mEq/L (ref 135–145)
Total Protein: 6.4 g/dL (ref 6.0–8.3)

## 2011-02-24 MED ORDER — DOPAMINE-DEXTROSE 3.2-5 MG/ML-% IV SOLN
INTRAVENOUS | Status: AC
  Administered 2011-02-24: 2 ug
  Filled 2011-02-24: qty 250

## 2011-02-24 MED ORDER — MORPHINE SULFATE 2 MG/ML IJ SOLN
INTRAMUSCULAR | Status: AC
  Filled 2011-02-24: qty 1

## 2011-02-24 MED ORDER — SODIUM CHLORIDE 0.9 % IV BOLUS (SEPSIS): 500.0000 mL | Freq: Once | INTRAVENOUS | Status: DC

## 2011-02-24 MED ORDER — ALUM & MAG HYDROXIDE-SIMETH 200-200-20 MG/5ML PO SUSP
15.0000 mL | Freq: Once | ORAL | Status: AC
  Administered 2011-02-24: 15 mL via ORAL
  Filled 2011-02-24: qty 30

## 2011-02-24 MED ORDER — MORPHINE SULFATE 2 MG/ML IJ SOLN
2.0000 mg | INTRAMUSCULAR | Status: DC | PRN
  Administered 2011-02-24: 2 mg via INTRAVENOUS

## 2011-02-24 MED ORDER — DABIGATRAN ETEXILATE MESYLATE 150 MG PO CAPS: 150.0000 mg | ORAL_CAPSULE | Freq: Two times a day (BID) | ORAL | Status: DC

## 2011-02-24 MED ORDER — METOPROLOL TARTRATE 25 MG PO TABS: 25.0000 mg | ORAL_TABLET | Freq: Two times a day (BID) | ORAL | Status: DC

## 2011-02-24 NOTE — Discharge Summary (Signed)
Physician Discharge Summary  Patient ID: Fernando Moyer,  MRN: [id number], DOB/AGE: 1934-02-09 77 y.o.  Admit date: 02/21/2011 Discharge date: 02/24/2011  Primary Discharge Diagnosis:  NSTEMI   Secondary Discharge Diagnosis:  1. Atrial fibrillation with RVR 2. Severe aortic stenosis 3. PAD with chronic right iliac occlusion 4. Carotid stenosis - followed by Dr Edilia Bo 5. Nonobstructive CAD 6. Hyperlipidemia 7. AAA 8. COPD  Hospital Course: 77 year-old gentleman well known to me who presented 11/21 with chest pain and was noted to be in atrial fib with RVR. He converted spontaneously on a diltiazem drip and this was stopped within 24 hours of admission because of hypotension. The patient was started on a low-dose beta-blocker and had no recurrence of atrial fib. He had marked chest tightness associated with his atrial fib, and he ruled in for a NSTEMI with troponin elevation greater than 2. Symptoms resolved after he went back into sinus rhythm.  The patient has known moderate aortic stenosis from an echo in April 2012 with mean gradient 35 on that echo. He underwent a repeat echo during this hospital stay showing an increase in the gradient to 39 mmHg, now just at the border of moderate/severe AS.  He ultimately underwent left and right cardiac catheterization 02/23/2011 showing moderately severe AS with a calculated AVA of 0.9 square cm. He had nonobstructive CAD but no significant lesions were noted.  He tolerated the procedure well and had no complications noted. The night prior to discharge, he developed abdominal discomfort following administration of percocet for back pain. He was given NTG then became hypotensive. He was treated transiently with dopamine and fluids, but soon returned to baseline.   On the morning of discharge, he remained in NSR, had no complaints, and was eager to go home.  Discharge Vitals: Blood pressure 127/55, pulse 76, temperature 97.2 F (36.2 C),  temperature source Oral, resp. rate 18, height 5\' 7"  (1.702 m), weight 95.6 kg (210 lb 12.2 oz), SpO2 96.00%.  Pt is alert and oriented, obese male in NAD HEENT: normal Neck: JVP - normal, carotids 2+= with bilateral bruits Lungs: CTA bilaterally CV: RRR with a grade 3/6 harsh systolic murmur at the LSB Abd: soft, NT, Positive BS, obese Ext: no C/C/E, distal pulses intact and equal. Right groin is clear. Skin: warm/dry no rash  Labs: Lab Results  Component Value Date   WBC 10.2 02/24/2011   HGB 13.8 02/24/2011   HCT 41.5 02/24/2011   MCV 87.4 02/24/2011   PLT 233 02/24/2011    Lab 02/24/11 0331  NA 139  K 4.6  CL 104  CO2 25  BUN 12  CREATININE 1.00  CALCIUM 8.9  PROT 6.4  BILITOT 1.4*  ALKPHOS 71  ALT 7  AST 21  GLUCOSE 141*    Basename 02/22/11 1444 02/22/11 0940 02/22/11 0146 02/21/11 2012  CKTOTAL 160 140 152 172  CKMB 8.2* 9.3* 11.1* 10.9*  TROPONINI 1.24* 1.30* 2.28* 2.06*   Lab Results  Component Value Date   CHOL 148 02/22/2011   HDL 29* 02/22/2011   LDLCALC 85 02/22/2011   TRIG 171* 02/22/2011   Lab Results  Component Value Date   DDIMER  Value: 0.42        AT THE INHOUSE ESTABLISHED CUTOFF VALUE OF 0.48 ug/mL FEU, THIS ASSAY HAS BEEN DOCUMENTED IN THE LITERATURE TO HAVE A SENSITIVITY AND NEGATIVE PREDICTIVE VALUE OF AT LEAST 98 TO 99%.  THE TEST RESULT SHOULD BE CORRELATED WITH AN ASSESSMENT OF THE CLINICAL  PROBABILITY OF DVT / VTE. 06/06/2009    Diagnostic Studies/Procedures:  Dg Chest Port 1 View  02/24/2011  *RADIOLOGY REPORT*  Clinical Data: Sudden onset of right upper quadrant abdominal pain. Assess for free intra-abdominal air.  PORTABLE CHEST - 1 VIEW  Comparison: Chest radiograph performed 02/21/2011  Findings: The lungs are well-aerated.  Bilateral central and bibasilar airspace opacities are noted, with likely small bilateral pleural effusions, raising suspicion for worsening pulmonary edema. Underlying pneumonia cannot be entirely excluded.   No pneumothorax is seen.  The cardiomediastinal silhouette is mildly enlarged; calcification is noted within the aortic arch.  No acute osseous abnormalities are seen.  No free intra-abdominal air is identified.  IMPRESSION:  1.  Bilateral central and bibasilar airspace opacities, with likely small bilateral pleural effusions and mild cardiomegaly, raising suspicion for worsening pulmonary edema.  Underlying pneumonia cannot be entirely excluded. 2.  No free intra-abdominal air seen.  Original Report Authenticated By: Tonia Ghent, M.D.   Dg Chest Port 1 View  02/21/2011  *RADIOLOGY REPORT*  Clinical Data: Left-sided chest pains.  PORTABLE CHEST - 1 VIEW  Comparison: 08/03/2010  Findings: Portable view of the chest demonstrates prominent basilar interstitial densities.  Findings are concerning for basilar edema or atelectasis.  Heart size is within normal limits and the trachea is midline.  Upper lungs are clear.  IMPRESSION: Basilar interstitial densities. Findings are suspicious for basilar edema and/or atelectasis.  Original Report Authenticated By: Richarda Overlie, M.D.   Dg Abd Portable 1v  02/24/2011  *RADIOLOGY REPORT*  Clinical Data: Sudden onset of severe right upper quadrant abdominal pain.  Assess for dilated loops of bowel.  ABDOMEN - 1 VIEW  Comparison: Abdominal radiograph and CT of the abdomen and pelvis performed 02/27/2009  Findings: The visualized bowel gas pattern is unremarkable. Scattered air and stool filled loops of colon are seen; no abnormal dilatation of small bowel loops is seen to suggest small bowel obstruction.  No free intra-abdominal air is identified, though evaluation for free air is limited on a single supine view.  The visualized osseous structures are within normal limits; the sacroiliac joints are unremarkable in appearance.  Bibasilar opacities raise suspicion for pulmonary edema on the concurrent chest radiograph.  IMPRESSION:  1.  Unremarkable bowel gas pattern; no free  intra-abdominal air seen. 2.  Bibasilar airspace opacities raise suspicion for pulmonary edema on the concurrent chest radiograph.  Original Report Authenticated By: Tonia Ghent, M.D.    Discharge Medications:  Current Discharge Medication List    CONTINUE these medications which have NOT CHANGED   Details  omeprazole (PRILOSEC) 20 MG capsule Take 20 mg by mouth daily.      pravastatin (PRAVACHOL) 40 MG tablet Take 1 tablet (40 mg total) by mouth every evening. Qty: 30 tablet, Refills: 11    clindamycin (CLINDAGEL) 1 % gel Apply topically 2 (two) times daily. Qty: 30 g, Refills: 6      STOP taking these medications     aspirin EC 81 MG EC tablet      oxyCODONE-acetaminophen (PERCOCET) 5-325 MG per tablet      nitroGLYCERIN (NITROSTAT) 0.4 MG SL tablet         Disposition:  The patient will be discharged in stable condition to home. Discharge Orders    Future Appointments: Provider: Department: Dept Phone: Center:   02/28/2011 1:00 PM [facility] [phone] [facility]   02/28/2011 2:00 PM Chuck Hint, MD [facility] [phone] [facility]       Duration  of Discharge Encounter: Greater than 30 minutes  Signed, Tonny Bollman  02/24/2011, 11:25 AM

## 2011-02-24 NOTE — Plan of Care (Signed)
Problem: Phase II Progression Outcomes Goal: Progress activity as tolerated unless otherwise ordered Outcome: Completed/Met Date Met:  02/24/11 Pt ambulated 650 ft on unit without difficulty  Problem: Phase III Progression Outcomes Goal: Anticoagulation Therapy per MD order Outcome: Completed/Met Date Met:  02/23/11 Pt started on Pradaxa and will go home with prescription Goal: Pain controlled on oral analgesia Outcome: Completed/Met Date Met:  02/24/11 Pt has chronic back pain and will continue his current medication

## 2011-02-24 NOTE — Progress Notes (Signed)
I was called to asses Mr. Gaffey for right upper quadrant pain.   Earlier today he had a left heart catheterization showing nonobstructive CAD and severe aortic stenosis with a calculated aortic valve area of 0.9cm2. His BP had been running the 90-120/60-70s range per the EMR.   Approximately 1 hour prago he received 2 Percocet pills from nursing staff for back pain. Subsequent to this, he developed RUQ and abdominal discomfort. Nursing administered 2 SL NTG doses prior to contacting me. When I arrived at the bedside, he was alert, oriented, but feeling flushed and warm. Non-invasive BP was 68/39, HR in the 70s. He was warm peripherally. Abdomen soft, non-distended, with tenderness in the RUQ and R flank. No rebound tenderness. No peritoneal signs.   I suspect he experienced GI discomfort from the Percocet, with a subsequent drop in BP due to the SL NTG in the setting of his aortic stenosis and pre-load dependence. His hypotension was addressed with 500cc NS bolus and transient low-dose Dopamine. I have subsequently discontinued his PRN NTG, as I do not think it is likely to have a therapeutic role in the setting of nonobstructive CAD.  I have confirmed this plan with nursing.   Will administer dose of Maalox for GI discomfort. Given focal nature of pain, will also send CMP, lactate, CBC. Check Upright portable chest and a portable KUB.   Given his cath via the left groin, his soft cath site, and the absence of any left sided abdominal or back pain, I do not suspect an RP bleed as the cause of his symptoms. Should his symptoms continue despite above measures, could then consider RUQ ultrasound in the AM. Will make him NPO until morning.   Zacarias Pontes, M.D. Cardiology Fellow On-Call

## 2011-02-24 NOTE — Progress Notes (Signed)
Discharge Note:  Pt given discharge instructions; granddaughter present; all questions answered; pt and family verbalized understanding; prescriptions given; education handouts given for new medications for Pradaxa and Metoprolol; pt going home; taken out via w/c by CNA  Ermalinda Memos, Buford Dresser

## 2011-02-26 LAB — HM DIABETES EYE EXAM: HM Diabetic Eye Exam: NORMAL

## 2011-02-28 ENCOUNTER — Other Ambulatory Visit: Payer: Medicare Other

## 2011-02-28 ENCOUNTER — Ambulatory Visit: Payer: Medicare Other | Admitting: Vascular Surgery

## 2011-03-06 ENCOUNTER — Ambulatory Visit (INDEPENDENT_AMBULATORY_CARE_PROVIDER_SITE_OTHER): Payer: Medicare Other | Admitting: Cardiovascular Disease

## 2011-03-06 DIAGNOSIS — I2510 Atherosclerotic heart disease of native coronary artery without angina pectoris: Secondary | ICD-10-CM

## 2011-03-06 DIAGNOSIS — I4891 Unspecified atrial fibrillation: Secondary | ICD-10-CM

## 2011-03-06 DIAGNOSIS — I359 Nonrheumatic aortic valve disorder, unspecified: Secondary | ICD-10-CM

## 2011-03-06 DIAGNOSIS — I4892 Unspecified atrial flutter: Secondary | ICD-10-CM

## 2011-03-06 NOTE — Assessment & Plan Note (Signed)
The patient is maintaining sinus rhythm. He is tolerating a low dose beta blocker. He is tolerating pradaxa for anticoagulation.

## 2011-03-06 NOTE — Patient Instructions (Signed)
Your physician has recommended that you have a pulmonary function test. Pulmonary Function Tests are a group of tests that measure how well air moves in and out of your lungs.  Your physician recommends that you schedule a follow-up appointment in: 2 months  Your physician recommends that you continue on your current medications as directed. Please refer to the Current Medication list given to you today.

## 2011-03-06 NOTE — Assessment & Plan Note (Signed)
The patient is on the cusp of having severe aortic stenosis. He has significant comorbidities including peripheral arterial disease and chronic obstructive pulmonary disease. I have recommended that we do PFTs and I would like to see him back in followup in a few months. I will probably repeat an echo at a six-month interval to assess for progressive aortic stenosis. If he has aggressive symptoms will move towards aortic valve replacement, either surgical or transcatheter.

## 2011-03-06 NOTE — Progress Notes (Signed)
HPI:  [name] presents for hospital followup evaluation. He is a 77 year old gentleman who has been followed for mixed valvular heart disease and peripheral arterial disease. He also has bilateral carotid stenosis and has been managed medically. The patient was admitted a few weeks ago with atrial fibrillation with RVR. He had chest pain and ruled in for a non-ST elevation infarction in the setting of his rapid ventricular rate. He ultimately underwent cardiac catheterization demonstrating mild to moderate diffuse coronary artery disease without evidence of severe stenoses.  He has been followed for aortic stenosis which has been in the moderate range over the past years. However, his aortic stenosis has worsened and recent hospital evaluation demonstrated a mean gradient of 39 mm mercury by echo and his aortic valve area by catheter was 0.9 cm.  The patient complains of generalized fatigue. He feels like he is having a very slow recovery from his hospital stay. He denies chest pain or pressure. He complains of dyspnea with exertion, unchanged over time. He denies leg swelling, lightheadedness, palpitations, or syncope. He is tolerating pradaxa and denies any worsening of his reflux symptoms.  Outpatient Encounter Prescriptions as of 03/06/2011  Medication Sig Dispense Refill  . clindamycin (CLINDAGEL) 1 % gel Apply topically 2 (two) times daily.  30 g  6  . dabigatran (PRADAXA) 150 MG CAPS Take 1 capsule (150 mg total) by mouth every 12 (twelve) hours.  60 capsule  5  . metoprolol tartrate (LOPRESSOR) 25 MG tablet Take 1 tablet (25 mg total) by mouth 2 (two) times daily.  60 tablet  5  . omeprazole (PRILOSEC) 20 MG capsule Take 20 mg by mouth daily.        . pravastatin (PRAVACHOL) 40 MG tablet Take 1 tablet (40 mg total) by mouth every evening.  30 tablet  11    No Known Allergies  Past Medical History  Diagnosis Date  . GERD with stricture     w/hx of stricture  . PVD (peripheral vascular  disease) 12/07    per cath 12/07....iliac  . Carotid artery occlusion     last u/s 3-11...Marland Kitchenper vasc.surgery 2007 cath: nonobstructive CAD. mil;d MS, trivial AoS, small AAA,  . Bowen's disease     right arm BX: referred to dermatology  . Hyperlipidemia   . Emphysema     PFT 4/10 FVC 3.45 (90%), FEV1 2.72 (106%), TLC 5.36 (93%), DLCO 74%, NO BD RESPONSE: ? OF PSEUDONORMALIZATION OF PFT FINDINGS W/BOTH OBSTRUCTIVE AND RESTRICTIVE DEFECTS.  Marland Kitchen AAA (abdominal aortic aneurysm) 05/2008    PER CARDIAC CATH  . Aortic stenosis 05/2008    PER CARDIAC CATH, MILD, MITRAL STENOSIS  . Mitral stenosis 05/2008    PER CARDIAC CATH  . Coronary artery disease 05/2008    MILD, MEDICAL MANAGEMENT  . Pulmonary hypertension   . COPD (chronic obstructive pulmonary disease)   . Hypoxemia   . Hyperlipidemia   . Hypertension 02/21/11    pt denies this hx  . Heart murmur   . GERD (gastroesophageal reflux disease)   . Dyspnea      w/u included a cath and PFT's, sxms thought to be pulmonary  . Chronic back pain   . Peripheral neuropathy     right thigh; "it's been that way for years"    ROS: Negative except as per HPI  BP 98/60  Pulse 86  Ht 5\' 8"  (1.727 m)  Wt 97.523 kg (215 lb)  BMI 32.69 kg/m2  PHYSICAL EXAM: Pt is alert  and oriented, elderly male in NAD HEENT: normal Neck: JVP - normal, carotids 2+= without bruits Lungs: CTA bilaterally CV: RRR with grade 3/6 harsh systolic murmur at the right upper sternal border Abd: soft, NT, Positive BS, no hepatomegaly Ext: no C/C/E Skin: warm/dry no rash  EKG:  Sinus rhythm with PACs, heart rate 86 beats per minute, right bundle branch block and left anterior fascicular block (bifascicular block).  ASSESSMENT AND PLAN:

## 2011-03-06 NOTE — Assessment & Plan Note (Signed)
Stable nonobstructive disease by recent cardiac catheterization. Continue current medical therapy.

## 2011-03-07 ENCOUNTER — Telehealth: Payer: Self-pay | Admitting: Cardiovascular Disease

## 2011-03-07 NOTE — Telephone Encounter (Signed)
Pt Signed ROI while In office, Records were Mailed to Pt's  Home Address 03/07/11/km

## 2011-03-12 ENCOUNTER — Ambulatory Visit (INDEPENDENT_AMBULATORY_CARE_PROVIDER_SITE_OTHER): Payer: Medicare Other | Admitting: Internal Medicine

## 2011-03-12 DIAGNOSIS — J438 Other emphysema: Secondary | ICD-10-CM

## 2011-03-12 LAB — PULMONARY FUNCTION TEST

## 2011-03-12 NOTE — Progress Notes (Signed)
PFT done today. 

## 2011-03-22 ENCOUNTER — Encounter: Payer: Self-pay | Admitting: Cardiovascular Disease

## 2011-03-23 ENCOUNTER — Telehealth: Payer: Self-pay | Admitting: Cardiovascular Disease

## 2011-03-23 NOTE — Telephone Encounter (Signed)
New message:  Daughter states message was left regarding his lung function.  Please call her back

## 2011-03-23 NOTE — Telephone Encounter (Signed)
Fernando Moyer aware of PFT results by phone.

## 2011-04-04 ENCOUNTER — Other Ambulatory Visit: Payer: Self-pay | Admitting: Internal Medicine

## 2011-04-04 ENCOUNTER — Other Ambulatory Visit: Payer: Self-pay

## 2011-04-04 MED ORDER — PRAVASTATIN SODIUM 40 MG PO TABS: 40.0000 mg | ORAL_TABLET | Freq: Every evening | ORAL | Status: DC

## 2011-04-04 MED ORDER — METOPROLOL TARTRATE 25 MG PO TABS: 25.0000 mg | ORAL_TABLET | Freq: Two times a day (BID) | ORAL | Status: DC

## 2011-04-04 NOTE — Telephone Encounter (Signed)
Rx sent 

## 2011-04-10 ENCOUNTER — Encounter: Payer: Self-pay | Admitting: Vascular Surgery

## 2011-04-11 ENCOUNTER — Encounter: Payer: Self-pay | Admitting: Vascular Surgery

## 2011-04-11 ENCOUNTER — Telehealth: Payer: Self-pay | Admitting: Cardiovascular Disease

## 2011-04-11 ENCOUNTER — Ambulatory Visit (INDEPENDENT_AMBULATORY_CARE_PROVIDER_SITE_OTHER): Payer: Medicare Other | Admitting: Vascular Surgery

## 2011-04-11 ENCOUNTER — Other Ambulatory Visit (INDEPENDENT_AMBULATORY_CARE_PROVIDER_SITE_OTHER): Payer: Medicare Other | Admitting: Vascular Surgery

## 2011-04-11 VITALS — BP 115/69 | HR 70 | Resp 16 | Ht 67.0 in | Wt 220.0 lb

## 2011-04-11 DIAGNOSIS — I6529 Occlusion and stenosis of unspecified carotid artery: Secondary | ICD-10-CM | POA: Diagnosis not present

## 2011-04-11 DIAGNOSIS — Z48812 Encounter for surgical aftercare following surgery on the circulatory system: Secondary | ICD-10-CM

## 2011-04-11 NOTE — Telephone Encounter (Signed)
I spoke with the pharmacist and the pt needs a 90 day supply of Metoprolol tartrate.  Rx changed to #180 and 3 refills.

## 2011-04-11 NOTE — Telephone Encounter (Signed)
New problem [facility] calling about clarification of dosage for metoprolol please call

## 2011-04-11 NOTE — Progress Notes (Signed)
[facility] [place]  Patient name: Fernando Moyer MRN: [id number] DOB: 1933/07/25 Sex: male  REASON FOR VISIT: follow up of carotid disease  HPI: Fernando Moyer is a 78 y.o. male who I been following with moderate bilateral carotid disease. The stenosis on the right progressed to greater than 80% and right carotid endarterectomy was recommended. He went right carotid endarterectomy with Dacron patch angioplasty on 08/01/2010. He comes in for a 6 month follow up visit. Since I saw him last she's had no history of stroke, TIAs, expressive or receptive aphasia, or amaurosis fugax.  He does have a history of aortic stenosis and is followed by Dr. Tonny Bollman. He also has a history of some mild coronary artery disease.  Past Medical History  Diagnosis Date  . GERD with stricture     w/hx of stricture  . PVD (peripheral vascular disease) 12/07    per cath 12/07....iliac  . Carotid artery occlusion     last u/s 3-11...Marland Kitchenper vasc.surgery 2007 cath: nonobstructive CAD. mil;d MS, trivial AoS, small AAA,  . Bowen's disease     right arm BX: referred to dermatology  . Hyperlipidemia   . Emphysema     PFT 4/10 FVC 3.45 (90%), FEV1 2.72 (106%), TLC 5.36 (93%), DLCO 74%, NO BD RESPONSE: ? OF PSEUDONORMALIZATION OF PFT FINDINGS W/BOTH OBSTRUCTIVE AND RESTRICTIVE DEFECTS.  Marland Kitchen AAA (abdominal aortic aneurysm) 05/2008    PER CARDIAC CATH  . Aortic stenosis 05/2008    PER CARDIAC CATH, MILD, MITRAL STENOSIS  . Mitral stenosis 05/2008    PER CARDIAC CATH  . Coronary artery disease 05/2008    MILD, MEDICAL MANAGEMENT  . Pulmonary hypertension   . COPD (chronic obstructive pulmonary disease)   . Hypoxemia   . Hyperlipidemia   . Hypertension 02/21/11    pt denies this hx  . Heart murmur   . GERD (gastroesophageal reflux disease)   . Dyspnea      w/u included a cath and PFT's, sxms thought to be pulmonary  . Chronic back pain   . Peripheral neuropathy     right thigh; "it's  been that way for years"  . Myocardial infarction     Family History  Problem Relation Age of Onset  . Heart attack Father 15    MI  . Diabetes Neg Hx   . Colon cancer Neg Hx   . Prostate cancer Neg Hx     SOCIAL HISTORY: History  Substance Use Topics  . Smoking status: Former Smoker -- 2.0 packs/day for 30 years    Types: Cigarettes    Quit date: 04/02/1986  . Smokeless tobacco: Never Used   Comment: quit smoking cigarettes 04/1987  . Alcohol Use: No    No Known Allergies  Current Outpatient Prescriptions  Medication Sig Dispense Refill  . clindamycin (CLINDAGEL) 1 % gel Apply topically 2 (two) times daily.  30 g  6  . dabigatran (PRADAXA) 150 MG CAPS Take 1 capsule (150 mg total) by mouth every 12 (twelve) hours.  60 capsule  5  . metoprolol tartrate (LOPRESSOR) 25 MG tablet Take 1 tablet (25 mg total) by mouth 2 (two) times daily.  60 tablet  5  . omeprazole (PRILOSEC) 20 MG capsule Take 20 mg by mouth daily.        . pravastatin (PRAVACHOL) 40 MG tablet Take 1 tablet (40 mg total) by mouth every evening.  90 tablet  2    REVIEW OF SYSTEMS: Arly.Keller ]  denotes positive finding; [  ] denotes negative finding CARDIOVASCULAR:  [ ]  chest pain   [ ]  chest pressure   [ ]  palpitations   [ ]  orthopnea   Arly.Keller ] dyspnea on exertion   Arly.Keller ] claudication   [ ]  rest pain   [ ]  DVT   [ ]  phlebitis PULMONARY:   [ ]  productive cough   [ ]  asthma   [ ]  wheezing NEUROLOGIC:   [ ]  weakness  [ ]  paresthesias  [ ]  aphasia  [ ]  amaurosis  [ ]  dizziness HEMATOLOGIC:   [ ]  bleeding problems   [ ]  clotting disorders MUSCULOSKELETAL:  [ ]  joint pain   [ ]  joint swelling [ ]  leg swelling GASTROINTESTINAL: [ ]   blood in stool  [ ]   hematemesis GENITOURINARY:  [ ]   dysuria  [ ]   hematuria PSYCHIATRIC:  [ ]  history of major depression INTEGUMENTARY:  [ ]  rashes  [ ]  ulcers CONSTITUTIONAL:  [ ]  fever   [ ]  chills  PHYSICAL EXAM: Filed Vitals:   04/11/11 1525 04/11/11 1526  BP: 147/64 115/69  Pulse: 69  70  Resp: 16   Height: 5\' 7"  (1.702 m)   Weight: 220 lb (99.791 kg)   SpO2: 95% 95%   Body mass index is 34.46 kg/(m^2). GENERAL: The patient is a well-nourished male, in no acute distress. The vital signs are documented above. CARDIOVASCULAR: There is a regular rate and rhythm without significant murmur appreciated. I do not detect carotid bruits. PULMONARY: There is good air exchange bilaterally without wheezing or rales. ABDOMEN: Soft and non-tender with normal pitched bowel sounds.  MUSCULOSKELETAL: There are no major deformities or cyanosis. NEUROLOGIC: No focal weakness or paresthesias are detected. SKIN: There are no ulcers or rashes noted. PSYCHIATRIC: The patient has a normal affect.  DATA:  Have independently interpreted his carotid duplex scan which shows no evidence of significant carotid stenosis on the right. His carotid endarterectomy site is widely patent. Is some mild intimal hyperplasia versus stenosis at the distal patch. In the left side he has a 40-59% stenosis. I have compared his duplex to the study done in April 2012 in the velocities on the left have increased somewhat.  MEDICAL ISSUES: We would not consider left carotid endarterectomy unless the stenosis progressed to greater than 80%. I have ordered a followed carotid duplex scan for 6 months. I'll plan on seeing him back in one year and last is any significant change in his carotid duplex scan in 6 months. He knows to call sooner if he has problems.  DICKSON,CHRISTOPHER S [facility] [place] Beeper: [phone]

## 2011-04-18 ENCOUNTER — Other Ambulatory Visit: Payer: Self-pay | Admitting: Vascular Surgery

## 2011-04-18 DIAGNOSIS — Z48812 Encounter for surgical aftercare following surgery on the circulatory system: Secondary | ICD-10-CM

## 2011-04-18 DIAGNOSIS — I6529 Occlusion and stenosis of unspecified carotid artery: Secondary | ICD-10-CM

## 2011-04-18 NOTE — Procedures (Unsigned)
CAROTID DUPLEX EXAM  INDICATION:  Carotid stenosis.  HISTORY: Diabetes:  No. Cardiac:  CAD, COPD. Hypertension:  Yes. Smoking:  Previous. Previous Surgery:  Right carotid endarterectomy on 08/01/2010. CV History:  Currently asymptomatic. Amaurosis Fugax No, Paresthesias No, Hemiparesis No.                                      RIGHT             LEFT Brachial systolic pressure:         144               100 Brachial Doppler waveforms:         WNL               Biphasic Vertebral direction of flow:        Antegrade         Retrograde DUPLEX VELOCITIES (cm/sec) CCA peak systolic                   131               135 ECA peak systolic                   202               318 ICA peak systolic                   99                238 ICA end diastolic                   9.5               45 PLAQUE MORPHOLOGY:                  Heterogenous      Calcified PLAQUE AMOUNT:                      Mild              Moderate PLAQUE LOCATION:                    CCA/ICA           CCA/ICA/ECA  IMPRESSION: 1. Right internal carotid artery is patent with a history of     endarterectomy, mild hyperplasia versus stenosis previous at the     distal patch. 2. Bilateral external carotid artery stenosis present. 3. Left internal carotid artery stenosis in the 40% to 59% range. 4. Right vertebral artery is patent and antegrade. 5. Left vertebral artery is retrograde with difference of brachial     pressures, which may suggest subclavian steal syndrome. 6. Unchanged on the left side since previous study on 07/19/2010.  ___________________________________________ Di Kindle. Edilia Bo, M.D.  SH/MEDQ  D:  04/11/2011  T:  04/11/2011  Job:  [id number]

## 2011-04-27 ENCOUNTER — Other Ambulatory Visit: Payer: Self-pay

## 2011-04-27 MED ORDER — DABIGATRAN ETEXILATE MESYLATE 150 MG PO CAPS: 150.0000 mg | ORAL_CAPSULE | Freq: Two times a day (BID) | ORAL | Status: DC

## 2011-05-08 ENCOUNTER — Ambulatory Visit (INDEPENDENT_AMBULATORY_CARE_PROVIDER_SITE_OTHER): Payer: Medicare Other | Admitting: Cardiovascular Disease

## 2011-05-08 ENCOUNTER — Encounter: Payer: Self-pay | Admitting: Cardiovascular Disease

## 2011-05-08 VITALS — BP 108/73 | HR 79 | Ht 67.0 in | Wt 216.8 lb

## 2011-05-08 DIAGNOSIS — I739 Peripheral vascular disease, unspecified: Secondary | ICD-10-CM

## 2011-05-08 DIAGNOSIS — I359 Nonrheumatic aortic valve disorder, unspecified: Secondary | ICD-10-CM | POA: Diagnosis not present

## 2011-05-08 DIAGNOSIS — I6529 Occlusion and stenosis of unspecified carotid artery: Secondary | ICD-10-CM

## 2011-05-08 DIAGNOSIS — I350 Nonrheumatic aortic (valve) stenosis: Secondary | ICD-10-CM

## 2011-05-08 DIAGNOSIS — I4892 Unspecified atrial flutter: Secondary | ICD-10-CM

## 2011-05-08 NOTE — Assessment & Plan Note (Signed)
The patient is maintaining sinus rhythm. We'll continue anticoagulation with pradaxa.

## 2011-05-08 NOTE — Patient Instructions (Addendum)
Your physician has requested that you have an echocardiogram. Echocardiography is a painless test that uses sound waves to create images of your heart. It provides your doctor with information about the size and shape of your heart and how well your heart's chambers and valves are working. This procedure takes approximately one hour. There are no restrictions for this procedure.  Your physician recommends that you continue on your current medications as directed. Please refer to the Current Medication list given to you today.  We will arrange your next follow-up appointment after reviewing ECHO results.

## 2011-05-08 NOTE — Assessment & Plan Note (Signed)
The patient has aortic stenosis and moderate CAD. I have recommended that he have a repeat echocardiogram in May of this year which will give a 6 month interval followup. I think his aortic stenosis is playing a role in his symptoms, but I suspect his dyspnea is multifactorial. I ordered pulmonary function tests after his last evaluation in these have now been completed. He was found to have mild obstructive airway disease with no significant bronchodilator response. His lung volumes were normal. His diffusion capacity was moderately reduced at 62%. I think the best way to proceed is to consider evaluation in the multidisciplinary valve clinic after his next echocardiogram is done, especially if there is progression of his aortic stenosis. I have reviewed this with the patient and he agrees with this plan. We'll review his echo first and then we'll likely schedule a valve clinic appointment.

## 2011-05-08 NOTE — Progress Notes (Signed)
HPI:  [name] presents for followup. He is a 78 year old gentleman followed for coronary artery disease, chronic dyspnea, peripheral arterial disease, aortic stenosis, and paroxysmal atrial fibrillation.  He was seen last March 06, 2011 for hospital followup. He had been hospitalized with atrial fibrillation with RVR and he sustained a non-ST elevation infarction in that setting. Catheterization demonstrated mild to moderate diffuse CAD without evidence of critical coronary stenoses. He was also noted to have progressive aortic stenosis with a mean gradient of 39 mm by echo and an aortic valve area calculation of 0.9 cm by cardiac catheterization. The patient has been treated medically and he is beginning to feel better. He continues to have limitation by exertional dyspnea and right leg claudication. He can walk about 100 yards but is limited to that distance. He has occasional chest pain but this is not related to exertion. He denies orthopnea, PND, or leg edema. He has had no recent palpitations. He is tolerating pradaxa and denies bleeding problems. He has no other complaints and overall he feels that his energy level is improving.  Outpatient Encounter Prescriptions as of 05/08/2011  Medication Sig Dispense Refill  . clindamycin (CLINDAGEL) 1 % gel Apply topically 2 (two) times daily.  30 g  6  . dabigatran (PRADAXA) 150 MG CAPS Take 1 capsule (150 mg total) by mouth every 12 (twelve) hours.  60 capsule  5  . metoprolol tartrate (LOPRESSOR) 25 MG tablet Take 1 tablet (25 mg total) by mouth 2 (two) times daily.  60 tablet  5  . omeprazole (PRILOSEC) 20 MG capsule Take 20 mg by mouth daily.        . pravastatin (PRAVACHOL) 40 MG tablet Take 1 tablet (40 mg total) by mouth every evening.  90 tablet  2    No Known Allergies  Past Medical History  Diagnosis Date  . GERD with stricture     w/hx of stricture  . PVD (peripheral vascular disease) 12/07    per cath 12/07....iliac  . Carotid artery  occlusion     last u/s 3-11...Marland Kitchenper vasc.surgery 2007 cath: nonobstructive CAD. mil;d MS, trivial AoS, small AAA,  . Bowen's disease     right arm BX: referred to dermatology  . Hyperlipidemia   . Emphysema     PFT 4/10 FVC 3.45 (90%), FEV1 2.72 (106%), TLC 5.36 (93%), DLCO 74%, NO BD RESPONSE: ? OF PSEUDONORMALIZATION OF PFT FINDINGS W/BOTH OBSTRUCTIVE AND RESTRICTIVE DEFECTS.  Marland Kitchen AAA (abdominal aortic aneurysm) 05/2008    PER CARDIAC CATH  . Aortic stenosis 05/2008    PER CARDIAC CATH, MILD, MITRAL STENOSIS  . Mitral stenosis 05/2008    PER CARDIAC CATH  . Coronary artery disease 05/2008    MILD, MEDICAL MANAGEMENT  . Pulmonary hypertension   . COPD (chronic obstructive pulmonary disease)   . Hypoxemia   . Hyperlipidemia   . Hypertension 02/21/11    pt denies this hx  . Heart murmur   . GERD (gastroesophageal reflux disease)   . Dyspnea      w/u included a cath and PFT's, sxms thought to be pulmonary  . Chronic back pain   . Peripheral neuropathy     right thigh; "it's been that way for years"  . Myocardial infarction     ROS: Negative except as per HPI  BP 108/73  Pulse 79  Ht 5\' 7"  (1.702 m)  Wt 98.34 kg (216 lb 12.8 oz)  BMI 33.96 kg/m2  PHYSICAL EXAM: Pt is alert and  oriented, overweight male in NAD HEENT: normal Neck: JVP - normal, carotids 2+= with bilateral bruits Lungs: CTA bilaterally CV: RRR with grade 3/6 crescendo/decrescendo murmur at the right upper sternal border Abd: soft, NT, Positive BS, no hepatomegaly Ext: no C/C/E Skin: warm/dry no rash  ASSESSMENT AND PLAN:

## 2011-05-08 NOTE — Assessment & Plan Note (Signed)
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 Continue conservative care. He has no evidence of progressive leg ischemia.

## 2011-05-08 NOTE — Assessment & Plan Note (Signed)
The patient's carotid disease is followed by Dr. Edilia Bo. He has undergone endarterectomy and has moderate stenosis on the contralateral side. He has a followup ultrasound scheduled in July.

## 2011-05-22 ENCOUNTER — Other Ambulatory Visit: Payer: Self-pay | Admitting: *Deleted

## 2011-05-22 ENCOUNTER — Other Ambulatory Visit: Payer: Self-pay | Admitting: Cardiovascular Disease

## 2011-05-22 MED ORDER — METOPROLOL TARTRATE 25 MG PO TABS: 25.0000 mg | ORAL_TABLET | Freq: Two times a day (BID) | ORAL | Status: DC

## 2011-05-22 NOTE — Telephone Encounter (Signed)
New Msg: Pharmacy calling regarding metroprol. Please call pharmacy to discuss further and/or call RX in asap.

## 2011-05-25 ENCOUNTER — Other Ambulatory Visit: Payer: Self-pay | Admitting: *Deleted

## 2011-06-26 ENCOUNTER — Ambulatory Visit (HOSPITAL_COMMUNITY): Payer: Medicare Other | Attending: Cardiology

## 2011-06-26 ENCOUNTER — Other Ambulatory Visit: Payer: Self-pay

## 2011-06-26 DIAGNOSIS — I714 Abdominal aortic aneurysm, without rupture, unspecified: Secondary | ICD-10-CM | POA: Insufficient documentation

## 2011-06-26 DIAGNOSIS — E785 Hyperlipidemia, unspecified: Secondary | ICD-10-CM | POA: Insufficient documentation

## 2011-06-26 DIAGNOSIS — I252 Old myocardial infarction: Secondary | ICD-10-CM | POA: Diagnosis not present

## 2011-06-26 DIAGNOSIS — I2510 Atherosclerotic heart disease of native coronary artery without angina pectoris: Secondary | ICD-10-CM | POA: Insufficient documentation

## 2011-06-26 DIAGNOSIS — I4891 Unspecified atrial fibrillation: Secondary | ICD-10-CM | POA: Insufficient documentation

## 2011-06-26 DIAGNOSIS — G8929 Other chronic pain: Secondary | ICD-10-CM | POA: Diagnosis not present

## 2011-06-26 DIAGNOSIS — I359 Nonrheumatic aortic valve disorder, unspecified: Secondary | ICD-10-CM | POA: Insufficient documentation

## 2011-06-26 DIAGNOSIS — J438 Other emphysema: Secondary | ICD-10-CM | POA: Diagnosis not present

## 2011-06-26 DIAGNOSIS — M549 Dorsalgia, unspecified: Secondary | ICD-10-CM | POA: Diagnosis not present

## 2011-06-26 DIAGNOSIS — I10 Essential (primary) hypertension: Secondary | ICD-10-CM | POA: Insufficient documentation

## 2011-06-26 DIAGNOSIS — I6529 Occlusion and stenosis of unspecified carotid artery: Secondary | ICD-10-CM | POA: Diagnosis not present

## 2011-06-26 DIAGNOSIS — G609 Hereditary and idiopathic neuropathy, unspecified: Secondary | ICD-10-CM | POA: Insufficient documentation

## 2011-07-16 ENCOUNTER — Encounter: Payer: Self-pay | Admitting: Cardiovascular Disease

## 2011-07-17 ENCOUNTER — Telehealth: Payer: Self-pay | Admitting: Cardiovascular Disease

## 2011-07-17 NOTE — Telephone Encounter (Signed)
Pt having back injections and needs to come off   pradaxa 5 days prior, pls call [phone]

## 2011-07-17 NOTE — Telephone Encounter (Signed)
Left message to call back  

## 2011-07-17 NOTE — Telephone Encounter (Signed)
This is okay. The patient is at low risk of temporary interruption of pradaxa.

## 2011-07-17 NOTE — Telephone Encounter (Signed)
I spoke with Fernando Moyer and the pt is scheduled for an epidural injection on 07/24/11 with Dr Ethelene Hal.  The pt needs approval from Dr Excell Seltzer to hold Pradaxa 5 days prior to injection.  I will forward this message to Dr Excell Seltzer for review.

## 2011-07-18 NOTE — Telephone Encounter (Signed)
I spoke with Fernando Moyer and made her aware of Dr Earmon Phoenix recommendation.

## 2011-07-18 NOTE — Telephone Encounter (Signed)
Fu call °Pt returning your call  °

## 2011-07-24 ENCOUNTER — Institutional Professional Consult (permissible substitution) (INDEPENDENT_AMBULATORY_CARE_PROVIDER_SITE_OTHER): Payer: Medicare Other | Admitting: Surgery

## 2011-07-24 ENCOUNTER — Encounter: Payer: Self-pay | Admitting: Surgery

## 2011-07-24 VITALS — BP 131/68 | HR 92 | Resp 20 | Ht 67.0 in | Wt 216.0 lb

## 2011-07-24 DIAGNOSIS — M5137 Other intervertebral disc degeneration, lumbosacral region: Secondary | ICD-10-CM | POA: Diagnosis not present

## 2011-07-24 DIAGNOSIS — I359 Nonrheumatic aortic valve disorder, unspecified: Secondary | ICD-10-CM | POA: Diagnosis not present

## 2011-07-24 DIAGNOSIS — I350 Nonrheumatic aortic (valve) stenosis: Secondary | ICD-10-CM

## 2011-07-26 ENCOUNTER — Other Ambulatory Visit: Payer: Self-pay

## 2011-07-26 DIAGNOSIS — I359 Nonrheumatic aortic valve disorder, unspecified: Secondary | ICD-10-CM

## 2011-08-01 ENCOUNTER — Encounter (HOSPITAL_COMMUNITY): Payer: Self-pay | Admitting: Pharmacy Technician

## 2011-08-02 ENCOUNTER — Encounter: Payer: Self-pay | Admitting: Surgery

## 2011-08-02 NOTE — Progress Notes (Signed)
[address]            [place] [address]          [phone]      PCP is Fernando Ora, MD, MD Referring Provider is Fernando Bollman, MD  Chief Complaint  Patient presents with  . Aortic Stenosis    Referral from Dr Fernando Moyer for eval AS, 2D Echo 06/26/11    HPI:  The patient is a 78 year old gentleman with a history of carotid artery disease and right iliac artery occlusion who had a history of moderate aortic stenosis by echocardiogram in April 2012 with a mean gradient of 35 at that time. He was admitted to the hospital in November of 2012 with chest pain and was found to be in atrial fibrillation with rapid ventricular response. He converted on Cardizem and was subsequently started on a beta blocker and Pradaxa. A repeat echocardiogram showed an increase in the mean gradient to  39 mm mercury with moderate to severe aortic stenosis. He underwent cardiac catheterization showing moderately severe aortic stenosis with a calculated valve area of 0.9 cm with nonobstructive coronary disease. He had a repeat echocardiogram in March of 2013 showing progression of his aortic stenosis to severe with and increase in his mean gradient to 50 mm mercury. He has had dyspnea with exertion, although he has been somewhat limited due to lower extremity claudication and degenerative lower spine disease. He had pulmonary function testing performed which showed mild obstructive airway disease and moderately reduced diffusion capacity of 62% of predicted.  Past Medical History  Diagnosis Date  . GERD with stricture     w/hx of stricture  . PVD (peripheral vascular disease) 12/07    per cath 12/07....iliac  . Carotid artery occlusion     last u/s 3-11...Marland Kitchenper vasc.surgery 2007 cath: nonobstructive CAD. mil;d MS, trivial AoS, small AAA,  . Bowen's disease     right arm BX: referred to dermatology  . Hyperlipidemia   . Emphysema     PFT 4/10 FVC 3.45 (90%), FEV1 2.72 (106%),  TLC 5.36 (93%), DLCO 74%, NO BD RESPONSE: ? OF PSEUDONORMALIZATION OF PFT FINDINGS W/BOTH OBSTRUCTIVE AND RESTRICTIVE DEFECTS.  Marland Kitchen AAA (abdominal aortic aneurysm) 05/2008    PER CARDIAC CATH  . Aortic stenosis 05/2008    PER CARDIAC CATH, MILD, MITRAL STENOSIS  . Mitral stenosis 05/2008    PER CARDIAC CATH  . Coronary artery disease 05/2008    MILD, MEDICAL MANAGEMENT  . Pulmonary hypertension   . COPD (chronic obstructive pulmonary disease)   . Hypoxemia   . Hyperlipidemia   . Hypertension 02/21/11    pt denies this hx  . Heart murmur   . GERD (gastroesophageal reflux disease)   . Dyspnea      w/u included a cath and PFT's, sxms thought to be pulmonary  . Chronic back pain   . Peripheral neuropathy     right thigh; "it's been that way for years"  . Myocardial infarction     Past Surgical History  Procedure Date  . Appendectomy   . Esophageal dilation   . Cardiac catheterization 02/27/06    Fernando Moyer: CORONARY ANGIOGRAPHY, ABDOMINAL AORTOGRAPHY  . Endarterectomy 08-2010    Right carotid endarterectomy    . Carotid endarterectomy 08/2010    Family History  Problem Relation Age of Onset  . Heart attack Father 41  MI  . Diabetes Neg Hx   . Colon cancer Neg Hx   . Prostate cancer Neg Hx     Social History History  Substance Use Topics  . Smoking status: Former Smoker -- 2.0 packs/day for 30 years    Types: Cigarettes    Quit date: 04/02/1986  . Smokeless tobacco: Never Used   Comment: quit smoking cigarettes 04/1987  . Alcohol Use: No    Current Outpatient Prescriptions  Medication Sig Dispense Refill  . omeprazole (PRILOSEC) 20 MG capsule Take 20 mg by mouth daily.       . clindamycin (CLINDAGEL) 1 % gel Apply 1 application topically 2 (two) times daily.      . dabigatran (PRADAXA) 150 MG CAPS Take 150 mg by mouth every 12 (twelve) hours.      . metoprolol tartrate (LOPRESSOR) 25 MG tablet Take 25 mg by mouth 2 (two) times daily.      . pravastatin (PRAVACHOL) 40  MG tablet Take 40 mg by mouth every evening.        No Known Allergies  Review of Systems  Constitutional: Positive for fatigue. Negative for fever, chills, activity change, appetite change and unexpected weight change.  HENT: Negative.        Sees a dentist regularly.  Eyes: Negative.   Respiratory: Positive for shortness of breath.   Cardiovascular: Negative for chest pain, palpitations and leg swelling.       Right calf claudication  Gastrointestinal: Negative.   Genitourinary: Negative.   Musculoskeletal: Positive for back pain.  Neurological: Negative.   Hematological: Negative.   Psychiatric/Behavioral: Negative.     BP 131/68  Pulse 92  Resp 20  Ht 5\' 7"  (1.702 m)  Wt 216 lb (97.977 kg)  BMI 33.83 kg/m2  SpO2 97% Physical Exam  Constitutional: He is oriented to person, place, and time. He appears well-developed and well-nourished.       Looks uncomfortable.  Just had spinal injection by Dr. Ethelene Moyer.  HENT:  Head: Normocephalic and atraumatic.  Mouth/Throat: Oropharynx is clear and moist.       Teeth in fair condition  Eyes: Conjunctivae and EOM are normal. Pupils are equal, round, and reactive to light.  Neck: No JVD present. No thyromegaly present.  Cardiovascular: Normal rate and regular rhythm.  Exam reveals no gallop and no friction rub.   Murmur heard.      III/VI SEM  Pulmonary/Chest: Effort normal and breath sounds normal. No respiratory distress.  Abdominal: Soft. Bowel sounds are normal. He exhibits no distension and no mass. There is no tenderness.  Musculoskeletal: He exhibits no edema and no tenderness.  Lymphadenopathy:    He has no cervical adenopathy.  Neurological: He is alert and oriented to person, place, and time. He has normal strength. No cranial nerve deficit or sensory deficit.  Skin: Skin is warm and dry.  Psychiatric: He has a normal mood and affect.     Diagnostic Tests:  Procedural Findings: Hemodynamics RA 8 RV 41/12 PA 47/12  with a mean of 23 PCWP 17 LV 145/12 AO 117/50 with a mean of 78  Oxygen saturations: PA 66% AO 94%  Cardiac Output (Fick) 5.0 L per minute  Cardiac Index (Fick) 2.4 L per minute per meter square  Aortic valve mean gradient 28 mm mercury, aortic valve area 0.9 cm          Coronary angiography: Coronary dominance: right  Left mainstem: patent without significant stenosis  Left anterior descending (LAD):  the 50% mid stenosis after the first diagonal branch, there is no high grade obstructive disease present.  Left circumflex (LCx): left circumflex is patent without significant stenosis. There are 2 obtuse marginal branches without significant stenosis.  Right coronary artery (RCA): there are scattered 30-40% stenoses in the proximal, mid, and distal right coronary artery. The vessel is dominant with a PDA branch without significant stenosis.  Left ventriculography: Left ventricular systolic function is normal, LVEF is estimated at 55-65%, there is no significant mitral regurgitation. There is heavy mitral annular calcification, there is heavy aortic valve calcification.  Final Conclusions:   1. Mild diffuse nonobstructive coronary artery disease as above 2. Severe aortic stenosis with an aortic valve area of 0.9 cm 3. Normal left ventricular systolic function  Recommendations:  The patient appears to have had a small non-ST elevation infarction related to demand ischemia from rapid atrial fibrillation. This is in the setting of moderately severe aortic stenosis. Recommend initiation of anticoagulation. Will use Pradaxa 150 mg twice daily. I have reviewed the risks, potential benefit, and alternatives to anticoagulation. I have also discussed the positives and negatives of the direct thrombin inhibitor versus warfarin and the patient prefers Pradaxa.  His aortic stenosis has progressed and we will have a discussion about the consideration of aortic valve surgery. He will likely be  discharged in the morning.   Fernando Moyer 02/23/2011, 5:20 PM  ------------------------------------------------------------ Transthoracic Echocardiography  Patient:    Renel, Ende MR #:       [id number] Study Date: 06/26/2011 Gender:     M Age:        77 Height:     170.2cm Weight:     98.9kg BSA:        2.42m^2 Pt. Status: Room:    ATTENDING    Olga Millers, MD, [facility]  ORDERING     Fernando Bollman, MD  REFERRING    Fernando Bollman, MD  SONOGRAPHER  Aida Raider, RDCS  PERFORMING   [facility] cc:  ------------------------------------------------------------ LV EF: 60% -   65%  ------------------------------------------------------------ Indications:      424.1 Aortic valve disorders.  ------------------------------------------------------------ History:   PMH:  Acquired from the patient and from the patient's chart.  PMH:  NSTEMI. Atrial Fibrillation. CAD. Pulmonary hypertension. AAA. Peripheral vascular disease. Peripheral Neuropathy. Chest pain. Dizziness. Emphysema. Carotid Artery disease. Murmur. Dyspnea. Chronic back pain. Risk factors:  Hypertension. Dyslipidemia.  ------------------------------------------------------------ Study Conclusions  - Left ventricle: The cavity size was normal. Wall thickness   was increased in a pattern of mild LVH. There was mild   focal basal hypertrophy of the septum. Systolic function   was normal. The estimated ejection fraction was in the   range of 60% to 65%. Wall motion was normal; there were no   regional wall motion abnormalities. Doppler parameters are   consistent with abnormal left ventricular relaxation   (grade 1 diastolic dysfunction). Doppler parameters are   consistent with high ventricular filling pressure. - Aortic valve: Valve mobility was restricted. There was   severe stenosis. - Mitral valve: Calcified annulus. Mild regurgitation. - Left atrium: The atrium was moderately dilated. -  Pulmonary arteries: Systolic pressure was moderately   increased. PA peak pressure: 46mm Hg (S). Impressions:  - Severe AS (mean gradient of 50 mmHg).  ------------------------------------------------------------ Labs, prior tests, procedures, and surgery: Cardiac Cath- March 2010.          Transthoracic echocardiography.  M-mode, complete 2D, spectral Doppler, and color Doppler.  Height:  Height: 170.2cm. Height: 67in. Weight:  Weight: 98.9kg. Weight: 217.5lb.  Body mass index: BMI: 34.1kg/m^2.  Body surface area:    BSA: 2.7m^2.  Blood pressure:     108/73.  Patient status:  Outpatient. Location:  [facility]  ------------------------------------------------------------  ------------------------------------------------------------ Left ventricle:  The cavity size was normal. Wall thickness was increased in a pattern of mild LVH. There was mild focal basal hypertrophy of the septum. Systolic function was normal. The estimated ejection fraction was in the range of 60% to 65%. Wall motion was normal; there were no regional wall motion abnormalities. Doppler parameters are consistent with abnormal left ventricular relaxation (grade 1 diastolic dysfunction). Doppler parameters are consistent with high ventricular filling pressure.  ------------------------------------------------------------ Aortic valve:   Trileaflet; moderately calcified leaflets. Valve mobility was restricted.  Doppler:   There was severe stenosis.    No regurgitation.    VTI ratio of LVOT to aortic valve: 0.26. Indexed valve area: 0.39cm^2/m^2 (VTI). Peak velocity ratio of LVOT to aortic valve: 0.31. Indexed valve area: 0.47cm^2/m^2 (Vmax).  ------------------------------------------------------------ Aorta:  Aortic root: The aortic root was normal in size.  ------------------------------------------------------------ Mitral valve:   Calcified annulus. Mobility was not restricted.  Doppler:   Mild  regurgitation.    Indexed valve area by pressure half-time: 1.03cm^2/m^2. Indexed valve area by continuity equation (using LVOT flow): 0.94cm^2/m^2. Mean gradient: 5mm Hg (D). Peak gradient: 9mm Hg (D).  ------------------------------------------------------------ Left atrium:  The atrium was moderately dilated.  ------------------------------------------------------------ Right ventricle:  The cavity size was normal. Systolic function was normal.  ------------------------------------------------------------ Pulmonic valve:    Doppler:  Transvalvular velocity was within the normal range. There was no evidence for stenosis.   ------------------------------------------------------------ Tricuspid valve:   Structurally normal valve.    Doppler: Transvalvular velocity was within the normal range.  Mild regurgitation.  ------------------------------------------------------------ Pulmonary artery:   Systolic pressure was moderately increased.  ------------------------------------------------------------ Right atrium:  The atrium was normal in size.  ------------------------------------------------------------ Pericardium:  There was no pericardial effusion.  ------------------------------------------------------------  2D measurements        Normal  Doppler measurements   Normal Left ventricle                 Main pulmonary LVID ED,   38.8 mm     43-52   artery chord,                         Pressure,    46 mm Hg  =30 PLAX                           S LVID ES,   27.9 mm     23-38   Left ventricle chord,                         Ea, lat    5.48 cm/s   ------ PLAX                           ann, tiss FS, chord,   28 %      >29     DP PLAX                           E/Ea, lat  26.6        ------ LVPW, ED  12.2 mm     ------  ann, tiss     4 IVS/LVPW   1.26        <1.3    DP ratio, ED                      Ea, med    6.47 cm/s   ------ Ventricular septum             ann, tiss IVS, ED     15.4 mm     ------  DP LVOT                           E/Ea, med  22.5        ------ Diam, S      20 mm     ------  ann, tiss     7 Area       3.14 cm^2   ------  DP Diam         20 mm     ------  LVOT Aorta                          Peak vel,   133 cm/s   ------ Root diam,   31 mm     ------  S ED                             VTI, S     22.6 cm     ------ Left atrium                    Peak          7 mm Hg  ------ AP dim       51 mm     ------  gradient, AP dim     2.43 cm/m^2 <2.2    S index                          Stroke vol   71 ml     ------                                Stroke     33.8 ml/m^2 ------                                index                                Aortic valve                                Peak vel,   426 cm/s   ------                                S                                Mean vel,   336 cm/s   ------  S                                VTI, S       86 cm     ------                                VTI ratio  0.26        ------                                LVOT/AV                                Area index 0.39 cm^2/m ------                                (VTI)           ^2                                Peak vel   0.31        ------                                ratio,                                LVOT/AV                                Area index 0.47 cm^2/m ------                                (Vmax)          ^2                                Mitral valve                                Peak E vel  146 cm/s   ------                                Peak A vel  154 cm/s   ------                                Mean vel,   108 cm/s   ------                                D  Decelerati  215 ms     150-23                                on time                0                                Pressure    102 ms     ------                                half-time                                 Mean          5 mm Hg  ------                                gradient,                                D                                Peak          9 mm Hg  ------                                gradient,                                D                                Peak E/A    0.9        ------                                ratio                                Area index 1.03 cm^2/m ------                                (PHT)           ^2                                Area index 0.94 cm^2/m ------                                (LVOT           Karie Fetch  cont)                                Annulus    35.9 cm     ------                                VTI                                Tricuspid valve                                Regurg      322 cm/s   ------                                peak vel                                Peak RV-RA   41 mm Hg  ------                                gradient,                                S                                Systemic veins                                Estimated     5 mm Hg  ------                                CVP                                Right ventricle                                Pressure,    46 mm Hg  <30                                S                                Sa vel,    16.2 cm/s   ------                                lat ann,  tiss DP   ------------------------------------------------------------ Prepared and Electronically Authenticated by  Olga Millers, MD, [facility] 2013-03-26T15:12:[id number]        Impression:  The patient has severe aortic stenosis with dyspnea on exertion and occasional dizziness. He has nonobstructive coronary disease involving the LAD and right coronary artery. I agree that it is best to proceed with aortic valve replacement before he has progressive left ventricular dysfunction. I discussed the pros and cons of  mechanical and tissue valves and my recommendation for using a tissue valve given his age and comorbid factors. He is in agreement with using a tissue valve. I discussed the operative procedure with the patient and family including alternatives, benefits and risks; including but not limited to bleeding, blood transfusion, infection, stroke, myocardial infarction, graft failure, heart block requiring a permanent pacemaker, organ dysfunction, and death.  Rolla Plate understands and agrees to proceed.    Plan:  Patient will call to schedule AVR after discussing timing with his family.  He will need Pradaxa stopped 1 week prior.

## 2011-08-15 ENCOUNTER — Ambulatory Visit (HOSPITAL_COMMUNITY)
Admission: RE | Admit: 2011-08-15 | Discharge: 2011-08-15 | Disposition: A | Discharge status: Home / Self Care | Payer: Medicare Other | Source: Ambulatory Visit | Attending: Surgery | Admitting: Surgery

## 2011-08-15 ENCOUNTER — Encounter (HOSPITAL_COMMUNITY): Payer: Self-pay

## 2011-08-15 ENCOUNTER — Encounter (HOSPITAL_COMMUNITY)
Admission: RE | Admit: 2011-08-15 | Discharge: 2011-08-15 | Disposition: A | Discharge status: Home / Self Care | Payer: Medicare Other | Source: Ambulatory Visit | Attending: Surgery | Admitting: Surgery

## 2011-08-15 VITALS — BP 121/87 | HR 59 | Temp 97.3°F | Resp 20 | Ht 67.0 in | Wt 212.1 lb

## 2011-08-15 DIAGNOSIS — I359 Nonrheumatic aortic valve disorder, unspecified: Secondary | ICD-10-CM

## 2011-08-15 DIAGNOSIS — Z01812 Encounter for preprocedural laboratory examination: Secondary | ICD-10-CM | POA: Insufficient documentation

## 2011-08-15 DIAGNOSIS — Z01810 Encounter for preprocedural cardiovascular examination: Secondary | ICD-10-CM | POA: Insufficient documentation

## 2011-08-15 DIAGNOSIS — Z01811 Encounter for preprocedural respiratory examination: Secondary | ICD-10-CM | POA: Insufficient documentation

## 2011-08-15 DIAGNOSIS — Z01818 Encounter for other preprocedural examination: Secondary | ICD-10-CM | POA: Insufficient documentation

## 2011-08-15 DIAGNOSIS — K449 Diaphragmatic hernia without obstruction or gangrene: Secondary | ICD-10-CM | POA: Diagnosis not present

## 2011-08-15 LAB — URINALYSIS, ROUTINE W REFLEX MICROSCOPIC
Leukocytes, UA: NEGATIVE
Nitrite: NEGATIVE
Specific Gravity, Urine: 1.013 (ref 1.005–1.030)
Urobilinogen, UA: 0.2 mg/dL (ref 0.0–1.0)

## 2011-08-15 LAB — COMPREHENSIVE METABOLIC PANEL
AST: 20 U/L (ref 0–37)
BUN: 13 mg/dL (ref 6–23)
CO2: 27 mEq/L (ref 19–32)
Chloride: 103 mEq/L (ref 96–112)
Creatinine, Ser: 0.87 mg/dL (ref 0.50–1.35)
GFR calc non Af Amer: 82 mL/min — ABNORMAL LOW (ref >90)
Total Bilirubin: 1.4 mg/dL — ABNORMAL HIGH (ref 0.3–1.2)

## 2011-08-15 LAB — BLOOD GAS, ARTERIAL
Acid-Base Excess: 0.1 mmol/L (ref 0.0–2.0)
TCO2: 25 mmol/L (ref 0–100)
pCO2 arterial: 36.3 mmHg (ref 35.0–45.0)
pO2, Arterial: 72.5 mmHg — ABNORMAL LOW (ref 80.0–100.0)

## 2011-08-15 LAB — SURGICAL PCR SCREEN
MRSA, PCR: NEGATIVE
Staphylococcus aureus: NEGATIVE

## 2011-08-15 LAB — CBC
HCT: 44.1 % (ref 39.0–52.0)
MCV: 85.8 fL (ref 78.0–100.0)
Platelets: 191 10*3/uL (ref 150–400)
RBC: 5.14 MIL/uL (ref 4.22–5.81)
WBC: 8 10*3/uL (ref 4.0–10.5)

## 2011-08-15 LAB — TYPE AND SCREEN: Antibody Screen: NEGATIVE

## 2011-08-15 LAB — PULMONARY FUNCTION TEST

## 2011-08-15 MED ORDER — CHLORHEXIDINE GLUCONATE 4 % EX LIQD: 30.0000 mL | CUTANEOUS | Status: DC

## 2011-08-15 NOTE — Pre-Procedure Instructions (Signed)
20 SANTIEL TOPPER  08/15/2011   Your procedure is scheduled on:  Friday Aug 17, 2011.  Report to [facility] at 0530 AM.  Call this number if you have problems the morning of surgery: [phone]   Remember:   Do not eat food:After Midnight.  May have clear liquids: up to 4 Hours before arrival until 0130 am.  Clear liquids include soda, tea, black coffee, apple or grape juice, broth.  Take these medicines the morning of surgery with A SIP OF WATER: Metoprolol (Lopressor), and Omeprazole (Prilosec).   Do not wear jewelry, make-up or nail polish.  Do not wear lotions, powders, or perfumes. You may wear deodorant.  Do not shave 48 hours prior to surgery. Men may shave face and neck.  Do not bring valuables to the hospital.  Contacts, dentures or bridgework may not be worn into surgery.  Leave suitcase in the car. After surgery it may be brought to your room.  For patients admitted to the hospital, checkout time is 11:00 AM the day of discharge.   Patients discharged the day of surgery will not be allowed to drive home.  Name and phone number of your driver:   Special Instructions: CHG Shower Use Special Wash: 1/2 bottle night before surgery and 1/2 bottle morning of surgery.   Please read over the following fact sheets that you were given: Pain Booklet, Coughing and Deep Breathing, Blood Transfusion Information, MRSA Information and Surgical Site Infection Prevention

## 2011-08-15 NOTE — Progress Notes (Signed)
p02 results from arterial blood gas level were called to [facility] at Dr. Sharee Pimple office.

## 2011-08-16 LAB — HEMOGLOBIN A1C: Mean Plasma Glucose: 134 mg/dL — ABNORMAL HIGH (ref <117)

## 2011-08-16 MED ORDER — SODIUM CHLORIDE 0.9 % IV SOLN
INTRAVENOUS | Status: AC
  Administered 2011-08-17: 1 [IU]/h via INTRAVENOUS
  Filled 2011-08-16: qty 1

## 2011-08-16 MED ORDER — SODIUM BICARBONATE 8.4 % IV SOLN
INTRAVENOUS | Status: DC
  Filled 2011-08-16: qty 2.5

## 2011-08-16 MED ORDER — TRANEXAMIC ACID (OHS) BOLUS VIA INFUSION
15.0000 mg/kg | INTRAVENOUS | Status: AC
  Administered 2011-08-17: 1443 mg via INTRAVENOUS
  Filled 2011-08-16: qty 1443

## 2011-08-16 MED ORDER — TRANEXAMIC ACID 100 MG/ML IV SOLN
1.5000 mg/kg/h | INTRAVENOUS | Status: AC
  Administered 2011-08-17: 1.5 mg/kg/h via INTRAVENOUS
  Filled 2011-08-16: qty 25

## 2011-08-16 MED ORDER — EPINEPHRINE HCL 1 MG/ML IJ SOLN
0.5000 ug/min | INTRAMUSCULAR | Status: DC
  Filled 2011-08-16: qty 4

## 2011-08-16 MED ORDER — DEXTROSE 5 % IV SOLN
1.5000 g | INTRAVENOUS | Status: AC
  Administered 2011-08-17: 1.5 g via INTRAVENOUS
  Administered 2011-08-17: .75 g via INTRAVENOUS
  Filled 2011-08-16: qty 1.5

## 2011-08-16 MED ORDER — VANCOMYCIN HCL 1000 MG IV SOLR
1500.0000 mg | INTRAVENOUS | Status: AC
  Administered 2011-08-17: 1500 mg via INTRAVENOUS
  Filled 2011-08-16: qty 1500

## 2011-08-16 MED ORDER — POTASSIUM CHLORIDE 2 MEQ/ML IV SOLN
80.0000 meq | INTRAVENOUS | Status: DC
  Filled 2011-08-16: qty 40

## 2011-08-16 MED ORDER — MAGNESIUM SULFATE 50 % IJ SOLN
40.0000 meq | INTRAMUSCULAR | Status: DC
  Filled 2011-08-16: qty 10

## 2011-08-16 MED ORDER — METOPROLOL TARTRATE 12.5 MG HALF TABLET: 12.5000 mg | ORAL_TABLET | Freq: Once | ORAL | Status: DC

## 2011-08-16 MED ORDER — TRANEXAMIC ACID (OHS) PUMP PRIME SOLUTION
2.0000 mg/kg | INTRAVENOUS | Status: DC
  Filled 2011-08-16: qty 1.92

## 2011-08-16 MED ORDER — NITROGLYCERIN IN D5W 200-5 MCG/ML-% IV SOLN
2.0000 ug/min | INTRAVENOUS | Status: AC
  Administered 2011-08-17: 5 ug/min via INTRAVENOUS
  Filled 2011-08-16: qty 250

## 2011-08-16 MED ORDER — DEXTROSE 5 % IV SOLN
30.0000 ug/min | INTRAVENOUS | Status: DC
  Filled 2011-08-16: qty 2

## 2011-08-16 MED ORDER — DEXTROSE 5 % IV SOLN
750.0000 mg | INTRAVENOUS | Status: DC
  Filled 2011-08-16: qty 750

## 2011-08-16 MED ORDER — SODIUM CHLORIDE 0.9 % IV SOLN
0.1000 ug/kg/h | INTRAVENOUS | Status: AC
  Administered 2011-08-17: .3 ug/kg/h via INTRAVENOUS
  Filled 2011-08-16: qty 4

## 2011-08-16 MED ORDER — DOPAMINE-DEXTROSE 3.2-5 MG/ML-% IV SOLN
2.0000 ug/kg/min | INTRAVENOUS | Status: DC
  Filled 2011-08-16: qty 250

## 2011-08-16 NOTE — Progress Notes (Signed)
VASCULAR LAB PRELIMINARY  PRELIMINARY  PRELIMINARY  PRELIMINARY  Pre-op Cardiac Surgery  Carotid Findings:  Right 40% to 59 % ICA stenosis probably due to tortuosity. Left - 40% to 59 % ICA stenosis mid to upper end of range.152  Upper Extremity Right Left  Brachial Pressures 152 Biphasic 107 Monophasic  Radial Waveforms Triphasic Monophasic  Ulnar Waveforms Biphasic Monophasic  Palmar Arch (Allen's Test) Normal Abnormal   Findings:  Doppler waveforms on the right remained normal with both radial and ulnar compressions. Left Doppler waveforms obliterated with radial compression and remained normal with ulnar compression                               Kacie Huxtable D, 08/16/2011, 11:00PM

## 2011-08-17 ENCOUNTER — Encounter (HOSPITAL_COMMUNITY)
Admission: RE | Disposition: A | Discharge status: Home / Self Care | Payer: Self-pay | Source: Ambulatory Visit | Attending: Surgery

## 2011-08-17 ENCOUNTER — Encounter (HOSPITAL_COMMUNITY): Payer: Self-pay | Admitting: Anesthesiology

## 2011-08-17 ENCOUNTER — Ambulatory Visit (HOSPITAL_COMMUNITY): Payer: Medicare Other | Admitting: Anesthesiology

## 2011-08-17 ENCOUNTER — Inpatient Hospital Stay (HOSPITAL_COMMUNITY)
Admission: RE | Admit: 2011-08-17 | Discharge: 2011-08-24 | DRG: 220 | Disposition: A | Discharge status: Home / Self Care | Payer: Medicare Other | Source: Ambulatory Visit | Attending: Surgery | Admitting: Surgery

## 2011-08-17 ENCOUNTER — Inpatient Hospital Stay (HOSPITAL_COMMUNITY): Payer: Medicare Other

## 2011-08-17 DIAGNOSIS — J90 Pleural effusion, not elsewhere classified: Secondary | ICD-10-CM | POA: Diagnosis not present

## 2011-08-17 DIAGNOSIS — D62 Acute posthemorrhagic anemia: Secondary | ICD-10-CM | POA: Diagnosis not present

## 2011-08-17 DIAGNOSIS — I2789 Other specified pulmonary heart diseases: Secondary | ICD-10-CM | POA: Diagnosis present

## 2011-08-17 DIAGNOSIS — J9819 Other pulmonary collapse: Secondary | ICD-10-CM | POA: Diagnosis not present

## 2011-08-17 DIAGNOSIS — J449 Chronic obstructive pulmonary disease, unspecified: Secondary | ICD-10-CM | POA: Diagnosis present

## 2011-08-17 DIAGNOSIS — Z7982 Long term (current) use of aspirin: Secondary | ICD-10-CM | POA: Diagnosis not present

## 2011-08-17 DIAGNOSIS — I739 Peripheral vascular disease, unspecified: Secondary | ICD-10-CM | POA: Diagnosis present

## 2011-08-17 DIAGNOSIS — I714 Abdominal aortic aneurysm, without rupture, unspecified: Secondary | ICD-10-CM | POA: Diagnosis present

## 2011-08-17 DIAGNOSIS — J4489 Other specified chronic obstructive pulmonary disease: Secondary | ICD-10-CM | POA: Diagnosis present

## 2011-08-17 DIAGNOSIS — K219 Gastro-esophageal reflux disease without esophagitis: Secondary | ICD-10-CM | POA: Diagnosis present

## 2011-08-17 DIAGNOSIS — I10 Essential (primary) hypertension: Secondary | ICD-10-CM | POA: Diagnosis present

## 2011-08-17 DIAGNOSIS — G609 Hereditary and idiopathic neuropathy, unspecified: Secondary | ICD-10-CM | POA: Diagnosis present

## 2011-08-17 DIAGNOSIS — E8779 Other fluid overload: Secondary | ICD-10-CM | POA: Diagnosis not present

## 2011-08-17 DIAGNOSIS — Z79899 Other long term (current) drug therapy: Secondary | ICD-10-CM | POA: Diagnosis not present

## 2011-08-17 DIAGNOSIS — I442 Atrioventricular block, complete: Secondary | ICD-10-CM | POA: Diagnosis not present

## 2011-08-17 DIAGNOSIS — I4891 Unspecified atrial fibrillation: Secondary | ICD-10-CM | POA: Diagnosis not present

## 2011-08-17 DIAGNOSIS — E785 Hyperlipidemia, unspecified: Secondary | ICD-10-CM | POA: Diagnosis present

## 2011-08-17 DIAGNOSIS — Z952 Presence of prosthetic heart valve: Secondary | ICD-10-CM

## 2011-08-17 DIAGNOSIS — R918 Other nonspecific abnormal finding of lung field: Secondary | ICD-10-CM | POA: Diagnosis not present

## 2011-08-17 DIAGNOSIS — K5900 Constipation, unspecified: Secondary | ICD-10-CM | POA: Diagnosis not present

## 2011-08-17 DIAGNOSIS — I2510 Atherosclerotic heart disease of native coronary artery without angina pectoris: Secondary | ICD-10-CM | POA: Diagnosis present

## 2011-08-17 DIAGNOSIS — Z87891 Personal history of nicotine dependence: Secondary | ICD-10-CM

## 2011-08-17 DIAGNOSIS — D696 Thrombocytopenia, unspecified: Secondary | ICD-10-CM | POA: Diagnosis not present

## 2011-08-17 DIAGNOSIS — I252 Old myocardial infarction: Secondary | ICD-10-CM

## 2011-08-17 DIAGNOSIS — Z9889 Other specified postprocedural states: Secondary | ICD-10-CM | POA: Diagnosis not present

## 2011-08-17 DIAGNOSIS — Z951 Presence of aortocoronary bypass graft: Secondary | ICD-10-CM | POA: Diagnosis not present

## 2011-08-17 DIAGNOSIS — J811 Chronic pulmonary edema: Secondary | ICD-10-CM | POA: Diagnosis not present

## 2011-08-17 DIAGNOSIS — I359 Nonrheumatic aortic valve disorder, unspecified: Secondary | ICD-10-CM | POA: Diagnosis not present

## 2011-08-17 DIAGNOSIS — I6529 Occlusion and stenosis of unspecified carotid artery: Secondary | ICD-10-CM | POA: Diagnosis present

## 2011-08-17 HISTORY — PX: AORTIC VALVE REPLACEMENT: SHX41

## 2011-08-17 LAB — POCT I-STAT 4, (NA,K, GLUC, HGB,HCT)
Glucose, Bld: 120 mg/dL — ABNORMAL HIGH (ref 70–99)
Glucose, Bld: 139 mg/dL — ABNORMAL HIGH (ref 70–99)
Glucose, Bld: 158 mg/dL — ABNORMAL HIGH (ref 70–99)
Glucose, Bld: 122 mg/dL — ABNORMAL HIGH (ref 70–99)
HCT: 39 % (ref 39.0–52.0)
HCT: 28 % — ABNORMAL LOW (ref 39.0–52.0)
HCT: 26 % — ABNORMAL LOW (ref 39.0–52.0)
HCT: 30 % — ABNORMAL LOW (ref 39.0–52.0)
Hemoglobin: 11.9 g/dL — ABNORMAL LOW (ref 13.0–17.0)
Hemoglobin: 9.5 g/dL — ABNORMAL LOW (ref 13.0–17.0)
Hemoglobin: 8.8 g/dL — ABNORMAL LOW (ref 13.0–17.0)
Hemoglobin: 10.2 g/dL — ABNORMAL LOW (ref 13.0–17.0)
Potassium: 4 mEq/L (ref 3.5–5.1)
Potassium: 4.2 mEq/L (ref 3.5–5.1)
Potassium: 4.9 mEq/L (ref 3.5–5.1)
Potassium: 3.7 mEq/L (ref 3.5–5.1)
Sodium: 141 mEq/L (ref 135–145)
Sodium: 141 mEq/L (ref 135–145)
Sodium: 137 mEq/L (ref 135–145)

## 2011-08-17 LAB — PROTIME-INR
INR: 1.44 (ref 0.00–1.49)
Prothrombin Time: 17.8 seconds — ABNORMAL HIGH (ref 11.6–15.2)

## 2011-08-17 LAB — POCT I-STAT 3, ART BLOOD GAS (G3+)
Acid-base deficit: 3 mmol/L — ABNORMAL HIGH (ref 0.0–2.0)
O2 Saturation: 93 %
Patient temperature: 35.4
Patient temperature: 37
TCO2: 27 mmol/L (ref 0–100)
pCO2 arterial: 41.3 mmHg (ref 35.0–45.0)
pCO2 arterial: 43.7 mmHg (ref 35.0–45.0)
pH, Arterial: 7.398 (ref 7.350–7.450)
pH, Arterial: 7.346 — ABNORMAL LOW (ref 7.350–7.450)
pH, Arterial: 7.361 (ref 7.350–7.450)
pO2, Arterial: 75 mmHg — ABNORMAL LOW (ref 80.0–100.0)

## 2011-08-17 LAB — CBC
Hemoglobin: 10.8 g/dL — ABNORMAL LOW (ref 13.0–17.0)
Hemoglobin: 12.1 g/dL — ABNORMAL LOW (ref 13.0–17.0)
MCHC: 34.8 g/dL (ref 30.0–36.0)
Platelets: 120 10*3/uL — ABNORMAL LOW (ref 150–400)
RBC: 3.67 MIL/uL — ABNORMAL LOW (ref 4.22–5.81)
RBC: 4.18 MIL/uL — ABNORMAL LOW (ref 4.22–5.81)
WBC: 11.6 10*3/uL — ABNORMAL HIGH (ref 4.0–10.5)

## 2011-08-17 LAB — CREATININE, SERUM
Creatinine, Ser: 0.74 mg/dL (ref 0.50–1.35)
GFR calc Af Amer: >90 mL/min (ref >90)

## 2011-08-17 LAB — POCT I-STAT, CHEM 8
Creatinine, Ser: 0.8 mg/dL (ref 0.50–1.35)
HCT: 35 % — ABNORMAL LOW (ref 39.0–52.0)
Hemoglobin: 11.9 g/dL — ABNORMAL LOW (ref 13.0–17.0)
Potassium: 6.4 mEq/L (ref 3.5–5.1)
Sodium: 139 mEq/L (ref 135–145)
TCO2: 22 mmol/L (ref 0–100)

## 2011-08-17 LAB — GLUCOSE, CAPILLARY
Glucose-Capillary: 124 mg/dL — ABNORMAL HIGH (ref 70–99)
Glucose-Capillary: 140 mg/dL — ABNORMAL HIGH (ref 70–99)

## 2011-08-17 LAB — HEMOGLOBIN AND HEMATOCRIT, BLOOD: HCT: 28.6 % — ABNORMAL LOW (ref 39.0–52.0)

## 2011-08-17 LAB — PLATELET COUNT: Platelets: 140 10*3/uL — ABNORMAL LOW (ref 150–400)

## 2011-08-17 LAB — MAGNESIUM: Magnesium: 2.9 mg/dL — ABNORMAL HIGH (ref 1.5–2.5)

## 2011-08-17 SURGERY — REPLACEMENT, AORTIC VALVE, OPEN
Anesthesia: General | Site: Chest | Wound class: Clean

## 2011-08-17 MED ORDER — HEPARIN SODIUM (PORCINE) 1000 UNIT/ML IJ SOLN
INTRAMUSCULAR | Status: DC | PRN
  Administered 2011-08-17: 38000 [IU] via INTRAVENOUS

## 2011-08-17 MED ORDER — ROCURONIUM BROMIDE 100 MG/10ML IV SOLN
INTRAVENOUS | Status: DC | PRN
  Administered 2011-08-17: 60 mg via INTRAVENOUS
  Administered 2011-08-17: 40 mg via INTRAVENOUS

## 2011-08-17 MED ORDER — METOPROLOL TARTRATE 25 MG/10 ML ORAL SUSPENSION
12.5000 mg | Freq: Two times a day (BID) | ORAL | Status: DC
  Filled 2011-08-17 (×3): qty 5

## 2011-08-17 MED ORDER — ACETAMINOPHEN 160 MG/5ML PO SOLN: 650.0000 mg | ORAL | Status: AC

## 2011-08-17 MED ORDER — SIMVASTATIN 20 MG PO TABS
20.0000 mg | ORAL_TABLET | Freq: Every day | ORAL | Status: DC
  Administered 2011-08-19 – 2011-08-23 (×5): 20 mg via ORAL
  Filled 2011-08-17 (×8): qty 1

## 2011-08-17 MED ORDER — ASPIRIN 81 MG PO CHEW: 324.0000 mg | CHEWABLE_TABLET | Freq: Every day | ORAL | Status: DC

## 2011-08-17 MED ORDER — LACTATED RINGERS IV SOLN
INTRAVENOUS | Status: DC | PRN
  Administered 2011-08-17: 07:00:00 via INTRAVENOUS

## 2011-08-17 MED ORDER — DOCUSATE SODIUM 100 MG PO CAPS
200.0000 mg | ORAL_CAPSULE | Freq: Every day | ORAL | Status: DC
  Administered 2011-08-18 – 2011-08-20 (×3): 200 mg via ORAL
  Filled 2011-08-17 (×2): qty 2

## 2011-08-17 MED ORDER — FAMOTIDINE IN NACL 20-0.9 MG/50ML-% IV SOLN
20.0000 mg | Freq: Two times a day (BID) | INTRAVENOUS | Status: AC
  Administered 2011-08-17: 20 mg via INTRAVENOUS

## 2011-08-17 MED ORDER — SODIUM CHLORIDE 0.45 % IV SOLN
INTRAVENOUS | Status: DC
  Administered 2011-08-17: 12:00:00 via INTRAVENOUS

## 2011-08-17 MED ORDER — PANTOPRAZOLE SODIUM 40 MG PO TBEC
40.0000 mg | DELAYED_RELEASE_TABLET | Freq: Every day | ORAL | Status: DC
  Administered 2011-08-19 – 2011-08-20 (×2): 40 mg via ORAL
  Filled 2011-08-17 (×2): qty 1

## 2011-08-17 MED ORDER — METOPROLOL TARTRATE 1 MG/ML IV SOLN: 2.5000 mg | INTRAVENOUS | Status: DC | PRN

## 2011-08-17 MED ORDER — SODIUM CHLORIDE 0.9 % IJ SOLN
3.0000 mL | Freq: Two times a day (BID) | INTRAMUSCULAR | Status: DC
  Administered 2011-08-18 – 2011-08-19 (×4): 3 mL via INTRAVENOUS
  Administered 2011-08-20: 10 mL via INTRAVENOUS

## 2011-08-17 MED ORDER — INSULIN ASPART 100 UNIT/ML ~~LOC~~ SOLN
0.0000 [IU] | SUBCUTANEOUS | Status: AC
  Administered 2011-08-17 (×2): 2 [IU] via SUBCUTANEOUS

## 2011-08-17 MED ORDER — FENTANYL CITRATE 0.05 MG/ML IJ SOLN
INTRAMUSCULAR | Status: DC | PRN
  Administered 2011-08-17: 50 ug via INTRAVENOUS
  Administered 2011-08-17 (×2): 250 ug via INTRAVENOUS
  Administered 2011-08-17: 50 ug via INTRAVENOUS
  Administered 2011-08-17 (×2): 100 ug via INTRAVENOUS
  Administered 2011-08-17 (×2): 250 ug via INTRAVENOUS
  Administered 2011-08-17: 200 ug via INTRAVENOUS
  Administered 2011-08-17: 250 ug via INTRAVENOUS

## 2011-08-17 MED ORDER — THROMBIN 20000 UNITS EX KIT
PACK | OROMUCOSAL | Status: DC | PRN
  Administered 2011-08-17: 09:00:00 via TOPICAL

## 2011-08-17 MED ORDER — INSULIN REGULAR BOLUS VIA INFUSION
0.0000 [IU] | Freq: Three times a day (TID) | INTRAVENOUS | Status: DC
  Administered 2011-08-19: 3 [IU] via INTRAVENOUS
  Filled 2011-08-17: qty 10

## 2011-08-17 MED ORDER — ALBUMIN HUMAN 5 % IV SOLN
250.0000 mL | INTRAVENOUS | Status: AC | PRN
  Administered 2011-08-17 – 2011-08-18 (×3): 250 mL via INTRAVENOUS
  Filled 2011-08-17: qty 250

## 2011-08-17 MED ORDER — POTASSIUM CHLORIDE 10 MEQ/50ML IV SOLN
10.0000 meq | INTRAVENOUS | Status: AC
  Administered 2011-08-17 (×3): 10 meq via INTRAVENOUS

## 2011-08-17 MED ORDER — BISACODYL 10 MG RE SUPP: 10.0000 mg | Freq: Every day | RECTAL | Status: DC

## 2011-08-17 MED ORDER — PHENYLEPHRINE HCL 10 MG/ML IJ SOLN
0.0000 ug/min | INTRAVENOUS | Status: DC
  Administered 2011-08-18: 40 ug/min via INTRAVENOUS
  Filled 2011-08-17 (×2): qty 2

## 2011-08-17 MED ORDER — PHENYLEPHRINE HCL 10 MG/ML IJ SOLN
10.0000 mg | INTRAVENOUS | Status: DC | PRN
  Administered 2011-08-17: 20 ug/min via INTRAVENOUS

## 2011-08-17 MED ORDER — ASPIRIN EC 325 MG PO TBEC
325.0000 mg | DELAYED_RELEASE_TABLET | Freq: Every day | ORAL | Status: DC
  Administered 2011-08-18 – 2011-08-20 (×3): 325 mg via ORAL
  Filled 2011-08-17 (×3): qty 1

## 2011-08-17 MED ORDER — VANCOMYCIN HCL IN DEXTROSE 1-5 GM/200ML-% IV SOLN
1000.0000 mg | Freq: Once | INTRAVENOUS | Status: AC
Start: 2011-08-17 — End: 2011-08-17
  Administered 2011-08-17: 1000 mg via INTRAVENOUS
  Filled 2011-08-17: qty 200

## 2011-08-17 MED ORDER — INSULIN ASPART 100 UNIT/ML ~~LOC~~ SOLN
0.0000 [IU] | SUBCUTANEOUS | Status: DC
  Administered 2011-08-18 – 2011-08-20 (×11): 2 [IU] via SUBCUTANEOUS

## 2011-08-17 MED ORDER — ALBUMIN HUMAN 5 % IV SOLN
INTRAVENOUS | Status: DC | PRN
  Administered 2011-08-17 (×2): via INTRAVENOUS

## 2011-08-17 MED ORDER — NITROGLYCERIN IN D5W 200-5 MCG/ML-% IV SOLN: 0.0000 ug/min | INTRAVENOUS | Status: DC

## 2011-08-17 MED ORDER — MIDAZOLAM HCL 5 MG/5ML IJ SOLN
INTRAMUSCULAR | Status: DC | PRN
  Administered 2011-08-17 (×2): 3 mg via INTRAVENOUS
  Administered 2011-08-17 (×2): 2 mg via INTRAVENOUS

## 2011-08-17 MED ORDER — ACETAMINOPHEN 160 MG/5ML PO SOLN
975.0000 mg | Freq: Four times a day (QID) | ORAL | Status: DC
  Filled 2011-08-17: qty 40.6

## 2011-08-17 MED ORDER — LACTATED RINGERS IV SOLN: INTRAVENOUS | Status: DC

## 2011-08-17 MED ORDER — MORPHINE SULFATE 2 MG/ML IJ SOLN: 1.0000 mg | INTRAMUSCULAR | Status: AC | PRN

## 2011-08-17 MED ORDER — VECURONIUM BROMIDE 10 MG IV SOLR
INTRAVENOUS | Status: DC | PRN
  Administered 2011-08-17: 10 mg via INTRAVENOUS
  Administered 2011-08-17: 5 mg via INTRAVENOUS
  Administered 2011-08-17: 2 mg via INTRAVENOUS
  Administered 2011-08-17: 3 mg via INTRAVENOUS

## 2011-08-17 MED ORDER — SODIUM CHLORIDE 0.9 % IV SOLN
INTRAVENOUS | Status: DC
  Filled 2011-08-17: qty 1

## 2011-08-17 MED ORDER — ONDANSETRON HCL 4 MG/2ML IJ SOLN: 4.0000 mg | Freq: Four times a day (QID) | INTRAMUSCULAR | Status: DC | PRN

## 2011-08-17 MED ORDER — SODIUM CHLORIDE 0.9 % IV SOLN: 250.0000 mL | INTRAVENOUS | Status: DC

## 2011-08-17 MED ORDER — EPHEDRINE SULFATE 50 MG/ML IJ SOLN
INTRAMUSCULAR | Status: DC | PRN
  Administered 2011-08-17 (×2): 10 mg via INTRAVENOUS

## 2011-08-17 MED ORDER — 0.9 % SODIUM CHLORIDE (POUR BTL) OPTIME
TOPICAL | Status: DC | PRN
  Administered 2011-08-17: 1000 mL
  Administered 2011-08-17: 7000 mL

## 2011-08-17 MED ORDER — PROPOFOL 10 MG/ML IV EMUL
INTRAVENOUS | Status: DC | PRN
  Administered 2011-08-17: 30 mg via INTRAVENOUS
  Administered 2011-08-17: 40 mg via INTRAVENOUS
  Administered 2011-08-17: 20 mg via INTRAVENOUS
  Administered 2011-08-17: 50 mg via INTRAVENOUS
  Administered 2011-08-17: 30 mg via INTRAVENOUS

## 2011-08-17 MED ORDER — SODIUM CHLORIDE 0.9 % IJ SOLN: 3.0000 mL | INTRAMUSCULAR | Status: DC | PRN

## 2011-08-17 MED ORDER — DEXTROSE 5 % IV SOLN
1.5000 g | Freq: Two times a day (BID) | INTRAVENOUS | Status: AC
  Administered 2011-08-17 – 2011-08-19 (×4): 1.5 g via INTRAVENOUS
  Filled 2011-08-17 (×4): qty 1.5

## 2011-08-17 MED ORDER — OXYCODONE HCL 5 MG PO TABS
5.0000 mg | ORAL_TABLET | ORAL | Status: DC | PRN
  Administered 2011-08-17: 5 mg via ORAL
  Administered 2011-08-17 – 2011-08-20 (×6): 10 mg via ORAL
  Filled 2011-08-17 (×6): qty 2
  Filled 2011-08-17: qty 1
  Filled 2011-08-17: qty 2

## 2011-08-17 MED ORDER — SODIUM CHLORIDE 0.9 % IV SOLN
INTRAVENOUS | Status: DC
  Administered 2011-08-17: 12:00:00 via INTRAVENOUS

## 2011-08-17 MED ORDER — MIDAZOLAM HCL 2 MG/2ML IJ SOLN: 2.0000 mg | INTRAMUSCULAR | Status: DC | PRN

## 2011-08-17 MED ORDER — METOPROLOL TARTRATE 12.5 MG HALF TABLET
12.5000 mg | ORAL_TABLET | Freq: Two times a day (BID) | ORAL | Status: DC
  Filled 2011-08-17 (×3): qty 1

## 2011-08-17 MED ORDER — THROMBIN 20000 UNITS EX SOLR
CUTANEOUS | Status: DC | PRN
  Administered 2011-08-17: 20000 [IU] via TOPICAL

## 2011-08-17 MED ORDER — MAGNESIUM SULFATE 40 MG/ML IJ SOLN
4.0000 g | Freq: Once | INTRAMUSCULAR | Status: AC
  Administered 2011-08-17: 4 g via INTRAVENOUS
  Filled 2011-08-17: qty 100

## 2011-08-17 MED ORDER — ACETAMINOPHEN 650 MG RE SUPP
650.0000 mg | RECTAL | Status: AC
  Administered 2011-08-17: 650 mg via RECTAL

## 2011-08-17 MED ORDER — PROTAMINE SULFATE 10 MG/ML IV SOLN
INTRAVENOUS | Status: DC | PRN
  Administered 2011-08-17: 300 mg via INTRAVENOUS

## 2011-08-17 MED ORDER — LACTATED RINGERS IV SOLN
500.0000 mL | Freq: Once | INTRAVENOUS | Status: AC | PRN
  Administered 2011-08-17: 500 mL via INTRAVENOUS

## 2011-08-17 MED ORDER — ARTIFICIAL TEARS OP OINT
TOPICAL_OINTMENT | OPHTHALMIC | Status: DC | PRN
  Administered 2011-08-17: 1 via OPHTHALMIC

## 2011-08-17 MED ORDER — SODIUM CHLORIDE 0.9 % IV SOLN
0.1000 ug/kg/h | INTRAVENOUS | Status: DC
  Administered 2011-08-17: 0.1 ug/kg/h via INTRAVENOUS
  Filled 2011-08-17 (×2): qty 2

## 2011-08-17 MED ORDER — ACETAMINOPHEN 500 MG PO TABS
1000.0000 mg | ORAL_TABLET | Freq: Four times a day (QID) | ORAL | Status: DC
  Administered 2011-08-17 – 2011-08-20 (×9): 1000 mg via ORAL
  Filled 2011-08-17 (×13): qty 2

## 2011-08-17 MED ORDER — MORPHINE SULFATE 2 MG/ML IJ SOLN
2.0000 mg | INTRAMUSCULAR | Status: DC | PRN
  Administered 2011-08-17: 4 mg via INTRAVENOUS
  Administered 2011-08-17 (×2): 2 mg via INTRAVENOUS
  Administered 2011-08-17: 4 mg via INTRAVENOUS
  Administered 2011-08-17 – 2011-08-20 (×12): 2 mg via INTRAVENOUS
  Filled 2011-08-17 (×4): qty 1
  Filled 2011-08-17: qty 2
  Filled 2011-08-17: qty 1
  Filled 2011-08-17 (×2): qty 2
  Filled 2011-08-17 (×7): qty 1

## 2011-08-17 MED ORDER — BISACODYL 5 MG PO TBEC
10.0000 mg | DELAYED_RELEASE_TABLET | Freq: Every day | ORAL | Status: DC
  Administered 2011-08-18 – 2011-08-20 (×3): 10 mg via ORAL
  Filled 2011-08-17 (×3): qty 2

## 2011-08-17 SURGICAL SUPPLY — 68 items
ADAPTER CARDIO PERF ANTE/RETRO (ADAPTER) ×2 IMPLANT
ATTRACTOMAT 16X20 MAGNETIC DRP (DRAPES) ×2 IMPLANT
BAG DECANTER FOR FLEXI CONT (MISCELLANEOUS) ×2 IMPLANT
BLADE STERNUM SYSTEM 6 (BLADE) ×2 IMPLANT
BLADE SURG 15 STRL LF DISP TIS (BLADE) ×1 IMPLANT
BLADE SURG 15 STRL SS (BLADE) ×2
CANISTER SUCTION 2500CC (MISCELLANEOUS) ×2 IMPLANT
CANNULA GUNDRY RCSP 15FR (MISCELLANEOUS) ×2 IMPLANT
CATH ROBINSON RED A/P 18FR (CATHETERS) ×6 IMPLANT
CATH THORACIC 36FR (CATHETERS) ×2 IMPLANT
CATH THORACIC 36FR RT ANG (CATHETERS) ×2 IMPLANT
CLOTH BEACON ORANGE TIMEOUT ST (SAFETY) ×2 IMPLANT
CONT SPEC 4OZ CLIKSEAL STRL BL (MISCELLANEOUS) ×2 IMPLANT
CONT SPEC STER OR (MISCELLANEOUS) ×2 IMPLANT
COVER SURGICAL LIGHT HANDLE (MISCELLANEOUS) ×4 IMPLANT
CRADLE DONUT ADULT HEAD (MISCELLANEOUS) ×2 IMPLANT
DRAPE SLUSH MACHINE 52X66 (DRAPES) IMPLANT
DRAPE SLUSH/WARMER DISC (DRAPES) ×2 IMPLANT
DRSG COVADERM 4X14 (GAUZE/BANDAGES/DRESSINGS) ×2 IMPLANT
ELECT CAUTERY BLADE 6.4 (BLADE) ×2 IMPLANT
ELECT REM PT RETURN 9FT ADLT (ELECTROSURGICAL) ×4
ELECTRODE REM PT RTRN 9FT ADLT (ELECTROSURGICAL) ×2 IMPLANT
GLOVE BIO SURGEON STRL SZ 6 (GLOVE) IMPLANT
GLOVE BIO SURGEON STRL SZ 6.5 (GLOVE) IMPLANT
GLOVE BIO SURGEON STRL SZ7 (GLOVE) ×4 IMPLANT
GLOVE BIO SURGEON STRL SZ7.5 (GLOVE) ×6 IMPLANT
GLOVE BIOGEL M STRL SZ7.5 (GLOVE) ×8 IMPLANT
GLOVE BIOGEL PI IND STRL 6 (GLOVE) ×2 IMPLANT
GLOVE BIOGEL PI IND STRL 7.0 (GLOVE) ×2 IMPLANT
GLOVE BIOGEL PI INDICATOR 6 (GLOVE) ×2
GLOVE BIOGEL PI INDICATOR 7.0 (GLOVE) ×2
GLOVE ECLIPSE 7.0 STRL STRAW (GLOVE) ×8 IMPLANT
GLOVE EUDERMIC 7 POWDERFREE (GLOVE) ×4 IMPLANT
GOWN PREVENTION PLUS XLARGE (GOWN DISPOSABLE) ×2 IMPLANT
GOWN STRL NON-REIN LRG LVL3 (GOWN DISPOSABLE) ×14 IMPLANT
HEART VENT LT CURVED (MISCELLANEOUS) ×2 IMPLANT
HEMOSTAT POWDER SURGIFOAM 1G (HEMOSTASIS) ×6 IMPLANT
HEMOSTAT SURGICEL 2X14 (HEMOSTASIS) IMPLANT
KIT BASIN OR (CUSTOM PROCEDURE TRAY) ×2 IMPLANT
KIT CATH CPB BARTLE (MISCELLANEOUS) ×2 IMPLANT
KIT ROOM TURNOVER OR (KITS) ×2 IMPLANT
KIT SUCTION CATH 14FR (SUCTIONS) ×2 IMPLANT
NS IRRIG 1000ML POUR BTL (IV SOLUTION) ×16 IMPLANT
PACK OPEN HEART (CUSTOM PROCEDURE TRAY) ×2 IMPLANT
PAD ARMBOARD 7.5X6 YLW CONV (MISCELLANEOUS) ×4 IMPLANT
SPONGE GAUZE 4X4 12PLY (GAUZE/BANDAGES/DRESSINGS) ×2 IMPLANT
SUT BONE WAX W31G (SUTURE) ×2 IMPLANT
SUT ETHIBON 2 0 V 52N 30 (SUTURE) ×6 IMPLANT
SUT ETHIBOND 2 0 SH (SUTURE) ×1
SUT ETHIBOND 2 0 SH 36X2 (SUTURE) ×1 IMPLANT
SUT PROLENE 3 0 SH 1 (SUTURE) ×2 IMPLANT
SUT PROLENE 3 0 SH DA (SUTURE) IMPLANT
SUT PROLENE 4 0 RB 1 (SUTURE) ×3
SUT PROLENE 4-0 RB1 .5 CRCL 36 (SUTURE) ×3 IMPLANT
SUT STEEL 6MS V (SUTURE) IMPLANT
SUT STEEL SZ 6 DBL 3X14 BALL (SUTURE) ×6 IMPLANT
SUT VIC AB 1 CTX 36 (SUTURE) ×4
SUT VIC AB 1 CTX36XBRD ANBCTR (SUTURE) ×2 IMPLANT
SUTURE E-PAK OPEN HEART (SUTURE) ×2 IMPLANT
SYSTEM SAHARA CHEST DRAIN ATS (WOUND CARE) ×2 IMPLANT
TAPE CLOTH SURG 4X10 WHT LF (GAUZE/BANDAGES/DRESSINGS) ×2 IMPLANT
TOWEL OR 17X24 6PK STRL BLUE (TOWEL DISPOSABLE) ×6 IMPLANT
TOWEL OR 17X26 10 PK STRL BLUE (TOWEL DISPOSABLE) ×8 IMPLANT
TRAY FOLEY IC TEMP SENS 14FR (CATHETERS) ×2 IMPLANT
TUBE SUCT INTRACARD DLP 20F (MISCELLANEOUS) ×2 IMPLANT
UNDERPAD 30X30 INCONTINENT (UNDERPADS AND DIAPERS) ×2 IMPLANT
VALVE MAGNA EASE 21MM (Prosthesis & Implant Heart) ×2 IMPLANT
WATER STERILE IRR 1000ML POUR (IV SOLUTION) ×4 IMPLANT

## 2011-08-17 NOTE — Progress Notes (Signed)
Sample sent to lab and done on iSTAT for chem 8 was hemolyzed drawn through the arterial catheter. Sample redrawn from another site and sent down.

## 2011-08-17 NOTE — Progress Notes (Signed)
[facility] BRIEF SICU PROGRESS NOTE  Day of Surgery  S/P Procedure(s) (LRB): AORTIC VALVE REPLACEMENT (AVR) (N/A)   Extubated uneventfully AV paced, BP stable, C.I. > 2.0 Chest tube output low UOP 50-100 mL/hr  Plan: Continue routine early postop  Renald Haithcock H 08/17/2011 7:44 PM

## 2011-08-17 NOTE — Op Note (Signed)
NAME:  Fernando Moyer, Fernando Moyer NO.:  [id number]  MEDICAL RECORD NO.:  [id number]  LOCATION:  2315                         FACILITY:  [facility]  PHYSICIAN:  Evelene Croon, M.D.     DATE OF BIRTH:  [birth year]  DATE OF PROCEDURE:  08/17/2011 DATE OF DISCHARGE:                              OPERATIVE REPORT   PREOPERATIVE DIAGNOSIS:  Severe aortic stenosis.  POSTOPERATIVE DIAGNOSIS:  Severe aortic stenosis.  OPERATIVE PROCEDURE:  Median sternotomy, extracorporeal circulation, aortic valve replacement using a 21-mm Edwards Pericardial Magna-Ease valve.  ATTENDING SURGEON:  Evelene Croon, M.D.  ASSISTANT:  Coral Ceo, PA-C.  ANESTHESIA:  General endotracheal.  CLINICAL HISTORY:  This patient is a 78 year old gentleman with a history of carotid artery disease, right iliac artery occlusion, who has a history of aortic stenosis, followed by echocardiogram.  He was admitted to the hospital in November 2012 with chest pain and found to be in atrial fibrillation with a rapid ventricular response.  He was converted with on Cardizem and subsequently stayed on a beta-blocker and Pradaxa.  Repeat echocardiogram showed an increase in the mean gradient to 39 with a moderate-to-severe degree of aortic stenosis.  He underwent cardiac catheterizations showing moderately severe aortic stenosis with a calculated aortic valve area 0.9 cm square with nonobstructive coronary disease.  A repeat echocardiogram in March 2013 showed progression of his aortic stenosis to severe with an increase in his mean gradient of 50.  He had dyspnea on exertion, although somewhat limited due to his lower extremity claudication and degenerative lower spine disease.  Pulmonary function testing showed only mild obstructive disease and a moderately reduced diffusion capacity of about 62% of predicted.  It was felt that his symptoms were likely due to progressive aortic stenosis and that aortic valve replacement  was indicated.  After review of the patient, I agreed.  We discussed the pros and cons of mechanical and tissue valves, and my recommendation for a tissue valve given his age and comorbid factors.  He is in agreement with that.  I discussed the operative procedure with the patient and his 2 granddaughters including alternatives, benefits, and risks including, but not limited to, bleeding, blood transfusion, infection, stroke, myocardial infarction, heart block, requiring permanent pacemaker, organ dysfunction, and death.  He understood and agreed to proceed.  OPERATIVE PROCEDURE:  The patient was taken to operating room and placed on table in supine position.  After induction of general endotracheal anesthesia, a Foley catheter was placed in bladder using sterile technique.  Then, the chest, abdomen, and both lower extremities were prepped and draped in usual sterile manner.  TEE was performed by Anesthesiology and showed severe calcific aortic stenosis.  Then the chest, abdomen, and both lower extremities were prepped and draped in usual sterile manner.  The chest was entered through a median sternotomy incision and the pericardium opened in midline.  Examination of the heart showed good ventricular contractility.  The ascending aorta was of normal size.  There was minimal plaque posteriorly up in the aortic arch and there was some calcified plaque in the aortic root just above the aortic valve level.  Then, the patient was heparinized when an adequate ACT  was obtained. The distal ascending aorta was cannulated using a 20-French aortic cannula for arterial inflow.  Venous outflow was achieved using a two- stage venous cannula through the right atrial appendage.  An antegrade cardioplegia and vent cannula was inserted in the aortic root.  A retrograde cardioplegic cannula was inserted through a pursestring suture in the right atrium and advanced in the coronary sinus.  A  left ventricular vent was placed through right superior pulmonary vein.  The patient was placed on cardiopulmonary bypass.  Then the aorta was crossclamped and 700 mL of cold blood antegrade cardioplegia was administered in the aortic root with quick arrest of the heart. Systemic hypothermia to 32 degrees centigrade and topical hypothermic iced saline was used.  Temperature probe was placed in septum insulating pad in the pericardium.  Additional doses of cold blood retrograde cardioplegia were given at about 20 minutes intervals to maintain myocardial temperature less than 10 degrees centigrade.  Then, the aorta was opened transversely about 1 cm above the sinotubular junction.  Examination of the native valve showed that there were 3 leaflets that were heavily calcified and immobile.  There was some moderate aortic annular calcification and severe mitral annular calcification.  The annular calcification extended up into the wall of the aortic sinuses.  Then the native valve was excised.  The anulus was decalcified with rongeurs.  Care was taken to remove all particulate debris.  The left ventricle and aortic root were copiously irrigated with iced saline solution, and directly inspected for any debris and none was seen.  The anulus was sized and a 21-mm Edwards pericardial Magna-Ease valve was chosen.  This had model number [id number] and serial number [id number].  Then, a series of pledgeted 2-0 Ethibond horizontal mattress sutures were placed around the aortic anulus with pledgets in a subannular position. The sutures were placed through the sewing ring and the valve lowered into place.  Sutures were tied sequentially.  The valve seated nicely. The right and left coronary ostia were directly inspected and were not obstructed.  Then, the patient was rewarmed to 37 degrees centigrade. The aortotomy was closed in 2 layers using continuous 4-0 Prolene suture with felt-strips to reinforce the  closure.  Then, the head was placed in Trendelenburg position and the left side of the heart de-aired.  We did use CO2 insufflation into the pericardial cavity throughout the procedure to minimize intracardiac air.  After de-airing maneuvers, the crossclamp was removed with a time of 79 minutes.  There was spontaneous return of complete heart block rhythm.  The aortotomy appeared hemostatic.  Then, 2 temporary right ventricular and right atrial pacing wires placed and brought out through the skin.  The patient was paced in DOO load at 80.  After further de-airing maneuvers, the patient was weaned from cardiopulmonary bypass on no inotropic agents.  Total bypass time was 105 minutes.  Cardiac function appeared good with cardiac output of 4 L per minute.  TEE showed normal functioning aortic valve prosthesis with no evidence of perivalvular leak or central regurgitation.  Left ventricular function appeared well preserved. There was no mitral regurgitation.  Then protamine was given, and the venous and aortic cannulas were removed without difficulty.  Hemostasis was achieved.  Two chest tubes were placed with the tube in the posterior pericardium and one in anterior mediastinum.  The sternum was then closed with double #6 stainless steel wires.  The fascia was closed with continuous #1 Vicryl suture.  Subcutaneous tissue was closed  with continuous 2-0 Vicryl and the skin with a 3-0 Vicryl subcuticular closure.  The sponge, needle, instrument counts were correct according to the scrub nurse.  Dry sterile dressing were applied over the incisions and around the chest tubes, which were hooked to Pleur-Evac suction.  The patient remained hemodynamically stable and transferred to the SICU in guarded, but stable condition.     Evelene Croon, M.D.     BB/MEDQ  D:  08/17/2011  T:  08/17/2011  Job:  [id number]

## 2011-08-17 NOTE — Anesthesia Postprocedure Evaluation (Signed)
  Anesthesia Post-op Note  Patient: Fernando Moyer  Procedure(s) Performed: Procedure(s) (LRB): AORTIC VALVE REPLACEMENT (AVR) (N/A)  Patient Location: PACU and SICU  Anesthesia Type: General  Level of Consciousness: Patient remains intubated per anesthesia plan  Airway and Oxygen Therapy: Patient remains intubated per anesthesia plan  Post-op Pain: mild  Post-op Assessment: Post-op Vital signs reviewed  Post-op Vital Signs: Reviewed  Complications: No apparent anesthesia complications

## 2011-08-17 NOTE — Progress Notes (Signed)
Utilization Review Completed.Fernando Moyer T5/17/2013   

## 2011-08-17 NOTE — Preoperative (Addendum)
Beta Blockers   Reason not to administer Beta Blockers:Not Applicable 

## 2011-08-17 NOTE — OR Nursing (Signed)
Call placed to family at removal of retractor at 11:04. Call made to Unit 2300 at removal of retractor at 11:04 , skin suture at 11:30 and patient exit.

## 2011-08-17 NOTE — Interval H&P Note (Signed)
History and Physical Interval Note:  08/17/2011 7:28 AM  Fernando Moyer  has presented today for surgery, with the diagnosis of aortic stenosis  The various methods of treatment have been discussed with the patient and family. After consideration of risks, benefits and other options for treatment, the patient has consented to  Procedure(s) (LRB): AORTIC VALVE REPLACEMENT (AVR) (N/A) as a surgical intervention .  The patients' history has been reviewed, patient examined, no change in status, stable for surgery.  I have reviewed the patients' chart and labs.  Questions were answered to the patient's satisfaction.     Alleen Borne

## 2011-08-17 NOTE — Transfer of Care (Signed)
Immediate Anesthesia Transfer of Care Note  Patient: Fernando Moyer  Procedure(s) Performed: Procedure(s) (LRB): AORTIC VALVE REPLACEMENT (AVR) (N/A)  Patient Location: SICU  Anesthesia Type: General  Level of Consciousness: sedated, unresponsive and Patient remains intubated per anesthesia plan  Airway & Oxygen Therapy: Patient remains intubated per anesthesia plan and Patient placed on Ventilator (see vital sign flow sheet for setting)  Post-op Assessment: Report given to PACU RN and Post -op Vital signs reviewed and stable  Post vital signs: Reviewed and stable  Complications: No apparent anesthesia complications

## 2011-08-17 NOTE — Anesthesia Procedure Notes (Signed)
Procedures TEE Placement 0800: 2D TEE probe initially  placed down oropharyx without difficulty but poor quality.   Subsequently replacementTEE  probe found and replaced but technically poor quality as well. Color function not working. Dr. Noreene Larsson trouble shooting as well as Lead anesthesia tech Marval Regal. CE

## 2011-08-17 NOTE — Plan of Care (Signed)
Problem: Phase II Progression Outcomes Goal: Patient extubated within - Outcome: Completed/Met Date Met:  08/17/11 Patient extubated within 6 hour window.

## 2011-08-17 NOTE — H&P (Signed)
[address]            [name] [address]          [phone]     PCP is Willow Ora, MD, MD  Referring Provider is Tonny Bollman, MD  Chief Complaint   Patient presents with   .  Aortic Stenosis     Referral from Dr Excell Seltzer for eval AS, 2D Echo 06/26/11   HPI:  The patient is a 78 year old gentleman with a history of carotid artery disease and right iliac artery occlusion who had a history of moderate aortic stenosis by echocardiogram in April 2012 with a mean gradient of 35 at that time. He was admitted to the hospital in November of 2012 with chest pain and was found to be in atrial fibrillation with rapid ventricular response. He converted on Cardizem and was subsequently started on a beta blocker and Pradaxa. A repeat echocardiogram showed an increase in the mean gradient to 39 mm mercury with moderate to severe aortic stenosis. He underwent cardiac catheterization showing moderately severe aortic stenosis with a calculated valve area of 0.9 cm with nonobstructive coronary disease. He had a repeat echocardiogram in March of 2013 showing progression of his aortic stenosis to severe with and increase in his mean gradient to 50 mm mercury. He has had dyspnea with exertion, although he has been somewhat limited due to lower extremity claudication and degenerative lower spine disease. He had pulmonary function testing performed which showed mild obstructive airway disease and moderately reduced diffusion capacity of 62% of predicted.  Past Medical History   Diagnosis  Date   .  GERD with stricture      w/hx of stricture   .  PVD (peripheral vascular disease)  12/07     per cath 12/07....iliac   .  Carotid artery occlusion      last u/s 3-11...Marland Kitchenper vasc.surgery 2007 cath: nonobstructive CAD. mil;d MS, trivial AoS, small AAA,   .  Bowen's disease      right arm BX: referred to dermatology   .  Hyperlipidemia    .  Emphysema      PFT 4/10 FVC 3.45 (90%),  FEV1 2.72 (106%), TLC 5.36 (93%), DLCO 74%, NO BD RESPONSE: ? OF PSEUDONORMALIZATION OF PFT FINDINGS W/BOTH OBSTRUCTIVE AND RESTRICTIVE DEFECTS.   Marland Kitchen  AAA (abdominal aortic aneurysm)  05/2008     PER CARDIAC CATH   .  Aortic stenosis  05/2008     PER CARDIAC CATH, MILD, MITRAL STENOSIS   .  Mitral stenosis  05/2008     PER CARDIAC CATH   .  Coronary artery disease  05/2008     MILD, MEDICAL MANAGEMENT   .  Pulmonary hypertension    .  COPD (chronic obstructive pulmonary disease)    .  Hypoxemia    .  Hyperlipidemia    .  Hypertension  02/21/11     pt denies this hx   .  Heart murmur    .  GERD (gastroesophageal reflux disease)    .  Dyspnea      w/u included a cath and PFT's, sxms thought to be pulmonary   .  Chronic back pain    .  Peripheral neuropathy      right thigh; "it's been that way for years"   .  Myocardial infarction     Past Surgical  History   Procedure  Date   .  Appendectomy    .  Esophageal dilation    .  Cardiac catheterization  02/27/06     DR. DOWNEY: CORONARY ANGIOGRAPHY, ABDOMINAL AORTOGRAPHY   .  Endarterectomy  08-2010     Right carotid endarterectomy   .  Carotid endarterectomy  08/2010    Family History   Problem  Relation  Age of Onset   .  Heart attack  Father  60      MI    .  Diabetes  Neg Hx    .  Colon cancer  Neg Hx    .  Prostate cancer  Neg Hx    Social History  History   Substance Use Topics   .  Smoking status:  Former Smoker -- 2.0 packs/day for 30 years     Types:  Cigarettes     Quit date:  04/02/1986   .  Smokeless tobacco:  Never Used     Comment: quit smoking cigarettes 04/1987    .  Alcohol Use:  No    Current Outpatient Prescriptions   Medication  Sig  Dispense  Refill   .  omeprazole (PRILOSEC) 20 MG capsule  Take 20 mg by mouth daily.     .  clindamycin (CLINDAGEL) 1 % gel  Apply 1 application topically 2 (two) times daily.     .  dabigatran (PRADAXA) 150 MG CAPS  Take 150 mg by mouth every 12 (twelve) hours.     .   metoprolol tartrate (LOPRESSOR) 25 MG tablet  Take 25 mg by mouth 2 (two) times daily.     .  pravastatin (PRAVACHOL) 40 MG tablet  Take 40 mg by mouth every evening.     No Known Allergies  Review of Systems  Constitutional: Positive for fatigue. Negative for fever, chills, activity change, appetite change and unexpected weight change.  HENT: Negative.  Sees a dentist regularly.  Eyes: Negative.  Respiratory: Positive for shortness of breath.  Cardiovascular: Negative for chest pain, palpitations and leg swelling.  Right calf claudication  Gastrointestinal: Negative.  Genitourinary: Negative.  Musculoskeletal: Positive for back pain.  Neurological: Negative.  Hematological: Negative.  Psychiatric/Behavioral: Negative.  BP 131/68  Pulse 92  Resp 20  Ht 5\' 7"  (1.702 m)  Wt 216 lb (97.977 kg)  BMI 33.83 kg/m2  SpO2 97%  Physical Exam  Constitutional: He is oriented to person, place, and time. He appears well-developed and well-nourished.  Looks uncomfortable. Just had spinal injection by Dr. Ethelene Hal.  HENT:  Head: Normocephalic and atraumatic.  Mouth/Throat: Oropharynx is clear and moist.  Teeth in fair condition  Eyes: Conjunctivae and EOM are normal. Pupils are equal, round, and reactive to light.  Neck: No JVD present. No thyromegaly present.  Cardiovascular: Normal rate and regular rhythm. Exam reveals no gallop and no friction rub.  Murmur heard. III/VI SEM  Pulmonary/Chest: Effort normal and breath sounds normal. No respiratory distress.  Abdominal: Soft. Bowel sounds are normal. He exhibits no distension and no mass. There is no tenderness.  Musculoskeletal: He exhibits no edema and no tenderness.  Lymphadenopathy:  He has no cervical adenopathy.  Neurological: He is alert and oriented to person, place, and time. He has normal strength. No cranial nerve deficit or sensory deficit.  Skin: Skin is warm and dry.  Psychiatric: He has a normal mood and affect.  Diagnostic  Tests:  Procedural Findings:  Hemodynamics  RA 8  RV 41/12  PA 47/12 with a mean of 23  PCWP 17  LV 145/12  AO 117/50 with a mean of 78  Oxygen saturations:  PA 66%  AO 94%  Cardiac Output (Fick) 5.0 L per minute  Cardiac Index (Fick) 2.4 L per minute per meter square  Aortic valve mean gradient 28 mm mercury, aortic valve area 0.9 cm    Coronary angiography:  Coronary dominance: right  Left mainstem: patent without significant stenosis  Left anterior descending (LAD): the 50% mid stenosis after the first diagonal branch, there is no high grade obstructive disease present.  Left circumflex (LCx): left circumflex is patent without significant stenosis. There are 2 obtuse marginal branches without significant stenosis.  Right coronary artery (RCA): there are scattered 30-40% stenoses in the proximal, mid, and distal right coronary artery. The vessel is dominant with a PDA branch without significant stenosis.  Left ventriculography: Left ventricular systolic function is normal, LVEF is estimated at 55-65%, there is no significant mitral regurgitation. There is heavy mitral annular calcification, there is heavy aortic valve calcification.  Final Conclusions:  1. Mild diffuse nonobstructive coronary artery disease as above  2. Severe aortic stenosis with an aortic valve area of 0.9 cm  3. Normal left ventricular systolic function  Recommendations:  The patient appears to have had a small non-ST elevation infarction related to demand ischemia from rapid atrial fibrillation. This is in the setting of moderately severe aortic stenosis. Recommend initiation of anticoagulation. Will use Pradaxa 150 mg twice daily. I have reviewed the risks, potential benefit, and alternatives to anticoagulation. I have also discussed the positives and negatives of the direct thrombin inhibitor versus warfarin and the patient prefers Pradaxa. His aortic stenosis has progressed and we will have a discussion about  the consideration of aortic valve surgery. He will likely be discharged in the morning.  Tonny Bollman  02/23/2011, 5:20 PM   ------------------------------------------------------------ Transthoracic Echocardiography  Patient: Braylon, Grenda MR #: [id number] Study Date: 06/26/2011 Gender: M Age: 73 Height: 170.2cm Weight: 98.9kg BSA: 2.38m^2 Pt. Status: Room:  ATTENDING Olga Millers, MD, [facility] ORDERING Tonny Bollman, MD REFERRING Tonny Bollman, MD SONOGRAPHER Aida Raider, RDCS PERFORMING [facility], [facility] cc:  ------------------------------------------------------------ LV EF: 60% - 65%  ------------------------------------------------------------ Indications: 424.1 Aortic valve disorders.  ------------------------------------------------------------ History: PMH: Acquired from the patient and from the patient's chart. PMH: NSTEMI. Atrial Fibrillation. CAD. Pulmonary hypertension. AAA. Peripheral vascular disease. Peripheral Neuropathy. Chest pain. Dizziness. Emphysema. Carotid Artery disease. Murmur. Dyspnea. Chronic back pain. Risk factors: Hypertension. Dyslipidemia.  ------------------------------------------------------------ Study Conclusions  - Left ventricle: The cavity size was normal. Wall thickness was increased in a pattern of mild LVH. There was mild focal basal hypertrophy of the septum. Systolic function was normal. The estimated ejection fraction was in the range of 60% to 65%. Wall motion was normal; there were no regional wall motion abnormalities. Doppler parameters are consistent with abnormal left ventricular relaxation (grade 1 diastolic dysfunction). Doppler parameters are consistent with high ventricular filling pressure. - Aortic valve: Valve mobility was restricted. There was severe stenosis. - Mitral valve: Calcified annulus. Mild regurgitation. - Left atrium: The atrium was moderately dilated. - Pulmonary arteries: Systolic  pressure was moderately increased. PA peak pressure: 46mm Hg (S). Impressions:  - Severe AS (mean gradient of 50 mmHg).  ------------------------------------------------------------ Labs, prior tests, procedures, and surgery: Cardiac Cath- March 2010. Transthoracic echocardiography. M-mode, complete 2D, spectral Doppler, and color Doppler. Height: Height: 170.2cm. Height: 67in. Weight: Weight: 98.9kg. Weight: 217.5lb. Body mass index: BMI:  34.1kg/m^2. Body surface area: BSA: 2.47m^2. Blood pressure: 108/73. Patient status: Outpatient. Location: [place] [facility]  ------------------------------------------------------------  ------------------------------------------------------------ Left ventricle: The cavity size was normal. Wall thickness was increased in a pattern of mild LVH. There was mild focal basal hypertrophy of the septum. Systolic function was normal. The estimated ejection fraction was in the range of 60% to 65%. Wall motion was normal; there were no regional wall motion abnormalities. Doppler parameters are consistent with abnormal left ventricular relaxation (grade 1 diastolic dysfunction). Doppler parameters are consistent with high ventricular filling pressure.  ------------------------------------------------------------ Aortic valve: Trileaflet; moderately calcified leaflets. Valve mobility was restricted. Doppler: There was severe stenosis. No regurgitation. VTI ratio of LVOT to aortic valve: 0.26. Indexed valve area: 0.39cm^2/m^2 (VTI). Peak velocity ratio of LVOT to aortic valve: 0.31. Indexed valve area: 0.47cm^2/m^2 (Vmax).  ------------------------------------------------------------ Aorta: Aortic root: The aortic root was normal in size.  ------------------------------------------------------------ Mitral valve: Calcified annulus. Mobility was not restricted. Doppler: Mild regurgitation. Indexed valve area by pressure half-time: 1.03cm^2/m^2.  Indexed valve area by continuity equation (using LVOT flow): 0.94cm^2/m^2. Mean gradient: 5mm Hg (D). Peak gradient: 9mm Hg (D).  ------------------------------------------------------------ Left atrium: The atrium was moderately dilated.  ------------------------------------------------------------ Right ventricle: The cavity size was normal. Systolic function was normal.  ------------------------------------------------------------ Pulmonic valve: Doppler: Transvalvular velocity was within the normal range. There was no evidence for stenosis.  ------------------------------------------------------------ Tricuspid valve: Structurally normal valve. Doppler: Transvalvular velocity was within the normal range. Mild regurgitation.  ------------------------------------------------------------ Pulmonary artery: Systolic pressure was moderately increased.  ------------------------------------------------------------ Right atrium: The atrium was normal in size.  ------------------------------------------------------------ Pericardium: There was no pericardial effusion.  ------------------------------------------------------------  2D measurements Normal Doppler measurements Normal Left ventricle Main pulmonary LVID ED, 38.8 mm 43-52 artery chord, Pressure, 46 mm Hg =30 PLAX S LVID ES, 27.9 mm 23-38 Left ventricle chord, Ea, lat 5.48 cm/s ------ PLAX ann, tiss FS, chord, 28 % >29 DP PLAX E/Ea, lat 26.6 ------ LVPW, ED 12.2 mm ------ ann, tiss 4 IVS/LVPW 1.26 <1.3 DP ratio, ED Ea, med 6.47 cm/s ------ Ventricular septum ann, tiss IVS, ED 15.4 mm ------ DP LVOT E/Ea, med 22.5 ------ Diam, S 20 mm ------ ann, tiss 7 Area 3.14 cm^2 ------ DP Diam 20 mm ------ LVOT Aorta Peak vel, 133 cm/s ------ Root diam, 31 mm ------ S ED VTI, S 22.6 cm ------ Left atrium Peak 7 mm Hg ------ AP dim 51 mm ------ gradient, AP dim 2.43 cm/m^2 <2.2 S index Stroke vol 71 ml ------ Stroke  33.8 ml/m^2 ------ index Aortic valve Peak vel, 426 cm/s ------ S Mean vel, 336 cm/s ------ S VTI, S 86 cm ------ VTI ratio 0.26 ------ LVOT/AV Area index 0.39 cm^2/m ------ (VTI) ^2 Peak vel 0.31 ------ ratio, LVOT/AV Area index 0.47 cm^2/m ------ (Vmax) ^2 Mitral valve Peak E vel 146 cm/s ------ Peak A vel 154 cm/s ------ Mean vel, 108 cm/s ------ D Decelerati 215 ms 150-23 on time 0 Pressure 102 ms ------ half-time Mean 5 mm Hg ------ gradient, D Peak 9 mm Hg ------ gradient, D Peak E/A 0.9 ------ ratio Area index 1.03 cm^2/m ------ (PHT) ^2 Area index 0.94 cm^2/m ------ (LVOT ^2 cont) Annulus 35.9 cm ------ VTI Tricuspid valve Regurg 322 cm/s ------ peak vel Peak RV-RA 41 mm Hg ------ gradient, S Systemic veins Estimated 5 mm Hg ------ CVP Right ventricle Pressure, 46 mm Hg <30 S Sa vel, 16.2 cm/s ------ lat ann, tiss DP  ------------------------------------------------------------ Prepared and Electronically Authenticated by  Olga Millers,  MD, [facility] 2013-03-26T15:12:[id number]     Impression:  The patient has severe aortic stenosis with dyspnea on exertion and occasional dizziness. He has nonobstructive coronary disease involving the LAD and right coronary artery. I agree that it is best to proceed with aortic valve replacement before he has progressive left ventricular dysfunction. I discussed the pros and cons of mechanical and tissue valves and my recommendation for using a tissue valve given his age and comorbid factors. He is in agreement with using a tissue valve. I discussed the operative procedure with the patient and family including alternatives, benefits and risks; including but not limited to bleeding, blood transfusion, infection, stroke, myocardial infarction, graft failure, heart block requiring a permanent pacemaker, organ dysfunction, and death. Rolla Plate understands and agrees to proceed.   Plan:  Aortic Valve  Replacement

## 2011-08-17 NOTE — Brief Op Note (Signed)
08/17/2011  10:41 AM  PATIENT:  Fernando Moyer  78 y.o. male  PRE-OPERATIVE DIAGNOSIS:  aortic stenosis  POST-OPERATIVE DIAGNOSIS:  aortic stenosis  PROCEDURE:  Procedure(s): AORTIC VALVE REPLACEMENT (AVR) (21 mm Magna Ease pericardial tissue valve)  SURGEON:  Surgeon(s): Alleen Borne, MD  ASSISTANT: Coral Ceo, PA-C  ANESTHESIA:   general  PATIENT CONDITION:  ICU - intubated and hemodynamically stable.  PRE-OPERATIVE WEIGHT: 96 kg

## 2011-08-17 NOTE — Progress Notes (Signed)
Pulse 55 this a.m., Metoprolol not taken at home, will hold here in [facility]

## 2011-08-17 NOTE — Anesthesia Preprocedure Evaluation (Addendum)
Anesthesia Evaluation  Patient identified by MRN, date of birth, ID band Patient awake    Reviewed: Allergy & Precautions, H&P , NPO status , Patient's Chart, lab work & pertinent test results, reviewed documented beta blocker date and time   Airway Mallampati: II TM Distance: >3 FB Neck ROM: Full    Dental  (+) Partial Upper, Implants, Missing and Dental Advisory Given,    Pulmonary shortness of breath and with exertion, COPDformer smoker         Cardiovascular hypertension, Pt. on home beta blockers + CAD and + Past MI + dysrhythmias Atrial Fibrillation + Valvular Problems/Murmurs AS Rhythm:Regular Rate:Normal + Systolic murmurs MI Nov 2012; right CEA 2010 [place]; dopplers show bilateral 40-59% ICA stenosis.  ECHO 06/26/11 EF 60-65%. Normal LV function. Severe AS. Mild MR. Elevated pulmonary pressures.   Neuro/Psych  Headaches, Rt CEA    GI/Hepatic Neg liver ROS, GERD-  Medicated and Controlled,  Endo/Other  negative endocrine ROS  Renal/GU negative Renal ROS     Musculoskeletal  (+) Arthritis -, Osteoarthritis,    Abdominal (+) + obese,   Peds  Hematology negative hematology ROS (+)   Anesthesia Other Findings   Reproductive/Obstetrics                      Anesthesia Physical Anesthesia Plan  ASA: IV  Anesthesia Plan: General   Post-op Pain Management:    Induction: Intravenous  Airway Management Planned: Oral ETT  Additional Equipment: Arterial line, PA Cath and TEE  Intra-op Plan:   Post-operative Plan: Post-operative intubation/ventilation  Informed Consent:   Plan Discussed with: CRNA  Anesthesia Plan Comments:         Anesthesia Quick Evaluation

## 2011-08-17 NOTE — Progress Notes (Signed)
  Echocardiogram Echocardiogram Transesophageal has been performed.  Fernando Moyer 08/17/2011, 9:14 AM

## 2011-08-18 ENCOUNTER — Inpatient Hospital Stay (HOSPITAL_COMMUNITY): Payer: Medicare Other

## 2011-08-18 LAB — POCT I-STAT, CHEM 8
BUN: 14 mg/dL (ref 6–23)
Calcium, Ion: 1.16 mmol/L (ref 1.12–1.32)
Chloride: 102 meq/L (ref 96–112)
Creatinine, Ser: 1.1 mg/dL (ref 0.50–1.35)
Glucose, Bld: 149 mg/dL — ABNORMAL HIGH (ref 70–99)
HCT: 33 % — ABNORMAL LOW (ref 39.0–52.0)
Hemoglobin: 11.2 g/dL — ABNORMAL LOW (ref 13.0–17.0)
Potassium: 4.5 meq/L (ref 3.5–5.1)
Sodium: 134 meq/L — ABNORMAL LOW (ref 135–145)
TCO2: 24 mmol/L (ref 0–100)

## 2011-08-18 LAB — CBC
HCT: 32.7 % — ABNORMAL LOW (ref 39.0–52.0)
Hemoglobin: 10.9 g/dL — ABNORMAL LOW (ref 13.0–17.0)
MCH: 29.4 pg (ref 26.0–34.0)
MCH: 29.1 pg (ref 26.0–34.0)
Platelets: 149 10*3/uL — ABNORMAL LOW (ref 150–400)
RBC: 3.95 MIL/uL — ABNORMAL LOW (ref 4.22–5.81)
RBC: 3.75 MIL/uL — ABNORMAL LOW (ref 4.22–5.81)
RDW: 14.8 % (ref 11.5–15.5)
WBC: 13.6 10*3/uL — ABNORMAL HIGH (ref 4.0–10.5)

## 2011-08-18 LAB — MAGNESIUM: Magnesium: 2.6 mg/dL — ABNORMAL HIGH (ref 1.5–2.5)

## 2011-08-18 LAB — GLUCOSE, CAPILLARY
Glucose-Capillary: 99 mg/dL (ref 70–99)
Glucose-Capillary: 102 mg/dL — ABNORMAL HIGH (ref 70–99)
Glucose-Capillary: 93 mg/dL (ref 70–99)
Glucose-Capillary: 130 mg/dL — ABNORMAL HIGH (ref 70–99)
Glucose-Capillary: 142 mg/dL — ABNORMAL HIGH (ref 70–99)
Glucose-Capillary: 117 mg/dL — ABNORMAL HIGH (ref 70–99)

## 2011-08-18 LAB — BASIC METABOLIC PANEL
CO2: 24 mEq/L (ref 19–32)
Calcium: 8.2 mg/dL — ABNORMAL LOW (ref 8.4–10.5)
Chloride: 105 mEq/L (ref 96–112)
GFR calc Af Amer: >90 mL/min (ref >90)
Sodium: 136 mEq/L (ref 135–145)

## 2011-08-18 LAB — CREATININE, SERUM: Creatinine, Ser: 0.96 mg/dL (ref 0.50–1.35)

## 2011-08-18 MED ORDER — FUROSEMIDE 10 MG/ML IJ SOLN
20.0000 mg | Freq: Four times a day (QID) | INTRAMUSCULAR | Status: AC
  Administered 2011-08-18 (×2): 20 mg via INTRAVENOUS
  Filled 2011-08-18 (×3): qty 2

## 2011-08-18 MED ORDER — INSULIN ASPART 100 UNIT/ML ~~LOC~~ SOLN: 0.0000 [IU] | SUBCUTANEOUS | Status: DC

## 2011-08-18 NOTE — Plan of Care (Signed)
Problem: Phase I Progression Outcomes Goal: Patient status is OR emergent or Short Stay Outcome: Completed/Met Date Met:  08/18/11 Short stay

## 2011-08-18 NOTE — Progress Notes (Signed)
   CARDIOTHORACIC SURGERY PROGRESS NOTE   R1 Day Post-Op Procedure(s) (LRB): AORTIC VALVE REPLACEMENT (AVR) (N/A)  Subjective: Feels sore in chest but o/w okay  Objective: Vital signs: BP Readings from Last 1 Encounters:  08/18/11 114/54   Pulse Readings from Last 1 Encounters:  08/18/11 79   Resp Readings from Last 1 Encounters:  08/18/11 18   Temp Readings from Last 1 Encounters:  08/18/11 98 F (36.7 C) Oral    Hemodynamics: PAP: (27-66)/(14-32) 52/22 mmHg CO:  [3.8 L/min-5.1 L/min] 4 L/min CI:  [1.7 L/min/m2-2.3 L/min/m2] 1.9 L/min/m2  Physical Exam:  Rhythm:   Complete heart block, DDD paced  Breath sounds: Few wheezes  Heart sounds:  RRR  Incisions:  Dressings dry  Abdomen:  soft  Extremities:  warm   Intake/Output from previous day: 05/17 0701 - 05/18 0700 In: 6829.1 [P.O.:600; I.V.:3670.1; Blood:534; IV Piggyback:2025] Out: 3810 [Urine:2190; Blood:1200; Chest Tube:420] Intake/Output this shift: Total I/O In: 175 [P.O.:120; I.V.:55] Out: 30 [Urine:30]  Lab Results:  Basename 08/18/11 0352 08/17/11 1853 08/17/11 1829  WBC 13.6* -- 11.6*  HGB 11.6* 11.9* --  HCT 34.8* 35.0* --  PLT 149* -- 129*   BMET:  Basename 08/18/11 0352 08/17/11 1925 08/17/11 1853 08/15/11 1038  NA 136 -- 139 --  K 4.9 4.7 -- --  CL 105 -- 109 --  CO2 24 -- -- 27  GLUCOSE 137* -- 129* --  BUN 12 -- 11 --  CREATININE 0.95 -- 0.80 --  CALCIUM 8.2* -- -- 9.6    CBG (last 3)   Basename 08/18/11 0733 08/18/11 0412 08/17/11 2315  GLUCAP 130* 132* 140*   ABG    Component Value Date/Time   PHART 7.332* 08/17/2011 1857   HCO3 23.0 08/17/2011 1857   TCO2 24 08/17/2011 1857   ACIDBASEDEF 3.0* 08/17/2011 1857   O2SAT 93.0 08/17/2011 1857   CXR: okay  Assessment/Plan: S/P Procedure(s) (LRB): AORTIC VALVE REPLACEMENT (AVR) (N/A)  Doing well POD1 Complete heart block Expected post op acute blood loss anemia, mild, stable Expected post op volume excess,  mild   Mobilize  D/C tubes and lines  D/C beta blocker  Start diuresis   Fernando Moyer 08/18/2011 8:57 AM

## 2011-08-19 ENCOUNTER — Inpatient Hospital Stay (HOSPITAL_COMMUNITY): Payer: Medicare Other

## 2011-08-19 LAB — CBC
MCH: 28.9 pg (ref 26.0–34.0)
MCHC: 33.2 g/dL (ref 30.0–36.0)
MCV: 87 fL (ref 78.0–100.0)
Platelets: 110 10*3/uL — ABNORMAL LOW (ref 150–400)
RBC: 3.7 MIL/uL — ABNORMAL LOW (ref 4.22–5.81)

## 2011-08-19 LAB — BASIC METABOLIC PANEL
BUN: 14 mg/dL (ref 6–23)
CO2: 25 mEq/L (ref 19–32)
Calcium: 8.4 mg/dL (ref 8.4–10.5)
Creatinine, Ser: 0.88 mg/dL (ref 0.50–1.35)
GFR calc non Af Amer: 81 mL/min — ABNORMAL LOW (ref >90)
Glucose, Bld: 113 mg/dL — ABNORMAL HIGH (ref 70–99)
Sodium: 133 mEq/L — ABNORMAL LOW (ref 135–145)

## 2011-08-19 LAB — GLUCOSE, CAPILLARY
Glucose-Capillary: 156 mg/dL — ABNORMAL HIGH (ref 70–99)
Glucose-Capillary: 110 mg/dL — ABNORMAL HIGH (ref 70–99)

## 2011-08-19 MED ORDER — ENOXAPARIN SODIUM 30 MG/0.3ML ~~LOC~~ SOLN
30.0000 mg | Freq: Every day | SUBCUTANEOUS | Status: DC
  Administered 2011-08-19 – 2011-08-23 (×5): 30 mg via SUBCUTANEOUS
  Filled 2011-08-19 (×6): qty 0.3

## 2011-08-19 MED ORDER — FUROSEMIDE 10 MG/ML IJ SOLN
20.0000 mg | Freq: Four times a day (QID) | INTRAMUSCULAR | Status: AC
  Administered 2011-08-19 (×3): 20 mg via INTRAVENOUS
  Filled 2011-08-19 (×3): qty 2

## 2011-08-19 NOTE — Progress Notes (Addendum)
   CARDIOTHORACIC SURGERY PROGRESS NOTE   R2 Days Post-Op Procedure(s) (LRB): AORTIC VALVE REPLACEMENT (AVR) (N/A)  Subjective: Feels okay.  Slept some.  Ambulating fairly well.  Objective: Vital signs: BP Readings from Last 1 Encounters:  08/19/11 134/39   Pulse Readings from Last 1 Encounters:  08/19/11 81   Resp Readings from Last 1 Encounters:  08/19/11 14   Temp Readings from Last 1 Encounters:  08/19/11 97.8 F (36.6 C) Oral    Hemodynamics: PAP: (49)/(24) 49/24 mmHg  Physical Exam:  Rhythm:   Now in Afib with variable HR 40-70  Breath sounds: clear  Heart sounds:  irreg  Incisions:  Dressings dry  Abdomen:  soft  Extremities:  warm   Intake/Output from previous day: 05/18 0701 - 05/19 0700 In: 1642.5 [P.O.:960; I.V.:570; IV Piggyback:112.5] Out: 2055 [Urine:2005; Chest Tube:50] Intake/Output this shift:    Lab Results:  Basename 08/19/11 0445 08/18/11 1700  WBC 11.5* 13.2*  HGB 10.7* 10.9*11.2*  HCT 32.2* 32.7*33.0*  PLT 110* 115*   BMET:  Basename 08/19/11 0445 08/18/11 1700 08/18/11 0352  NA 133* 134* --  K 4.0 4.5 --  CL 99 102 --  CO2 25 -- 24  GLUCOSE 113* 149* --  BUN 14 14 --  CREATININE 0.88 0.961.10 --  CALCIUM 8.4 -- 8.2*    CBG (last 3)   Basename 08/19/11 0719 08/19/11 0418 08/19/11 0013  GLUCAP 128* 129* 138*   ABG    Component Value Date/Time   PHART 7.332* 08/17/2011 1857   HCO3 23.0 08/17/2011 1857   TCO2 24 08/18/2011 1700   ACIDBASEDEF 3.0* 08/17/2011 1857   O2SAT 93.0 08/17/2011 1857   CXR: stable  Assessment/Plan: S/P Procedure(s) (LRB): AORTIC VALVE REPLACEMENT (AVR) (N/A)  Overall doing well POD2 AVR with tissue valve Postop complete heart block initially, now Afib with variable HR 40-70 Preop history of Afib on Pradaxa Expected post op acute blood loss anemia, mild, stable Expected post op volume excess, mild, diuresing   VVI pace for now  Mobilize  Diuresis  Add low dose lovenox and defer  decision regarding long term anticoagulation to Dr Laneta Simmers and Dr Boneta Lucks H 08/19/2011 9:08 AM

## 2011-08-19 NOTE — Progress Notes (Signed)
TCTS BRIEF SICU PROGRESS NOTE  2 Days Post-Op  S/P Procedure(s) (LRB): AORTIC VALVE REPLACEMENT (AVR) (N/A)   Stable day but now back in sinus rhythm with variable 1st and 2nd degree AV block  Plan: Continue VVI backup  Jezebel Pollet H 08/19/2011 5:59 PM

## 2011-08-20 ENCOUNTER — Encounter (HOSPITAL_COMMUNITY): Payer: Self-pay | Admitting: Surgery

## 2011-08-20 ENCOUNTER — Other Ambulatory Visit: Payer: Self-pay

## 2011-08-20 LAB — GLUCOSE, CAPILLARY
Glucose-Capillary: 94 mg/dL (ref 70–99)
Glucose-Capillary: 133 mg/dL — ABNORMAL HIGH (ref 70–99)

## 2011-08-20 LAB — POCT I-STAT, CHEM 8
BUN: 10 mg/dL (ref 6–23)
Calcium, Ion: 1.06 mmol/L — ABNORMAL LOW (ref 1.12–1.32)
Hemoglobin: 11.6 g/dL — ABNORMAL LOW (ref 13.0–17.0)
Sodium: 140 mEq/L (ref 135–145)
TCO2: 22 mmol/L (ref 0–100)

## 2011-08-20 LAB — CBC
HCT: 31.4 % — ABNORMAL LOW (ref 39.0–52.0)
MCH: 28.7 pg (ref 26.0–34.0)
MCHC: 33.4 g/dL (ref 30.0–36.0)
MCV: 85.8 fL (ref 78.0–100.0)
RDW: 14.8 % (ref 11.5–15.5)

## 2011-08-20 LAB — BASIC METABOLIC PANEL
BUN: 19 mg/dL (ref 6–23)
CO2: 26 mEq/L (ref 19–32)
Chloride: 101 mEq/L (ref 96–112)
Creatinine, Ser: 0.84 mg/dL (ref 0.50–1.35)
GFR calc Af Amer: >90 mL/min (ref >90)
Glucose, Bld: 93 mg/dL (ref 70–99)

## 2011-08-20 MED ORDER — METOPROLOL TARTRATE 12.5 MG HALF TABLET
12.5000 mg | ORAL_TABLET | Freq: Two times a day (BID) | ORAL | Status: DC
  Administered 2011-08-20 – 2011-08-24 (×9): 12.5 mg via ORAL
  Filled 2011-08-20 (×10): qty 1

## 2011-08-20 MED ORDER — SODIUM CHLORIDE 0.9 % IJ SOLN: 3.0000 mL | INTRAMUSCULAR | Status: DC | PRN

## 2011-08-20 MED ORDER — ASPIRIN EC 325 MG PO TBEC
325.0000 mg | DELAYED_RELEASE_TABLET | Freq: Every day | ORAL | Status: DC
  Administered 2011-08-20 – 2011-08-24 (×5): 325 mg via ORAL
  Filled 2011-08-20 (×5): qty 1

## 2011-08-20 MED ORDER — BISACODYL 10 MG RE SUPP: 10.0000 mg | Freq: Every day | RECTAL | Status: DC | PRN

## 2011-08-20 MED ORDER — DOCUSATE SODIUM 100 MG PO CAPS
200.0000 mg | ORAL_CAPSULE | Freq: Every day | ORAL | Status: DC
  Administered 2011-08-22 – 2011-08-24 (×2): 200 mg via ORAL
  Filled 2011-08-20 (×4): qty 2

## 2011-08-20 MED ORDER — TRAMADOL HCL 50 MG PO TABS
50.0000 mg | ORAL_TABLET | ORAL | Status: DC | PRN
  Administered 2011-08-23 – 2011-08-24 (×3): 100 mg via ORAL
  Filled 2011-08-20 (×3): qty 2

## 2011-08-20 MED ORDER — POTASSIUM CHLORIDE 10 MEQ/50ML IV SOLN
INTRAVENOUS | Status: AC
  Filled 2011-08-20: qty 150

## 2011-08-20 MED ORDER — OXYCODONE HCL 5 MG PO TABS
5.0000 mg | ORAL_TABLET | ORAL | Status: DC | PRN
  Administered 2011-08-20 – 2011-08-23 (×13): 10 mg via ORAL
  Filled 2011-08-20 (×13): qty 2

## 2011-08-20 MED ORDER — ONDANSETRON HCL 4 MG/2ML IJ SOLN: 4.0000 mg | Freq: Four times a day (QID) | INTRAMUSCULAR | Status: DC | PRN

## 2011-08-20 MED ORDER — AMIODARONE HCL 200 MG PO TABS
400.0000 mg | ORAL_TABLET | Freq: Two times a day (BID) | ORAL | Status: DC
  Administered 2011-08-20 – 2011-08-24 (×8): 400 mg via ORAL
  Filled 2011-08-20 (×9): qty 2

## 2011-08-20 MED ORDER — BISACODYL 5 MG PO TBEC: 10.0000 mg | DELAYED_RELEASE_TABLET | Freq: Every day | ORAL | Status: DC | PRN

## 2011-08-20 MED ORDER — POTASSIUM CHLORIDE CRYS ER 20 MEQ PO TBCR
20.0000 meq | EXTENDED_RELEASE_TABLET | Freq: Every day | ORAL | Status: AC
  Administered 2011-08-20 – 2011-08-23 (×4): 20 meq via ORAL
  Filled 2011-08-20 (×5): qty 1

## 2011-08-20 MED ORDER — FUROSEMIDE 40 MG PO TABS
40.0000 mg | ORAL_TABLET | Freq: Every day | ORAL | Status: AC
  Administered 2011-08-20 – 2011-08-23 (×4): 40 mg via ORAL
  Filled 2011-08-20 (×5): qty 1

## 2011-08-20 MED ORDER — POTASSIUM CHLORIDE 10 MEQ/50ML IV SOLN
10.0000 meq | INTRAVENOUS | Status: AC | PRN
  Administered 2011-08-20 (×3): 10 meq via INTRAVENOUS

## 2011-08-20 MED ORDER — SODIUM CHLORIDE 0.9 % IJ SOLN
3.0000 mL | Freq: Two times a day (BID) | INTRAMUSCULAR | Status: DC
  Administered 2011-08-20 – 2011-08-23 (×6): 3 mL via INTRAVENOUS

## 2011-08-20 MED ORDER — ACETAMINOPHEN 325 MG PO TABS: 650.0000 mg | ORAL_TABLET | Freq: Four times a day (QID) | ORAL | Status: DC | PRN

## 2011-08-20 MED ORDER — SODIUM CHLORIDE 0.9 % IV SOLN: 250.0000 mL | INTRAVENOUS | Status: DC | PRN

## 2011-08-20 MED ORDER — PANTOPRAZOLE SODIUM 40 MG PO TBEC
40.0000 mg | DELAYED_RELEASE_TABLET | Freq: Every day | ORAL | Status: DC
  Administered 2011-08-21 – 2011-08-24 (×4): 40 mg via ORAL
  Filled 2011-08-20 (×3): qty 1

## 2011-08-20 MED ORDER — MOVING RIGHT ALONG BOOK
Freq: Once | Status: AC
  Administered 2011-08-20: 17:00:00
  Filled 2011-08-20: qty 1

## 2011-08-20 MED ORDER — ONDANSETRON HCL 4 MG PO TABS
4.0000 mg | ORAL_TABLET | Freq: Four times a day (QID) | ORAL | Status: DC | PRN
  Filled 2011-08-20: qty 1

## 2011-08-20 MED FILL — Potassium Chloride Inj 2 mEq/ML: INTRAVENOUS | Qty: 40 | Status: AC

## 2011-08-20 MED FILL — Magnesium Sulfate Inj 50%: INTRAMUSCULAR | Qty: 10 | Status: AC

## 2011-08-20 NOTE — Progress Notes (Signed)
3 Days Post-Op Procedure(s) (LRB): AORTIC VALVE REPLACEMENT (AVR) (N/A) Subjective: No complaints  Objective: Vital signs in last 24 hours: Temp:  [97.4 F (36.3 C)-98.4 F (36.9 C)] 97.4 F (36.3 C) (05/20 0713) Pulse Rate:  [70-105] 88  (05/20 0700) Cardiac Rhythm:  [-] Normal sinus rhythm;Heart block (05/20 0400) Resp:  [13-25] 18  (05/20 0700) BP: (115-162)/(35-138) 145/55 mmHg (05/20 0700) SpO2:  [87 %-96 %] 96 % (05/20 0700) Weight:  [99.2 kg (218 lb 11.1 oz)] 99.2 kg (218 lb 11.1 oz) (05/20 0600) Sinus 92 with occ pac's. Does not appear to be heart block on monitor Hemodynamic parameters for last 24 hours:    Intake/Output from previous day: 05/19 0701 - 05/20 0700 In: 1485 [P.O.:1080; I.V.:300; IV Piggyback:105] Out: 2440 [Urine:2440] Intake/Output this shift:    General appearance: alert and cooperative Neurologic: intact Heart: regular rate and rhythm, S1, S2 normal, no murmur, click, rub or gallop Lungs: clear to auscultation bilaterally Extremities: extremities normal, atraumatic, no cyanosis or edema Wound: incision ok  Lab Results:  Basename 08/20/11 0400 08/19/11 0445  WBC 8.7 11.5*  HGB 10.5* 10.7*  HCT 31.4* 32.2*  PLT 136* 110*   BMET:  Basename 08/20/11 0400 08/19/11 0445  NA 136 133*  K 3.7 4.0  CL 101 99  CO2 26 25  GLUCOSE 93 113*  BUN 19 14  CREATININE 0.84 0.88  CALCIUM 8.7 8.4    PT/INR:  Basename 08/17/11 1224  LABPROT 17.8*  INR 1.44   ABG    Component Value Date/Time   PHART 7.332* 08/17/2011 1857   HCO3 23.0 08/17/2011 1857   TCO2 24 08/18/2011 1700   ACIDBASEDEF 3.0* 08/17/2011 1857   O2SAT 93.0 08/17/2011 1857   CBG (last 3)   Basename 08/20/11 0711 08/20/11 0336 08/19/11 2310  GLUCAP 97 94 125*    Assessment/Plan: S/P Procedure(s) (LRB): AORTIC VALVE REPLACEMENT (AVR) (N/A) Mobilize Diuresis Plan for transfer to step-down: see transfer orders Postop CHB resolved.  Will continue pacer on VVI 60.  Check ECG  today. Hold off on B-Blocker for now. If no heart block on ECG will resume low dose B-Blocker.   LOS: 3 days    Fernando Moyer K 08/20/2011

## 2011-08-20 NOTE — Plan of Care (Signed)
Problem: Phase III Progression Outcomes Goal: Time patient transferred to PCTU/Telemetry POD Outcome: Progressing @ 1545 Goal: Ambulates with pain/dyspnea controlled Outcome: Progressing Currently on 4L/[place].  Still having some shortness of breath while ambulating.

## 2011-08-20 NOTE — Progress Notes (Signed)
Patient ambulated to 2017 without difficulty with 1+ assist pushing wheelchair.  A little short of breath while walking.  O2 on at 6L/[address].  Assisted to chair in 2017.  Placed on monitor.  Tolerated ambulation of 573ft. Good with some shortness of breath.

## 2011-08-21 LAB — GLUCOSE, CAPILLARY
Glucose-Capillary: 86 mg/dL (ref 70–99)
Glucose-Capillary: 126 mg/dL — ABNORMAL HIGH (ref 70–99)

## 2011-08-21 MED FILL — Heparin Sodium (Porcine) Inj 1000 Unit/ML: INTRAMUSCULAR | Qty: 30 | Status: AC

## 2011-08-21 MED FILL — Mannitol IV Soln 20%: INTRAVENOUS | Qty: 500 | Status: AC

## 2011-08-21 MED FILL — Sodium Bicarbonate IV Soln 8.4%: INTRAVENOUS | Qty: 50 | Status: AC

## 2011-08-21 MED FILL — Sodium Chloride Irrigation Soln 0.9%: Qty: 3000 | Status: AC

## 2011-08-21 MED FILL — Heparin Sodium (Porcine) Inj 1000 Unit/ML: INTRAMUSCULAR | Qty: 10 | Status: AC

## 2011-08-21 MED FILL — Sodium Chloride IV Soln 0.9%: INTRAVENOUS | Qty: 1000 | Status: AC

## 2011-08-21 MED FILL — Electrolyte-R (PH 7.4) Solution: INTRAVENOUS | Qty: 4000 | Status: AC

## 2011-08-21 MED FILL — Lidocaine HCl IV Inj 20 MG/ML: INTRAVENOUS | Qty: 5 | Status: AC

## 2011-08-21 NOTE — Progress Notes (Addendum)
4 Days Post-Op Procedure(s) (LRB): AORTIC VALVE REPLACEMENT (AVR) (N/A)  Subjective:  Mr. Bacha has no new complaints this morning.  He did develop an episode of A. Fibrillation, with subsequent conversion of NSR.  Objective: Vital signs in last 24 hours: Temp:  [97.4 F (36.3 C)-98 F (36.7 C)] 97.5 F (36.4 C) (05/21 0453) Pulse Rate:  [73-100] 89  (05/21 0453) Cardiac Rhythm:  [-] Normal sinus rhythm (05/21 0230) Resp:  [13-23] 19  (05/21 0453) BP: (125-174)/(47-105) 125/97 mmHg (05/21 0453) SpO2:  [93 %-97 %] 95 % (05/21 0453) Weight:  [214 lb 1.1 oz (97.1 kg)] 214 lb 1.1 oz (97.1 kg) (05/21 0600)  Intake/Output from previous day: 05/20 0701 - 05/21 0700 In: 570 [P.O.:460; I.V.:10; IV Piggyback:100] Out: 2675 [Urine:2675]  General appearance: alert, cooperative and no distress Heart: regular rate and rhythm Lungs: clear to auscultation bilaterally Abdomen: soft, non-tender; bowel sounds normal; no masses,  no organomegaly Extremities: 1+ LE edema bilaterally Wound: clean and dry  Lab Results:  Basename 08/20/11 0400 08/19/11 0445  WBC 8.7 11.5*  HGB 10.5* 10.7*  HCT 31.4* 32.2*  PLT 136* 110*   BMET:  Basename 08/20/11 0400 08/19/11 0445  NA 136 133*  K 3.7 4.0  CL 101 99  CO2 26 25  GLUCOSE 93 113*  BUN 19 14  CREATININE 0.84 0.88  CALCIUM 8.7 8.4    PT/INR: No results found for this basename: LABPROT,INR in the last 72 hours ABG    Component Value Date/Time   PHART 7.332* 08/17/2011 1857   HCO3 23.0 08/17/2011 1857   TCO2 24 08/18/2011 1700   ACIDBASEDEF 3.0* 08/17/2011 1857   O2SAT 93.0 08/17/2011 1857   CBG (last 3)   Basename 08/21/11 0557 08/20/11 1627 08/20/11 1140  GLUCAP 86 93 133*    Assessment/Plan: S/P Procedure(s) (LRB): AORTIC VALVE REPLACEMENT (AVR) (N/A)  2. CV- Episode of A. Fib last night, maintaining NSR this morning, continue Amiodarone, Metoprolol 3. Resp- wean oxygen as tolerated, continue IS 4. Fluid Balance- patients  weight is almost to baseline, will continue Lasix 5. Ambulation 6. Dispo- if maintains NSR today, will D/C Pacing wires in AM, aim for d/c end of this week   LOS: 4 days    [name] 08/21/2011   Chart reviewed, patient examined, agree with above. Can remove pacer box in am but would keep wires in until day before discharge since on amio.

## 2011-08-21 NOTE — Progress Notes (Signed)
Pt has returned to NSR, confirmed by EKG.  Will continue to monitor.  Fernando Moyer

## 2011-08-21 NOTE — Progress Notes (Signed)
Patient ambulated approximately 800 ft with rolling walker and RN on 2 liters O2 and tolerated very well.  No stops for rest, patient was enthusiastic during the walk about going further. HR stable. Left resting comfortably in chair with call bell within reach.  Will continue to monitor.  Arva Chafe

## 2011-08-21 NOTE — Progress Notes (Signed)
CARDIAC REHAB PHASE I   PRE:  Rate/Rhythm: 83 SR  BP:  Supine:   Sitting: 144/42  Standing:    SaO2: 97 4L 95 2L 92 RA 87 RA  MODE:  Ambulation: 890 ft   POST:  Rate/Rhythem: 100 SR  BP:  Supine:   Sitting: 168/43  Standing:    SaO2: 942L 1020-1110 On arrival pt on O2 4L sat 97%. O2 decreased to 2L sat 95% . O2 discontinued sat 92%, walked in room to sink pt DOE rechecked sat 87%. O2 reapplied 2L, SAT 93%. Assisted X 1 used walker and O2 2L to ambulate. Gait steady with walker. Walked 890 feet without c/o. O2 sat after walk on O2 2l 94%. Pt to recliner after walk with call light in reach.  Beatrix Fetters

## 2011-08-21 NOTE — Progress Notes (Signed)
Pt is in A-Fib, confirmed by EKG.  Rate has been holding in 90's-110's.  Other vitals stable, pt asymptomatic.  Dr. Tyrone Sage notified and received orders to start Amio 400mg  po BID, first dose tonight.  Will continue to monitor.  Arva Chafe

## 2011-08-22 MED ORDER — METOPROLOL TARTRATE 25 MG PO TABS: 12.5000 mg | ORAL_TABLET | Freq: Two times a day (BID) | ORAL | Status: DC

## 2011-08-22 MED ORDER — ASPIRIN 325 MG PO TBEC: 325.0000 mg | DELAYED_RELEASE_TABLET | Freq: Every day | ORAL | Status: DC

## 2011-08-22 MED ORDER — LACTULOSE 10 GM/15ML PO SOLN
10.0000 g | Freq: Once | ORAL | Status: AC
  Administered 2011-08-22: 10 g via ORAL
  Filled 2011-08-22: qty 15

## 2011-08-22 MED ORDER — OXYCODONE HCL 5 MG PO TABS: 5.0000 mg | ORAL_TABLET | ORAL | Status: AC | PRN

## 2011-08-22 MED ORDER — AMIODARONE HCL 400 MG PO TABS: 400.0000 mg | ORAL_TABLET | Freq: Two times a day (BID) | ORAL | Status: DC

## 2011-08-22 MED ORDER — FUROSEMIDE 40 MG PO TABS: 40.0000 mg | ORAL_TABLET | Freq: Every day | ORAL | Status: DC

## 2011-08-22 MED ORDER — LACTULOSE 10 GM/15ML PO SOLN
20.0000 g | Freq: Once | ORAL | Status: AC
  Administered 2011-08-22: 20 g via ORAL
  Filled 2011-08-22: qty 30

## 2011-08-22 MED ORDER — POTASSIUM CHLORIDE CRYS ER 20 MEQ PO TBCR: 20.0000 meq | EXTENDED_RELEASE_TABLET | Freq: Every day | ORAL | Status: DC

## 2011-08-22 NOTE — Discharge Instructions (Signed)
Activity: 1.May walk up steps                2.No lifting more than ten pounds for four weeks.                 3.No driving for four weeks.                4.Stop any activity that causes chest pain, shortness of breath, dizziness, sweating or excessive weakness.                5.Avoid straining.                6.Continue with your breathing exercises daily.  Diet: Diabetic diet and Low fat, Low saltdiet  Wound Care: May shower.  Clean wounds with mild soap and water daily. Contact the office at [phone] if any problems arise. Aortic Valve Replacement Care After Read the instructions outlined below and refer to this sheet for the next few weeks. These discharge instructions provide you with general information on caring for yourself after you leave the hospital. Your surgeon may also give you specific instructions. While your treatment has been planned according to the most current medical practices available, unavoidable complications occasionally occur. If you have any problems or questions after discharge, please call your surgeon. AFTER THE PROCEDURE  Full recovery from heart valve surgery can take several months.   Blood thinning (anticoagulation) treatment with warfarin is often prescribed for 6 weeks to 3 months after surgery for those with biological valves. It is prescribed for life for those with mechanical valves.   Recovery includes healing of the surgical incision. There is a gradual building of stamina and exercise abilities. An exercise program under the direction of a physical therapist may be recommended.   Once you have an artificial valve, your heart function and your life will return to normal. You usually feel better after surgery. Shortness of breath and fatigue should lessen. If your heart was already severely damaged before your surgery, you may continue to have problems.   You can usually resume most of your normal activities. You will have to continue to monitor  your condition. You need to watch out for blood clots and infections.   Artificial valves need to be replaced after a period of time. It is important that you see your caregiver regularly.   Some individuals with an aortic valve replacement need to take antibiotics before having dental work or other surgical procedures. This is called prophylactic antibiotic treatment. These drugs help to prevent infective endocarditis. Antibiotics are only recommended for individuals with the highest risk for developing infective endocarditis. Let your dentist and your caregiver know if you have a history of any of the following so that the necessary precautions can be taken:   A VSD.   A repaired VSD.   Endocarditis in the past.   An artificial (prosthetic) heart valve.  HOME CARE INSTRUCTIONS   Use all medications as prescribed.   Take your temperature every morning for the first week after surgery. Record these.   Weigh yourself every morning for at least the first week after surgery and record.   Do not lift more than 10 pounds (4.5 kg) until your breastbone (sternum) has healed. Avoid all activities which would place strain on your incision.   You may shower as soon as directed by your caregiver after surgery. Pat incisions dry. Do not rub incisions with washcloth or towel.   Avoid driving for   4 to 6 weeks following surgery or as instructed.   Use your elastic stockings during the day. You should wear the stockings for at least 2 weeks after discharge or longer if your ankles are swollen. The stockings help blood flow and help reduce swelling in the legs. It is easiest to put the stockings on before you get out of bed in the morning. They should fit snugly.  Pain Control  If a prescription was given for a pain reliever, please follow your doctor's directions.   If the pain is not relieved by your medicine, becomes worse, or you have difficulty breathing, call your surgeon.  Activity  Take  frequent rest periods throughout the day.   Wait one week before returning to strenuous activities such as heavy lifting (more than 10 pounds), pushing or pulling.   Talk with your doctor about when you may return to work and your exercise routine.   Do not drive while taking prescription pain medication.  Nutrition  You may resume your normal diet.   Drink plenty of fluids (6-8 glasses a day).   Eat a well-balanced diet.   Call your caregiver for persistent nausea or vomiting.  Elimination Your normal bowel function should return. If constipation should occur, you may:  Take a mild laxative.   Add fruit and bran to your diet.   Drink more fluids.   Call your doctor if constipation is not relieved.  SEEK IMMEDIATE MEDICAL CARE IF:   You develop chest pain which is not coming from your surgical cut (incision).   You develop shortness of breath or have difficulty breathing.   You develop a temperature over 101 F (38.3 C).   You have a sudden weight gain. Let your caregiver know what the weight gain is.   You develop a rash.   You develop any reaction or side effects to medications given.   You have increased bleeding from wounds.   You see redness, swelling, or have increasing pain in wounds.   You have pus coming from your wound.   You develop lightheadedness or feel faint.  Document Released: 10/05/2004 Document Revised: 03/08/2011 Document Reviewed: 12/27/2004 [employer] Patient Information 2012 [employer].    

## 2011-08-22 NOTE — Progress Notes (Addendum)
5 Days Post-Op Procedure(s) (LRB): AORTIC VALVE REPLACEMENT (AVR) (N/A)  Subjective: Patient with complaints of constipation.  Objective: Vital signs in last 24 hours: Patient Vitals for the past 24 hrs:  BP Temp Temp src Pulse Resp SpO2 Weight  08/22/11 0434 134/45 mmHg 99 F (37.2 C) Oral 74  19  94 % -  08/22/11 0303 - - - - - - 212 lb 11.9 oz (96.5 kg)  08/21/11 2046 165/58 mmHg - Oral 90  19  95 % -  08/21/11 1404 122/44 mmHg - - 85  - 96 % -  08/21/11 1344 133/67 mmHg - - 81  - 97 % -   Pre op weight  96.2 kg Current Weight  08/22/11 212 lb 11.9 oz (96.5 kg)      Intake/Output from previous day: 05/21 0701 - 05/22 0700 In: 480 [P.O.:480] Out: 2255 [Urine:2255]   Physical Exam:  Cardiovascular: RRR;murmur Pulmonary: Slightly diminished at bases; no rales, wheezes, or rhonchi. Abdomen: Soft, non tender, bowel sounds present. Extremities: Trace bilateral lower extremity edema. Wound: Clean and dry.  No erythema or signs of infection.  Lab Results: CBC: Basename 08/20/11 0400  WBC 8.7  HGB 10.5*  HCT 31.4*  PLT 136*   BMET:  Basename 08/20/11 0400  NA 136  K 3.7  CL 101  CO2 26  GLUCOSE 93  BUN 19  CREATININE 0.84  CALCIUM 8.7    PT/INR: No results found for this basename: LABPROT,INR in the last 72 hours ABG:  INR: Will add last result for INR, ABG once components are confirmed Will add last 4 CBG results once components are confirmed  Assessment/Plan:  1. CV - Previous afib.Maintaining SR.Continue Amiodarone 400 bid and Lopressor 12.5 bid.Probably disconnect external pacer. 2.  Pulmonary - Encourage incentive spirometer.Wean O2 as tolerates. 3. Volume Overload - Diurese. 4.  Acute blood loss anemia - Last H/H 10.5/31.4. 5.Mild thrombocytopenia-last platelet count 136,000. 6.Pre op HGA1C 6.3.CBGs 86/126/93.Will need follow up as outpatient. 7.LOC constipation. 8.Probable discharge Friday.   ZIMMERMAN,DONIELLE MPA-C 08/22/2011    Chart  reviewed, patient examined, agree with above. Can remove pacer wires tomorrow.

## 2011-08-22 NOTE — Progress Notes (Signed)
CARDIAC REHAB PHASE I   PRE:  Rate/Rhythm: 87SR  BP:  Supine:   Sitting: 150/50  Standing:    SaO2: 92-93%RA took off 2L in room to check sats  MODE:  Ambulation: 1140 ft   POST:  Rate/Rhythem: 103ST  BP:  Supine:   Sitting: 150/50   Standing:    SaO2: 92-93%RA first stop, 89-91%RA second stop, 91%RA room [phone] Pt tolerated increase in distance well. Had to encourage pt to take standing rest breaks so I could check sats. Denied SOB but appeared slightly dyspneic. Sats good on RA. Left off oxygen and notified RN. To recliner after walk with call bell.  Duanne Limerick

## 2011-08-22 NOTE — Discharge Summary (Signed)
Physician Discharge Summary  Patient ID: Fernando Moyer MRN: [id number] DOB/AGE: January 08, 1934 78 y.o.  Admit date: 08/17/2011 Discharge date: 08/24/2011  Admission Diagnoses: 1.Severe aortic stenosis 2.History of GERD 3.History of hyperlipidemia 4.History of COPD (emphysema) 5.History of hypertension 6.History of AAA 7.History of PVD 8.History of carotid artery occlusion  Discharge Diagnoses:  1.Severe aortic stenosis 2.History of GERD 3.History of hyperlipidemia 4.History of COPD (emphysema) 5.History of hypertension 6.History of AAA 7.History of PVD 8.History of carotid artery occlusion 9.Post op CHB (resolved prior to discharge) 10.Mild thrombocytopenia 11.ABL anemia   Procedure (s): Median sternotomy, extracorporeal circulation,  aortic valve replacement using a 21-mm Edwards Pericardial Magna-Ease  Valve by Dr. Laneta Simmers on 08/17/2011.   History of Presenting Illness: This is a 78 year old Caucasian male with a history of carotid artery disease and right iliac artery occlusion who had a history of moderate aortic stenosis by echocardiogram in April 2012 (a mean gradient of 35 at that time.)He was admitted to the hospital in November of 2012 with chest pain and was found to be in atrial fibrillation with rapid ventricular response. He converted on Cardizem and was subsequently started on a beta blocker and Pradaxa. A repeat echocardiogram showed an increase in the mean gradient to 39 mm mercury with moderate to severe aortic stenosis. He underwent a cardiac catheterization which showed moderately severe aortic stenosis with a calculated valve area of 0.9 cm and nonobstructive coronary disease. He had a repeat echocardiogram in March of 2013 that showed progression of his aortic stenosis to severe with and an increase in his mean gradient to 50 mm mercury. He has had dyspnea with exertion, although he has been somewhat limited due to lower extremity claudication and degenerative  lower spine disease. He had pulmonary function testing performed which showed mild obstructive airway disease and moderately reduced diffusion capacity of 62% of predicted. He was seen in the office by Dr. Laneta Simmers  to evaluate his evere aortic stenosis.Potential risks, benefits, and complications were discussed with the patient and he agreed to proceed.He was [facility] to [facility] on 5/17 in order to undergo an aortic valve replacement.  Brief Hospital Course:  He was extubated successfully the day of surgery. He remained afebrile and hemodynamically stable.He was initially AV paced.His Theone Murdoch, a line, chest tubes, and foley were removed early in his post operative course.He then developed complete heart block.He was VVI paced.He then developed afib.He was placed on Amiodarone.He then converted to sinus rhythm and variable first and second degree AV block.He was continued on back up VVI at 60 and had no further complete heart block.He was found to have ABL anemia. His H and H went as low as 10.5 and 31.4. He did not require a post operative transfusion.He also had mild thrombocytopenia. His platelet count went as low as 110,000 but did go up to 136,000.He was felt surgically stable for transfer from the ICU to PCTU for further convalescence on 08/20/2011.He was requiring a few liters of oxygen via [place]. He continued to progress with cardiac rehab. He has been tolerating a diet and has had a bowel movement. Epicardial pacing wires and chest tube sutures will be removed prior to his discharge.Provided he remains afebrile, hemodynamically stable, and pending morning round evaluation, he will be surgically stable for discharge on Friday 08/24/2011.  Latest Vital Signs: Blood pressure 134/45, pulse 74, temperature 99 F (37.2 C), temperature source Oral, resp. rate 19, height 5\' 7"  (1.702 m), weight 212 lb 11.9 oz (96.5  kg), SpO2 94.00%.  Physical Exam: Cardiovascular: RRR;systolic murmur  Pulmonary: Slightly  diminished at bases; no rales, wheezes, or rhonchi.  Abdomen: Soft, non tender, bowel sounds present.  Extremities: Trace bilateral lower extremity edema.  Wound: Clean and dry. No erythema or signs of infection.   Discharge Condition:Stable  Recent laboratory studies:  Lab Results  Component Value Date   WBC 8.7 08/20/2011   HGB 10.5* 08/20/2011   HCT 31.4* 08/20/2011   MCV 85.8 08/20/2011   PLT 136* 08/20/2011   Lab Results  Component Value Date   NA 136 08/20/2011   K 3.7 08/20/2011   CL 101 08/20/2011   CO2 26 08/20/2011   CREATININE 0.84 08/20/2011   GLUCOSE 93 08/20/2011      Diagnostic Studies:   Dg Chest Portable 1 View In Am  08/19/2011  *RADIOLOGY REPORT*  Clinical Data: Postop cardiac surgery  PORTABLE CHEST - 1 VIEW  Comparison: 08/18/2011; 08/17/2011; 08/15/2011  Findings:  Grossly unchanged enlarged cardiac silhouette and mediastinal contours post median sternotomy and valve repair.  Atherosclerotic calcifications within the thoracic aorta.  Interval removal of right-sided chest tube and mediastinal drains.  Interval removal of PA catheter with vascular sheath tip remaining over the superior SVC.  Lung volumes remain persistently reduced.  Pulmonary vasculature is indistinct.  Unchanged small effusions.  No definite pneumothorax.  Unchanged bones.  IMPRESSION: 1.  Interval removal of support apparatus.  No definite pneumothorax. 2.  Unchanged findings of pulmonary edema, small effusions and bibasilar opacities, possibly atelectasis.  Original Report Authenticated By: Waynard Reeds, M.D.   Discharge Orders    Future Appointments: Provider: Department: Dept Phone: Center:   10/11/2011 11:00 AM [facility] [phone] [facility]   04/09/2012 1:00 PM Chuck Hint, MD [facility] [phone] [facility]      Discharge Medications: Medication List  As of 08/22/2011  8:51 AM   STOP taking these medications         clindamycin 1 % gel      dabigatran 150 MG Caps          TAKE these medications         amiodarone 400 MG tablet   Commonly known as: PACERONE   Take 1 tablet (400 mg total) by mouth 2 (two) times daily. For one week;then take Amiodarone 400 mg po daily thereafter.      aspirin 325 MG EC tablet   Take 1 tablet (325 mg total) by mouth daily.      furosemide 40 MG tablet   Commonly known as: LASIX   Take 1 tablet (40 mg total) by mouth daily. For 4 days then stop.      metoprolol tartrate 25 MG tablet   Commonly known as: LOPRESSOR   Take 0.5 tablets (12.5 mg total) by mouth 2 (two) times daily.      omeprazole 20 MG capsule   Commonly known as: PRILOSEC   Take 20 mg by mouth daily.      oxyCODONE 5 MG immediate release tablet   Commonly known as: Oxy IR/ROXICODONE   Take 1 tablet (5 mg total) by mouth every 4 (four) hours as needed for pain.      potassium chloride SA 20 MEQ tablet   Commonly known as: K-DUR,KLOR-CON   Take 1 tablet (20 mEq total) by mouth daily. For 4 days then stop.      pravastatin 40 MG tablet   Commonly known as: PRAVACHOL   Take 40 mg by  mouth every evening.            Follow Up Appointments: Follow-up Information    Follow up with Alleen Borne, MD. (PA/LAT CXR to be taken on 09/18/2011 at 11:30 am  ;Appointment with Dr. Laneta Simmers is on 09/18/2011 at 12:30 pm)    Contact information:   [address] [place] [address] [phone]       Follow up with Tonny Bollman, MD. (Call for a follow up appointment for 2 weeks)    Contact information:   [address], [address] [place] [address] [phone]       Follow up with Willow Ora, MD. (Call for a follow up appointment regarding HGA1C 6.3)    Contact information:   [address] [place] [address] [phone]          SignedDoree Fudge MPA-C 08/22/2011, 8:51 AM

## 2011-08-23 ENCOUNTER — Encounter: Payer: Self-pay | Admitting: Surgery

## 2011-08-23 NOTE — Progress Notes (Addendum)
6 Days Post-Op Procedure(s) (LRB): AORTIC VALVE REPLACEMENT (AVR) (N/A)  Subjective: Fernando Moyer complains of some soreness in his chest, mainly occuring with cough.  Objective: Vital signs in last 24 hours: Temp:  [97.5 F (36.4 C)-98.5 F (36.9 C)] 97.5 F (36.4 C) (05/23 0508) Pulse Rate:  [78-82] 78  (05/23 0508) Cardiac Rhythm:  [-] Normal sinus rhythm;Other (Comment);Bundle branch block (05/22 2005) Resp:  [18-19] 19  (05/23 0508) BP: (131-150)/(51-68) 150/59 mmHg (05/23 0508) SpO2:  [91 %-94 %] 91 % (05/23 0508) Weight:  [210 lb 1.6 oz (95.3 kg)] 210 lb 1.6 oz (95.3 kg) (05/23 0508) Intake/Output from previous day: 05/22 0701 - 05/23 0700 In: 1593 [P.O.:1590; I.V.:3] Out: 450 [Urine:450]  General appearance: alert, cooperative and no distress Heart: regular rate and rhythm Lungs: clear to auscultation bilaterally Abdomen: soft, non-tender; bowel sounds normal; no masses,  no organomegaly Extremities: edema trace Wound: clean and dry  Lab Results: No results found for this basename: WBC:2,HGB:2,HCT:2,PLT:2 in the last 72 hours BMET: No results found for this basename: NA:2,K:2,CL:2,CO2:2,GLUCOSE:2,BUN:2,CREATININE:2,CALCIUM:2 in the last 72 hours  PT/INR: No results found for this basename: LABPROT,INR in the last 72 hours ABG    Component Value Date/Time   PHART 7.332* 08/17/2011 1857   HCO3 23.0 08/17/2011 1857   TCO2 24 08/18/2011 1700   ACIDBASEDEF 3.0* 08/17/2011 1857   O2SAT 93.0 08/17/2011 1857   CBG (last 3)   Basename 08/21/11 1631 08/21/11 1125 08/21/11 0557  GLUCAP 93 126* 86    Assessment/Plan: S/P Procedure(s) (LRB): AORTIC VALVE REPLACEMENT (AVR) (N/A)  2. CV- H/O A. FIb, maintaining NSR, continue Amidaorne and lopressor 3. Resp- encourage IS, weaned off oxygen yesterday 4. CBGs- controlled, preop A1C elevated, will need to follow up with PCP 5. Fluid Balance-  Patients weight is at baseline, some LE edema, will continue Lasix 6. Dispo- patient  doing well, plan for d/c home tomorrow    LOS: 6 days    [name] 08/23/2011    Chart reviewed, patient examined, agree with above. ECG shows sinus with RBBB (old). QTc is 460 which is ok on amio. He is doing well.  Plan home in am.

## 2011-08-23 NOTE — Progress Notes (Signed)
CARDIAC REHAB PHASE I   PRE:  Rate/Rhythm: 79 SR  BP:  Supine:   Sitting: 160/40  Standing:    SaO2: 94 RA  MODE:  Ambulation: 1240 ft   POST:  Rate/Rhythem: 91  BP:  Supine:   Sitting: 170/30  Standing:    SaO2: 95 RA [id number] Tolerated ambulation well without c/o. Gait steady without  walker. BP systolic up before and after walk. Discussed Outpt. CRP with pt. He agrees to referral to [place].  Beatrix Fetters

## 2011-08-23 NOTE — Progress Notes (Addendum)
Removed right side epicardial wires (2 leads intact). I met resistance with the left epicardial wires.  I called [name], PA. Denny Peon said she would come and see pt.  Pt is on bedrest for one hour.  Call bell within reach and frequent v/s are being assessed.  Will continue to monitor.  Erin Barrett, PA came by and removed last epicardial wire.  Intact and pt still on bed rest.  Pt is sleeping at this time.  Call bell is within reach.  Will continue to monitor.  Shuler Hoff

## 2011-08-24 ENCOUNTER — Encounter (HOSPITAL_COMMUNITY): Payer: Self-pay

## 2011-08-24 NOTE — Plan of Care (Signed)
Problem: Discharge Progression Outcomes Goal: Other Discharge Outcomes/Goals Outcome: Completed/Met Date Met:  08/24/11 Pt and granddaughter given discharge instructions and both verbalize understanding.  Pt vital signs stable and pt pain free upon discharge.  Pt escorted out of building via wheelchair to private vehicle.

## 2011-08-24 NOTE — Progress Notes (Signed)
7 Days Post-Op Procedure(s) (LRB): AORTIC VALVE REPLACEMENT (AVR) (N/A)  Subjective:  Mr. Stelle has no new complaints this morning.  He is ready to be discharged home.   Objective: Vital signs in last 24 hours: Temp:  [97.9 F (36.6 C)-98 F (36.7 C)] 98 F (36.7 C) (05/23 2013) Pulse Rate:  [68-77] 68  (05/23 2013) Cardiac Rhythm:  [-] Normal sinus rhythm;Other (Comment);Bundle branch block (05/23 1945) Resp:  [18] 18  (05/23 2013) BP: (120-152)/(29-72) 148/65 mmHg (05/23 2013) SpO2:  [91 %-95 %] 92 % (05/23 2013)  Intake/Output from previous day: 05/23 0701 - 05/24 0700 In: 1320 [P.O.:1320] Out: 200 [Urine:200]  General appearance: alert, cooperative and no distress Neurologic: intact Heart: regular rate and rhythm Lungs: clear to auscultation bilaterally Abdomen: soft, non-tender; bowel sounds normal; no masses,  no organomegaly Extremities: edema trace edema Wound: clean and dry  Lab Results: No results found for this basename: WBC:2,HGB:2,HCT:2,PLT:2 in the last 72 hours BMET: No results found for this basename: NA:2,K:2,CL:2,CO2:2,GLUCOSE:2,BUN:2,CREATININE:2,CALCIUM:2 in the last 72 hours  PT/INR: No results found for this basename: LABPROT,INR in the last 72 hours ABG    Component Value Date/Time   PHART 7.332* 08/17/2011 1857   HCO3 23.0 08/17/2011 1857   TCO2 24 08/18/2011 1700   ACIDBASEDEF 3.0* 08/17/2011 1857   O2SAT 93.0 08/17/2011 1857   CBG (last 3)   Basename 08/21/11 1631 08/21/11 1125  GLUCAP 93 126*    Assessment/Plan: S/P Procedure(s) (LRB): AORTIC VALVE REPLACEMENT (AVR) (N/A)  2. CV- H/O A. Fib, currently maintaining amiodarone and lopressor 3. Resp- encouraged continued use of IS 4. CBGs- blood sugars pretty well controlled, pre op A1c mildly elevated, will instruct patient to follow up with PCP 5. Dispo- patient doing very well, will d/c home today   LOS: 7 days    Raford Pitcher, Denny Peon 08/24/2011

## 2011-08-24 NOTE — Progress Notes (Signed)
[id number] Cardiac Rehab Completed discharge education with pt and granddaughter. They voice understanding.

## 2011-08-29 ENCOUNTER — Other Ambulatory Visit: Payer: Self-pay | Admitting: *Deleted

## 2011-08-29 ENCOUNTER — Other Ambulatory Visit: Payer: Self-pay | Admitting: Thoracic Surgery (Cardiothoracic Vascular Surgery)

## 2011-08-29 ENCOUNTER — Ambulatory Visit (HOSPITAL_COMMUNITY)
Admission: RE | Admit: 2011-08-29 | Discharge: 2011-08-29 | Disposition: A | Discharge status: Home / Self Care | Payer: Medicare Other | Source: Ambulatory Visit | Attending: Thoracic Surgery (Cardiothoracic Vascular Surgery) | Admitting: Thoracic Surgery (Cardiothoracic Vascular Surgery)

## 2011-08-29 ENCOUNTER — Ambulatory Visit: Payer: Medicare Other | Admitting: Internal Medicine

## 2011-08-29 ENCOUNTER — Ambulatory Visit
Admission: RE | Admit: 2011-08-29 | Discharge: 2011-08-29 | Disposition: A | Discharge status: Home / Self Care | Payer: Medicare Other | Source: Ambulatory Visit | Attending: Thoracic Surgery (Cardiothoracic Vascular Surgery) | Admitting: Thoracic Surgery (Cardiothoracic Vascular Surgery)

## 2011-08-29 ENCOUNTER — Encounter: Payer: Self-pay | Admitting: Surgery

## 2011-08-29 ENCOUNTER — Ambulatory Visit (INDEPENDENT_AMBULATORY_CARE_PROVIDER_SITE_OTHER): Payer: Self-pay | Admitting: Surgery

## 2011-08-29 VITALS — BP 120/64 | HR 64 | Resp 18 | Ht 67.0 in | Wt 214.0 lb

## 2011-08-29 DIAGNOSIS — I359 Nonrheumatic aortic valve disorder, unspecified: Secondary | ICD-10-CM

## 2011-08-29 DIAGNOSIS — I517 Cardiomegaly: Secondary | ICD-10-CM | POA: Diagnosis not present

## 2011-08-29 DIAGNOSIS — R0602 Shortness of breath: Secondary | ICD-10-CM

## 2011-08-29 DIAGNOSIS — I4510 Unspecified right bundle-branch block: Secondary | ICD-10-CM | POA: Insufficient documentation

## 2011-08-29 DIAGNOSIS — R42 Dizziness and giddiness: Secondary | ICD-10-CM

## 2011-08-29 DIAGNOSIS — J9819 Other pulmonary collapse: Secondary | ICD-10-CM | POA: Diagnosis not present

## 2011-08-29 DIAGNOSIS — Z9889 Other specified postprocedural states: Secondary | ICD-10-CM | POA: Diagnosis not present

## 2011-08-29 DIAGNOSIS — Z09 Encounter for follow-up examination after completed treatment for conditions other than malignant neoplasm: Secondary | ICD-10-CM

## 2011-08-29 NOTE — Progress Notes (Signed)
[address]            [name] [address]          [phone]     HPI:  Patient returns for routine postoperative follow-up having undergone aortic valve replacement with a 21 mm Edwards pericardial valve on 08/17/2011. The patient's early postoperative recovery while in the hospital was notable for postoperative atrial fibrillation. He converted to normal sinus rhythm on amiodarone and was discharged home on amiodarone and Lopressor. Since hospital discharge the patient reports that he has been feeling fairly well but over the past several days has noted significant shortness of breath particularly when doing any type of activity such as taking a shower. His family has noticed some mild swelling in his ankles. He has some incisional soreness but otherwise denies any chest pain or pressure. He denies any tachycardia or palpitations. His family does report that he had an episode of dizziness that lasted several seconds and he could not focus during that time.   Current Outpatient Prescriptions  Medication Sig Dispense Refill  . aspirin EC 325 MG EC tablet Take 1 tablet (325 mg total) by mouth daily.  30 tablet    . oxyCODONE (OXY IR/ROXICODONE) 5 MG immediate release tablet Take 1 tablet (5 mg total) by mouth every 4 (four) hours as needed for pain.  40 tablet  0  . pravastatin (PRAVACHOL) 40 MG tablet Take 40 mg by mouth every evening.      Marland Kitchen amiodarone (PACERONE) 400 MG tablet Take 1 tablet (400 mg total) by mouth 2 (two) times daily. For one week;then take Amiodarone 400 mg po daily thereafter.  60 tablet  1  . furosemide (LASIX) 40 MG tablet Take 1 tablet (40 mg total) by mouth daily. For 4 days then stop.  4 tablet  0  . metoprolol tartrate (LOPRESSOR) 25 MG tablet Take 0.5 tablets (12.5 mg total) by mouth 2 (two) times daily.  30 tablet  1  . omeprazole (PRILOSEC) 20 MG capsule Take 20 mg by mouth daily.       . potassium chloride SA (K-DUR,KLOR-CON)  20 MEQ tablet Take 1 tablet (20 mEq total) by mouth daily. For 4 days then stop.  4 tablet  0    Physical Exam: BP 120/64  Pulse 64  Resp 18  Ht 5\' 7"  (1.702 m)  Wt 214 lb (97.07 kg)  BMI 33.52 kg/m2  SpO2 95% He looks well. Cardiac exam shows a regular rate and rhythm with a soft systolic flow murmur across the aortic valve prosthesis. There is no diastolic murmur. Lung exam is clear. The chest incision is healing well and sternum is stable. There is mild bilateral ankle edema.  Diagnostic Tests:  CHEST - 2 VIEW  Comparison: Portable chest x-ray of 08/19/2011  Findings: The lungs appear better aerated. Mild basilar  atelectasis is present. No effusion is seen. Cardiomegaly is  stable. An aortic valve replacement is noted. The bones are  osteopenic.  IMPRESSION:  Improved aeration. Mild basilar atelectasis. No pleural  effusion.  Original Report Authenticated By: Juline Patch, M.D.        Impression:  I think Mr. Grandison is doing fairly well overall. His chest x-ray looks clear and an electrocardiogram shows sinus rhythm at 61 beats per minute. His vital signs are stable. He may have some mild volume excess with mild  ankle edema. I suspect his shortness of breath is probably related to his postop state, some diastolic dysfunction from LVH, mild volume excess and chronic debilitation. Preoperative pulmonary function testing showed a diffusion capacity that was reduced a 64% of predicted which is significant, although he only had minimal obstructive defect.  His oxygen saturation on room air is 95% so I doubt that he has had a PE.  Plan:  Since he is maintaining sinus rhythm I asked him to stop the amiodarone. I started him on Lasix 40 mg daily and potassium 20 mEq daily. I asked him to use his incentive spirometer and to continue walking as much as possible. He has a followup appointment on Friday with Dr. Excell Seltzer and I'll plan to see him back in about 2 weeks.

## 2011-08-31 ENCOUNTER — Ambulatory Visit (INDEPENDENT_AMBULATORY_CARE_PROVIDER_SITE_OTHER): Payer: Medicare Other | Admitting: Cardiovascular Disease

## 2011-08-31 ENCOUNTER — Encounter: Payer: Self-pay | Admitting: Cardiovascular Disease

## 2011-08-31 VITALS — BP 144/82 | HR 68 | Ht 67.0 in | Wt 209.1 lb

## 2011-08-31 DIAGNOSIS — I359 Nonrheumatic aortic valve disorder, unspecified: Secondary | ICD-10-CM

## 2011-08-31 DIAGNOSIS — I4891 Unspecified atrial fibrillation: Secondary | ICD-10-CM | POA: Diagnosis not present

## 2011-08-31 MED ORDER — METOPROLOL TARTRATE 25 MG PO TABS: 25.0000 mg | ORAL_TABLET | Freq: Two times a day (BID) | ORAL | Status: DC

## 2011-08-31 MED ORDER — FUROSEMIDE 40 MG PO TABS: 40.0000 mg | ORAL_TABLET | Freq: Every day | ORAL | Status: DC

## 2011-08-31 MED ORDER — POTASSIUM CHLORIDE CRYS ER 20 MEQ PO TBCR: 20.0000 meq | EXTENDED_RELEASE_TABLET | Freq: Every day | ORAL | Status: DC

## 2011-08-31 MED ORDER — DABIGATRAN ETEXILATE MESYLATE 150 MG PO CAPS: 150.0000 mg | ORAL_CAPSULE | Freq: Two times a day (BID) | ORAL | Status: DC

## 2011-08-31 NOTE — Progress Notes (Signed)
HPI:  78 year old gentleman presented for followup evaluation. The patient has a history of severe aortic stenosis, paroxysmal atrial fibrillation, and chronic dyspnea. He has peripheral arterial disease with chronic occlusion of the left common iliac artery. He also has a history of carotid stenosis. He recently underwent bioprosthetic aVR with a 21 mm Edwards pericardial valve on 08/17/2011. He presents today for post hospital followup. He was noted to have postoperative atrial fibrillation and was sent home on amiodarone after converting to sinus rhythm.  Overall the patient is doing relatively well. He's had some dyspnea with activity and was started back on furosemide yesterday. He already feels a little better. His amiodarone was discontinued as he was in sinus rhythm. He's had no chest pain or pressure. He's had a few episodes of lightheadedness but nothing sustained. He's had no syncope or near syncope. He had a chest x-ray yesterday is showing no signs of congestive heart failure.  Outpatient Encounter Prescriptions as of 08/31/2011  Medication Sig Dispense Refill  . aspirin EC 325 MG EC tablet Take 1 tablet (325 mg total) by mouth daily.  30 tablet    . furosemide (LASIX) 40 MG tablet Take 1 tablet (40 mg total) by mouth daily. For 4 days then stop.  4 tablet  0  . metoprolol tartrate (LOPRESSOR) 25 MG tablet Take 0.5 tablets (12.5 mg total) by mouth 2 (two) times daily.  30 tablet  1  . omeprazole (PRILOSEC) 20 MG capsule Take 20 mg by mouth daily.       Marland Kitchen oxyCODONE (OXY IR/ROXICODONE) 5 MG immediate release tablet Take 1 tablet (5 mg total) by mouth every 4 (four) hours as needed for pain.  40 tablet  0  . potassium chloride SA (K-DUR,KLOR-CON) 20 MEQ tablet Take 1 tablet (20 mEq total) by mouth daily. For 4 days then stop.  4 tablet  0  . pravastatin (PRAVACHOL) 40 MG tablet Take 40 mg by mouth 2 (two) times daily.       Marland Kitchen DISCONTD: amiodarone (PACERONE) 400 MG tablet Take 1 tablet (400  mg total) by mouth 2 (two) times daily. For one week;then take Amiodarone 400 mg po daily thereafter.  60 tablet  1    No Known Allergies  Past Medical History  Diagnosis Date  . GERD with stricture     w/hx of stricture  . PVD (peripheral vascular disease) 12/07    per cath 12/07....iliac  . Carotid artery occlusion     last u/s 3-11...Marland Kitchenper vasc.surgery 2007 cath: nonobstructive CAD. mil;d MS, trivial AoS, small AAA,  . Bowen's disease     right arm BX: referred to dermatology  . Hyperlipidemia   . Emphysema     PFT 4/10 FVC 3.45 (90%), FEV1 2.72 (106%), TLC 5.36 (93%), DLCO 74%, NO BD RESPONSE: ? OF PSEUDONORMALIZATION OF PFT FINDINGS W/BOTH OBSTRUCTIVE AND RESTRICTIVE DEFECTS.  Marland Kitchen AAA (abdominal aortic aneurysm) 05/2008    PER CARDIAC CATH  . Aortic stenosis 05/2008    PER CARDIAC CATH, MILD, MITRAL STENOSIS  . Mitral stenosis 05/2008    PER CARDIAC CATH  . Coronary artery disease 05/2008    MILD, MEDICAL MANAGEMENT  . Pulmonary hypertension   . COPD (chronic obstructive pulmonary disease)   . Hypoxemia   . Hyperlipidemia   . Hypertension 02/21/11    pt denies this hx  . Heart murmur   . GERD (gastroesophageal reflux disease)   . Dyspnea      w/u included a cath and  PFT's, sxms thought to be pulmonary  . Chronic back pain   . Peripheral neuropathy     right thigh; "it's been that way for years"  . Myocardial infarction     ROS: Negative except as per HPI  BP 144/82  Pulse 68  Ht 5\' 7"  (1.702 m)  Wt 94.856 kg (209 lb 1.9 oz)  BMI 32.75 kg/m2  PHYSICAL EXAM: Pt is alert and oriented, overweight male in NAD HEENT: normal Neck: JVP - normal, carotids 2+= with bilateral bruits Lungs: CTA bilaterally CV: RRR with grade 2/6 systolic ejection murmur at the left sternal Abd: soft, NT, Positive BS, no hepatomegaly Ext: Trace to 1+ pretibial edema bilaterally Skin: warm/dry no rash  EKG:  EKG yesterday shows sinus rhythm with right bundle branch block and left axis  deviation.  ASSESSMENT AND PLAN: 1. Severe aortic stenosis. Patient appears to be progressing fairly well after bioprosthetic AVR. I like to see him back in 6 weeks with an echocardiogram prior to his office visit so that we can establish his postoperative baseline. I think he should be continued on furosemide and potassium chloride. We'll give him a long-term prescription today.  2. Paroxysmal atrial fibrillation. The patient had A. fib prior to surgery. He is in sinus now, but his thromboembolic risk is significant. I'm going to have him discontinue aspirin and start back on pradaxa 150 mg twice daily. He tolerated this well prior to surgery. I agree with discontinuing his amiodarone at this point.  3. Hypertension. The patient will increase his metoprolol to 25 mg twice daily, especially since he is stopping amiodarone.  Tonny Bollman 08/31/2011 2:28 PM

## 2011-08-31 NOTE — Patient Instructions (Addendum)
Your physician has recommended you make the following change in your medication: STOP Aspirin, START Pradaxa 150mg  take one by mouth every 12 hours, Continue taking Furosemide and Potassium, STOP Amiodarone, INCREASE Metoprolol Tartrate to 25mg  take one by mouth twice a day  Your physician recommends that you schedule a follow-up appointment in: 6 WEEKS  Your physician has requested that you have an echocardiogram in 6 WEEKS. Echocardiography is a painless test that uses sound waves to create images of your heart. It provides your doctor with information about the size and shape of your heart and how well your heart's chambers and valves are working. This procedure takes approximately one hour. There are no restrictions for this procedure.

## 2011-09-05 ENCOUNTER — Other Ambulatory Visit: Payer: Self-pay | Admitting: *Deleted

## 2011-09-05 DIAGNOSIS — G8918 Other acute postprocedural pain: Secondary | ICD-10-CM

## 2011-09-05 MED ORDER — HYDROCODONE-ACETAMINOPHEN 7.5-500 MG PO TABS: 1.0000 | ORAL_TABLET | Freq: Four times a day (QID) | ORAL | Status: AC | PRN

## 2011-09-07 ENCOUNTER — Ambulatory Visit (INDEPENDENT_AMBULATORY_CARE_PROVIDER_SITE_OTHER): Payer: Medicare Other | Admitting: Internal Medicine

## 2011-09-07 ENCOUNTER — Encounter: Payer: Self-pay | Admitting: Internal Medicine

## 2011-09-07 VITALS — BP 98/62 | HR 68 | Temp 97.6°F | Wt 205.0 lb

## 2011-09-07 DIAGNOSIS — J438 Other emphysema: Secondary | ICD-10-CM

## 2011-09-07 DIAGNOSIS — E119 Type 2 diabetes mellitus without complications: Secondary | ICD-10-CM

## 2011-09-07 HISTORY — DX: Type 2 diabetes mellitus without complications: E11.9

## 2011-09-07 MED ORDER — DOXYCYCLINE HYCLATE 100 MG PO TABS: 100.0000 mg | ORAL_TABLET | Freq: Two times a day (BID) | ORAL | Status: AC

## 2011-09-07 MED ORDER — BECLOMETHASONE DIPROPIONATE 80 MCG/ACT IN AERS: 2.0000 | INHALATION_SPRAY | Freq: Two times a day (BID) | RESPIRATORY_TRACT | Status: DC

## 2011-09-07 NOTE — Assessment & Plan Note (Addendum)
Presents with cough for several days, now with green sputum. He is in no distress, O2 sats are okay. Plan:  Mucinex, continue with Tussionex prescribed by cardiovascular surgery Doxycycline, no interaction with other meds noted  Qvar Will try to avoid albuterol since he recently has atrial fibrillation. Will call if not better. He already does incentive respiratory

## 2011-09-07 NOTE — Patient Instructions (Signed)
For cough, take Mucinex DM twice a day as needed  Continue using Tussionex as needed as well. Start Qvar Take the antibiotic as prescribed  (doxycycline) Call if no better in few days Call anytime if the symptoms are severe

## 2011-09-07 NOTE — Progress Notes (Signed)
Subjective:    Patient ID: Fernando Moyer, male    DOB: 20-Dec-1933, 78 y.o.   MRN: [id number]  HPI Acute visit, here with Efraim Kaufmann, his granddaughter. --The reason for the visit is cough, cough  started after cardiac surgery few weeks ago, it improved to some extent but it come back a few days ago, today they saw some dark green sputum. He called his cardiovascular surgeon a couple of days ago and was prescribed Tussionex. Also he had a chest x-ray 08/29/2011 which show improved aeration and no acute findings --Status post and aortic valve replacement last month, chart reviewed. Has seen seems cardiology and cardiovascular surgery.   -- Was also found to have mild diabetes last month. See assessment and plan.  Past Medical History  Diagnosis Date  . GERD with stricture     w/hx of stricture  . PVD (peripheral vascular disease) 12/07    per cath 12/07....iliac  . Carotid artery occlusion     last u/s 3-11...Marland Kitchenper vasc.surgery 2007 cath: nonobstructive CAD. mil;d MS, trivial AoS, small AAA,  . Bowen's disease     right arm BX: referred to dermatology  . Hyperlipidemia   . Emphysema     PFT 4/10 FVC 3.45 (90%), FEV1 2.72 (106%), TLC 5.36 (93%), DLCO 74%, NO BD RESPONSE: ? OF PSEUDONORMALIZATION OF PFT FINDINGS W/BOTH OBSTRUCTIVE AND RESTRICTIVE DEFECTS.  Marland Kitchen AAA (abdominal aortic aneurysm) 05/2008    PER CARDIAC CATH  . Aortic stenosis 05/2008    PER CARDIAC CATH, MILD, MITRAL STENOSIS  . Mitral stenosis 05/2008    PER CARDIAC CATH  . Coronary artery disease 05/2008    MILD, MEDICAL MANAGEMENT  . Pulmonary hypertension   . COPD (chronic obstructive pulmonary disease)   . Hypoxemia   . Hyperlipidemia   . Hypertension 02/21/11    pt denies this hx  . Heart murmur   . Dyspnea      w/u included a cath and PFT's, sxms thought to be pulmonary  . Chronic back pain   . Peripheral neuropathy     right thigh; "it's been that way for years"  . Myocardial infarction   . Diabetes mellitus  09/07/2011     Past Surgical History  Procedure Date  . Appendectomy   . Esophageal dilation   . Cardiac catheterization 02/27/06    DR. DOWNEY: CORONARY ANGIOGRAPHY, ABDOMINAL AORTOGRAPHY  . Endarterectomy 08-2010    Right carotid endarterectomy    . Carotid endarterectomy 08/2010  . Aortic valve replacement 08/17/2011    Procedure: AORTIC VALVE REPLACEMENT (AVR);  Surgeon: Alleen Borne, MD;  Location: [facility] OR;  Service: Open Heart Surgery;  Laterality: N/A;     Review of Systems No fever or chills No GERD symptoms He has a history of emphysema, before the surgery he was barely symptomatic, cough has developed after the surgery. Shortness of breath is at baseline, he rarely has wheezing. Denies nasal or sinus pain or congestion.     Objective:   Physical Exam General -- alert, well-developed, and overweight appearing. No apparent distress.  HEENT --  nose not congested  Lungs -- few rhonchi mostly with cough, few end expiratory wheezing. No increased work of breathing Heart-- mild systolic murmur noted, seems regular today Neurologic-- alert & oriented X3 and strength normal in all extremities. Psych-- Cognition and judgment appear intact. Alert and cooperative with normal attention span and concentration.  not anxious appearing and not depressed appearing.       Assessment & Plan:  Today , I spent more than 25 min with the patient, >50% of the time counseling (DM), and   reviewing the chart and labs ordered by other providers

## 2011-09-07 NOTE — Assessment & Plan Note (Addendum)
Diagnosed with mild diabetes recently in the hospital, A1c 6.3. He already received some dietary advice. Concept of A1c discussed with the patient and Efraim Kaufmann, his granddaughter. Followup in 3 months.

## 2011-09-08 ENCOUNTER — Encounter: Payer: Self-pay | Admitting: Internal Medicine

## 2011-09-11 ENCOUNTER — Other Ambulatory Visit: Payer: Self-pay | Admitting: *Deleted

## 2011-09-11 DIAGNOSIS — G8918 Other acute postprocedural pain: Secondary | ICD-10-CM

## 2011-09-11 MED ORDER — TRAMADOL HCL 50 MG PO TABS: 50.0000 mg | ORAL_TABLET | Freq: Four times a day (QID) | ORAL | Status: AC | PRN

## 2011-09-13 ENCOUNTER — Other Ambulatory Visit: Payer: Self-pay | Admitting: Surgery

## 2011-09-13 DIAGNOSIS — I359 Nonrheumatic aortic valve disorder, unspecified: Secondary | ICD-10-CM

## 2011-09-18 ENCOUNTER — Ambulatory Visit (INDEPENDENT_AMBULATORY_CARE_PROVIDER_SITE_OTHER): Payer: Self-pay | Admitting: Surgery

## 2011-09-18 ENCOUNTER — Encounter: Payer: Self-pay | Admitting: Surgery

## 2011-09-18 ENCOUNTER — Ambulatory Visit
Admission: RE | Admit: 2011-09-18 | Discharge: 2011-09-18 | Disposition: A | Discharge status: Home / Self Care | Payer: Medicare Other | Source: Ambulatory Visit | Attending: Surgery | Admitting: Surgery

## 2011-09-18 VITALS — BP 124/65 | HR 67 | Resp 20 | Ht 67.0 in | Wt 199.0 lb

## 2011-09-18 DIAGNOSIS — R918 Other nonspecific abnormal finding of lung field: Secondary | ICD-10-CM | POA: Diagnosis not present

## 2011-09-18 DIAGNOSIS — I359 Nonrheumatic aortic valve disorder, unspecified: Secondary | ICD-10-CM

## 2011-09-18 DIAGNOSIS — J984 Other disorders of lung: Secondary | ICD-10-CM | POA: Diagnosis not present

## 2011-09-18 DIAGNOSIS — J9819 Other pulmonary collapse: Secondary | ICD-10-CM | POA: Diagnosis not present

## 2011-09-20 ENCOUNTER — Encounter: Payer: Self-pay | Admitting: Surgery

## 2011-09-20 NOTE — Progress Notes (Signed)
HPI:  Patient returns for routine postoperative follow-up having undergone aortic valve replacement with a 21 mm Edwards pericardial valve on 08/17/2011.  The patient's early postoperative recovery while in the hospital was notable for postoperative atrial fibrillation. He converted to normal sinus rhythm on amiodarone and was discharged home on amiodarone and Lopressor.   When I saw him on 08/29/2011 he was still complaining of dyspnea and I started him on a course of Lasix. He said that this did improve his breathing fairly quickly. I also stopped his amiodarone at that time since he appeared to be in sinus rhythm. Since I last saw him he was started on long-term Lasix by Dr. Excell Seltzer and is also undergoing treatment for bronchitis by Dr. Drue Novel. His complaints today are of fatigue as well as occasional dizziness. He and his granddaughter feel that his fatigue may have increased since his Lopressor dose has been increased.   Current Outpatient Prescriptions  Medication Sig Dispense Refill  . beclomethasone (QVAR) 80 MCG/ACT inhaler Inhale 2 puffs into the lungs 2 (two) times daily.  1 Inhaler  3  . dabigatran (PRADAXA) 150 MG CAPS Take 1 capsule (150 mg total) by mouth every 12 (twelve) hours.  60 capsule  6  . furosemide (LASIX) 40 MG tablet Take 1 tablet (40 mg total) by mouth daily.  30 tablet  6  . metoprolol tartrate (LOPRESSOR) 25 MG tablet Take 1 tablet (25 mg total) by mouth 2 (two) times daily.  60 tablet  6  . omeprazole (PRILOSEC) 20 MG capsule Take 20 mg by mouth daily.       . potassium chloride SA (K-DUR,KLOR-CON) 20 MEQ tablet Take 1 tablet (20 mEq total) by mouth daily.  30 tablet  6  . pravastatin (PRAVACHOL) 40 MG tablet Take 40 mg by mouth daily.       . traMADol (ULTRAM) 50 MG tablet Take 1 tablet (50 mg total) by mouth every 6 (six) hours as needed for pain (may take 1 or 2 tabs every 6 hrs, not to exceed 6 tabs per day).  30 tablet  0     Physical Exam: BP 124/65  Pulse 67   Resp 20  Ht 5\' 7"  (1.702 m)  Wt 199 lb (90.266 kg)  BMI 31.17 kg/m2  SpO2 95% He looks tired and doesn't have much to say but is in no distress. Lung exam is clear. Cardiac exam shows regular rate and rhythm with normal valve sounds. Chest incision is healing well and sternum is stable. There is no peripheral edema.  Diagnostic Tests:   RADIOLOGY REPORT*  Clinical Data: Status post aortic valve surgery, follow-up.  CHEST - 2 VIEW  Comparison: 08/29/2011  Findings: Status post median sternotomy and aortic valve  replacement. Heart size within normal range. Mitral annular  calcification noted. Aortic atherosclerosis.  Mild lung base scarring. Otherwise, no focal consolidation,  pleural effusion, or pneumothorax. Increased AP diameter suggest  hyperinflation and there is evidence of associated emphysematous  change.  Multilevel degenerative changes of the spine. No definite acute  osseous finding.  IMPRESSION:  Status post aortic valve replacement. Bibasilar atelectasis has  cleared in the interval with mild residual lung base scarring.  Mitral annular calcification.  Emphysematous changes.  Original Report Authenticated By: Waneta Martins, M.D.   A/P:    Overall I think he is making slow but steady progress following his aortic valve replacement. If his fatigue and occasional dizziness do not improve it might be worthwhile decreasing  or stopping his Lopressor to see if that is having an effect. He does not appear dehydrated at this time. 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 He is going to continue following up with Dr. Excell Seltzer and Dr. Drue Novel and will contact me if he develops any problems with his incisions.

## 2011-09-28 ENCOUNTER — Encounter: Payer: Self-pay | Admitting: Family Medicine

## 2011-09-28 ENCOUNTER — Telehealth: Payer: Self-pay | Admitting: Internal Medicine

## 2011-09-28 ENCOUNTER — Ambulatory Visit (INDEPENDENT_AMBULATORY_CARE_PROVIDER_SITE_OTHER): Payer: Medicare Other | Admitting: Family Medicine

## 2011-09-28 VITALS — BP 106/72 | HR 94 | Temp 97.8°F | Ht 67.0 in | Wt 199.8 lb

## 2011-09-28 DIAGNOSIS — R42 Dizziness and giddiness: Secondary | ICD-10-CM | POA: Diagnosis not present

## 2011-09-28 DIAGNOSIS — I951 Orthostatic hypotension: Secondary | ICD-10-CM | POA: Diagnosis not present

## 2011-09-28 MED ORDER — OXYCODONE HCL 5 MG PO TABS: 5.0000 mg | ORAL_TABLET | ORAL | Status: DC | PRN

## 2011-09-28 MED ORDER — MECLIZINE HCL 25 MG PO TABS: 25.0000 mg | ORAL_TABLET | Freq: Three times a day (TID) | ORAL | Status: AC | PRN

## 2011-09-28 NOTE — Patient Instructions (Addendum)
This appears to be positional vertigo Make sure you drink plenty of fluids, change position slowly Use the Meclizine as needed REST!  You need time to recover Decrease your metoprolol to 1/2 tab twice daily If your symptoms change or worsen- please call or go to the ER Hang in there!!!

## 2011-09-28 NOTE — Telephone Encounter (Signed)
Caller: Melissa/Other; PCP: Willow Ora; CB#: [phone]; ; ; Call regarding Dizzy;  Onset- 09/25/2011 Afebrile. Pt is having dizziness and sensation that room is spinning. Emergent s/s of  Dizziness or Vertigo protocol r/o. Pt to see provider within 4 hrs. Appt scheduled today with Dr. Beverely Low for 1:30pm.

## 2011-09-28 NOTE — Progress Notes (Signed)
  Subjective:    Patient ID: Fernando Moyer, male    DOB: 1934/01/18, 78 y.o.   MRN: [id number]  HPI Dizziness- had 'dizzy spells' off and on starting on Tuesday.  Today sxs became constant.  Pt fearful to drive.  Will have sensation of room spinning w/ some blurring when looking to the R. + nausea.  sxs worsen w/ turning head and changing position.  Described as a 'drunk' feeling.  Denies weakness or numbness of arms or legs.  Hx of similar but 'it hasn't lasted this long'.  Granddaughter reports increased weakness and fatigue- there is some question as to whether this has to do w/ increased metoprolol dose.  Pt is currently recovering from aortic valve replacement, bronchitis.  Decreased water intake.  Denies ear pain, some sinus pressure (frontal), no fevers, cough is improving.   Review of Systems For ROS see HPI     Objective:   Physical Exam  Vitals reviewed. Constitutional: He is oriented to person, place, and time. He appears well-developed and well-nourished. No distress.  HENT:  Head: Normocephalic and atraumatic.  Mouth/Throat: Oropharynx is clear and moist.       No TTP over sinuses TMs normal bilaterally  Eyes: Conjunctivae and EOM are normal. Pupils are equal, round, and reactive to light.  Neck: Neck supple.  Cardiovascular: Normal rate, regular rhythm and intact distal pulses.   Murmur (II-III/VI SEM w/ soft mechanical click) heard. Pulmonary/Chest: Effort normal and breath sounds normal. No respiratory distress. He has no wheezes. He has no rales.  Musculoskeletal: He exhibits no edema.  Lymphadenopathy:    He has no cervical adenopathy.  Neurological: He is alert and oriented to person, place, and time. No cranial nerve deficit. Coordination normal.       Rapid alternating movement intact Finger-to-nose normal Gait normal Mild dizziness w/ position change + dizziness w/ turning of head  Skin: Skin is warm and dry.          Assessment & Plan:

## 2011-09-29 NOTE — Assessment & Plan Note (Addendum)
New to provider.  Likely multifactorial- dizziness w/ rapid head movement consistent w/ vertigo.  Dizziness w/ standing consistent w/ orthostasis.  Hx of vertigo.  Has had some orthostasis since increasing Metoprolol.  Will start Meclizine for vertigo, decrease toprol to 1/2 tab bid.  Encouraged increased fluid intake, rest (due to recent surgery and illness), changing positions slowly.  Pt to call or go to ER if sxs change or worsen.  Pt and granddaughter express understanding and are in agreement.

## 2011-09-29 NOTE — Assessment & Plan Note (Signed)
See above.  Decrease beta blocker dose and f/u w/ cards.  Will forward note.

## 2011-10-09 ENCOUNTER — Telehealth: Payer: Self-pay | Admitting: *Deleted

## 2011-10-09 NOTE — Telephone Encounter (Signed)
Caller reports that patient has been released by surgeon which prescribed pain medicine & has had F/U OV with you 06.07.13 [post 05.17.13 Sx]; patient still having pain and only has a couple of pain medication left; requesting refill/SLS EMR shows Hydrocodone-APAP 5-500 mg filled 06.05.13 #40x0 [and also Roxicodone printed by Dr. Beverely Low at 06.28.13 OV #30x0]  Please advise.

## 2011-10-09 NOTE — Telephone Encounter (Signed)
Please ask the patient what type of pain is he having (I suspect is from the surgery) Also needs to take either hydrocodone or oxycodone not both, let me know what is his preference

## 2011-10-09 NOTE — Telephone Encounter (Signed)
LMOM with contact name & number for patient call back to clarify on pain type & medication preference/SLS

## 2011-10-10 MED ORDER — OXYCODONE HCL 5 MG PO TABS: 5.0000 mg | ORAL_TABLET | ORAL | Status: DC | PRN

## 2011-10-10 NOTE — Telephone Encounter (Signed)
Spoke with pt's granddaughter. Pt is requesting a refill on his oxycodone. OK to refill?

## 2011-10-10 NOTE — Telephone Encounter (Signed)
Notified pt's granddaughter rx is ready for pickup.

## 2011-10-10 NOTE — Telephone Encounter (Signed)
Done

## 2011-10-10 NOTE — Telephone Encounter (Signed)
Patient's EC [Fernando Moyer] called back and reports that patient's pain coming form Valve Sx and pt preference on pain medication is the Oxycodone [last Rx by Dr. Beverely Low at 06.28.13 OV #30x0]/SLS Please advise.

## 2011-10-11 ENCOUNTER — Encounter: Payer: Self-pay | Admitting: Neurosurgery

## 2011-10-11 ENCOUNTER — Encounter (HOSPITAL_COMMUNITY): Payer: Self-pay

## 2011-10-11 ENCOUNTER — Encounter (HOSPITAL_COMMUNITY)
Admission: RE | Admit: 2011-10-11 | Discharge: 2011-10-11 | Disposition: A | Discharge status: Home / Self Care | Payer: Medicare Other | Source: Ambulatory Visit | Attending: Cardiovascular Disease | Admitting: Cardiovascular Disease

## 2011-10-11 ENCOUNTER — Ambulatory Visit (INDEPENDENT_AMBULATORY_CARE_PROVIDER_SITE_OTHER): Payer: Medicare Other | Admitting: Neurosurgery

## 2011-10-11 ENCOUNTER — Other Ambulatory Visit (INDEPENDENT_AMBULATORY_CARE_PROVIDER_SITE_OTHER): Payer: Medicare Other | Admitting: *Deleted

## 2011-10-11 VITALS — BP 127/91 | HR 84 | Temp 97.0°F | Resp 16 | Ht 67.0 in | Wt 201.4 lb

## 2011-10-11 DIAGNOSIS — I2789 Other specified pulmonary heart diseases: Secondary | ICD-10-CM | POA: Insufficient documentation

## 2011-10-11 DIAGNOSIS — I6529 Occlusion and stenosis of unspecified carotid artery: Secondary | ICD-10-CM | POA: Insufficient documentation

## 2011-10-11 DIAGNOSIS — K219 Gastro-esophageal reflux disease without esophagitis: Secondary | ICD-10-CM | POA: Insufficient documentation

## 2011-10-11 DIAGNOSIS — Z48812 Encounter for surgical aftercare following surgery on the circulatory system: Secondary | ICD-10-CM | POA: Diagnosis not present

## 2011-10-11 DIAGNOSIS — I10 Essential (primary) hypertension: Secondary | ICD-10-CM | POA: Insufficient documentation

## 2011-10-11 DIAGNOSIS — J4489 Other specified chronic obstructive pulmonary disease: Secondary | ICD-10-CM | POA: Insufficient documentation

## 2011-10-11 DIAGNOSIS — Z7982 Long term (current) use of aspirin: Secondary | ICD-10-CM | POA: Insufficient documentation

## 2011-10-11 DIAGNOSIS — Z954 Presence of other heart-valve replacement: Secondary | ICD-10-CM | POA: Insufficient documentation

## 2011-10-11 DIAGNOSIS — I442 Atrioventricular block, complete: Secondary | ICD-10-CM | POA: Insufficient documentation

## 2011-10-11 DIAGNOSIS — I359 Nonrheumatic aortic valve disorder, unspecified: Secondary | ICD-10-CM | POA: Insufficient documentation

## 2011-10-11 DIAGNOSIS — I252 Old myocardial infarction: Secondary | ICD-10-CM | POA: Insufficient documentation

## 2011-10-11 DIAGNOSIS — Z79899 Other long term (current) drug therapy: Secondary | ICD-10-CM | POA: Insufficient documentation

## 2011-10-11 DIAGNOSIS — G609 Hereditary and idiopathic neuropathy, unspecified: Secondary | ICD-10-CM | POA: Insufficient documentation

## 2011-10-11 DIAGNOSIS — Z5189 Encounter for other specified aftercare: Secondary | ICD-10-CM | POA: Insufficient documentation

## 2011-10-11 DIAGNOSIS — E785 Hyperlipidemia, unspecified: Secondary | ICD-10-CM | POA: Insufficient documentation

## 2011-10-11 DIAGNOSIS — I739 Peripheral vascular disease, unspecified: Secondary | ICD-10-CM | POA: Insufficient documentation

## 2011-10-11 DIAGNOSIS — I2510 Atherosclerotic heart disease of native coronary artery without angina pectoris: Secondary | ICD-10-CM | POA: Insufficient documentation

## 2011-10-11 DIAGNOSIS — I4891 Unspecified atrial fibrillation: Secondary | ICD-10-CM | POA: Insufficient documentation

## 2011-10-11 DIAGNOSIS — I714 Abdominal aortic aneurysm, without rupture, unspecified: Secondary | ICD-10-CM | POA: Insufficient documentation

## 2011-10-11 DIAGNOSIS — J449 Chronic obstructive pulmonary disease, unspecified: Secondary | ICD-10-CM | POA: Insufficient documentation

## 2011-10-11 DIAGNOSIS — Z87891 Personal history of nicotine dependence: Secondary | ICD-10-CM | POA: Insufficient documentation

## 2011-10-11 NOTE — Progress Notes (Signed)
[facility] Carotid Office Note  CC: Annual carotid duplex Referring Physician: Edilia Bo  History of Present Illness: 78 year old male patient of Dr. Edilia Bo followed for mild carotid stenosis status post right CEA in May 2012. The patient denies any signs or symptoms of CVA, TIA, amaurosis fugax or any neural deficit. The patient also denies any new medical diagnoses or recent surgery.  Past Medical History  Diagnosis Date  . GERD with stricture     w/hx of stricture  . PVD (peripheral vascular disease) 12/07    per cath 12/07....iliac  . Carotid artery occlusion     last u/s 3-11...Marland Kitchenper vasc.surgery 2007 cath: nonobstructive CAD. mil;d MS, trivial AoS, small AAA,  . Bowen's disease     right arm BX: referred to dermatology  . Hyperlipidemia   . Emphysema     PFT 4/10 FVC 3.45 (90%), FEV1 2.72 (106%), TLC 5.36 (93%), DLCO 74%, NO BD RESPONSE: ? OF PSEUDONORMALIZATION OF PFT FINDINGS W/BOTH OBSTRUCTIVE AND RESTRICTIVE DEFECTS.  Marland Kitchen AAA (abdominal aortic aneurysm) 05/2008    PER CARDIAC CATH  . Aortic stenosis 05/2008    PER CARDIAC CATH, MILD, MITRAL STENOSIS  . Mitral stenosis 05/2008    PER CARDIAC CATH  . Coronary artery disease 05/2008    MILD, MEDICAL MANAGEMENT  . Pulmonary hypertension   . COPD (chronic obstructive pulmonary disease)   . Hypoxemia   . Hyperlipidemia   . Hypertension 02/21/11    pt denies this hx  . Heart murmur   . Dyspnea      w/u included a cath and PFT's, sxms thought to be pulmonary  . Chronic back pain   . Peripheral neuropathy     right thigh; "it's been that way for years"  . Myocardial infarction   . Diabetes mellitus 09/07/2011    ROS: [x]  Positive   [ ]  Denies    General: [ ]  Weight loss, [ ]  Fever, [ ]  chills Neurologic: [ ]  Dizziness, [ ]  Blackouts, [ ]  Seizure [ ]  Stroke, [ ]  "Mini stroke", [ ]  Slurred speech, [ ]  Temporary blindness; [ ]  weakness in arms or legs, [ ]  Hoarseness Cardiac: [ ]  Chest  pain/pressure, [ ]  Shortness of breath at rest [ ]  Shortness of breath with exertion, [ ]  Atrial fibrillation or irregular heartbeat Vascular: [ ]  Pain in legs with walking, [ ]  Pain in legs at rest, [ ]  Pain in legs at night,  [ ]  Non-healing ulcer, [ ]  Blood clot in vein/DVT,   Pulmonary: [ ]  Home oxygen, [ ]  Productive cough, [ ]  Coughing up blood, [ ]  Asthma,  [ ]  Wheezing Musculoskeletal:  [ ]  Arthritis, [ ]  Low back pain, [ ]  Joint pain Hematologic: [ ]  Easy Bruising, [ ]  Anemia; [ ]  Hepatitis Gastrointestinal: [ ]  Blood in stool, [ ]  Gastroesophageal Reflux/heartburn, [ ]  Trouble swallowing Urinary: [ ]  chronic Kidney disease, [ ]  on HD - [ ]  MWF or [ ]  TTHS, [ ]  Burning with urination, [ ]  Difficulty urinating Skin: [ ]  Rashes, [ ]  Wounds Psychological: [ ]  Anxiety, [ ]  Depression   Social History History  Substance Use Topics  . Smoking status: Former Smoker -- 2.0 packs/day for 30 years    Types: Cigarettes    Quit date: 04/02/1986  . Smokeless tobacco: Never Used   Comment: quit smoking cigarettes 04/1987  . Alcohol Use: No    Family History Family History  Problem Relation Age of Onset  .  Heart attack Father 55    MI  . Diabetes Neg Hx   . Colon cancer Neg Hx   . Prostate cancer Neg Hx   . Anesthesia problems Neg Hx   . Hypotension Neg Hx   . Malignant hyperthermia Neg Hx   . Pseudochol deficiency Neg Hx     No Known Allergies  Current Outpatient Prescriptions  Medication Sig Dispense Refill  . beclomethasone (QVAR) 80 MCG/ACT inhaler Inhale 2 puffs into the lungs 2 (two) times daily.  1 Inhaler  3  . dabigatran (PRADAXA) 150 MG CAPS Take 1 capsule (150 mg total) by mouth every 12 (twelve) hours.  60 capsule  6  . furosemide (LASIX) 40 MG tablet Take 1 tablet (40 mg total) by mouth daily.  30 tablet  6  . metoprolol tartrate (LOPRESSOR) 25 MG tablet Take 12.5 mg by mouth 2 (two) times daily.      Marland Kitchen omeprazole (PRILOSEC) 20 MG capsule Take 20 mg by mouth daily.        Marland Kitchen oxyCODONE (OXY IR/ROXICODONE) 5 MG immediate release tablet Take 1 tablet (5 mg total) by mouth every 4 (four) hours as needed for pain.  30 tablet  0  . potassium chloride SA (K-DUR,KLOR-CON) 20 MEQ tablet Take 1 tablet (20 mEq total) by mouth daily.  30 tablet  6  . pravastatin (PRAVACHOL) 40 MG tablet Take 40 mg by mouth daily.       Marland Kitchen DISCONTD: metoprolol tartrate (LOPRESSOR) 25 MG tablet Take 1 tablet (25 mg total) by mouth 2 (two) times daily.  60 tablet  6    Physical Examination  Filed Vitals:   10/11/11 1154  BP: 127/91  Pulse: 84  Temp: 97 F (36.1 C)  Resp:     Body mass index is 31.54 kg/(m^2).  General:  WDWN in NAD Gait: Normal HEENT: WNL Eyes: Pupils equal Pulmonary: normal non-labored breathing , without Rales, rhonchi,  wheezing Cardiac: RRR, without  Murmurs, rubs or gallops; Abdomen: soft, NT, no masses Skin: no rashes, ulcers noted  Vascular Exam Pulses: 2+ radial pulses bilaterally Carotid bruits: Carotid pulses to auscultation no bruits are heard Extremities without ischemic changes, no Gangrene , no cellulitis; no open wounds;  Musculoskeletal: no muscle wasting or atrophy   Neurologic: A&O X 3; Appropriate Affect ; SENSATION: normal; MOTOR FUNCTION:  moving all extremities equally. 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 Laneta Simmers and the nurse at cardiac rehabilitation questioned if the patient should continue given his blood pressure during orientation today. We will notify them it is okay for the patient to continue with cardiac rehabilitation. I did discuss this with Dr. Darrick Penna. The patient will followup in one year for repeat carotid duplex. His questions were  encouraged and answered.  Lauree Chandler ANP  Clinic MD: Darrick Penna

## 2011-10-11 NOTE — Progress Notes (Signed)
Cardiac Rehab Medication Review by a Pharmacist  Does the patient  feel that his/her medications are working for him/her?  yes  Has the patient been experiencing any side effects to the medications prescribed?  yes  Does the patient measure his/her own blood pressure or blood glucose at home?  no   Does the patient have any problems obtaining medications due to transportation or finances?   no  Understanding of regimen: fair Understanding of indications: fair Potential of compliance: good    Pharmacist comments:  Pt expressed good adherence, but did not understand what most medications were for. He does, however, have a daughter who he says keeps everything straight for him. He has expressed a lack of energy, he was counseled on the potential for the metoprolol to cause that at 1st, but it should diminish with time.   Vania Rea. Darin Engels.D. Clinical Pharmacist Pager [phone] Phone [phone] 10/11/2011 9:31 AM

## 2011-10-12 ENCOUNTER — Ambulatory Visit (INDEPENDENT_AMBULATORY_CARE_PROVIDER_SITE_OTHER): Payer: Medicare Other | Admitting: Cardiovascular Disease

## 2011-10-12 ENCOUNTER — Ambulatory Visit (HOSPITAL_COMMUNITY): Payer: Medicare Other | Attending: Cardiovascular Disease

## 2011-10-12 ENCOUNTER — Encounter: Payer: Self-pay | Admitting: Cardiovascular Disease

## 2011-10-12 VITALS — BP 92/54 | HR 83 | Ht 67.0 in | Wt 202.0 lb

## 2011-10-12 DIAGNOSIS — I739 Peripheral vascular disease, unspecified: Secondary | ICD-10-CM | POA: Diagnosis not present

## 2011-10-12 DIAGNOSIS — I714 Abdominal aortic aneurysm, without rupture: Secondary | ICD-10-CM | POA: Diagnosis not present

## 2011-10-12 DIAGNOSIS — I4891 Unspecified atrial fibrillation: Secondary | ICD-10-CM | POA: Insufficient documentation

## 2011-10-12 DIAGNOSIS — J438 Other emphysema: Secondary | ICD-10-CM | POA: Diagnosis not present

## 2011-10-12 DIAGNOSIS — E785 Hyperlipidemia, unspecified: Secondary | ICD-10-CM | POA: Insufficient documentation

## 2011-10-12 DIAGNOSIS — I359 Nonrheumatic aortic valve disorder, unspecified: Secondary | ICD-10-CM | POA: Diagnosis not present

## 2011-10-12 DIAGNOSIS — I2510 Atherosclerotic heart disease of native coronary artery without angina pectoris: Secondary | ICD-10-CM | POA: Diagnosis not present

## 2011-10-12 DIAGNOSIS — Z954 Presence of other heart-valve replacement: Secondary | ICD-10-CM | POA: Diagnosis not present

## 2011-10-12 DIAGNOSIS — I10 Essential (primary) hypertension: Secondary | ICD-10-CM | POA: Insufficient documentation

## 2011-10-12 DIAGNOSIS — R0989 Other specified symptoms and signs involving the circulatory and respiratory systems: Secondary | ICD-10-CM | POA: Insufficient documentation

## 2011-10-12 DIAGNOSIS — R0609 Other forms of dyspnea: Secondary | ICD-10-CM | POA: Insufficient documentation

## 2011-10-12 NOTE — Patient Instructions (Signed)
Your physician recommends that you schedule a follow-up appointment in: 3 months with Dr Excell Seltzer  Your physician has requested that you have an abdominal aorta duplex. During this test, an ultrasound is used to evaluate the aorta. Allow 30 minutes for this exam. Do not eat after midnight the day before and avoid carbonated beverages  Your physician has recommended you make the following change in your medication:  1) Stop Metoprolol

## 2011-10-12 NOTE — Progress Notes (Signed)
HPI:  78 year old gentleman presenting for followup evaluation. The patient underwent bioprosthetic aortic valve replacement with a pericardial valve in May 2013 for treatment of severe aortic stenosis. He presents for followup evaluation.  He complains of some weakness and fatigue. He denies exertional chest pain or tightness. He does have residual soreness around his sternotomy site. He's had no leg swelling. He's had no palpitations, lightheadedness, or syncope. He has "good days and bad days." She's been compliant with his medications.He is back on Dabigatran and has had no bleeding problems.  Outpatient Encounter Prescriptions as of 10/12/2011  Medication Sig Dispense Refill  . dabigatran (PRADAXA) 150 MG CAPS Take 1 capsule (150 mg total) by mouth every 12 (twelve) hours.  60 capsule  6  . furosemide (LASIX) 40 MG tablet Take 1 tablet (40 mg total) by mouth daily.  30 tablet  6  . omeprazole (PRILOSEC) 20 MG capsule Take 20 mg by mouth daily.       Marland Kitchen oxyCODONE (OXY IR/ROXICODONE) 5 MG immediate release tablet Take 1 tablet (5 mg total) by mouth every 4 (four) hours as needed for pain.  30 tablet  0  . potassium chloride SA (K-DUR,KLOR-CON) 20 MEQ tablet Take 1 tablet (20 mEq total) by mouth daily.  30 tablet  6  . pravastatin (PRAVACHOL) 40 MG tablet Take 40 mg by mouth daily.       Marland Kitchen DISCONTD: metoprolol tartrate (LOPRESSOR) 25 MG tablet Take 12.5 mg by mouth 2 (two) times daily.      Marland Kitchen DISCONTD: beclomethasone (QVAR) 80 MCG/ACT inhaler Inhale 2 puffs into the lungs 2 (two) times daily.  1 Inhaler  3    No Known Allergies  Past Medical History  Diagnosis Date  . GERD with stricture     w/hx of stricture  . PVD (peripheral vascular disease) 12/07    per cath 12/07....iliac  . Carotid artery occlusion     last u/s 3-11...Marland Kitchenper vasc.surgery 2007 cath: nonobstructive CAD. mil;d MS, trivial AoS, small AAA,  . Bowen's disease     right arm BX: referred to dermatology  . Hyperlipidemia    . Emphysema     PFT 4/10 FVC 3.45 (90%), FEV1 2.72 (106%), TLC 5.36 (93%), DLCO 74%, NO BD RESPONSE: ? OF PSEUDONORMALIZATION OF PFT FINDINGS W/BOTH OBSTRUCTIVE AND RESTRICTIVE DEFECTS.  Marland Kitchen AAA (abdominal aortic aneurysm) 05/2008    PER CARDIAC CATH  . Aortic stenosis 05/2008    PER CARDIAC CATH, MILD, MITRAL STENOSIS  . Mitral stenosis 05/2008    PER CARDIAC CATH  . Coronary artery disease 05/2008    MILD, MEDICAL MANAGEMENT  . Pulmonary hypertension   . COPD (chronic obstructive pulmonary disease)   . Hypoxemia   . Hyperlipidemia   . Hypertension 02/21/11    pt denies this hx  . Heart murmur   . Dyspnea      w/u included a cath and PFT's, sxms thought to be pulmonary  . Chronic back pain   . Peripheral neuropathy     right thigh; "it's been that way for years"  . Myocardial infarction   . Diabetes mellitus 09/07/2011    ROS: Negative except as per HPI  BP 92/54  Pulse 83  Ht 5\' 7"  (1.702 m)  Wt 91.627 kg (202 lb)  BMI 31.64 kg/m2  SpO2 97%  PHYSICAL EXAM: Pt is alert and oriented, NAD HEENT: normal Neck: JVP - normal, carotids 2+= with bilateral bruits Lungs: CTA bilaterally CV: RRR without murmur or gallop  Abd: soft, NT, Positive BS, no hepatomegaly Ext: no C/C/E Skin: warm/dry no rash  2D Echo: Study Conclusions  - Left ventricle: The cavity size was normal. Wall thickness was increased in a pattern of moderate LVH. Systolic function was normal. The estimated ejection fraction was in the range of 55% to 65%. There was dynamic obstruction. Wall motion was normal; there were no regional wall motion abnormalities. Doppler parameters are consistent with abnormal left ventricular relaxation (grade 1 diastolic dysfunction). - Aortic valve: A prosthesis was present and functioning normally. The prosthesis had a normal range of motion. The sewing ring appeared normal, had no rocking motion, and showed no evidence of dehiscence. - Mitral valve: Calcified annulus.  Mildly thickened leaflets . - Left atrium: The atrium was mildly dilated. - Atrial septum: No defect or patent foramen ovale was identified. - Pulmonary arteries: PA peak pressure: 42mm Hg (S).  ASSESSMENT AND PLAN: 1. Severe aortic stenosis status post bioprosthetic aortic valve replacement. The patient is doing well. I've asked him to discontinue his metoprolol as his blood pressure is low and he continues to feel weak. He otherwise will remain on his current medicines. His echocardiogram was reviewed and his valve function looks good.  2. Paroxysmal atrial fibrillation. The patient is maintaining sinus rhythm. He continues on Pradaxa for anticoagulation.  For followup, and like to see him back in 6 months. He's been noted to have a small abdominal aortic aneurysm in the past and this should be followed up. An abdominal aortic ultrasound will be ordered. He is just about to start cardiac rehabilitation and I think this will benefit him.  Tonny Bollman 10/12/2011 5:51 PM

## 2011-10-12 NOTE — Progress Notes (Signed)
Echocardiogram performed.  

## 2011-10-15 ENCOUNTER — Encounter (HOSPITAL_COMMUNITY)
Admission: RE | Admit: 2011-10-15 | Discharge: 2011-10-15 | Disposition: A | Discharge status: Home / Self Care | Payer: Medicare Other | Source: Ambulatory Visit | Attending: Cardiovascular Disease | Admitting: Cardiovascular Disease

## 2011-10-15 DIAGNOSIS — Z87891 Personal history of nicotine dependence: Secondary | ICD-10-CM | POA: Diagnosis not present

## 2011-10-15 DIAGNOSIS — I714 Abdominal aortic aneurysm, without rupture: Secondary | ICD-10-CM | POA: Diagnosis not present

## 2011-10-15 DIAGNOSIS — I252 Old myocardial infarction: Secondary | ICD-10-CM | POA: Diagnosis not present

## 2011-10-15 DIAGNOSIS — Z79899 Other long term (current) drug therapy: Secondary | ICD-10-CM | POA: Diagnosis not present

## 2011-10-15 DIAGNOSIS — I442 Atrioventricular block, complete: Secondary | ICD-10-CM | POA: Diagnosis not present

## 2011-10-15 DIAGNOSIS — I359 Nonrheumatic aortic valve disorder, unspecified: Secondary | ICD-10-CM | POA: Diagnosis not present

## 2011-10-15 DIAGNOSIS — E785 Hyperlipidemia, unspecified: Secondary | ICD-10-CM | POA: Diagnosis not present

## 2011-10-15 DIAGNOSIS — J449 Chronic obstructive pulmonary disease, unspecified: Secondary | ICD-10-CM | POA: Diagnosis not present

## 2011-10-15 DIAGNOSIS — G609 Hereditary and idiopathic neuropathy, unspecified: Secondary | ICD-10-CM | POA: Diagnosis not present

## 2011-10-15 DIAGNOSIS — Z5189 Encounter for other specified aftercare: Secondary | ICD-10-CM | POA: Diagnosis not present

## 2011-10-15 DIAGNOSIS — I2789 Other specified pulmonary heart diseases: Secondary | ICD-10-CM | POA: Diagnosis not present

## 2011-10-15 DIAGNOSIS — I2510 Atherosclerotic heart disease of native coronary artery without angina pectoris: Secondary | ICD-10-CM | POA: Diagnosis not present

## 2011-10-15 DIAGNOSIS — I6529 Occlusion and stenosis of unspecified carotid artery: Secondary | ICD-10-CM | POA: Diagnosis not present

## 2011-10-15 DIAGNOSIS — K219 Gastro-esophageal reflux disease without esophagitis: Secondary | ICD-10-CM | POA: Diagnosis not present

## 2011-10-15 DIAGNOSIS — Z954 Presence of other heart-valve replacement: Secondary | ICD-10-CM | POA: Diagnosis not present

## 2011-10-15 DIAGNOSIS — I10 Essential (primary) hypertension: Secondary | ICD-10-CM | POA: Diagnosis not present

## 2011-10-15 DIAGNOSIS — Z7982 Long term (current) use of aspirin: Secondary | ICD-10-CM | POA: Diagnosis not present

## 2011-10-15 DIAGNOSIS — I4891 Unspecified atrial fibrillation: Secondary | ICD-10-CM | POA: Diagnosis not present

## 2011-10-15 DIAGNOSIS — I739 Peripheral vascular disease, unspecified: Secondary | ICD-10-CM | POA: Diagnosis not present

## 2011-10-16 NOTE — Progress Notes (Signed)
Pt started cardiac rehab today.  Pt tolerated light exercise without difficulty. Telemetry Sinus with BBB downward QRS.  This has been previously documented on previous ECG.  Vital signs stable.  Entry heart rate 98.  Dr cooper discontinued Fernando Moyer metoprolol.  Spot check CBG 165.  Fernando Moyer is not on medication for diabetes.  Will spot check CBG's the rest of the week.

## 2011-10-17 ENCOUNTER — Encounter (HOSPITAL_COMMUNITY)
Admission: RE | Admit: 2011-10-17 | Discharge: 2011-10-17 | Disposition: A | Discharge status: Home / Self Care | Payer: Medicare Other | Source: Ambulatory Visit | Attending: Cardiovascular Disease | Admitting: Cardiovascular Disease

## 2011-10-17 LAB — GLUCOSE, CAPILLARY: Glucose-Capillary: 118 mg/dL — ABNORMAL HIGH (ref 70–99)

## 2011-10-17 NOTE — Procedures (Unsigned)
CAROTID DUPLEX EXAM  INDICATION:  Followup right CEA.  HISTORY: Diabetes:  No Cardiac:  Yes Hypertension:  Yes Smoking:  Previous Previous Surgery:  Right CEA 08/01/2010 CV History: Amaurosis Fugax No, Paresthesias No, Hemiparesis No                                      RIGHT             LEFT Brachial systolic pressure:         162               118 Brachial Doppler waveforms:         WNL               Abnormal Vertebral direction of flow:        Antegrade         Retrograde DUPLEX VELOCITIES (cm/sec) CCA peak systolic                   83                59 ECA peak systolic                   122               177 ICA peak systolic                   99                103 (P); 188 (M) ICA end diastolic                   11                30 (P); 46 (M) PLAQUE MORPHOLOGY: Heterogenous/calcific PLAQUE AMOUNT:                      NA                Mild-to-moderate PLAQUE LOCATION:                                      CCA/ICA  IMPRESSION: 1. Widely patent right CEA with normal postsurgical wall changes. 2. 40% to 59% left mid ICA stenosis.  Velocities recorded on previous     exam could not be replicated. 3. Right vertebral and subclavian arteries are within normal limits. 4. Left vertebral artery is retrograde with an abnormal subclavian     artery, which represents subclavian steal.  ___________________________________________ Di Kindle. Edilia Bo, M.D.  LT/MEDQ  D:  10/11/2011  T:  10/11/2011  Job:  [id number]

## 2011-10-19 ENCOUNTER — Encounter (HOSPITAL_COMMUNITY)
Admission: RE | Admit: 2011-10-19 | Discharge: 2011-10-19 | Disposition: A | Discharge status: Home / Self Care | Payer: Medicare Other | Source: Ambulatory Visit | Attending: Cardiovascular Disease | Admitting: Cardiovascular Disease

## 2011-10-22 ENCOUNTER — Encounter (HOSPITAL_COMMUNITY)
Admission: RE | Admit: 2011-10-22 | Discharge: 2011-10-22 | Disposition: A | Discharge status: Home / Self Care | Payer: Medicare Other | Source: Ambulatory Visit | Attending: Cardiovascular Disease | Admitting: Cardiovascular Disease

## 2011-10-22 NOTE — Progress Notes (Signed)
Fernando Moyer 77 y.o. male       Nutrition Screen                                                                    YES  NO Do you live in a nursing home?  X   Do you eat out more than 3 times/week?   X  If yes, how many times per week do you eat out?   Do you have food allergies?   X If yes, what are you allergic to?  Have you gained or lost more than 10 lbs without trying?              X  If yes, how much weight have you lost and over what time period? 20 lbs gained or lost over 7 weeks/month  Do you want to lose weight?    X  If yes, what is a goal weight or amount of weight you would like to lose? 7 lb  Do you eat alone most of the time?  X    Do you eat less than 2 meals/day?  X If yes, how many meals do you eat?    Do you drink more than 3 alcohol drinks/day?  X If yes, how many drinks per day?   Are you having trouble with constipation? *  X If yes, what are you doing to help relieve constipation?  Do you have financial difficulties with buying food?*    X   Are you experiencing regular nausea/ vomiting?*     X   Do you have a poor appetite? *                                        X   Do you have trouble chewing/swallowing? *   X    Pt with diagnoses of:  X GERD          X COPD X AVR/MVR/ICD      X Dyslipidemia  / HDL< 40 / LDL>70 / High TG      X %  Body fat >goal / Body Mass Index >25 X HTN / BP >120/80 X MI X A1c >6 / CBG >126       Pt Risk Score   4       Diagnosis Risk Score  85       Total Risk Score   89                        X High Risk                Low Risk    HT: 67" Ht Readings from Last 1 Encounters:  10/12/11 5\' 7"  (1.702 m)    WT:   203.1 lb (92.3 kg) Wt Readings from Last 3 Encounters:  10/12/11 202 lb (91.627 kg)  10/11/11 201 lb 6.4 oz (91.354 kg)  10/11/11 203 lb 7.8 oz (92.3 kg)     IBW 67.3 137%IBW BMI 31.9 33.7%body fat  Meds reviewed: Lasix, K-Dur Past Medical History  Diagnosis Date  . GERD with stricture  w/hx of stricture    . PVD (peripheral vascular disease) 12/07    per cath 12/07....iliac  . Carotid artery occlusion     last u/s 3-11...Marland Kitchenper vasc.surgery 2007 cath: nonobstructive CAD. mil;d MS, trivial AoS, small AAA,  . Bowen's disease     right arm BX: referred to dermatology  . Hyperlipidemia   . Emphysema     PFT 4/10 FVC 3.45 (90%), FEV1 2.72 (106%), TLC 5.36 (93%), DLCO 74%, NO BD RESPONSE: ? OF PSEUDONORMALIZATION OF PFT FINDINGS W/BOTH OBSTRUCTIVE AND RESTRICTIVE DEFECTS.  Marland Kitchen AAA (abdominal aortic aneurysm) 05/2008    PER CARDIAC CATH  . Aortic stenosis 05/2008    PER CARDIAC CATH, MILD, MITRAL STENOSIS  . Mitral stenosis 05/2008    PER CARDIAC CATH  . Coronary artery disease 05/2008    MILD, MEDICAL MANAGEMENT  . Pulmonary hypertension   . COPD (chronic obstructive pulmonary disease)   . Hypoxemia   . Hyperlipidemia   . Hypertension 02/21/11    pt denies this hx  . Heart murmur   . Dyspnea      w/u included a cath and PFT's, sxms thought to be pulmonary  . Chronic back pain   . Peripheral neuropathy     right thigh; "it's been that way for years"  . Myocardial infarction   . Diabetes mellitus 09/07/2011       Activity level: Pt is moderately active  Wt goal: 179-191 lb ( 81.4-86.8 kg) Current tobacco use? No Food/Drug Interaction? No Labs:  Lipid Panel     Component Value Date/Time   CHOL 148 02/22/2011 0600   TRIG 171* 02/22/2011 0600   HDL 29* 02/22/2011 0600   CHOLHDL 5.1 02/22/2011 0600   VLDL 34 02/22/2011 0600   LDLCALC 85 02/22/2011 0600   Lab Results  Component Value Date   HGBA1C 6.3* 08/15/2011   08/20/11 Glucose 93  LDL goal: < 70       MI, PVD and > 2:      HTN, HDL, > 78 yo male Estimated Daily Nutrition Needs for: ? wt loss  1400-1900 Kcal , Total Fat 35-50gm, Saturated Fat 9-13 gm, Trans Fat 1.4-1.9 gm,  Sodium less than 1500 mg

## 2011-10-23 ENCOUNTER — Other Ambulatory Visit: Payer: Self-pay | Admitting: *Deleted

## 2011-10-23 NOTE — Telephone Encounter (Signed)
Last OV 09-29-11, last filled 10-10-11 #30

## 2011-10-24 ENCOUNTER — Other Ambulatory Visit: Payer: Self-pay | Admitting: Internal Medicine

## 2011-10-24 ENCOUNTER — Encounter (HOSPITAL_COMMUNITY)
Admission: RE | Admit: 2011-10-24 | Discharge: 2011-10-24 | Disposition: A | Discharge status: Home / Self Care | Payer: Medicare Other | Source: Ambulatory Visit | Attending: Cardiovascular Disease | Admitting: Cardiovascular Disease

## 2011-10-24 MED ORDER — PRAVASTATIN SODIUM 40 MG PO TABS: 40.0000 mg | ORAL_TABLET | Freq: Every day | ORAL | Status: DC

## 2011-10-24 MED ORDER — OXYCODONE HCL 5 MG PO TABS: 5.0000 mg | ORAL_TABLET | ORAL | Status: DC | PRN

## 2011-10-24 NOTE — Telephone Encounter (Signed)
Left msg to return call to clarify which pharmacy this med needs to be sent too.

## 2011-10-24 NOTE — Telephone Encounter (Signed)
Pt daughter return call Pt [facility] but they states that they faxed [place] and have yet to hear back so Pt will need to have Rx sent to local pharmacy( [facility]) because Rx will not get to him in time. Pt daughter requesting call when Rx sent.

## 2011-10-24 NOTE — Telephone Encounter (Signed)
PRAVASTATIN 40 MG, [facility] SAID THAT THEY HAVEN'T RECEIVED IT. DAUGHTER [phone],  WOULD LIKE TO BE CALLED SO SHE MAKE ARRANGEMENTS FOR HIM TO PICK UP. SORRY ALL CAPS AMYM

## 2011-10-24 NOTE — Telephone Encounter (Signed)
Left VM Rx ready for pickup.

## 2011-10-24 NOTE — Telephone Encounter (Signed)
One month supply sent to [facility] & the rest was [facility] to [facility].

## 2011-10-24 NOTE — Telephone Encounter (Signed)
He has been prescribed 60 tablets in the last 24 days. On average taking 2- 3 tablets daily. Okay to prescribe #60, no refills

## 2011-10-25 ENCOUNTER — Encounter (INDEPENDENT_AMBULATORY_CARE_PROVIDER_SITE_OTHER): Payer: Medicare Other

## 2011-10-25 DIAGNOSIS — I714 Abdominal aortic aneurysm, without rupture, unspecified: Secondary | ICD-10-CM

## 2011-10-26 ENCOUNTER — Encounter (HOSPITAL_COMMUNITY)
Admission: RE | Admit: 2011-10-26 | Discharge: 2011-10-26 | Disposition: A | Discharge status: Home / Self Care | Payer: Medicare Other | Source: Ambulatory Visit | Attending: Cardiovascular Disease | Admitting: Cardiovascular Disease

## 2011-10-29 ENCOUNTER — Encounter (HOSPITAL_COMMUNITY)
Admission: RE | Admit: 2011-10-29 | Discharge: 2011-10-29 | Disposition: A | Discharge status: Home / Self Care | Payer: Medicare Other | Source: Ambulatory Visit | Attending: Cardiovascular Disease | Admitting: Cardiovascular Disease

## 2011-10-29 NOTE — Progress Notes (Signed)
Reviewed home exercise with pt today.  Pt plans to  Walk and use home stationary bike for exercise.  Reviewed THR, pulse, RPE, sign and symptoms, and when to call 911 or MD.  Pt voiced understanding. Brian Kocourek L. Manson Passey, MS, NASM, CES

## 2011-10-31 ENCOUNTER — Encounter (HOSPITAL_COMMUNITY)
Admission: RE | Admit: 2011-10-31 | Discharge: 2011-10-31 | Disposition: A | Discharge status: Home / Self Care | Payer: Medicare Other | Source: Ambulatory Visit | Attending: Cardiovascular Disease | Admitting: Cardiovascular Disease

## 2011-10-31 NOTE — Progress Notes (Signed)
Patient was noted to have some occasional to rare PAC's. Patient is asymptomatic.  Blood pressure have been normal with an occasional resting and exertional blood pressure elevation.  Will fax exercise flow sheets for review via media manager for Dr Excell Seltzer to review.

## 2011-11-01 ENCOUNTER — Telehealth: Payer: Self-pay | Admitting: Cardiovascular Disease

## 2011-11-01 NOTE — Telephone Encounter (Signed)
Fu call Pt's granddaughter returning call about lab results

## 2011-11-01 NOTE — Telephone Encounter (Signed)
Spoke with granddaughter and she is aware of ultrasound results

## 2011-11-02 ENCOUNTER — Encounter (HOSPITAL_COMMUNITY)
Admission: RE | Admit: 2011-11-02 | Discharge: 2011-11-02 | Disposition: A | Discharge status: Home / Self Care | Payer: Medicare Other | Source: Ambulatory Visit | Attending: Cardiovascular Disease | Admitting: Cardiovascular Disease

## 2011-11-02 DIAGNOSIS — I739 Peripheral vascular disease, unspecified: Secondary | ICD-10-CM | POA: Diagnosis not present

## 2011-11-02 DIAGNOSIS — J449 Chronic obstructive pulmonary disease, unspecified: Secondary | ICD-10-CM | POA: Insufficient documentation

## 2011-11-02 DIAGNOSIS — I2789 Other specified pulmonary heart diseases: Secondary | ICD-10-CM | POA: Diagnosis not present

## 2011-11-02 DIAGNOSIS — E785 Hyperlipidemia, unspecified: Secondary | ICD-10-CM | POA: Diagnosis not present

## 2011-11-02 DIAGNOSIS — I2510 Atherosclerotic heart disease of native coronary artery without angina pectoris: Secondary | ICD-10-CM | POA: Diagnosis not present

## 2011-11-02 DIAGNOSIS — K219 Gastro-esophageal reflux disease without esophagitis: Secondary | ICD-10-CM | POA: Diagnosis not present

## 2011-11-02 DIAGNOSIS — Z7982 Long term (current) use of aspirin: Secondary | ICD-10-CM | POA: Insufficient documentation

## 2011-11-02 DIAGNOSIS — Z5189 Encounter for other specified aftercare: Secondary | ICD-10-CM | POA: Insufficient documentation

## 2011-11-02 DIAGNOSIS — I10 Essential (primary) hypertension: Secondary | ICD-10-CM | POA: Insufficient documentation

## 2011-11-02 DIAGNOSIS — Z79899 Other long term (current) drug therapy: Secondary | ICD-10-CM | POA: Diagnosis not present

## 2011-11-02 DIAGNOSIS — I714 Abdominal aortic aneurysm, without rupture, unspecified: Secondary | ICD-10-CM | POA: Insufficient documentation

## 2011-11-02 DIAGNOSIS — I4891 Unspecified atrial fibrillation: Secondary | ICD-10-CM | POA: Insufficient documentation

## 2011-11-02 DIAGNOSIS — I6529 Occlusion and stenosis of unspecified carotid artery: Secondary | ICD-10-CM | POA: Diagnosis not present

## 2011-11-02 DIAGNOSIS — I359 Nonrheumatic aortic valve disorder, unspecified: Secondary | ICD-10-CM | POA: Insufficient documentation

## 2011-11-02 DIAGNOSIS — G609 Hereditary and idiopathic neuropathy, unspecified: Secondary | ICD-10-CM | POA: Diagnosis not present

## 2011-11-02 DIAGNOSIS — I442 Atrioventricular block, complete: Secondary | ICD-10-CM | POA: Diagnosis not present

## 2011-11-02 DIAGNOSIS — Z954 Presence of other heart-valve replacement: Secondary | ICD-10-CM | POA: Diagnosis not present

## 2011-11-02 DIAGNOSIS — I252 Old myocardial infarction: Secondary | ICD-10-CM | POA: Diagnosis not present

## 2011-11-02 DIAGNOSIS — Z87891 Personal history of nicotine dependence: Secondary | ICD-10-CM | POA: Diagnosis not present

## 2011-11-02 DIAGNOSIS — J4489 Other specified chronic obstructive pulmonary disease: Secondary | ICD-10-CM | POA: Insufficient documentation

## 2011-11-05 ENCOUNTER — Encounter (HOSPITAL_COMMUNITY)
Admission: RE | Admit: 2011-11-05 | Discharge: 2011-11-05 | Disposition: A | Discharge status: Home / Self Care | Payer: Medicare Other | Source: Ambulatory Visit | Attending: Cardiovascular Disease | Admitting: Cardiovascular Disease

## 2011-11-07 ENCOUNTER — Encounter (HOSPITAL_COMMUNITY)
Admission: RE | Admit: 2011-11-07 | Discharge: 2011-11-07 | Disposition: A | Discharge status: Home / Self Care | Payer: Medicare Other | Source: Ambulatory Visit | Attending: Cardiovascular Disease | Admitting: Cardiovascular Disease

## 2011-11-07 ENCOUNTER — Other Ambulatory Visit: Payer: Self-pay | Admitting: *Deleted

## 2011-11-07 NOTE — Telephone Encounter (Signed)
Pt just filled med on 7/24- what is he taking this for and why is he taking so much

## 2011-11-07 NOTE — Telephone Encounter (Signed)
Last OV 09-29-11 , Last filled 10-24-11 #60

## 2011-11-08 ENCOUNTER — Telehealth: Payer: Self-pay | Admitting: Cardiovascular Disease

## 2011-11-08 NOTE — Telephone Encounter (Signed)
Pt granddaughter returned call and advised that he had recent open heart surgeon, however unable to determine the date or time frame, notes he has started cardiac rehab 2 weeks ago thus making pain more intense, and he has about 5-6 days left, advised we will call back with next steps, left vm for pt granddaughter to call back

## 2011-11-08 NOTE — Telephone Encounter (Signed)
granddaughter Fernando Moyer returned call and was advised surgeon that made incision needs to be the MD that fills pt RX for oxycodone and continue to treat pt, she understood and will call his office

## 2011-11-08 NOTE — Telephone Encounter (Signed)
.  left message to have patient return my call on mobile per no vm set up on home phone noted to clarify why he is taking so much and what he is taking it for

## 2011-11-08 NOTE — Telephone Encounter (Signed)
Left a message to call back.

## 2011-11-08 NOTE — Telephone Encounter (Signed)
PT'S GRANDTR CALLING RE PT  NEEDING  REFILL OF OXYCODONE FROM OPEN HEART SURGERY DR Werner Lean DID IN MAY, PLS CALL TO LET HER KNOW [phone]

## 2011-11-09 ENCOUNTER — Encounter (HOSPITAL_COMMUNITY)
Admission: RE | Admit: 2011-11-09 | Discharge: 2011-11-09 | Disposition: A | Discharge status: Home / Self Care | Payer: Medicare Other | Source: Ambulatory Visit | Attending: Cardiovascular Disease | Admitting: Cardiovascular Disease

## 2011-11-09 NOTE — Telephone Encounter (Signed)
Spoke to patient's grand daughter advised patient can take tylenol as needed for chest discomfort.

## 2011-11-09 NOTE — Telephone Encounter (Signed)
Patient called no answer.LMTC. 

## 2011-11-12 ENCOUNTER — Encounter (HOSPITAL_COMMUNITY)
Admission: RE | Admit: 2011-11-12 | Discharge: 2011-11-12 | Disposition: A | Discharge status: Home / Self Care | Payer: Medicare Other | Source: Ambulatory Visit | Attending: Cardiovascular Disease | Admitting: Cardiovascular Disease

## 2011-11-14 ENCOUNTER — Encounter (HOSPITAL_COMMUNITY)
Admission: RE | Admit: 2011-11-14 | Discharge: 2011-11-14 | Disposition: A | Discharge status: Home / Self Care | Payer: Medicare Other | Source: Ambulatory Visit | Attending: Cardiovascular Disease | Admitting: Cardiovascular Disease

## 2011-11-16 ENCOUNTER — Encounter (HOSPITAL_COMMUNITY)
Admission: RE | Admit: 2011-11-16 | Discharge: 2011-11-16 | Disposition: A | Discharge status: Home / Self Care | Payer: Medicare Other | Source: Ambulatory Visit | Attending: Cardiovascular Disease | Admitting: Cardiovascular Disease

## 2011-11-19 ENCOUNTER — Encounter (HOSPITAL_COMMUNITY)
Admission: RE | Admit: 2011-11-19 | Discharge: 2011-11-19 | Disposition: A | Discharge status: Home / Self Care | Payer: Medicare Other | Source: Ambulatory Visit | Attending: Cardiovascular Disease | Admitting: Cardiovascular Disease

## 2011-11-19 NOTE — Progress Notes (Signed)
Fernando Moyer 78 y.o. male Nutrition Note Spoke with pt.  Nutrition Plan, Nutrition Survey, and cholesterol goals reviewed with pt. Pt is following Step 1 of the Therapeutic Lifestyle Changes diet. Pt wants to lose wt. Per pt, he is eating smaller portions of food and less ice cream. Weight loss tips discussed. Pt is pre-diabetic. Pre-diabetes discussed. Pt expressed understanding of information reviewed. Nutrition Diagnosis   Food-and nutrition-related knowledge deficit related to lack of exposure to information as related to diagnosis of: ? CVD ? Pre-DM (A1c 6.3)    Obesity related to excessive energy intake as evidenced by a BMI of 31.9 Nutrition RX/ Estimated Daily Nutrition Needs for: wt loss  1400-1900 Kcal, 35-50 gm fat, 9-13 gm sat fat, 1.4-1.9 gm trans-fat, <1500 mg sodium Nutrition Intervention   Pt's individual nutrition plan including cholesterol goals reviewed with pt.   Benefits of adopting Therapeutic Lifestyle Changes discussed when Medficts reviewed.   Pt to attend the Portion Distortion class   Pt to attend the  ? Nutrition I class                     ? Nutrition II class    Pt given handouts for: ? wt loss ? pre-DM    Continue client-centered nutrition education by RD, as part of interdisciplinary care. Goal(s)   Pt to identify and limit food sources of saturated fat, trans fat, and cholesterol   Pt to identify food quantities necessary to achieve: ? wt loss to a goal wt of 179-191 lb (81.4-86.8 kg) at graduation from cardiac rehab.  Monitor and Evaluate progress toward nutrition goal with team.

## 2011-11-21 ENCOUNTER — Encounter (HOSPITAL_COMMUNITY)
Admission: RE | Admit: 2011-11-21 | Discharge: 2011-11-21 | Disposition: A | Discharge status: Home / Self Care | Payer: Medicare Other | Source: Ambulatory Visit | Attending: Cardiovascular Disease | Admitting: Cardiovascular Disease

## 2011-11-23 ENCOUNTER — Encounter (HOSPITAL_COMMUNITY)
Admission: RE | Admit: 2011-11-23 | Discharge: 2011-11-23 | Disposition: A | Discharge status: Home / Self Care | Payer: Medicare Other | Source: Ambulatory Visit | Attending: Cardiovascular Disease | Admitting: Cardiovascular Disease

## 2011-11-26 ENCOUNTER — Encounter (HOSPITAL_COMMUNITY)
Admission: RE | Admit: 2011-11-26 | Discharge: 2011-11-26 | Disposition: A | Discharge status: Home / Self Care | Payer: Medicare Other | Source: Ambulatory Visit | Attending: Cardiovascular Disease | Admitting: Cardiovascular Disease

## 2011-11-28 ENCOUNTER — Encounter (HOSPITAL_COMMUNITY)
Admission: RE | Admit: 2011-11-28 | Discharge: 2011-11-28 | Disposition: A | Discharge status: Home / Self Care | Payer: Medicare Other | Source: Ambulatory Visit | Attending: Cardiovascular Disease | Admitting: Cardiovascular Disease

## 2011-11-30 ENCOUNTER — Encounter (HOSPITAL_COMMUNITY)
Admission: RE | Admit: 2011-11-30 | Discharge: 2011-11-30 | Disposition: A | Discharge status: Home / Self Care | Payer: Medicare Other | Source: Ambulatory Visit | Attending: Cardiovascular Disease | Admitting: Cardiovascular Disease

## 2011-12-04 ENCOUNTER — Other Ambulatory Visit: Payer: Self-pay | Admitting: Cardiovascular Disease

## 2011-12-05 ENCOUNTER — Encounter (HOSPITAL_COMMUNITY)
Admission: RE | Admit: 2011-12-05 | Discharge: 2011-12-05 | Disposition: A | Discharge status: Home / Self Care | Payer: Medicare Other | Source: Ambulatory Visit | Attending: Cardiovascular Disease | Admitting: Cardiovascular Disease

## 2011-12-05 DIAGNOSIS — I6529 Occlusion and stenosis of unspecified carotid artery: Secondary | ICD-10-CM | POA: Insufficient documentation

## 2011-12-05 DIAGNOSIS — Z954 Presence of other heart-valve replacement: Secondary | ICD-10-CM | POA: Insufficient documentation

## 2011-12-05 DIAGNOSIS — I714 Abdominal aortic aneurysm, without rupture, unspecified: Secondary | ICD-10-CM | POA: Insufficient documentation

## 2011-12-05 DIAGNOSIS — G609 Hereditary and idiopathic neuropathy, unspecified: Secondary | ICD-10-CM | POA: Diagnosis not present

## 2011-12-05 DIAGNOSIS — J449 Chronic obstructive pulmonary disease, unspecified: Secondary | ICD-10-CM | POA: Diagnosis not present

## 2011-12-05 DIAGNOSIS — I10 Essential (primary) hypertension: Secondary | ICD-10-CM | POA: Diagnosis not present

## 2011-12-05 DIAGNOSIS — K219 Gastro-esophageal reflux disease without esophagitis: Secondary | ICD-10-CM | POA: Insufficient documentation

## 2011-12-05 DIAGNOSIS — Z7982 Long term (current) use of aspirin: Secondary | ICD-10-CM | POA: Diagnosis not present

## 2011-12-05 DIAGNOSIS — Z87891 Personal history of nicotine dependence: Secondary | ICD-10-CM | POA: Insufficient documentation

## 2011-12-05 DIAGNOSIS — I2510 Atherosclerotic heart disease of native coronary artery without angina pectoris: Secondary | ICD-10-CM | POA: Insufficient documentation

## 2011-12-05 DIAGNOSIS — E785 Hyperlipidemia, unspecified: Secondary | ICD-10-CM | POA: Diagnosis not present

## 2011-12-05 DIAGNOSIS — J4489 Other specified chronic obstructive pulmonary disease: Secondary | ICD-10-CM | POA: Insufficient documentation

## 2011-12-05 DIAGNOSIS — I4891 Unspecified atrial fibrillation: Secondary | ICD-10-CM | POA: Insufficient documentation

## 2011-12-05 DIAGNOSIS — Z5189 Encounter for other specified aftercare: Secondary | ICD-10-CM | POA: Insufficient documentation

## 2011-12-05 DIAGNOSIS — I359 Nonrheumatic aortic valve disorder, unspecified: Secondary | ICD-10-CM | POA: Insufficient documentation

## 2011-12-05 DIAGNOSIS — I2789 Other specified pulmonary heart diseases: Secondary | ICD-10-CM | POA: Insufficient documentation

## 2011-12-05 DIAGNOSIS — Z79899 Other long term (current) drug therapy: Secondary | ICD-10-CM | POA: Diagnosis not present

## 2011-12-05 DIAGNOSIS — I252 Old myocardial infarction: Secondary | ICD-10-CM | POA: Insufficient documentation

## 2011-12-05 DIAGNOSIS — I442 Atrioventricular block, complete: Secondary | ICD-10-CM | POA: Diagnosis not present

## 2011-12-05 DIAGNOSIS — I739 Peripheral vascular disease, unspecified: Secondary | ICD-10-CM | POA: Diagnosis not present

## 2011-12-07 ENCOUNTER — Other Ambulatory Visit: Payer: Self-pay | Admitting: Internal Medicine

## 2011-12-07 ENCOUNTER — Ambulatory Visit (INDEPENDENT_AMBULATORY_CARE_PROVIDER_SITE_OTHER): Payer: Medicare Other | Admitting: Internal Medicine

## 2011-12-07 ENCOUNTER — Encounter (HOSPITAL_COMMUNITY): Payer: Medicare Other

## 2011-12-07 VITALS — BP 142/62 | HR 85 | Temp 98.0°F | Wt 204.0 lb

## 2011-12-07 DIAGNOSIS — I2510 Atherosclerotic heart disease of native coronary artery without angina pectoris: Secondary | ICD-10-CM | POA: Diagnosis not present

## 2011-12-07 DIAGNOSIS — Z23 Encounter for immunization: Secondary | ICD-10-CM | POA: Diagnosis not present

## 2011-12-07 DIAGNOSIS — J438 Other emphysema: Secondary | ICD-10-CM

## 2011-12-07 DIAGNOSIS — E785 Hyperlipidemia, unspecified: Secondary | ICD-10-CM | POA: Diagnosis not present

## 2011-12-07 DIAGNOSIS — N390 Urinary tract infection, site not specified: Secondary | ICD-10-CM | POA: Insufficient documentation

## 2011-12-07 DIAGNOSIS — R42 Dizziness and giddiness: Secondary | ICD-10-CM

## 2011-12-07 DIAGNOSIS — I359 Nonrheumatic aortic valve disorder, unspecified: Secondary | ICD-10-CM

## 2011-12-07 DIAGNOSIS — E119 Type 2 diabetes mellitus without complications: Secondary | ICD-10-CM | POA: Diagnosis not present

## 2011-12-07 MED ORDER — OXYCODONE HCL 5 MG PO TABS: 5.0000 mg | ORAL_TABLET | ORAL | Status: DC | PRN

## 2011-12-07 NOTE — Assessment & Plan Note (Signed)
Due to check A1c. He remains active doing cardiac rehabilitation. Labs

## 2011-12-07 NOTE — Assessment & Plan Note (Signed)
Closely follow up by cardiology, still has some sternotomy pain, uses Tylenol most of the time but needs Oxycodone as needed. Prescription issued.

## 2011-12-07 NOTE — Assessment & Plan Note (Signed)
Good compliance with medicines, due for a  FLP, see instructions

## 2011-12-07 NOTE — Assessment & Plan Note (Signed)
Currently doing well, not using any inhalers. Last pneumonia shot 2007, recommend to have one today. Also recommend a flu shot this season.

## 2011-12-07 NOTE — Assessment & Plan Note (Signed)
Not on issue at the present time

## 2011-12-07 NOTE — Patient Instructions (Signed)
Come back fasting at some point next week: BMP, CBC--- dx CAD FLP, AST, ALT--- dx CAD A1c, microalb -- dx DM

## 2011-12-07 NOTE — Assessment & Plan Note (Signed)
Diagnosed with a Proteus UTI a few months ago, recheck a UA today. He is currently asymptomatic

## 2011-12-07 NOTE — Progress Notes (Signed)
  Subjective:    Patient ID: Fernando Moyer, male    DOB: 13-Dec-1933, 78 y.o.   MRN: [id number]  HPI Routine office visit, here with Melissa. COPD, he was seen with cough a few months ago, temporary use Qvar, currently doing well, is not using any inhalers. History of diabetes, very mild,  diagnosed with a A1c of 6.33 months ago, due for a A1c. Diagnosed with a UTI 3 months ago, currently asymptomatic. Cardiovascular, status post thoracic surgery, was released by surgery, still followup with cardiology. Request a refill her oxycodone which he uses as needed for pain. Most of the days Tylenol is sufficient.   Past Medical History: CAD  AAA AoS, MS , s/p AoV replacement 2013  Paroxysmal atrial fibrillation  Carotid Artery Dz ,   per vasc. surgery  PVD HYPERLIPIDEMIA EMPHYSEMA PULMONARY HYPERTENSION GERD, with h/o stricture  Dyspnea 2007: w/u included a cath and PFTs, symptoms thought to be pulmonary Bowen's disease,R arm, was refered to dermatology  Past Surgical History: Appendectomy esophageal dilatation coronary angiography, abdominal aortography-02-27-06 Dr. Samule Ohm Right endarterectomy 2012  aortic valve replacement with a 21 mm Edwards pericardial valve on 08/17/2011.  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   He enjoys watching sports. He previously smoked more than 70 pack years but quit in 1988.   Denies significant alcohol use.   Denies illicit drug use.  Review of Systems Denies frequent cough, shortness or breath is at baseline, no wheezing. Denies dysuria, gross hematuria. No abdominal pain, nausea, vomiting or diarrhea.     Objective:   Physical Exam  General -- alert, well-developed, and overweight appearing. No apparent distress.  Lungs --  decreased breath sounds otherwise clear, no wheezing or crackles   Heart-- normal rate,  regular rhythm, no murmur, and no gallop.   Abdomen--soft, non-tender, no distention   Extremities-- no pretibial edema bilaterally  Neurologic-- alert & oriented X3 and strength normal in all extremities. Psych-- Cognition and judgment appear intact. Alert and cooperative with normal attention span and concentration.  not anxious appearing and not depressed appearing.       Assessment & Plan:

## 2011-12-08 LAB — URINALYSIS, MICROSCOPIC ONLY
Bacteria, UA: NONE SEEN
Casts: NONE SEEN
Crystals: NONE SEEN

## 2011-12-08 LAB — URINALYSIS, ROUTINE W REFLEX MICROSCOPIC
Bilirubin Urine: NEGATIVE
Ketones, ur: NEGATIVE mg/dL
Specific Gravity, Urine: 1.01 (ref 1.005–1.030)
Urobilinogen, UA: 0.2 mg/dL (ref 0.0–1.0)
pH: 5.5 (ref 5.0–8.0)

## 2011-12-09 ENCOUNTER — Encounter: Payer: Self-pay | Admitting: Internal Medicine

## 2011-12-09 NOTE — Assessment & Plan Note (Signed)
S/p AoV R 08-2011

## 2011-12-10 ENCOUNTER — Encounter (HOSPITAL_COMMUNITY)
Admission: RE | Admit: 2011-12-10 | Discharge: 2011-12-10 | Disposition: A | Discharge status: Home / Self Care | Payer: Medicare Other | Source: Ambulatory Visit | Attending: Cardiovascular Disease | Admitting: Cardiovascular Disease

## 2011-12-11 ENCOUNTER — Other Ambulatory Visit (INDEPENDENT_AMBULATORY_CARE_PROVIDER_SITE_OTHER): Payer: Medicare Other

## 2011-12-11 DIAGNOSIS — I2510 Atherosclerotic heart disease of native coronary artery without angina pectoris: Secondary | ICD-10-CM

## 2011-12-11 DIAGNOSIS — E119 Type 2 diabetes mellitus without complications: Secondary | ICD-10-CM

## 2011-12-11 DIAGNOSIS — N390 Urinary tract infection, site not specified: Secondary | ICD-10-CM

## 2011-12-11 LAB — CBC WITH DIFFERENTIAL/PLATELET
Basophils Absolute: 0.1 10*3/uL (ref 0.0–0.1)
Eosinophils Absolute: 0.3 10*3/uL (ref 0.0–0.7)
HCT: 42.9 % (ref 39.0–52.0)
Hemoglobin: 14.1 g/dL (ref 13.0–17.0)
Lymphs Abs: 1.6 10*3/uL (ref 0.7–4.0)
MCHC: 32.8 g/dL (ref 30.0–36.0)
Monocytes Relative: 8 % (ref 3.0–12.0)
Neutro Abs: 5.2 10*3/uL (ref 1.4–7.7)
RDW: 15.1 % — ABNORMAL HIGH (ref 11.5–14.6)

## 2011-12-11 LAB — LIPID PANEL
Total CHOL/HDL Ratio: 4
VLDL: 35.4 mg/dL (ref 0.0–40.0)

## 2011-12-11 LAB — COMPREHENSIVE METABOLIC PANEL
ALT: 8 U/L (ref 0–53)
AST: 21 U/L (ref 0–37)
Creatinine, Ser: 0.9 mg/dL (ref 0.4–1.5)
Total Bilirubin: 1.1 mg/dL (ref 0.3–1.2)

## 2011-12-11 LAB — URINALYSIS
Bilirubin Urine: NEGATIVE
Ketones, ur: NEGATIVE
Leukocytes, UA: NEGATIVE
pH: 7 (ref 5.0–8.0)

## 2011-12-11 LAB — MICROALBUMIN / CREATININE URINE RATIO: Creatinine,U: 48.4 mg/dL

## 2011-12-11 LAB — AST: AST: 21 U/L (ref 0–37)

## 2011-12-11 LAB — ALT: ALT: 8 U/L (ref 0–53)

## 2011-12-11 LAB — URINE CULTURE

## 2011-12-11 NOTE — Progress Notes (Signed)
Labs only

## 2011-12-12 ENCOUNTER — Encounter (HOSPITAL_COMMUNITY)
Admission: RE | Admit: 2011-12-12 | Discharge: 2011-12-12 | Disposition: A | Discharge status: Home / Self Care | Payer: Medicare Other | Source: Ambulatory Visit | Attending: Cardiovascular Disease | Admitting: Cardiovascular Disease

## 2011-12-14 ENCOUNTER — Encounter (HOSPITAL_COMMUNITY)
Admission: RE | Admit: 2011-12-14 | Discharge: 2011-12-14 | Disposition: A | Discharge status: Home / Self Care | Payer: Medicare Other | Source: Ambulatory Visit | Attending: Cardiovascular Disease | Admitting: Cardiovascular Disease

## 2011-12-14 MED ORDER — CIPROFLOXACIN HCL 500 MG PO TABS: 500.0000 mg | ORAL_TABLET | Freq: Two times a day (BID) | ORAL | Status: AC

## 2011-12-14 NOTE — Addendum Note (Signed)
Addended by: Edwena Felty T on: 12/14/2011 10:48 AM   Modules accepted: Orders

## 2011-12-16 ENCOUNTER — Other Ambulatory Visit: Payer: Self-pay | Admitting: Internal Medicine

## 2011-12-17 ENCOUNTER — Encounter (HOSPITAL_COMMUNITY)
Admission: RE | Admit: 2011-12-17 | Discharge: 2011-12-17 | Disposition: A | Discharge status: Home / Self Care | Payer: Medicare Other | Source: Ambulatory Visit | Attending: Cardiovascular Disease | Admitting: Cardiovascular Disease

## 2011-12-17 NOTE — Telephone Encounter (Signed)
Spoke with pt & he stated that he does not need a refill at this time.

## 2011-12-19 ENCOUNTER — Encounter (HOSPITAL_COMMUNITY)
Admission: RE | Admit: 2011-12-19 | Discharge: 2011-12-19 | Disposition: A | Discharge status: Home / Self Care | Payer: Medicare Other | Source: Ambulatory Visit | Attending: Cardiovascular Disease | Admitting: Cardiovascular Disease

## 2011-12-19 NOTE — Progress Notes (Signed)
Blood pressure was noted at 204/66 on the walking track.  Fernando Moyer says he was walking at a faster pace then normal other wise no other complaints voiced.  Exercise stopped.  Repeat blood pressure 152/70 then 158/70 sitting. Fernando Moyer says he took his medications as prescribed this morning.  Dr Cooper's office called and notified about elevated blood pressure. Exit blood pressure 148/70. Today's blood  Pressures faxed to Dr Earmon Phoenix office for review. Will send complete exercise flow sheets for Dr Excell Seltzer to review via Careers information officer.

## 2011-12-21 ENCOUNTER — Encounter (HOSPITAL_COMMUNITY)
Admission: RE | Admit: 2011-12-21 | Discharge: 2011-12-21 | Disposition: A | Discharge status: Home / Self Care | Payer: Medicare Other | Source: Ambulatory Visit | Attending: Cardiovascular Disease | Admitting: Cardiovascular Disease

## 2011-12-21 NOTE — Addendum Note (Signed)
Addended by: Edwena Felty T on: 12/21/2011 04:29 PM   Modules accepted: Orders

## 2011-12-21 NOTE — Progress Notes (Signed)
[place] entry blood pressure was 120/60.  Blood pressure was noted at 172/70 on the airdyne.  Patient asymptomatic and switched to the track.  Repeat blood pressure 158/70.  Patient was able to complete  Exercise without further incident. Recheck blood pressure 128/60.  Today's blood pressure faxed to Dr Earmon Phoenix office for review.

## 2011-12-24 ENCOUNTER — Encounter (HOSPITAL_COMMUNITY)
Admission: RE | Admit: 2011-12-24 | Discharge: 2011-12-24 | Disposition: A | Discharge status: Home / Self Care | Payer: Medicare Other | Source: Ambulatory Visit | Attending: Cardiovascular Disease | Admitting: Cardiovascular Disease

## 2011-12-26 ENCOUNTER — Other Ambulatory Visit: Payer: Self-pay

## 2011-12-26 ENCOUNTER — Encounter (HOSPITAL_COMMUNITY): Payer: Medicare Other

## 2011-12-26 DIAGNOSIS — M5137 Other intervertebral disc degeneration, lumbosacral region: Secondary | ICD-10-CM | POA: Diagnosis not present

## 2011-12-26 NOTE — Telephone Encounter (Signed)
Refill request on pain medications. No name left        KP

## 2011-12-26 NOTE — Telephone Encounter (Signed)
LMOVM to return call & verify pain med.

## 2011-12-27 ENCOUNTER — Other Ambulatory Visit: Payer: Self-pay

## 2011-12-27 NOTE — Telephone Encounter (Signed)
LMOVM to return call.

## 2011-12-27 NOTE — Telephone Encounter (Signed)
Pt requesting refill need okay and quantity. Plz advise       MW

## 2011-12-27 NOTE — Telephone Encounter (Signed)
Fernando Moyer called again concerning pt. Plz call back [phone]        MW

## 2011-12-27 NOTE — Telephone Encounter (Signed)
Patient's daughter returning a call to [facility] regarding her father's pain medication refill.  She was able to confirm that it was the Oxycodone he needed refilled.  I told her I would give this information to [place].  Patient's daughter agreed.  I then confirmed with the Lillia Abed that Oxycodone was the correct med to refill.  Lillia Abed acknowledged and agreed to take care of the situation.

## 2011-12-27 NOTE — Telephone Encounter (Signed)
Ok to refill oxycodone 5mg . Take 1 tablet every 4 hrs prn. #60. Last filled 9.6.13. Last OV 9.6.13.

## 2011-12-27 NOTE — Telephone Encounter (Signed)
LMOVM for pt to return call 

## 2011-12-27 NOTE — Telephone Encounter (Signed)
got 60 tablets 20 days ago, taking one tablet 3 times a day. I would prefer him to take less medication Please discuss with the patient , I like him to back to Percocet 5/325 twice a day. Let me know if he is in agreement

## 2011-12-28 ENCOUNTER — Other Ambulatory Visit: Payer: Self-pay

## 2011-12-28 ENCOUNTER — Encounter (HOSPITAL_COMMUNITY)
Admission: RE | Admit: 2011-12-28 | Discharge: 2011-12-28 | Disposition: A | Discharge status: Home / Self Care | Payer: Medicare Other | Source: Ambulatory Visit | Attending: Cardiovascular Disease | Admitting: Cardiovascular Disease

## 2011-12-28 MED ORDER — OXYCODONE-ACETAMINOPHEN 5-325 MG PO TABS: 1.0000 | ORAL_TABLET | ORAL | Status: DC | PRN

## 2011-12-28 NOTE — Addendum Note (Signed)
Addended by: Edwena Felty T on: 12/28/2011 02:01 PM   Modules accepted: Orders, Medications

## 2011-12-28 NOTE — Telephone Encounter (Signed)
LMOVM to return call.

## 2011-12-28 NOTE — Telephone Encounter (Signed)
Spoke with pt's daughter & she states that would be fine to go back to percocet 5-325. She is aware rx is ready to be picked up.

## 2011-12-28 NOTE — Telephone Encounter (Signed)
Rx needs approval.  Plz advise     MW

## 2011-12-31 ENCOUNTER — Encounter (HOSPITAL_COMMUNITY)
Admission: RE | Admit: 2011-12-31 | Discharge: 2011-12-31 | Disposition: A | Discharge status: Home / Self Care | Payer: Medicare Other | Source: Ambulatory Visit | Attending: Cardiovascular Disease | Admitting: Cardiovascular Disease

## 2011-12-31 DIAGNOSIS — I2789 Other specified pulmonary heart diseases: Secondary | ICD-10-CM | POA: Diagnosis not present

## 2011-12-31 DIAGNOSIS — I4891 Unspecified atrial fibrillation: Secondary | ICD-10-CM | POA: Diagnosis not present

## 2011-12-31 DIAGNOSIS — G609 Hereditary and idiopathic neuropathy, unspecified: Secondary | ICD-10-CM | POA: Diagnosis not present

## 2011-12-31 DIAGNOSIS — Z7982 Long term (current) use of aspirin: Secondary | ICD-10-CM | POA: Diagnosis not present

## 2011-12-31 DIAGNOSIS — I442 Atrioventricular block, complete: Secondary | ICD-10-CM | POA: Diagnosis not present

## 2011-12-31 DIAGNOSIS — I739 Peripheral vascular disease, unspecified: Secondary | ICD-10-CM | POA: Diagnosis not present

## 2011-12-31 DIAGNOSIS — I359 Nonrheumatic aortic valve disorder, unspecified: Secondary | ICD-10-CM | POA: Diagnosis not present

## 2011-12-31 DIAGNOSIS — I10 Essential (primary) hypertension: Secondary | ICD-10-CM | POA: Diagnosis not present

## 2011-12-31 DIAGNOSIS — Z87891 Personal history of nicotine dependence: Secondary | ICD-10-CM | POA: Diagnosis not present

## 2011-12-31 DIAGNOSIS — I714 Abdominal aortic aneurysm, without rupture: Secondary | ICD-10-CM | POA: Diagnosis not present

## 2011-12-31 DIAGNOSIS — Z954 Presence of other heart-valve replacement: Secondary | ICD-10-CM | POA: Diagnosis not present

## 2011-12-31 DIAGNOSIS — K219 Gastro-esophageal reflux disease without esophagitis: Secondary | ICD-10-CM | POA: Diagnosis not present

## 2011-12-31 DIAGNOSIS — I2510 Atherosclerotic heart disease of native coronary artery without angina pectoris: Secondary | ICD-10-CM | POA: Diagnosis not present

## 2011-12-31 DIAGNOSIS — Z5189 Encounter for other specified aftercare: Secondary | ICD-10-CM | POA: Diagnosis not present

## 2011-12-31 DIAGNOSIS — E785 Hyperlipidemia, unspecified: Secondary | ICD-10-CM | POA: Diagnosis not present

## 2011-12-31 DIAGNOSIS — Z79899 Other long term (current) drug therapy: Secondary | ICD-10-CM | POA: Diagnosis not present

## 2011-12-31 DIAGNOSIS — J449 Chronic obstructive pulmonary disease, unspecified: Secondary | ICD-10-CM | POA: Diagnosis not present

## 2011-12-31 DIAGNOSIS — I6529 Occlusion and stenosis of unspecified carotid artery: Secondary | ICD-10-CM | POA: Diagnosis not present

## 2011-12-31 DIAGNOSIS — I252 Old myocardial infarction: Secondary | ICD-10-CM | POA: Diagnosis not present

## 2012-01-01 ENCOUNTER — Telehealth: Payer: Self-pay | Admitting: Cardiovascular Disease

## 2012-01-01 NOTE — Telephone Encounter (Signed)
I spoke with Fernando Moyer and made her aware that Fernando Moyer is aware of the pt's BP readings in cardiac rehab.  At this time Fernando Moyer has not recommended medication changes.  The pt is scheduled to see Fernando Moyer on 01/11/12 and we will discuss BP further at that time.

## 2012-01-01 NOTE — Telephone Encounter (Signed)
Left message to call back  

## 2012-01-01 NOTE — Telephone Encounter (Signed)
New Problem:    Patient's daughter called in to follow up on Cardiac rehab's request to consult with someone about the patient's spiking BP during rehab.  Please call back.

## 2012-01-02 ENCOUNTER — Encounter (HOSPITAL_COMMUNITY)
Admission: RE | Admit: 2012-01-02 | Discharge: 2012-01-02 | Disposition: A | Discharge status: Home / Self Care | Payer: Medicare Other | Source: Ambulatory Visit | Attending: Cardiovascular Disease | Admitting: Cardiovascular Disease

## 2012-01-02 DIAGNOSIS — G609 Hereditary and idiopathic neuropathy, unspecified: Secondary | ICD-10-CM | POA: Diagnosis not present

## 2012-01-02 DIAGNOSIS — J449 Chronic obstructive pulmonary disease, unspecified: Secondary | ICD-10-CM | POA: Diagnosis not present

## 2012-01-02 DIAGNOSIS — I739 Peripheral vascular disease, unspecified: Secondary | ICD-10-CM | POA: Insufficient documentation

## 2012-01-02 DIAGNOSIS — I714 Abdominal aortic aneurysm, without rupture, unspecified: Secondary | ICD-10-CM | POA: Insufficient documentation

## 2012-01-02 DIAGNOSIS — Z954 Presence of other heart-valve replacement: Secondary | ICD-10-CM | POA: Insufficient documentation

## 2012-01-02 DIAGNOSIS — J4489 Other specified chronic obstructive pulmonary disease: Secondary | ICD-10-CM | POA: Insufficient documentation

## 2012-01-02 DIAGNOSIS — I359 Nonrheumatic aortic valve disorder, unspecified: Secondary | ICD-10-CM | POA: Diagnosis not present

## 2012-01-02 DIAGNOSIS — I2789 Other specified pulmonary heart diseases: Secondary | ICD-10-CM | POA: Insufficient documentation

## 2012-01-02 DIAGNOSIS — I6529 Occlusion and stenosis of unspecified carotid artery: Secondary | ICD-10-CM | POA: Insufficient documentation

## 2012-01-02 DIAGNOSIS — Z79899 Other long term (current) drug therapy: Secondary | ICD-10-CM | POA: Diagnosis not present

## 2012-01-02 DIAGNOSIS — I2510 Atherosclerotic heart disease of native coronary artery without angina pectoris: Secondary | ICD-10-CM | POA: Insufficient documentation

## 2012-01-02 DIAGNOSIS — I442 Atrioventricular block, complete: Secondary | ICD-10-CM | POA: Insufficient documentation

## 2012-01-02 DIAGNOSIS — I10 Essential (primary) hypertension: Secondary | ICD-10-CM | POA: Diagnosis not present

## 2012-01-02 DIAGNOSIS — E785 Hyperlipidemia, unspecified: Secondary | ICD-10-CM | POA: Diagnosis not present

## 2012-01-02 DIAGNOSIS — Z5189 Encounter for other specified aftercare: Secondary | ICD-10-CM | POA: Insufficient documentation

## 2012-01-02 DIAGNOSIS — K219 Gastro-esophageal reflux disease without esophagitis: Secondary | ICD-10-CM | POA: Insufficient documentation

## 2012-01-02 DIAGNOSIS — Z7982 Long term (current) use of aspirin: Secondary | ICD-10-CM | POA: Diagnosis not present

## 2012-01-02 DIAGNOSIS — I252 Old myocardial infarction: Secondary | ICD-10-CM | POA: Diagnosis not present

## 2012-01-02 DIAGNOSIS — Z87891 Personal history of nicotine dependence: Secondary | ICD-10-CM | POA: Diagnosis not present

## 2012-01-02 DIAGNOSIS — I4891 Unspecified atrial fibrillation: Secondary | ICD-10-CM | POA: Insufficient documentation

## 2012-01-02 MED ORDER — OXYCODONE HCL 5 MG PO TABS: 5.0000 mg | ORAL_TABLET | ORAL | Status: DC | PRN

## 2012-01-02 NOTE — Telephone Encounter (Signed)
Done

## 2012-01-04 ENCOUNTER — Encounter (HOSPITAL_COMMUNITY)
Admission: RE | Admit: 2012-01-04 | Discharge: 2012-01-04 | Disposition: A | Discharge status: Home / Self Care | Payer: Medicare Other | Source: Ambulatory Visit | Attending: Cardiovascular Disease | Admitting: Cardiovascular Disease

## 2012-01-07 ENCOUNTER — Encounter (HOSPITAL_COMMUNITY): Payer: Self-pay | Admitting: *Deleted

## 2012-01-07 ENCOUNTER — Encounter (HOSPITAL_COMMUNITY): Payer: Medicare Other

## 2012-01-07 NOTE — Telephone Encounter (Signed)
New problem:  Patient at cardiac rehab now . C/o b/p issues  180/90.

## 2012-01-07 NOTE — Telephone Encounter (Signed)
I spoke with Byrd Hesselbach at Cardiac Rehab and the pt's BP on arrival was 180/90.  She rechecked the pt's BP and it was 142/70, recheck again 124/70.  Byrd Hesselbach did not let the pt exercise today due to elevated BP.  She said the pt also had a headache over the weekend.  I made Byrd Hesselbach aware that the pt needs to start checking his BP three times a day at home due to fluctuations and bring readings into his appointment on Friday.  Byrd Hesselbach will make the pt aware of this plan.  The pt will try to participate in cardiac rehab on Wednesday.

## 2012-01-08 ENCOUNTER — Encounter (HOSPITAL_COMMUNITY): Payer: Self-pay | Admitting: *Deleted

## 2012-01-08 NOTE — Progress Notes (Signed)
[place] entry blood pressure was 182/90 recheck blood pressure 178/90  Esco reporting feeling bad over the weekend and having a headache.  Had patient to sit down recheck blood pressure 142/70.  No exercise today due to elevated blood pressure.  Temperature 97.8.  Dr Earmon Phoenix office called and notified spoke with Leotis Shames.  Recheck blood pressure 124/60.  Will continue to monitor the patient throughout  the program. Jamaree has a follow up appointment with Dr Excell Seltzer this Friday. Mr Brunsvold does not have a blood pressure monitor at home. Patient encouraged to check blood pressure at drug store near home on Tuesday.  Mycheal will return to exercise on Wednesday if he feels okay. . Will send exercise flow sheets for review via media manager with today's blood pressure for Dr Excell Seltzer.

## 2012-01-08 NOTE — Progress Notes (Unsigned)
Subjective:      Patient ID: Fernando Moyer is a 78 y.o. male.  Chief Complaint: HPI {Common ambulatory SmartLinks:19316} ROS    Objective:    Physical Exam  Lab Review:  {Recent labs:19471::"not applicable"}    Assessment:     No diagnosis found.   Plan:     ***

## 2012-01-09 ENCOUNTER — Encounter (HOSPITAL_COMMUNITY)
Admission: RE | Admit: 2012-01-09 | Discharge: 2012-01-09 | Disposition: A | Discharge status: Home / Self Care | Payer: Medicare Other | Source: Ambulatory Visit | Attending: Cardiovascular Disease | Admitting: Cardiovascular Disease

## 2012-01-11 ENCOUNTER — Encounter (HOSPITAL_COMMUNITY)
Admission: RE | Admit: 2012-01-11 | Discharge: 2012-01-11 | Disposition: A | Discharge status: Home / Self Care | Payer: Medicare Other | Source: Ambulatory Visit | Attending: Cardiovascular Disease | Admitting: Cardiovascular Disease

## 2012-01-11 ENCOUNTER — Encounter: Payer: Self-pay | Admitting: Cardiovascular Disease

## 2012-01-11 ENCOUNTER — Ambulatory Visit (INDEPENDENT_AMBULATORY_CARE_PROVIDER_SITE_OTHER): Payer: Medicare Other | Admitting: Cardiovascular Disease

## 2012-01-11 VITALS — BP 126/71 | HR 89 | Wt 202.0 lb

## 2012-01-11 DIAGNOSIS — I359 Nonrheumatic aortic valve disorder, unspecified: Secondary | ICD-10-CM

## 2012-01-11 NOTE — Patient Instructions (Addendum)
Your physician recommends that you continue on your current medications as directed. Please refer to the Current Medication list given to you today.  Your physician recommends that you schedule a follow-up appointment in: 3 MONTHS  

## 2012-01-11 NOTE — Progress Notes (Signed)
HPI:  78 year old gentleman presenting for followup evaluation. The patient underwent bioprosthetic aortic valve replacement with a pericardial valve in May 2013. He's also followed for hypertension and paroxysmal atrial fibrillation.  The patient graduated from cardiac rehabilitation today. He is committed to continue with his walking program. He still doesn't feel very well and is a little discouraged about his slow recovery after surgery. He states "I have good days and bad days." His energy level is not good. The pain around his sternotomy has improved. He denies leg swelling, orthopnea, PND, or palpitations.  Outpatient Encounter Prescriptions as of 01/11/2012  Medication Sig Dispense Refill  . furosemide (LASIX) 40 MG tablet Take 1 tablet (40 mg total) by mouth daily.  30 tablet  6  . omeprazole (PRILOSEC) 20 MG capsule Take 20 mg by mouth daily.       Marland Kitchen oxyCODONE (OXY IR/ROXICODONE) 5 MG immediate release tablet Take 1 tablet (5 mg total) by mouth every 4 (four) hours as needed for pain.  60 tablet  0  . potassium chloride SA (K-DUR,KLOR-CON) 20 MEQ tablet Take 1 tablet (20 mEq total) by mouth daily.  30 tablet  6  . PRADAXA 150 MG CAPS TAKE 1 CAPSULE BY MOUTH EVERY 12 HOURS  180 capsule  3  . pravastatin (PRAVACHOL) 40 MG tablet Take 1 tablet (40 mg total) by mouth daily.  30 tablet  6  . DISCONTD: oxyCODONE-acetaminophen (PERCOCET/ROXICET) 5-325 MG per tablet Take 1 tablet by mouth every 4 (four) hours as needed. For pain  60 tablet  0    No Known Allergies  Past Medical History  Diagnosis Date  . GERD with stricture     w/hx of stricture  . PVD (peripheral vascular disease) 12/07    per cath 12/07....iliac  . Carotid artery occlusion     last u/s 3-11...Marland Kitchenper vasc.surgery 2007 cath: nonobstructive CAD. mil;d MS, trivial AoS, small AAA,  . Bowen's disease     right arm BX: referred to dermatology  . Hyperlipidemia   . Emphysema     PFT 4/10 FVC 3.45 (90%), FEV1 2.72 (106%),  TLC 5.36 (93%), DLCO 74%, NO BD RESPONSE: ? OF PSEUDONORMALIZATION OF PFT FINDINGS W/BOTH OBSTRUCTIVE AND RESTRICTIVE DEFECTS.  Marland Kitchen AAA (abdominal aortic aneurysm) 05/2008    PER CARDIAC CATH  . Aortic stenosis 05/2008    PER CARDIAC CATH, MILD, MITRAL STENOSIS  . Mitral stenosis 05/2008    PER CARDIAC CATH  . Coronary artery disease 05/2008    MILD, MEDICAL MANAGEMENT  . Pulmonary hypertension   . COPD (chronic obstructive pulmonary disease)   . Hypoxemia   . Hyperlipidemia   . Hypertension 02/21/11    pt denies this hx  . Heart murmur   . Dyspnea      w/u included a cath and PFT's, sxms thought to be pulmonary  . Chronic back pain   . Peripheral neuropathy     right thigh; "it's been that way for years"  . Myocardial infarction   . Diabetes mellitus 09/07/2011    ROS: Negative except as per HPI  BP 126/71  Pulse 89  Wt 91.627 kg (202 lb)  PHYSICAL EXAM: Pt is alert and oriented, elderly man in NAD HEENT: Rosacea noted, otherwise normal Neck: JVP - normal, carotids 2+= with bilateral bruits Lungs: CTA bilaterally CV: RRR with grade 2/6 systolic murmur at the left sternal border Abd: soft, NT, Positive BS, obese Ext: no C/C/E Skin: warm/dry no rash  ASSESSMENT AND PLAN: 1.  Aortic stenosis status post bioprosthetic AVR. Stable by exam. I will followup with him in 3 months. His postoperative echo from 10/12/2011 was reviewed.  2. Paroxysmal atrial fibrillation. He is maintaining sinus rhythm. He is anticoagulated with Pradaxa. Follow-up in 3 months.  3. Hyperlipidemia. The patient is on pravastatin 40 mg. His lipids from September were reviewed and showed cholesterol 135, HDL 36, and LDL 64.  Tonny Bollman 01/11/2012 12:13 PM

## 2012-01-11 NOTE — Progress Notes (Signed)
Fernando Moyer graduates today he plans to continue exercise on his own.

## 2012-01-14 ENCOUNTER — Encounter (HOSPITAL_COMMUNITY): Payer: Medicare Other

## 2012-01-16 ENCOUNTER — Encounter (HOSPITAL_COMMUNITY): Payer: Medicare Other

## 2012-01-18 ENCOUNTER — Encounter (HOSPITAL_COMMUNITY): Payer: Medicare Other

## 2012-01-22 ENCOUNTER — Encounter: Payer: Self-pay | Admitting: Cardiovascular Disease

## 2012-01-31 ENCOUNTER — Encounter: Payer: Self-pay | Admitting: *Deleted

## 2012-01-31 ENCOUNTER — Telehealth: Payer: Self-pay | Admitting: Internal Medicine

## 2012-01-31 DIAGNOSIS — N390 Urinary tract infection, site not specified: Secondary | ICD-10-CM

## 2012-01-31 NOTE — Telephone Encounter (Signed)
Called pt, unable to leave a msg. Mailed letter & entered orders.

## 2012-01-31 NOTE — Telephone Encounter (Signed)
Patient did not go for a urology referral (dx was asymptomatic, persistent UTI)  Please advise patient :  come to the office and get a urine culture DX UTI, to be sure her infection has been cleared.

## 2012-02-05 ENCOUNTER — Telehealth: Payer: Self-pay | Admitting: Cardiovascular Disease

## 2012-02-05 ENCOUNTER — Other Ambulatory Visit: Payer: Medicare Other

## 2012-02-05 DIAGNOSIS — N390 Urinary tract infection, site not specified: Secondary | ICD-10-CM | POA: Diagnosis not present

## 2012-02-05 NOTE — Telephone Encounter (Signed)
plz return call to pt grand daughter Efraim Kaufmann [phone] regarding medication hold.

## 2012-02-05 NOTE — Telephone Encounter (Signed)
Left message to call back  

## 2012-02-06 NOTE — Telephone Encounter (Signed)
I spoke with Fernando Moyer and the pt is scheduled for an epidural injection on 02/13/12 with Dr Ethelene Hal.  They would like the pt to hold Pradaxa 5 days prior to injection. Fernando Moyer would like to know if this is okay with Dr Excell Seltzer.  I will forward this message to Dr Excell Seltzer for review.

## 2012-02-07 NOTE — Telephone Encounter (Signed)
This is ok

## 2012-02-07 NOTE — Telephone Encounter (Signed)
I left a message on Fernando Moyer's voicemail that the pt can hold Pradaxa per Dr Excell Seltzer

## 2012-02-13 DIAGNOSIS — M47817 Spondylosis without myelopathy or radiculopathy, lumbosacral region: Secondary | ICD-10-CM | POA: Diagnosis not present

## 2012-03-31 ENCOUNTER — Other Ambulatory Visit: Payer: Self-pay | Admitting: *Deleted

## 2012-03-31 DIAGNOSIS — I359 Nonrheumatic aortic valve disorder, unspecified: Secondary | ICD-10-CM

## 2012-03-31 DIAGNOSIS — I4891 Unspecified atrial fibrillation: Secondary | ICD-10-CM

## 2012-03-31 MED ORDER — POTASSIUM CHLORIDE CRYS ER 20 MEQ PO TBCR: 20.0000 meq | EXTENDED_RELEASE_TABLET | Freq: Every day | ORAL | Status: DC

## 2012-03-31 MED ORDER — FUROSEMIDE 40 MG PO TABS: 40.0000 mg | ORAL_TABLET | Freq: Every day | ORAL | Status: DC

## 2012-04-01 ENCOUNTER — Ambulatory Visit (INDEPENDENT_AMBULATORY_CARE_PROVIDER_SITE_OTHER): Payer: Medicare Other | Admitting: Internal Medicine

## 2012-04-01 VITALS — BP 108/70 | HR 90 | Temp 97.6°F | Wt 210.0 lb

## 2012-04-01 DIAGNOSIS — J438 Other emphysema: Secondary | ICD-10-CM

## 2012-04-01 MED ORDER — AZITHROMYCIN 250 MG PO TABS: ORAL_TABLET | ORAL | Status: DC

## 2012-04-01 NOTE — Assessment & Plan Note (Addendum)
Patient history of COPD presents with a one-week history of respiratory symptoms. No evidence of pneumonia on clinical grounds. Plan: Zithromax, Mucinex. See instructions

## 2012-04-01 NOTE — Progress Notes (Signed)
  Subjective:    Patient ID: Fernando Moyer, male    DOB: [birth year], 78 y.o.   MRN: [id number]  HPI Acute visit Developed respiratory symptoms a week ago: Runny nose, mild sore throat, cough above baseline. Feeling weak. He is producing some gray sputum, no hemoptysis, sputum production is close to baseline.  Past Medical History  Diagnosis Date  . GERD with stricture     w/hx of stricture  . PVD (peripheral vascular disease) 12/07    per cath 12/07....iliac  . Carotid artery occlusion     last u/s 3-11...Marland Kitchenper vasc.surgery 2007 cath: nonobstructive CAD. mil;d MS, trivial AoS, small AAA,  . Bowen's disease     right arm BX: referred to dermatology  . Hyperlipidemia   . Emphysema     PFT 4/10 FVC 3.45 (90%), FEV1 2.72 (106%), TLC 5.36 (93%), DLCO 74%, NO BD RESPONSE: ? OF PSEUDONORMALIZATION OF PFT FINDINGS W/BOTH OBSTRUCTIVE AND RESTRICTIVE DEFECTS.  Marland Kitchen AAA (abdominal aortic aneurysm) 05/2008    PER CARDIAC CATH  . Aortic stenosis 05/2008    PER CARDIAC CATH, MILD, MITRAL STENOSIS  . Mitral stenosis 05/2008    PER CARDIAC CATH  . Coronary artery disease 05/2008    MILD, MEDICAL MANAGEMENT  . Pulmonary hypertension   . Hypertension 02/21/11    pt denies this hx  . Dyspnea      w/u included a cath and PFT's, sxms thought to be pulmonary  . Chronic back pain   . Peripheral neuropathy     right thigh; "it's been that way for years"  . Diabetes mellitus 09/07/2011   Past Surgical History  Procedure Date  . Appendectomy   . Esophageal dilation   . Cardiac catheterization 02/27/06    DR. DOWNEY: CORONARY ANGIOGRAPHY, ABDOMINAL AORTOGRAPHY  . Endarterectomy 08-2010    Right carotid endarterectomy    . Aortic valve replacement 08/17/2011    Procedure: AORTIC VALVE REPLACEMENT (AVR);  Surgeon: Alleen Borne, MD;  Location: [facility] OR;  Service: Open Heart Surgery;  Laterality: N/A;   Review of Systems No fever or chills Short of breath slightly above baseline. Chest pain with cough at  the site of open heart surgery. Some chest congestion. No lower extremity edema.     Objective:   Physical Exam General -- alert, well-developed,NAD, VSS.   HEENT -- TMs normal, throat w/o redness, face symmetric and not tender to palpation, nose is slightly congested  Lungs -- normal respiratory effort, no intercostal retractions, no accessory muscle use, and no crackles or wheezing. Rhonchi with cough bilaterally, mild. Heart-- normal rate, regular rhythm, no murmur, and no gallop.   Extremities-- no pretibial edema bilaterally Psych-- Cognition and judgment appear intact. Alert and cooperative with normal attention span and concentration.  not anxious appearing and not depressed appearing.  Seems in good spirits      Assessment & Plan:

## 2012-04-01 NOTE — Patient Instructions (Addendum)
Rest, fluids , tylenol For cough, take Mucinex DM twice a day as needed   Take the antibiotic as prescribed  (zithromax ) Call if no better in few days Call anytime if the symptoms are severe  

## 2012-04-03 ENCOUNTER — Encounter: Payer: Self-pay | Admitting: Internal Medicine

## 2012-04-04 ENCOUNTER — Ambulatory Visit: Payer: Medicare Other | Admitting: Internal Medicine

## 2012-04-08 ENCOUNTER — Encounter: Payer: Self-pay | Admitting: Vascular Surgery

## 2012-04-09 ENCOUNTER — Ambulatory Visit (INDEPENDENT_AMBULATORY_CARE_PROVIDER_SITE_OTHER): Payer: Medicare Other | Admitting: Vascular Surgery

## 2012-04-09 ENCOUNTER — Other Ambulatory Visit: Payer: Self-pay | Admitting: *Deleted

## 2012-04-09 ENCOUNTER — Encounter: Payer: Self-pay | Admitting: Vascular Surgery

## 2012-04-09 ENCOUNTER — Ambulatory Visit (INDEPENDENT_AMBULATORY_CARE_PROVIDER_SITE_OTHER): Payer: Medicare Other | Admitting: *Deleted

## 2012-04-09 VITALS — BP 148/76 | HR 78 | Resp 18 | Ht 67.0 in | Wt 210.0 lb

## 2012-04-09 DIAGNOSIS — I6529 Occlusion and stenosis of unspecified carotid artery: Secondary | ICD-10-CM

## 2012-04-09 DIAGNOSIS — Z48812 Encounter for surgical aftercare following surgery on the circulatory system: Secondary | ICD-10-CM

## 2012-04-09 NOTE — Progress Notes (Addendum)
[facility] [place]  Patient name: Fernando Moyer MRN: [id number] DOB: 08-15-1933 Sex: male  REASON FOR VISIT: follow up after previous right carotid endarterectomy.  HPI: Fernando Moyer is a 79 y.o. male who underwent a right carotid endarterectomy in May of 2012 for a greater than 80% right carotid stenosis. We have been following a moderate left carotid stenosis. Since I saw him last, he denies any history of stroke, TIAs, expressive or receptive aphasia. He did have some slight blurred vision in his right eye which was transient but I do not get any clear-cut history of amaurosis fugax. He denies any significant change in his medical history.   REVIEW OF SYSTEMS: Arly.Keller ] denotes positive finding; [  ] denotes negative finding  CARDIOVASCULAR:  Arly.Keller ] chest pain- the sounds like musculoskeletal pain related to his previous median sternotomy.    Arly.Keller ] dyspnea on exertion    CONSTITUTIONAL:  [ ]  fever   [ ]  chills  PHYSICAL EXAM: Filed Vitals:   04/09/12 1336  BP: 148/76  Pulse: 78  Resp: 18  Height: 5\' 7"  (1.702 m)  Weight: 210 lb (95.255 kg)   Body mass index is 32.89 kg/(m^2). GENERAL: The patient is a well-nourished male, in no acute distress. The vital signs are documented above. CARDIOVASCULAR: There is a regular rate and rhythm. He has a left carotid bruit. PULMONARY: There is good air exchange bilaterally without wheezing or rales. NEURO: No focal weakness or paresthesias.  I have independently interpreted his carotid duplex scan which shows no evidence of recurrent carotid stenosis on the right.he has an approximately 40% left internal carotid artery stenosis. In addition retrograde flow is noted in the left vertebral artery. Systolic pressure in the right arm is 188 the systolic pressure in the left arm is 140.  MEDICAL ISSUES:  CAROTID ARTERY DISEASE The patient's right carotid endarterectomy site is widely patent. There is a moderate 40-59% left  carotid stenosis which is stable. I've ordered a follow carotid duplex scan in 1 year and I'll see him back at that time. He knows to call sooner if he has problems. In the meantime he knows to continue taking his aspirin.   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 He denies any significant problems with dizziness.  DICKSON,CHRISTOPHER S [facility] [place] Beeper: [phone]

## 2012-04-09 NOTE — Assessment & Plan Note (Signed)
The patient's right carotid endarterectomy site is widely patent. There is a moderate 40-59% left carotid stenosis which is stable. I've ordered a follow carotid duplex scan in 1 year and I'll see him back at that time. He knows to call sooner if he has problems. In the meantime he knows to continue taking his aspirin.

## 2012-04-10 ENCOUNTER — Telehealth: Payer: Self-pay | Admitting: *Deleted

## 2012-04-10 NOTE — Telephone Encounter (Signed)
Pt granddaughter left VM that Pt is no better.  She notes that she called the other day and spoke with Dois Davenport who was suppose to get with Dr Drue Novel but she has yet to hear anything. Left message to call office to get further detail on Pt symptoms.

## 2012-04-10 NOTE — Telephone Encounter (Signed)
Pt still having a lot congestion, dry cough with no improvement in symptoms since OV. Pt denies any fever. Please advise   [facility] [address], ok to leave detail message.

## 2012-04-11 MED ORDER — PREDNISONE 10 MG PO TABS: ORAL_TABLET | ORAL | Status: DC

## 2012-04-11 NOTE — Telephone Encounter (Signed)
Left detailed msg on pt's vmail.  

## 2012-04-11 NOTE — Telephone Encounter (Signed)
Continue with Mucinex DM or similar medication OTC. I sent a prescription for prednisone for a few days. Office visit next week if not better, UC if worse during the weekend

## 2012-04-17 ENCOUNTER — Encounter: Payer: Self-pay | Admitting: Lab

## 2012-04-18 ENCOUNTER — Encounter: Payer: Self-pay | Admitting: Internal Medicine

## 2012-04-18 ENCOUNTER — Ambulatory Visit (INDEPENDENT_AMBULATORY_CARE_PROVIDER_SITE_OTHER): Payer: Medicare Other | Admitting: Internal Medicine

## 2012-04-18 VITALS — BP 128/64 | HR 83 | Temp 97.5°F | Ht 67.5 in | Wt 210.0 lb

## 2012-04-18 DIAGNOSIS — Z23 Encounter for immunization: Secondary | ICD-10-CM

## 2012-04-18 DIAGNOSIS — E785 Hyperlipidemia, unspecified: Secondary | ICD-10-CM

## 2012-04-18 DIAGNOSIS — E119 Type 2 diabetes mellitus without complications: Secondary | ICD-10-CM | POA: Diagnosis not present

## 2012-04-18 DIAGNOSIS — N400 Enlarged prostate without lower urinary tract symptoms: Secondary | ICD-10-CM | POA: Diagnosis not present

## 2012-04-18 DIAGNOSIS — Z79899 Other long term (current) drug therapy: Secondary | ICD-10-CM | POA: Diagnosis not present

## 2012-04-18 DIAGNOSIS — Z0000 Encounter for general adult medical examination without abnormal findings: Secondary | ICD-10-CM | POA: Diagnosis not present

## 2012-04-18 DIAGNOSIS — J438 Other emphysema: Secondary | ICD-10-CM | POA: Diagnosis not present

## 2012-04-18 LAB — POCT URINALYSIS DIPSTICK
Ketones, UA: NEGATIVE
Protein, UA: 30
Spec Grav, UA: 1.015
Urobilinogen, UA: 0.2
pH, UA: 6

## 2012-04-18 LAB — ALT: ALT: 12 U/L (ref 0–53)

## 2012-04-18 LAB — BASIC METABOLIC PANEL
BUN: 16 mg/dL (ref 6–23)
Chloride: 104 mEq/L (ref 96–112)
GFR: 89.3 mL/min (ref >60.00)
Potassium: 4 mEq/L (ref 3.5–5.1)

## 2012-04-18 LAB — HEMOGLOBIN A1C: Hgb A1c MFr Bld: 6.4 % (ref 4.6–6.5)

## 2012-04-18 LAB — MICROALBUMIN / CREATININE URINE RATIO
Microalb Creat Ratio: 0.9 mg/g (ref 0.0–30.0)
Microalb, Ur: 1.8 mg/dL (ref 0.0–1.9)

## 2012-04-18 MED ORDER — ZOSTER VACCINE LIVE 19400 UNT/0.65ML ~~LOC~~ SOLR: 0.6500 mL | Freq: Once | SUBCUTANEOUS | Status: DC

## 2012-04-18 MED ORDER — OXYCODONE HCL 5 MG PO TABS: 5.0000 mg | ORAL_TABLET | ORAL | Status: DC | PRN

## 2012-04-18 NOTE — Assessment & Plan Note (Signed)
Borderline diabetes, check a hemoglobin A1c and microalbumin

## 2012-04-18 NOTE — Progress Notes (Signed)
Subjective:    Patient ID: Fernando Moyer, male    DOB: 03/18/1934, 79 y.o.   MRN: [id number]  HPI Here w/ his G-daughter Here for Medicare AWV: 1. Risk factors based on Past M, S, F history: reviewed 2. 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 Depression/mood: no problems noted or reported   4. Hearing: chronic decreased hearing and B tinnitus, s/p multiple ENT evals,unchanged 5. ADL's:  Still drives, independent   6. Fall Risk: no recent falls, see instructions 7. home Safety: does feel safe at home   8. Height, weight, &visual acuity: see VS, vision ok 9. Counseling: provided 10. Labs ordered based on risk factors: if needed   11. Referral Coordination: if needed 12.  Care Plan, see assessment and plan   13.   Cognitive Assessment: Motor skills and cognition seemed intact  In addition, today we discussed the following: CV-- noted from cards and CV surgery reviewed, stable Borderline DM-- on diet only, no amb CBGs COPD-- s/p exacerbation, took Zithromax and prednisone. Overall better, cough has decreased. Still feeling somewhat weak. Chest pain, continue with pain at the site of the surgical incision for open heart surgery somehow worse after the COPD exacerbation. Taking a medication as needed.  Past Medical History  Diagnosis Date  . GERD with stricture     w/hx of stricture  . PVD (peripheral vascular disease) 12/07    per cath 12/07....iliac  . Carotid artery occlusion     last u/s 3-11...Marland Kitchenper vasc.surgery 2007 cath: nonobstructive CAD. mil;d MS, trivial AoS, small AAA,  . Bowen's disease     right arm BX: referred to dermatology  . Hyperlipidemia   . Emphysema     PFT 4/10 FVC 3.45 (90%), FEV1 2.72 (106%), TLC 5.36 (93%), DLCO 74%, NO BD RESPONSE: ? OF PSEUDONORMALIZATION OF PFT FINDINGS W/BOTH OBSTRUCTIVE AND RESTRICTIVE DEFECTS.  Marland Kitchen AAA (abdominal aortic aneurysm)  05/2008    PER CARDIAC CATH  . Aortic stenosis 05/2008    PER CARDIAC CATH, MILD, MITRAL STENOSIS  . Mitral stenosis 05/2008    PER CARDIAC CATH  . Coronary artery disease 05/2008    MILD, MEDICAL MANAGEMENT  . Pulmonary hypertension   . Hypertension 02/21/11    pt denies this hx  . Dyspnea      w/u included a cath and PFT's, sxms thought to be pulmonary  . Chronic back pain   . Peripheral neuropathy     right thigh; "it's been that way for years"  . Diabetes mellitus 09/07/2011   Past Surgical History  Procedure Date  . Appendectomy   . Esophageal dilation   . Cardiac catheterization 02/27/06    DR. DOWNEY: CORONARY ANGIOGRAPHY, ABDOMINAL AORTOGRAPHY  . Endarterectomy 08-2010    Right carotid endarterectomy    . Aortic valve replacement 08/17/2011    Procedure: AORTIC VALVE REPLACEMENT (AVR);  Surgeon: Alleen Borne, MD;  Location: [facility] OR;  Service: Open Heart Surgery;  Laterality: N/A;   History   Social History  . Marital Status: Widowed    Spouse Name: N/A    Number of Children: 2  . Years of Education: N/A   Occupational History  . 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 Smoking status: Former Smoker -- 2.0  packs/day for 30 years    Types: Cigarettes    Quit date: 04/02/1986  . Smokeless tobacco: Never Used     Comment: quit smoking cigarettes 04/1987  . Alcohol Use: No  . Drug Use: No  . Sexually Active: No   Other Topics Concern  . Not on file   Social History Narrative   LIVES BY HIMSELF --HE ENJOYS  WATCHING SPORTS --HAS 2 GROWN SONS, 5 G-KIDS    Family History  Problem Relation Age of Onset  . Heart attack Father 76    MI  . Diabetes Neg Hx   . Colon cancer Neg Hx   . Prostate cancer Neg Hx   . Anesthesia problems Neg Hx   . Hypotension Neg Hx   . Malignant hyperthermia Neg Hx   . Pseudochol deficiency Neg Hx     Review of Systems Denies nausea, vomiting,  diarrhea. No dysuria, gross hematuria or difficulty urinating. Occasionally has urinary frequency.     Objective:   Physical Exam General -- alert, well-developed    Lungs -- normal respiratory effort, no intercostal retractions, no accessory muscle use, and decreased breath sounds  Heart-- normal rate, regular rhythm, soft syst  murmur, and no gallop.   Abdomen--soft, non-tender, no distention, no masses  Extremities-- no pretibial edema bilaterally Rectal-- No external abnormalities noted. Normal sphincter tone. No rectal masses or tenderness. Brown stool, Hemoccult negative Prostate:   Quite enlarged, nontender, not nodular. Psych-- Cognition and judgment appear intact. Alert and cooperative with normal attention span and concentration.  not anxious appearing and not depressed appearing.      Assessment & Plan:

## 2012-04-18 NOTE — Assessment & Plan Note (Signed)
Status post recent exacerbation, lung exam is very good today, cough has decrease, still has some generalized weakness but otherwise improved. Plan-- Flu shot.

## 2012-04-18 NOTE — Assessment & Plan Note (Addendum)
Td 2012 flu shot today UTD on pneumonia shots Zostavax prescription provided Colon ca screening, s/p 2 colonoscopies before (remotely), no reports but path reports from 2002 showed tubular adenomas, hemoccult  negative today; given age and co morbidities guidelines rec no further testing. Diet and exercise discussed All recent labs reviewed

## 2012-04-18 NOTE — Patient Instructions (Addendum)

## 2012-04-18 NOTE — Assessment & Plan Note (Signed)
Quite enlarged prostate gland, only urinary symptom is urinary frequency. Last year, was diagnosed with a proteus UTI, followup by another UTI after treatment that showed Escherichia coli and Citrobacter. He  was essentially asymptomatic. He was refer to urology but declined. Plan: Check a urine culture, again discussed possible urology referral but he is quite reluctant to go

## 2012-04-20 ENCOUNTER — Encounter: Payer: Self-pay | Admitting: Internal Medicine

## 2012-04-20 NOTE — Assessment & Plan Note (Signed)
Well-controlled, no change 

## 2012-04-25 ENCOUNTER — Encounter: Payer: Self-pay | Admitting: Cardiovascular Disease

## 2012-04-25 ENCOUNTER — Ambulatory Visit (INDEPENDENT_AMBULATORY_CARE_PROVIDER_SITE_OTHER): Payer: Medicare Other | Admitting: Cardiovascular Disease

## 2012-04-25 VITALS — BP 96/58 | HR 83 | Ht 67.0 in | Wt 210.8 lb

## 2012-04-25 DIAGNOSIS — I359 Nonrheumatic aortic valve disorder, unspecified: Secondary | ICD-10-CM | POA: Diagnosis not present

## 2012-04-25 NOTE — Patient Instructions (Addendum)
**Note De-identified Fernando Moyer Obfuscation** Your physician recommends that you continue on your current medications as directed. Please refer to the Current Medication list given to you today.  Your physician wants you to follow-up in: 6 months. You will receive a reminder letter in the mail two months in advance. If you don't receive a letter, please call our office to schedule the follow-up appointment.  

## 2012-04-25 NOTE — Progress Notes (Signed)
HPI:  79 year old gentleman presenting for followup evaluation. The patient has a history of severe aortic stenosis and what underwent bioprosthetic aortic valve replacement in May 2013. He is also followed for hypertension and paroxysmal atrial fibrillation.  The patient continues to have fatigue. He has some shortness of breath with activity but no change of late. He denies chest pain or pressure. He denies edema, orthopnea, or PND. He's been compliant with his medications and reports no adverse effects. He has not had any bleeding problems.  Outpatient Encounter Prescriptions as of 04/25/2012  Medication Sig Dispense Refill  . furosemide (LASIX) 40 MG tablet Take 1 tablet (40 mg total) by mouth daily.  90 tablet  4  . guaiFENesin (MUCINEX) 600 MG 12 hr tablet Take 1,200 mg by mouth 2 (two) times daily.      Marland Kitchen omeprazole (PRILOSEC) 20 MG capsule Take 20 mg by mouth daily.       Marland Kitchen oxyCODONE (OXY IR/ROXICODONE) 5 MG immediate release tablet Take 1 tablet (5 mg total) by mouth every 4 (four) hours as needed for pain.  60 tablet  0  . potassium chloride SA (K-DUR,KLOR-CON) 20 MEQ tablet Take 1 tablet (20 mEq total) by mouth daily.  90 tablet  4  . PRADAXA 150 MG CAPS TAKE 1 CAPSULE BY MOUTH EVERY 12 HOURS  180 capsule  3  . pravastatin (PRAVACHOL) 40 MG tablet Take 1 tablet (40 mg total) by mouth daily.  30 tablet  6  . zoster vaccine live, PF, (ZOSTAVAX) 45409 UNT/0.65ML injection Inject 19,400 Units into the skin once.  1 each  0    No Known Allergies  Past Medical History  Diagnosis Date  . GERD with stricture     w/hx of stricture  . PVD (peripheral vascular disease) 12/07    per cath 12/07....iliac  . Carotid artery occlusion     last u/s 3-11...Marland Kitchenper vasc.surgery 2007 cath: nonobstructive CAD. mil;d MS, trivial AoS, small AAA,  . Bowen's disease     right arm BX: referred to dermatology  . Hyperlipidemia   . Emphysema     PFT 4/10 FVC 3.45 (90%), FEV1 2.72 (106%), TLC 5.36 (93%),  DLCO 74%, NO BD RESPONSE: ? OF PSEUDONORMALIZATION OF PFT FINDINGS W/BOTH OBSTRUCTIVE AND RESTRICTIVE DEFECTS.  Marland Kitchen AAA (abdominal aortic aneurysm) 05/2008    PER CARDIAC CATH  . Aortic stenosis 05/2008    PER CARDIAC CATH, MILD, MITRAL STENOSIS  . Mitral stenosis 05/2008    PER CARDIAC CATH  . Coronary artery disease 05/2008    MILD, MEDICAL MANAGEMENT  . Pulmonary hypertension   . Hypertension 02/21/11    pt denies this hx  . Dyspnea      w/u included a cath and PFT's, sxms thought to be pulmonary  . Chronic back pain   . Peripheral neuropathy     right thigh; "it's been that way for years"  . Diabetes mellitus 09/07/2011    ROS: Negative except as per HPI  BP 96/58  Pulse 83  Ht 5\' 7"  (1.702 m)  Wt 95.618 kg (210 lb 12.8 oz)  BMI 33.02 kg/m2  PHYSICAL EXAM: Pt is alert and oriented, elderly male in NAD HEENT: normal Neck: JVP - normal, carotids 2+= with bilateral bruits Lungs: CTA bilaterally CV: RRR with a grade 2/6 systolic murmur at the right upper sternal border Abd: soft, NT, Positive BS, no hepatomegaly Ext: no C/C/E, distal pulses intact and equal Skin: warm/dry no rash  ASSESSMENT AND PLAN: 1.  Aortic stenosis. The patient has undergone aortic valve replacement with a bioprosthesis. His exam is stable and his postoperative echo showed a normal functioning prosthesis. He should followup in 6 months.  2. Paroxysmal atrial fibrillation. The patient is anticoagulated with Pradaxa. He remains in sinus rhythm.  3. Chronic dyspnea. Suspect this is multifactorial. No significant change in symptoms.  4. Carotid stenosis. He is followed by Dr. Edilia Bo and was recently seen. He has mild to moderate residual carotid stenosis on the contralateral side from his previous carotid endarterectomy.  Tonny Bollman 04/25/2012 2:08 PM

## 2012-05-01 ENCOUNTER — Telehealth: Payer: Self-pay | Admitting: *Deleted

## 2012-05-01 MED ORDER — OXYCODONE-ACETAMINOPHEN 5-325 MG PO TABS: 1.0000 | ORAL_TABLET | ORAL | Status: DC | PRN

## 2012-05-01 NOTE — Telephone Encounter (Signed)
Pt's granddaughter made aware rx is ready to be picked up at front desk.

## 2012-05-01 NOTE — Telephone Encounter (Signed)
Pt's granddaughter called stating that at the last OV the pt's oxycodone was refilled for immediate release tablets. Pt's granddaughter stated that the immediate release tablets have been giving him headaches. She would like to know if he could have a refill for the regular oxycodone 5-325mg  tab. Please advise.

## 2012-05-01 NOTE — Telephone Encounter (Signed)
He has been prescribed oxycodone lately with good results, in the past he tried oxycodone 08/03/2023 with good results. Plan: Switch to oxycodone with acetaminophen

## 2012-05-08 ENCOUNTER — Encounter: Payer: Self-pay | Admitting: *Deleted

## 2012-05-13 ENCOUNTER — Other Ambulatory Visit: Payer: Medicare Other

## 2012-05-13 DIAGNOSIS — N400 Enlarged prostate without lower urinary tract symptoms: Secondary | ICD-10-CM | POA: Diagnosis not present

## 2012-05-17 ENCOUNTER — Other Ambulatory Visit: Payer: Self-pay | Admitting: Internal Medicine

## 2012-05-17 DIAGNOSIS — N400 Enlarged prostate without lower urinary tract symptoms: Secondary | ICD-10-CM

## 2012-05-17 LAB — URINE CULTURE

## 2012-05-17 MED ORDER — SULFAMETHOXAZOLE-TRIMETHOPRIM 800-160 MG PO TABS: 1.0000 | ORAL_TABLET | Freq: Two times a day (BID) | ORAL | Status: DC

## 2012-05-20 ENCOUNTER — Ambulatory Visit (INDEPENDENT_AMBULATORY_CARE_PROVIDER_SITE_OTHER): Payer: Medicare Other | Admitting: Internal Medicine

## 2012-05-20 ENCOUNTER — Encounter: Payer: Self-pay | Admitting: Internal Medicine

## 2012-05-20 VITALS — BP 122/62 | HR 72 | Temp 98.0°F | Wt 213.0 lb

## 2012-05-20 DIAGNOSIS — N400 Enlarged prostate without lower urinary tract symptoms: Secondary | ICD-10-CM

## 2012-05-20 NOTE — Patient Instructions (Addendum)
Take the antibiotic as prescribed x 2 weeks, will refer you to a urologist.

## 2012-05-20 NOTE — Assessment & Plan Note (Signed)
Has a quite enlarged prostate, recently found to have an asymptomatic UTI (not the first one he has). Last PSA ~ 3.0 We agreed that he will be refer to urology for further eval. To start taking Bactrim as soon as possible.

## 2012-05-20 NOTE — Progress Notes (Signed)
  Subjective:    Patient ID: Fernando Moyer, male    DOB: 1933/12/02, 79 y.o.   MRN: [id number]  HPI The patient scheduled an appointment to discusse "the urine test". Last urine culture came back positive, I sent him a message through Mychart  , the patient states that the account is managed by one of his family members, apparently, he did not get  the full message I sent.  Past Medical History  Diagnosis Date  . GERD with stricture     w/hx of stricture  . PVD (peripheral vascular disease) 12/07    per cath 12/07....iliac  . Carotid artery occlusion     last u/s 3-11...Marland Kitchenper vasc.surgery 2007 cath: nonobstructive CAD. mil;d MS, trivial AoS, small AAA,  . Bowen's disease     right arm BX: referred to dermatology  . Hyperlipidemia   . Emphysema     PFT 4/10 FVC 3.45 (90%), FEV1 2.72 (106%), TLC 5.36 (93%), DLCO 74%, NO BD RESPONSE: ? OF PSEUDONORMALIZATION OF PFT FINDINGS W/BOTH OBSTRUCTIVE AND RESTRICTIVE DEFECTS.  Marland Kitchen AAA (abdominal aortic aneurysm) 05/2008    PER CARDIAC CATH  . Aortic stenosis 05/2008    PER CARDIAC CATH, MILD, MITRAL STENOSIS  . Mitral stenosis 05/2008    PER CARDIAC CATH  . Coronary artery disease 05/2008    MILD, MEDICAL MANAGEMENT  . Pulmonary hypertension   . Hypertension 02/21/11    pt denies this hx  . Dyspnea      w/u included a cath and PFT's, sxms thought to be pulmonary  . Chronic back pain   . Peripheral neuropathy     right thigh; "it's been that way for years"  . Diabetes mellitus 09/07/2011   Past Surgical History  Procedure Laterality Date  . Appendectomy    . Esophageal dilation    . Cardiac catheterization  02/27/06    DR. DOWNEY: CORONARY ANGIOGRAPHY, ABDOMINAL AORTOGRAPHY  . Endarterectomy  08-2010    Right carotid endarterectomy    . Aortic valve replacement  08/17/2011    Procedure: AORTIC VALVE REPLACEMENT (AVR);  Surgeon: Alleen Borne, MD;  Location: [facility] OR;  Service: Open Heart Surgery;  Laterality: N/A;     Review of  Systems Doing well, taking the same medications as before. Denies fever chills. No nausea, vomiting. No dysuria or gross hematuria     Objective:   Physical Exam General -- alert, well-developed Neurologic-- alert & oriented X3 and strength normal in all extremities. Psych-- Cognition and judgment appear intact. Alert and cooperative with normal attention span and concentration.  not anxious appearing and not depressed appearing.        Assessment & Plan:   (I sent a message trough MyChart acount  Which is managed by a family member, apparently he did not get my full message, encouraged to cancel the account if it will be a source of unreliable communication)

## 2012-05-29 ENCOUNTER — Encounter: Payer: Self-pay | Admitting: Internal Medicine

## 2012-06-03 ENCOUNTER — Telehealth: Payer: Self-pay | Admitting: *Deleted

## 2012-06-03 NOTE — Telephone Encounter (Signed)
Pt's granddaughter called requesting a refill for percocet 5-325mg .  Last OV 2.8.14 Last filled 1.30.14

## 2012-06-04 MED ORDER — OXYCODONE-ACETAMINOPHEN 5-325 MG PO TABS: 1.0000 | ORAL_TABLET | ORAL | Status: DC | PRN

## 2012-06-04 NOTE — Telephone Encounter (Signed)
Pt's granddaughter states that he's taking it for the pain he's still having in his chest from surgery & he's also taking it for his lower back pain. Some days he doesn't take any & if it's a bad day he may take 2-3 tablets a day.   Pt has appt to see Dr. Annabell Howells @ [facility] 3.12.14

## 2012-06-04 NOTE — Telephone Encounter (Addendum)
Please ask patient, how many is he using a day, for what type of pain? do we have a urine test in the chart ?

## 2012-06-04 NOTE — Telephone Encounter (Signed)
Pt made aware rx is ready to be picked up.

## 2012-06-04 NOTE — Telephone Encounter (Signed)
Done

## 2012-06-04 NOTE — Telephone Encounter (Signed)
lmovm for pt's granddaughter to return call.

## 2012-06-06 ENCOUNTER — Telehealth: Payer: Self-pay | Admitting: Internal Medicine

## 2012-06-06 NOTE — Telephone Encounter (Signed)
Please call pt, like to be sure he finished abx Rx 05-17-12 and that he has a urology visit. Let me know

## 2012-06-10 NOTE — Telephone Encounter (Signed)
Left message to call office on Pt granddaughter phone. Tried Pt on home phone no answer or VM phone just rang.

## 2012-06-11 ENCOUNTER — Telehealth: Payer: Self-pay | Admitting: *Deleted

## 2012-06-11 NOTE — Telephone Encounter (Signed)
Spoke with patients daughter, she states pt is feeling better but the urology office called yesterday to cancel and reschedule his appt. He now has appt on 07/03/12, advised to call back with questions or concerns.

## 2012-06-26 ENCOUNTER — Other Ambulatory Visit: Payer: Self-pay | Admitting: Family Medicine

## 2012-06-27 NOTE — Telephone Encounter (Signed)
last seen 05/20/12 and filled 09/28/11 # 30. Please advise     KP//CMA

## 2012-07-01 NOTE — Telephone Encounter (Signed)
last seen 05/20/12 and filled 09/28/11 # 30. Please advise

## 2012-07-03 DIAGNOSIS — R3129 Other microscopic hematuria: Secondary | ICD-10-CM | POA: Diagnosis not present

## 2012-07-03 DIAGNOSIS — Z87440 Personal history of urinary (tract) infections: Secondary | ICD-10-CM | POA: Diagnosis not present

## 2012-07-03 DIAGNOSIS — N478 Other disorders of prepuce: Secondary | ICD-10-CM | POA: Diagnosis not present

## 2012-07-03 DIAGNOSIS — N401 Enlarged prostate with lower urinary tract symptoms: Secondary | ICD-10-CM | POA: Diagnosis not present

## 2012-07-04 ENCOUNTER — Telehealth: Payer: Self-pay | Admitting: *Deleted

## 2012-07-04 DIAGNOSIS — R3129 Other microscopic hematuria: Secondary | ICD-10-CM | POA: Diagnosis not present

## 2012-07-04 DIAGNOSIS — K8020 Calculus of gallbladder without cholecystitis without obstruction: Secondary | ICD-10-CM | POA: Diagnosis not present

## 2012-07-04 DIAGNOSIS — R42 Dizziness and giddiness: Secondary | ICD-10-CM

## 2012-07-04 MED ORDER — MECLIZINE HCL 32 MG PO TABS: 32.0000 mg | ORAL_TABLET | Freq: Three times a day (TID) | ORAL | Status: DC | PRN

## 2012-07-04 NOTE — Telephone Encounter (Signed)
Rx for [facility] [place]

## 2012-07-07 NOTE — Telephone Encounter (Signed)
See encounter from 07-04-12, Rx sent.

## 2012-08-05 DIAGNOSIS — M47817 Spondylosis without myelopathy or radiculopathy, lumbosacral region: Secondary | ICD-10-CM | POA: Diagnosis not present

## 2012-08-08 ENCOUNTER — Encounter: Payer: Self-pay | Admitting: Internal Medicine

## 2012-08-08 ENCOUNTER — Ambulatory Visit (INDEPENDENT_AMBULATORY_CARE_PROVIDER_SITE_OTHER): Payer: Medicare Other | Admitting: Internal Medicine

## 2012-08-08 VITALS — BP 114/68 | HR 84 | Wt 214.0 lb

## 2012-08-08 DIAGNOSIS — N400 Enlarged prostate without lower urinary tract symptoms: Secondary | ICD-10-CM | POA: Diagnosis not present

## 2012-08-08 DIAGNOSIS — I4892 Unspecified atrial flutter: Secondary | ICD-10-CM

## 2012-08-08 DIAGNOSIS — E119 Type 2 diabetes mellitus without complications: Secondary | ICD-10-CM

## 2012-08-08 DIAGNOSIS — I359 Nonrheumatic aortic valve disorder, unspecified: Secondary | ICD-10-CM | POA: Diagnosis not present

## 2012-08-08 DIAGNOSIS — I2510 Atherosclerotic heart disease of native coronary artery without angina pectoris: Secondary | ICD-10-CM

## 2012-08-08 LAB — MICROALBUMIN / CREATININE URINE RATIO
Creatinine,U: 29.7 mg/dL
Microalb Creat Ratio: 4 mg/g (ref 0.0–30.0)

## 2012-08-08 LAB — BASIC METABOLIC PANEL
Calcium: 9.6 mg/dL (ref 8.4–10.5)
Creatinine, Ser: 0.9 mg/dL (ref 0.4–1.5)
GFR: 82.69 mL/min (ref >60.00)
Glucose, Bld: 140 mg/dL — ABNORMAL HIGH (ref 70–99)
Sodium: 138 mEq/L (ref 135–145)

## 2012-08-08 LAB — HEMOGLOBIN A1C: Hgb A1c MFr Bld: 6.6 % — ABNORMAL HIGH (ref 4.6–6.5)

## 2012-08-08 NOTE — Assessment & Plan Note (Signed)
On no medications, not ambulatory CBGs. Last A1c   slightly increased, recheck a A1c

## 2012-08-08 NOTE — Progress Notes (Signed)
  Subjective:    Patient ID: Fernando Moyer, male    DOB: 02/15/1934, 79 y.o.   MRN: [id number]  HPI Followup visit, here with Melissa Back pain, had a local injection recently,pain  has decreased significantly. BPH, UTIs: Saw urology, reportedly had a CT which was okay from the urological standpoint. They're planning to do a cystoscopy. Borderline diabetes, not ambulatory CBGs. Due for A1c. High cholesterol good compliance with Pravachol. Paroxysmal A. Fib, on Pradaxa  Past Medical History  Diagnosis Date  . GERD with stricture     w/hx of stricture  . PVD (peripheral vascular disease) 12/07    per cath 12/07....iliac  . Carotid artery occlusion     last u/s 3-11...Marland Kitchenper vasc.surgery 2007 cath: nonobstructive CAD. mil;d MS, trivial AoS, small AAA,  . Bowen's disease     right arm BX: referred to dermatology  . Hyperlipidemia   . Emphysema     PFT 4/10 FVC 3.45 (90%), FEV1 2.72 (106%), TLC 5.36 (93%), DLCO 74%, NO BD RESPONSE: ? OF PSEUDONORMALIZATION OF PFT FINDINGS W/BOTH OBSTRUCTIVE AND RESTRICTIVE DEFECTS.  Marland Kitchen AAA (abdominal aortic aneurysm) 05/2008    PER CARDIAC CATH  . Aortic stenosis 05/2008    PER CARDIAC CATH, MILD, MITRAL STENOSIS  . Mitral stenosis 05/2008    PER CARDIAC CATH  . Coronary artery disease 05/2008    MILD, MEDICAL MANAGEMENT  . Pulmonary hypertension   . Hypertension 02/21/11    pt denies this hx  . Dyspnea      w/u included a cath and PFT's, sxms thought to be pulmonary  . Chronic back pain   . Peripheral neuropathy     right thigh; "it's been that way for years"  . Diabetes mellitus 09/07/2011   Past Surgical History  Procedure Laterality Date  . Appendectomy    . Esophageal dilation    . Cardiac catheterization  02/27/06    DR. DOWNEY: CORONARY ANGIOGRAPHY, ABDOMINAL AORTOGRAPHY  . Endarterectomy  08-2010    Right carotid endarterectomy    . Aortic valve replacement  08/17/2011    Procedure: AORTIC VALVE REPLACEMENT (AVR);  Surgeon: Alleen Borne,  MD;  Location: [facility] OR;  Service: Open Heart Surgery;  Laterality: N/A;      Review of Systems Denies chest pain, shortness or breath or palpitations. Denies any dysuria, fever, chills. No nausea or vomiting.      Objective:   Physical Exam BP 114/68  Pulse 84  Wt 214 lb (97.07 kg)  BMI 33.51 kg/m2  SpO2 97%  General -- alert, well-developed, No apparent distress   Lungs -- normal respiratory effort, no intercostal retractions, no accessory muscle use, and normal breath sounds.   Heart-- normal rate, regular rhythm, Soft systolic murmur.   Extremities-- no pretibial edema bilaterally  Neurologic-- alert & oriented X3 and strength normal in all extremities. Psych-- Cognition and judgment appear intact. Alert and cooperative with normal attention span and concentration.  not anxious appearing and not depressed appearing.       Assessment & Plan:

## 2012-08-08 NOTE — Assessment & Plan Note (Addendum)
On pradaxa, He is having a cystoscopy in the near future, recommend to call urology and see if pradaxa needs to be held

## 2012-08-08 NOTE — Patient Instructions (Addendum)
Next visit 4 to 5 months See the eye doctor at least yearly

## 2012-08-08 NOTE — Assessment & Plan Note (Addendum)
Saw urology, had a CT, told okay from the urological standpoint. to have a cystoscopy as part of the workup.

## 2012-08-08 NOTE — Assessment & Plan Note (Signed)
Asymptomatic. 

## 2012-08-13 DIAGNOSIS — R3129 Other microscopic hematuria: Secondary | ICD-10-CM | POA: Diagnosis not present

## 2012-08-13 DIAGNOSIS — N401 Enlarged prostate with lower urinary tract symptoms: Secondary | ICD-10-CM | POA: Diagnosis not present

## 2012-08-27 ENCOUNTER — Telehealth: Payer: Self-pay | Admitting: *Deleted

## 2012-08-27 MED ORDER — OXYCODONE-ACETAMINOPHEN 5-325 MG PO TABS: 1.0000 | ORAL_TABLET | ORAL | Status: DC | PRN

## 2012-08-27 NOTE — Telephone Encounter (Signed)
Refill request for oxycodone 5-325mg  Last OV 5.9.14 Last filled 3.5.14

## 2012-08-27 NOTE — Telephone Encounter (Signed)
done

## 2012-08-28 NOTE — Telephone Encounter (Signed)
Left msg on vmail advising pt his rx is ready to be picked up.

## 2012-09-28 ENCOUNTER — Other Ambulatory Visit: Payer: Self-pay | Admitting: Internal Medicine

## 2012-09-29 NOTE — Telephone Encounter (Signed)
Refill done.  

## 2012-10-09 ENCOUNTER — Telehealth: Payer: Self-pay | Admitting: Internal Medicine

## 2012-10-09 MED ORDER — OXYCODONE-ACETAMINOPHEN 5-325 MG PO TABS: 1.0000 | ORAL_TABLET | ORAL | Status: DC | PRN

## 2012-10-09 NOTE — Telephone Encounter (Signed)
Pt made aware rx is ready to be picked up.

## 2012-10-09 NOTE — Telephone Encounter (Signed)
done

## 2012-10-09 NOTE — Telephone Encounter (Signed)
Ok to refill oxycodone 5-325mg  Last OV 5.9.14 Last filled 5.28.14

## 2012-10-24 ENCOUNTER — Ambulatory Visit (INDEPENDENT_AMBULATORY_CARE_PROVIDER_SITE_OTHER): Payer: Medicare Other | Admitting: Cardiovascular Disease

## 2012-10-24 ENCOUNTER — Encounter: Payer: Self-pay | Admitting: Cardiovascular Disease

## 2012-10-24 VITALS — BP 128/70 | HR 94 | Ht 67.0 in | Wt 219.1 lb

## 2012-10-24 DIAGNOSIS — I2510 Atherosclerotic heart disease of native coronary artery without angina pectoris: Secondary | ICD-10-CM

## 2012-10-24 DIAGNOSIS — I359 Nonrheumatic aortic valve disorder, unspecified: Secondary | ICD-10-CM | POA: Diagnosis not present

## 2012-10-24 NOTE — Progress Notes (Signed)
HPI:  79 year old gentleman presenting for followup evaluation. The patient has undergone bioprosthetic aortic valve replacement in 2013. He's also followed for hypertension and paroxysmal atrial fibrillation and is maintained on chronic anticoagulation with pradaxa. Last echocardiogram one year ago showed normal left ventricular systolic function and normal function of his aortic valve bioprosthesis.  He complains of shortness of breath with exertion, occasional dizziness, and generalized fatigue. His dizziness is more of a vertigo-like sensation and seems to respond to meclizine. He denies orthostatic symptoms. He denies leg swelling, orthopnea, or PND. 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 He reports no bleeding problems.  Outpatient Encounter Prescriptions as of 10/24/2012  Medication Sig Dispense Refill  . furosemide (LASIX) 40 MG tablet Take 1 tablet (40 mg total) by mouth daily.  90 tablet  4  . meclizine (ANTIVERT) 25 MG tablet TAKE 1 TABLET 3 TIMES A DAY AS NEEDED FOR DIZZINESS  30 tablet  0  . meclizine (ANTIVERT) 32 MG tablet Take 1 tablet (32 mg total) by mouth 3 (three) times daily as needed for dizziness.  30 tablet  0  . omeprazole (PRILOSEC) 20 MG capsule Take 20 mg by mouth daily.       Marland Kitchen oxyCODONE-acetaminophen (PERCOCET/ROXICET) 5-325 MG per tablet Take 1 tablet by mouth every 4 (four) hours as needed (chest or back pain). For pain  60 tablet  0  . potassium chloride SA (K-DUR,KLOR-CON) 20 MEQ tablet Take 1 tablet (20 mEq total) by mouth daily.  90 tablet  4  . PRADAXA 150 MG CAPS TAKE 1 CAPSULE BY MOUTH EVERY 12 HOURS  180 capsule  3  . pravastatin (PRAVACHOL) 40 MG tablet Take 1 tablet (40 mg total) by mouth daily.  30 tablet  6  . [DISCONTINUED] guaiFENesin (MUCINEX) 600 MG 12 hr tablet Take 1,200 mg by mouth 2 (two) times daily.      . [DISCONTINUED] zoster vaccine live, PF, (ZOSTAVAX) 16109  UNT/0.65ML injection Inject 19,400 Units into the skin once.  1 each  0   No facility-administered encounter medications on file as of 10/24/2012.    No Known Allergies  Past Medical History  Diagnosis Date  . GERD with stricture     w/hx of stricture  . PVD (peripheral vascular disease) 12/07    per cath 12/07....iliac  . Carotid artery occlusion     last u/s 3-11...Marland Kitchenper vasc.surgery 2007 cath: nonobstructive CAD. mil;d MS, trivial AoS, small AAA,  . Bowen's disease     right arm BX: referred to dermatology  . Hyperlipidemia   . Emphysema     PFT 4/10 FVC 3.45 (90%), FEV1 2.72 (106%), TLC 5.36 (93%), DLCO 74%, NO BD RESPONSE: ? OF PSEUDONORMALIZATION OF PFT FINDINGS W/BOTH OBSTRUCTIVE AND RESTRICTIVE DEFECTS.  Marland Kitchen AAA (abdominal aortic aneurysm) 05/2008    PER CARDIAC CATH  . Aortic stenosis 05/2008    PER CARDIAC CATH, MILD, MITRAL STENOSIS  . Mitral stenosis 05/2008    PER CARDIAC CATH  . Coronary artery disease 05/2008    MILD, MEDICAL MANAGEMENT  . Pulmonary hypertension   . Hypertension 02/21/11    pt denies this hx  . Dyspnea      w/u included a cath and PFT's, sxms thought to be pulmonary  . Chronic back pain   . Peripheral neuropathy     right thigh; "it's been that way for years"  . Diabetes mellitus 09/07/2011  ROS: Negative except as per HPI  BP 128/70  Pulse 94  Ht 5\' 7"  (1.702 m)  Wt 99.392 kg (219 lb 1.9 oz)  BMI 34.31 kg/m2  SpO2 94%  PHYSICAL EXAM: Pt is alert and oriented, elderly, pleasant overweight male in NAD HEENT: normal Neck: JVP - normal, carotids 2+= with bilateral bruits Lungs: CTA bilaterally CV: RRR with grade 2/6 systolic ejection murmur at the right upper sternal border Abd: soft, NT, Positive BS, no hepatomegaly Ext: no C/C/E Skin: warm/dry no rash  EKG:  Normal sinus rhythm with right bundle branch block and left anterior fascicular block, frequent PACs.  ASSESSMENT AND PLAN: 1. Aortic stenosis status post bioprosthetic AVR. I  think his valvular disease is stable. Will continue observation with followup in 6 months.  2. Coronary artery disease, native vessel. No angina at present. Continue medical management.  3. Paroxysmal atrial fibrillation. The patient has PACs on his EKG today. He is anticoagulated without bleeding problems.  4. Carotid stenosis. He is on appropriate therapy with anticoagulation and a statin drug. He is followed by vascular surgery.  Tonny Bollman 10/25/2012 3:41 PM

## 2012-10-24 NOTE — Patient Instructions (Addendum)
Your physician wants you to follow-up in: 6 months  You will receive a reminder letter in the mail two months in advance. If you don't receive a letter, please call our office to schedule the follow-up appointment.  Your physician recommends that you continue on your current medications as directed. Please refer to the Current Medication list given to you today.  

## 2012-10-28 ENCOUNTER — Other Ambulatory Visit: Payer: Self-pay | Admitting: Internal Medicine

## 2012-10-29 NOTE — Telephone Encounter (Signed)
Refill done for 3 month supply per verbal order Dr. Drue Novel.

## 2012-11-20 ENCOUNTER — Inpatient Hospital Stay (HOSPITAL_COMMUNITY)
Admission: EM | Admit: 2012-11-20 | Discharge: 2012-11-22 | DRG: 313 | Disposition: A | Discharge status: Home / Self Care | Payer: Medicare Other | Attending: Internal Medicine | Admitting: Internal Medicine

## 2012-11-20 ENCOUNTER — Emergency Department (HOSPITAL_COMMUNITY): Payer: Medicare Other

## 2012-11-20 ENCOUNTER — Encounter (HOSPITAL_COMMUNITY): Payer: Self-pay | Admitting: Emergency Medicine

## 2012-11-20 DIAGNOSIS — E119 Type 2 diabetes mellitus without complications: Secondary | ICD-10-CM

## 2012-11-20 DIAGNOSIS — R079 Chest pain, unspecified: Secondary | ICD-10-CM | POA: Diagnosis not present

## 2012-11-20 DIAGNOSIS — I779 Disorder of arteries and arterioles, unspecified: Secondary | ICD-10-CM | POA: Diagnosis present

## 2012-11-20 DIAGNOSIS — I714 Abdominal aortic aneurysm, without rupture, unspecified: Secondary | ICD-10-CM | POA: Diagnosis present

## 2012-11-20 DIAGNOSIS — K219 Gastro-esophageal reflux disease without esophagitis: Secondary | ICD-10-CM | POA: Diagnosis present

## 2012-11-20 DIAGNOSIS — I4892 Unspecified atrial flutter: Secondary | ICD-10-CM

## 2012-11-20 DIAGNOSIS — Z87891 Personal history of nicotine dependence: Secondary | ICD-10-CM

## 2012-11-20 DIAGNOSIS — I10 Essential (primary) hypertension: Secondary | ICD-10-CM | POA: Diagnosis present

## 2012-11-20 DIAGNOSIS — I951 Orthostatic hypotension: Secondary | ICD-10-CM

## 2012-11-20 DIAGNOSIS — Z954 Presence of other heart-valve replacement: Secondary | ICD-10-CM | POA: Diagnosis not present

## 2012-11-20 DIAGNOSIS — I2510 Atherosclerotic heart disease of native coronary artery without angina pectoris: Secondary | ICD-10-CM | POA: Diagnosis present

## 2012-11-20 DIAGNOSIS — J438 Other emphysema: Secondary | ICD-10-CM | POA: Diagnosis not present

## 2012-11-20 DIAGNOSIS — R0789 Other chest pain: Secondary | ICD-10-CM | POA: Diagnosis not present

## 2012-11-20 DIAGNOSIS — I2789 Other specified pulmonary heart diseases: Secondary | ICD-10-CM | POA: Diagnosis present

## 2012-11-20 DIAGNOSIS — E785 Hyperlipidemia, unspecified: Secondary | ICD-10-CM | POA: Diagnosis present

## 2012-11-20 DIAGNOSIS — K222 Esophageal obstruction: Secondary | ICD-10-CM | POA: Diagnosis present

## 2012-11-20 DIAGNOSIS — G8929 Other chronic pain: Secondary | ICD-10-CM | POA: Diagnosis present

## 2012-11-20 DIAGNOSIS — I050 Rheumatic mitral stenosis: Secondary | ICD-10-CM

## 2012-11-20 DIAGNOSIS — I739 Peripheral vascular disease, unspecified: Secondary | ICD-10-CM | POA: Diagnosis present

## 2012-11-20 DIAGNOSIS — I252 Old myocardial infarction: Secondary | ICD-10-CM

## 2012-11-20 DIAGNOSIS — N400 Enlarged prostate without lower urinary tract symptoms: Secondary | ICD-10-CM

## 2012-11-20 DIAGNOSIS — G609 Hereditary and idiopathic neuropathy, unspecified: Secondary | ICD-10-CM | POA: Diagnosis present

## 2012-11-20 DIAGNOSIS — R072 Precordial pain: Secondary | ICD-10-CM | POA: Diagnosis not present

## 2012-11-20 DIAGNOSIS — I4891 Unspecified atrial fibrillation: Secondary | ICD-10-CM | POA: Diagnosis present

## 2012-11-20 DIAGNOSIS — I480 Paroxysmal atrial fibrillation: Secondary | ICD-10-CM

## 2012-11-20 DIAGNOSIS — R42 Dizziness and giddiness: Secondary | ICD-10-CM

## 2012-11-20 DIAGNOSIS — I359 Nonrheumatic aortic valve disorder, unspecified: Secondary | ICD-10-CM

## 2012-11-20 DIAGNOSIS — R51 Headache: Secondary | ICD-10-CM

## 2012-11-20 DIAGNOSIS — Z0000 Encounter for general adult medical examination without abnormal findings: Secondary | ICD-10-CM

## 2012-11-20 DIAGNOSIS — K8020 Calculus of gallbladder without cholecystitis without obstruction: Secondary | ICD-10-CM | POA: Diagnosis not present

## 2012-11-20 DIAGNOSIS — L719 Rosacea, unspecified: Secondary | ICD-10-CM

## 2012-11-20 DIAGNOSIS — Z952 Presence of prosthetic heart valve: Secondary | ICD-10-CM

## 2012-11-20 LAB — CBC
HCT: 42 % (ref 39.0–52.0)
Hemoglobin: 15 g/dL (ref 13.0–17.0)
MCH: 31.1 pg (ref 26.0–34.0)
MCHC: 35.7 g/dL (ref 30.0–36.0)
MCV: 87.1 fL (ref 78.0–100.0)
Platelets: 236 10*3/uL (ref 150–400)
RBC: 4.82 MIL/uL (ref 4.22–5.81)
RDW: 13.9 % (ref 11.5–15.5)
WBC: 10.2 10*3/uL (ref 4.0–10.5)

## 2012-11-20 LAB — BASIC METABOLIC PANEL
BUN: 13 mg/dL (ref 6–23)
CO2: 28 mEq/L (ref 19–32)
Calcium: 9.9 mg/dL (ref 8.4–10.5)
Chloride: 103 mEq/L (ref 96–112)
Creatinine, Ser: 0.91 mg/dL (ref 0.50–1.35)
GFR calc Af Amer: >90 mL/min (ref >90)
GFR calc non Af Amer: 80 mL/min — ABNORMAL LOW (ref >90)
Glucose, Bld: 176 mg/dL — ABNORMAL HIGH (ref 70–99)
Potassium: 4.4 mEq/L (ref 3.5–5.1)
Sodium: 140 mEq/L (ref 135–145)

## 2012-11-20 LAB — LIPASE, BLOOD: Lipase: 41 U/L (ref 11–59)

## 2012-11-20 LAB — TYPE AND SCREEN
ABO/RH(D): A POS
Antibody Screen: NEGATIVE

## 2012-11-20 LAB — PRO B NATRIURETIC PEPTIDE: Pro B Natriuretic peptide (BNP): 187.4 pg/mL (ref 0–450)

## 2012-11-20 LAB — TROPONIN I: Troponin I: <0.3 ng/mL (ref <0.30)

## 2012-11-20 MED ORDER — KETOROLAC TROMETHAMINE 30 MG/ML IJ SOLN
15.0000 mg | Freq: Once | INTRAMUSCULAR | Status: AC
  Administered 2012-11-21: 15 mg via INTRAVENOUS

## 2012-11-20 MED ORDER — LORAZEPAM 2 MG/ML IJ SOLN
1.0000 mg | Freq: Once | INTRAMUSCULAR | Status: AC
  Administered 2012-11-20: 1 mg via INTRAVENOUS
  Filled 2012-11-20: qty 1

## 2012-11-20 MED ORDER — SODIUM CHLORIDE 0.9 % IV BOLUS (SEPSIS)
1000.0000 mL | Freq: Once | INTRAVENOUS | Status: AC
  Administered 2012-11-21: 1000 mL via INTRAVENOUS

## 2012-11-20 MED ORDER — MORPHINE SULFATE 4 MG/ML IJ SOLN
4.0000 mg | Freq: Once | INTRAMUSCULAR | Status: AC
  Administered 2012-11-21: 4 mg via INTRAVENOUS
  Filled 2012-11-20: qty 1

## 2012-11-20 MED ORDER — MORPHINE SULFATE 4 MG/ML IJ SOLN
6.0000 mg | Freq: Once | INTRAMUSCULAR | Status: AC
  Administered 2012-11-20: 6 mg via INTRAVENOUS
  Filled 2012-11-20: qty 2

## 2012-11-20 MED ORDER — IOHEXOL 350 MG/ML SOLN
100.0000 mL | Freq: Once | INTRAVENOUS | Status: AC | PRN
  Administered 2012-11-20: 100 mL via INTRAVENOUS

## 2012-11-20 MED ORDER — ONDANSETRON HCL 4 MG/2ML IJ SOLN
4.0000 mg | Freq: Once | INTRAMUSCULAR | Status: AC
  Administered 2012-11-20: 4 mg via INTRAVENOUS
  Filled 2012-11-20: qty 2

## 2012-11-20 NOTE — ED Notes (Signed)
PT. REPORTS MID CHEST PAIN ONSET YESTERDAY WORSE THIS EVENING WITH SOB , DENIES COUGH , NAUSEA OR DIAPHORESIS , HISTORY OF AORTIC VALVE REPLACEMENT  - HIS CARDIOLOGIST IS DR. Excell Seltzer.

## 2012-11-21 ENCOUNTER — Encounter (HOSPITAL_COMMUNITY): Payer: Self-pay | Admitting: Physician Assistant

## 2012-11-21 DIAGNOSIS — E119 Type 2 diabetes mellitus without complications: Secondary | ICD-10-CM

## 2012-11-21 DIAGNOSIS — I480 Paroxysmal atrial fibrillation: Secondary | ICD-10-CM

## 2012-11-21 DIAGNOSIS — I2510 Atherosclerotic heart disease of native coronary artery without angina pectoris: Secondary | ICD-10-CM | POA: Diagnosis not present

## 2012-11-21 DIAGNOSIS — K219 Gastro-esophageal reflux disease without esophagitis: Secondary | ICD-10-CM

## 2012-11-21 DIAGNOSIS — R0789 Other chest pain: Secondary | ICD-10-CM

## 2012-11-21 DIAGNOSIS — R079 Chest pain, unspecified: Secondary | ICD-10-CM | POA: Diagnosis not present

## 2012-11-21 DIAGNOSIS — I4892 Unspecified atrial flutter: Secondary | ICD-10-CM | POA: Diagnosis not present

## 2012-11-21 DIAGNOSIS — Z952 Presence of prosthetic heart valve: Secondary | ICD-10-CM

## 2012-11-21 LAB — URINALYSIS, ROUTINE W REFLEX MICROSCOPIC
Bilirubin Urine: NEGATIVE
Glucose, UA: NEGATIVE mg/dL
Ketones, ur: NEGATIVE mg/dL
Leukocytes, UA: NEGATIVE
Nitrite: NEGATIVE
Protein, ur: NEGATIVE mg/dL
Specific Gravity, Urine: >1.046 — ABNORMAL HIGH (ref 1.005–1.030)
Urobilinogen, UA: 0.2 mg/dL (ref 0.0–1.0)
pH: 6.5 (ref 5.0–8.0)

## 2012-11-21 LAB — TROPONIN I
Troponin I: <0.3 ng/mL (ref <0.30)
Troponin I: <0.3 ng/mL (ref <0.30)
Troponin I: <0.3 ng/mL (ref <0.30)

## 2012-11-21 LAB — URINE MICROSCOPIC-ADD ON

## 2012-11-21 MED ORDER — KETOROLAC TROMETHAMINE 30 MG/ML IJ SOLN
INTRAMUSCULAR | Status: AC
  Filled 2012-11-21: qty 1

## 2012-11-21 MED ORDER — DABIGATRAN ETEXILATE MESYLATE 150 MG PO CAPS
150.0000 mg | ORAL_CAPSULE | Freq: Two times a day (BID) | ORAL | Status: DC
  Administered 2012-11-21 – 2012-11-22 (×4): 150 mg via ORAL
  Filled 2012-11-21 (×5): qty 1

## 2012-11-21 MED ORDER — MORPHINE SULFATE 2 MG/ML IJ SOLN: 2.0000 mg | INTRAMUSCULAR | Status: DC | PRN

## 2012-11-21 MED ORDER — SODIUM CHLORIDE 0.9 % IJ SOLN
3.0000 mL | Freq: Two times a day (BID) | INTRAMUSCULAR | Status: DC
  Administered 2012-11-21: 3 mL via INTRAVENOUS

## 2012-11-21 MED ORDER — OXYCODONE HCL 5 MG PO TABS: 5.0000 mg | ORAL_TABLET | ORAL | Status: DC | PRN

## 2012-11-21 MED ORDER — PANTOPRAZOLE SODIUM 40 MG PO TBEC
40.0000 mg | DELAYED_RELEASE_TABLET | Freq: Two times a day (BID) | ORAL | Status: DC
  Administered 2012-11-22: 40 mg via ORAL
  Filled 2012-11-21: qty 1

## 2012-11-21 MED ORDER — SIMVASTATIN 20 MG PO TABS
20.0000 mg | ORAL_TABLET | Freq: Every day | ORAL | Status: DC
  Administered 2012-11-21: 20 mg via ORAL
  Filled 2012-11-21 (×2): qty 1

## 2012-11-21 MED ORDER — DOCUSATE SODIUM 100 MG PO CAPS
100.0000 mg | ORAL_CAPSULE | Freq: Two times a day (BID) | ORAL | Status: DC
  Administered 2012-11-21 – 2012-11-22 (×3): 100 mg via ORAL
  Filled 2012-11-21 (×2): qty 1

## 2012-11-21 MED ORDER — MECLIZINE HCL 25 MG PO TABS
25.0000 mg | ORAL_TABLET | Freq: Three times a day (TID) | ORAL | Status: DC | PRN
  Filled 2012-11-21: qty 1

## 2012-11-21 MED ORDER — ONDANSETRON HCL 4 MG PO TABS: 4.0000 mg | ORAL_TABLET | Freq: Four times a day (QID) | ORAL | Status: DC | PRN

## 2012-11-21 MED ORDER — FUROSEMIDE 40 MG PO TABS
40.0000 mg | ORAL_TABLET | Freq: Every day | ORAL | Status: DC
  Administered 2012-11-21 – 2012-11-22 (×2): 40 mg via ORAL
  Filled 2012-11-21 (×2): qty 1

## 2012-11-21 MED ORDER — ONDANSETRON HCL 4 MG/2ML IJ SOLN: 4.0000 mg | Freq: Four times a day (QID) | INTRAMUSCULAR | Status: DC | PRN

## 2012-11-21 MED ORDER — GI COCKTAIL ~~LOC~~
30.0000 mL | Freq: Once | ORAL | Status: AC
  Administered 2012-11-21: 30 mL via ORAL
  Filled 2012-11-21: qty 30

## 2012-11-21 MED ORDER — POTASSIUM CHLORIDE CRYS ER 20 MEQ PO TBCR
20.0000 meq | EXTENDED_RELEASE_TABLET | Freq: Every day | ORAL | Status: DC
  Administered 2012-11-21 – 2012-11-22 (×2): 20 meq via ORAL
  Filled 2012-11-21 (×2): qty 1

## 2012-11-21 MED ORDER — REGADENOSON 0.4 MG/5ML IV SOLN
0.4000 mg | Freq: Once | INTRAVENOUS | Status: AC
  Administered 2012-11-22: 0.4 mg via INTRAVENOUS
  Filled 2012-11-21: qty 5

## 2012-11-21 MED ORDER — PANTOPRAZOLE SODIUM 40 MG PO TBEC
40.0000 mg | DELAYED_RELEASE_TABLET | Freq: Every day | ORAL | Status: DC
Start: 2012-11-21 — End: 2012-11-21
  Administered 2012-11-21: 40 mg via ORAL
  Filled 2012-11-21: qty 1

## 2012-11-21 NOTE — Clinical Documentation Improvement (Signed)
THIS DOCUMENT IS NOT A PERMANENT PART OF THE MEDICAL RECORD  Please update your documentation with the medical record to reflect your response to this query. If you need help knowing how to do this please call [phone].           11/21/12  Dear Dr. Suanne Marker Marton Redwood  In an effort to better capture your patient's severity of illness, reflect appropriate length of stay and utilization of resources, a review of the patient medical record has revealed the following indicators.    Based on your clinical judgment, please clarify and document in a progress note and/or discharge summary the clinical condition associated with the following supporting information:   Possible Clinical Conditions?   Based on your clinical judgment, please clarify cause of Chest Pain:  _______Acute MI _______Unstable Angina  _______ACS _______Musculoskeletal Chest Pain _______Pleuritic Chest Pain _______GERD _______Costochondritis _______Cholelithiasis / Cholecystitis _______Dressler's syndrome _______Chest wall pain _______Other Condition _______Cannot Clinically Determine     Risk Factors: Patient admitted with atypical chest pain per 8/21 progress notes. Enzymes negative, ECG not acute, despite > 48 hr of pain; there is a concern for a GI origin, will try a GI cocktail and increase Prilosec to 40mg  BID, noted per 8/22 progress notes.   You may use possible, probable, or suspect with inpatient documentation. possible, probable, suspected diagnoses MUST be documented at the time of discharge  Reviewed:  no additional documentation provided  Thank You,  Marciano Sequin,  Clinical Documentation Specialist: [phone] [facility] Information [facility] [place]

## 2012-11-21 NOTE — Progress Notes (Signed)
11/21/12 1500 Pt. did not meet services to meet for Inpatient stay.  Sent utilization review to [facility] and Dr. Alice Reichert determined pt. meets for Observation.  This NCM spoke with pt. and explained Code 44, and the Medicare guidelines.  Pt. signed the Medicare Outpatient Observation Information sheet and this NCM gave pt. a copy of the document.  Pt. may dc home tomorrow after further testing. Tera Mater, RN, BSN NCM [phone]

## 2012-11-21 NOTE — H&P (Signed)
Fernando Moyer History and Physical  Fernando Moyer XLK:[id number] DOB: 01-Sep-1933    PCP:   Fernando Ora, MD   Chief Complaint: severe right sided chest pain with SOB.  HPI: Fernando Moyer is an 79 y.o. male with hx of prior MI, CAD, coratid stenosis being followed by vascular sx, GERD, DM, Paroxysmal aflutter/fib on Pradaxa, hx of bioprosthetic valve replacemnt, presents to the ER with 2 days hx of severe right sided chest pain.  He had some shortness of breath as well.  There were some financial stress at home according to his son.  He felt markedly better in the ER without significant treatment, except pain medicine IV, and his SOB resolved as well.  EKG showed LBBB, and troponin was negative.  He also had a chest CTA which did not show P embolism nor dissecting vessels.  Hospitalist was asked to admit him for atypical chest pain, cardiac r/out.  Rewiew of Systems:  Constitutional: Negative for malaise, fever and chills. No significant weight loss or weight gain Eyes: Negative for eye pain, redness and discharge, diplopia, visual changes, or flashes of light. ENMT: Negative for ear pain, hoarseness, nasal congestion, sinus pressure and sore throat. No headaches; tinnitus, drooling, or problem swallowing. Cardiovascular: Negative for palpitations, diaphoresis, and peripheral edema. ; No orthopnea, PND Respiratory: Negative for cough, hemoptysis, wheezing and stridor. No pleuritic chestpain. Gastrointestinal: Negative for nausea, vomiting, diarrhea, constipation, abdominal pain, melena, blood in stool, hematemesis, jaundice and rectal bleeding.    Genitourinary: Negative for frequency, dysuria, incontinence,flank pain and hematuria; Musculoskeletal: Negative for back pain and neck pain. Negative for swelling and trauma.;  Skin: . Negative for pruritus, rash, abrasions, bruising and skin lesion.; ulcerations Neuro: Negative for headache, lightheadedness and neck stiffness. Negative for  weakness, altered level of consciousness , altered mental status, extremity weakness, burning feet, involuntary movement, seizure and syncope.  Psych: negative for anxiety, depression, insomnia, tearfulness, panic attacks, hallucinations, paranoia, suicidal or homicidal ideation    Past Medical History  Diagnosis Date  . GERD with stricture     w/hx of stricture  . PVD (peripheral vascular disease) 12/07    per cath 12/07....iliac  . Carotid artery occlusion     last u/s 3-11...Marland Kitchenper vasc.surgery 2007 cath: nonobstructive CAD. mil;d MS, trivial AoS, small AAA,  . Bowen's disease     right arm BX: referred to dermatology  . Hyperlipidemia   . Emphysema     PFT 4/10 FVC 3.45 (90%), FEV1 2.72 (106%), TLC 5.36 (93%), DLCO 74%, NO BD RESPONSE: ? OF PSEUDONORMALIZATION OF PFT FINDINGS W/BOTH OBSTRUCTIVE AND RESTRICTIVE DEFECTS.  Marland Kitchen AAA (abdominal aortic aneurysm) 05/2008    PER CARDIAC CATH  . Aortic stenosis 05/2008    PER CARDIAC CATH, MILD, MITRAL STENOSIS  . Mitral stenosis 05/2008    PER CARDIAC CATH  . Coronary artery disease 05/2008    MILD, MEDICAL MANAGEMENT  . Pulmonary hypertension   . Hypertension 02/21/11    pt denies this hx  . Dyspnea      w/u included a cath and PFT's, sxms thought to be pulmonary  . Chronic back pain   . Peripheral neuropathy     right thigh; "it's been that way for years"  . Diabetes mellitus 09/07/2011    Past Surgical History  Procedure Laterality Date  . Appendectomy    . Esophageal dilation    . Cardiac catheterization  02/27/06    DR. DOWNEY: CORONARY ANGIOGRAPHY, ABDOMINAL AORTOGRAPHY  . Endarterectomy  08-2010  Right carotid endarterectomy    . Aortic valve replacement  08/17/2011    Procedure: AORTIC VALVE REPLACEMENT (AVR);  Surgeon: Alleen Borne, MD;  Location: [facility] OR;  Service: Open Heart Surgery;  Laterality: N/A;    Medications:  HOME MEDS: Prior to Admission medications   Medication Sig Start Date End Date Taking? Authorizing  Provider  dabigatran (PRADAXA) 150 MG CAPS capsule Take 150 mg by mouth every 12 (twelve) hours.   Yes Historical Provider, MD  furosemide (LASIX) 40 MG tablet Take 1 tablet (40 mg total) by mouth daily. 03/31/12  Yes Tonny Bollman, MD  meclizine (ANTIVERT) 25 MG tablet Take 25 mg by mouth 3 (three) times daily as needed for dizziness.   Yes Historical Provider, MD  omeprazole (PRILOSEC OTC) 20 MG tablet Take 20 mg by mouth daily.   Yes Historical Provider, MD  oxyCODONE (OXY IR/ROXICODONE) 5 MG immediate release tablet Take 5 mg by mouth every 4 (four) hours as needed for pain.   Yes Historical Provider, MD  potassium chloride SA (K-DUR,KLOR-CON) 20 MEQ tablet Take 1 tablet (20 mEq total) by mouth daily. 03/31/12  Yes Tonny Bollman, MD  pravastatin (PRAVACHOL) 40 MG tablet Take 40 mg by mouth every evening.    Yes Historical Provider, MD     Allergies:  No Known Allergies  Social History:   reports that he quit smoking about 26 years ago. His smoking use included Cigarettes. He has a 60 pack-year smoking history. He has never used smokeless tobacco. He reports that he does not drink alcohol or use illicit drugs.  Family History: Family History  Problem Relation Age of Onset  . Heart attack Father 52    MI  . Diabetes Neg Hx   . Colon cancer Neg Hx   . Prostate cancer Neg Hx   . Anesthesia problems Neg Hx   . Hypotension Neg Hx   . Malignant hyperthermia Neg Hx   . Pseudochol deficiency Neg Hx      Physical Exam: Filed Vitals:   11/20/12 2321 11/21/12 0137 11/21/12 0222  BP: 132/63 151/67 173/74  Pulse: 93 96 83  Temp:   97.9 F (36.6 C)  TempSrc:   Oral  Resp: 18 18 18   Height:   5\' 7"  (1.702 m)  Weight:   98.6 kg (217 lb 6 oz)  SpO2: 96%  97%   Blood pressure 173/74, pulse 83, temperature 97.9 F (36.6 C), temperature source Oral, resp. rate 18, height 5\' 7"  (1.702 m), weight 98.6 kg (217 lb 6 oz), SpO2 97.00%.  GEN:  Pleasant  patient lying in the stretcher in no  acute distress; cooperative with exam. PSYCH:  alert and oriented x4; does not appear anxious or depressed; affect is appropriate. HEENT: Mucous membranes pink and anicteric; PERRLA; EOM intact; no cervical lymphadenopathy nor thyromegaly or carotid bruit; no JVD; There were no stridor. Neck is very supple. Breasts:: Not examined CHEST WALL: No tenderness CHEST: Normal respiration, clear to auscultation bilaterally.  HEART: Regular rate and rhythm.  There are systolic murmur at LSB. No rub, or gallops.   BACK: No kyphosis or scoliosis; no CVA tenderness ABDOMEN: soft and non-tender; no masses, no organomegaly, normal abdominal bowel sounds; no pannus; no intertriginous candida. There is no rebound and no distention. Rectal Exam: Not done EXTREMITIES: No bone or joint deformity; age-appropriate arthropathy of the hands and knees; no edema; no ulcerations.  There is no calf tenderness. Genitalia: not examined PULSES: 2+ and symmetric SKIN: Normal  hydration no rash or ulceration CNS: Cranial nerves 2-12 grossly intact no focal lateralizing neurologic deficit.  Speech is fluent; uvula elevated with phonation, facial symmetry and tongue midline. DTR are normal bilaterally, cerebella exam is intact, barbinski is negative and strengths are equaled bilaterally.  No sensory loss.   Labs on Admission:  Basic Metabolic Panel:  Recent Labs Lab 11/20/12 1945  NA 140  K 4.4  CL 103  CO2 28  GLUCOSE 176*  BUN 13  CREATININE 0.91  CALCIUM 9.9   Liver Function Tests: No results found for this basename: AST, ALT, ALKPHOS, BILITOT, PROT, ALBUMIN,  in the last 168 hours  Recent Labs Lab 11/20/12 2059  LIPASE 41   No results found for this basename: AMMONIA,  in the last 168 hours CBC:  Recent Labs Lab 11/20/12 1945  WBC 10.2  HGB 15.0  HCT 42.0  MCV 87.1  PLT 236   Cardiac Enzymes:  Recent Labs Lab 11/20/12 2316  TROPONINI <0.30    CBG: No results found for this basename:  GLUCAP,  in the last 168 hours   Radiological Exams on Admission: Dg Chest 2 View  11/20/2012   *RADIOLOGY REPORT*  Clinical Data: Chest pain.  CHEST - 2 VIEW  Comparison: PA and lateral chest 08/15/2011 and 09/18/2011. CT chest 07/09/2008.  Findings: The lungs are emphysematous.  Heart size is normal.  No pneumothorax or pleural fluid.  The patient is status post aortic valve replacement.  IMPRESSION: Emphysema without acute disease.   Original Report Authenticated By: Holley Dexter, M.D.   Ct Angio Chest Aortic Dissect W &/or W/o  11/20/2012   *RADIOLOGY REPORT*  Clinical Data:  Chest pain with onset yesterday.  Worse this evening.  Shortness of breath.  History of aortic valve replacement.  CT ANGIOGRAPHY CHEST, ABDOMEN AND PELVIS  Technique:  Multidetector CT imaging through the chest, abdomen and pelvis was performed using the standard protocol during bolus administration of intravenous contrast.  Multiplanar reconstructed images including MIPs were obtained and reviewed to evaluate the vascular anatomy.  Contrast: OMNIPAQUE IOHEXOL 350 MG/ML SOLN  Comparison:  CT abdomen and pelvis 07/04/2012.  CT chest 07/09/2008.  CTA CHEST  Findings:  Noncontrast CT images of the chest demonstrate postoperative changes in the mediastinum consistent with history of aortic valve replacement.  Normal caliber thoracic aorta with extensive calcification.  Extensive calcification of the coronary arteries.  Prominent calcification around the mitral valve.  No evidence of intramural hematoma.  Contrast enhanced images demonstrate patency of the thoracic aorta. No evidence of aortic dissection.  Typical branching pattern of the great vessels.  There is evidence of focal high-grade stenosis of the left subclavian artery.  Flow is demonstrated both proximal and distal to the area of stenosis.  The central and segmental pulmonary arteries are well opacified without evidence of pulmonary embolus.  The heart size is  normal. No pericardial effusion.  Small to moderate esophageal hiatal hernia.  Fluid and air in the esophagus suggest dysmotility or reflux.  Thyroid gland is homogeneous.  No significant lymphadenopathy in the chest.  No pleural effusions.  Emphysematous changes throughout the lungs.  Atelectasis or infiltration in the lung bases bilaterally.  No pneumothorax. Airways appear patent.  Degenerative changes throughout the thoracic spine.  No destructive bone lesions appreciated.   Review of the MIP images confirms the above findings.  IMPRESSION: Extensive calcification of the thoracic aorta and coronary arteries.  No evidence of aortic aneurysm or dissection.  Focal high-grade  appearing stenosis of the proximal left subclavian artery.  Diffuse emphysematous changes in the lungs.  Atelectasis or infiltration in the lower lungs.  CTA ABDOMEN AND PELVIS  Findings:  There is an infrarenal abdominal aortic aneurysm measuring up to about 3.7 cm maximal AP diameter.  This appears stable since the previous study.  Focal moderate stenosis of the abdominal aorta below the renal artery origins and above the level of the aneurysm caused by calcification and mural thrombus. No evidence of aortic dissection.  There is extensive calcific and non calcific atherosclerotic change throughout the abdominal aorta.  The celiac axis is patent with apparent mild to moderate stenosis of the origin.  The superior mesenteric artery is patent.  Single bilateral renal arteries appear patent with moderate atherosclerotic change demonstrated.  The inferior mesenteric artery is patent.  Atherosclerotic changes throughout the right common iliac artery result and moderate diffuse narrowing.  Focal atherosclerotic change with apparent ulcerating plaque at the origin of the left common iliac artery. Both common iliac arteries, external and internal iliac arteries, and common femoral arteries are patent.  Described changes appear stable since previous  study. Renal nephrograms are symmetrical.  Cholelithiasis without inflammatory change.  The liver, spleen, pancreas, adrenal glands, kidneys, inferior vena cava, and retroperitoneal lymph nodes are unremarkable.  The stomach is not abnormally distended.  Small bowel are decompressed.  Stool filled colon without abnormal distension.  Prominent visceral adipose tissues.  No free air or free fluid in the abdomen.  Small umbilical hernia containing fat.  Pelvis:  The prostate gland is enlarged, measuring 5.4 x 5.9 cm. The bladder wall is not thickened.  No free or loculated pelvic fluid collections.  No significant pelvic lymphadenopathy.  The rectosigmoid colon is decompressed without inflammatory change. The appendix is not identified.  Mild degenerative changes in the lumbar spine.  No destructive bone lesions appreciated.   Review of the MIP images confirms the above findings.  IMPRESSION: Stable appearance of abdominal aortic aneurysm at 3.7 cm AP diameter.  Diffuse atherosclerotic change throughout the abdominal aorta and major abdominal and pelvic vessels.  Moderate stenosis of the infrarenal abdominal aorta.  Moderate stenosis of the right common iliac artery.  Ulcerated plaque formation in the left common iliac artery origin.  Cholelithiasis.  Fat containing umbilical hernia.   Original Report Authenticated By: Burman Nieves, M.D.   Ct Angio Abd/pel W/ And/or W/o  11/20/2012   *RADIOLOGY REPORT*  Clinical Data:  Chest pain with onset yesterday.  Worse this evening.  Shortness of breath.  History of aortic valve replacement.  CT ANGIOGRAPHY CHEST, ABDOMEN AND PELVIS  Technique:  Multidetector CT imaging through the chest, abdomen and pelvis was performed using the standard protocol during bolus administration of intravenous contrast.  Multiplanar reconstructed images including MIPs were obtained and reviewed to evaluate the vascular anatomy.  Contrast: OMNIPAQUE IOHEXOL 350 MG/ML SOLN  Comparison:  CT  abdomen and pelvis 07/04/2012.  CT chest 07/09/2008.  CTA CHEST  Findings:  Noncontrast CT images of the chest demonstrate postoperative changes in the mediastinum consistent with history of aortic valve replacement.  Normal caliber thoracic aorta with extensive calcification.  Extensive calcification of the coronary arteries.  Prominent calcification around the mitral valve.  No evidence of intramural hematoma.  Contrast enhanced images demonstrate patency of the thoracic aorta. No evidence of aortic dissection.  Typical branching pattern of the great vessels.  There is evidence of focal high-grade stenosis of the left subclavian artery.  Flow is  demonstrated both proximal and distal to the area of stenosis.  The central and segmental pulmonary arteries are well opacified without evidence of pulmonary embolus.  The heart size is normal. No pericardial effusion.  Small to moderate esophageal hiatal hernia.  Fluid and air in the esophagus suggest dysmotility or reflux.  Thyroid gland is homogeneous.  No significant lymphadenopathy in the chest.  No pleural effusions.  Emphysematous changes throughout the lungs.  Atelectasis or infiltration in the lung bases bilaterally.  No pneumothorax. Airways appear patent.  Degenerative changes throughout the thoracic spine.  No destructive bone lesions appreciated.   Review of the MIP images confirms the above findings.  IMPRESSION: Extensive calcification of the thoracic aorta and coronary arteries.  No evidence of aortic aneurysm or dissection.  Focal high-grade appearing stenosis of the proximal left subclavian artery.  Diffuse emphysematous changes in the lungs.  Atelectasis or infiltration in the lower lungs.  CTA ABDOMEN AND PELVIS  Findings:  There is an infrarenal abdominal aortic aneurysm measuring up to about 3.7 cm maximal AP diameter.  This appears stable since the previous study.  Focal moderate stenosis of the abdominal aorta below the renal artery origins and  above the level of the aneurysm caused by calcification and mural thrombus. No evidence of aortic dissection.  There is extensive calcific and non calcific atherosclerotic change throughout the abdominal aorta.  The celiac axis is patent with apparent mild to moderate stenosis of the origin.  The superior mesenteric artery is patent.  Single bilateral renal arteries appear patent with moderate atherosclerotic change demonstrated.  The inferior mesenteric artery is patent.  Atherosclerotic changes throughout the right common iliac artery result and moderate diffuse narrowing.  Focal atherosclerotic change with apparent ulcerating plaque at the origin of the left common iliac artery. Both common iliac arteries, external and internal iliac arteries, and common femoral arteries are patent.  Described changes appear stable since previous study. Renal nephrograms are symmetrical.  Cholelithiasis without inflammatory change.  The liver, spleen, pancreas, adrenal glands, kidneys, inferior vena cava, and retroperitoneal lymph nodes are unremarkable.  The stomach is not abnormally distended.  Small bowel are decompressed.  Stool filled colon without abnormal distension.  Prominent visceral adipose tissues.  No free air or free fluid in the abdomen.  Small umbilical hernia containing fat.  Pelvis:  The prostate gland is enlarged, measuring 5.4 x 5.9 cm. The bladder wall is not thickened.  No free or loculated pelvic fluid collections.  No significant pelvic lymphadenopathy.  The rectosigmoid colon is decompressed without inflammatory change. The appendix is not identified.  Mild degenerative changes in the lumbar spine.  No destructive bone lesions appreciated.   Review of the MIP images confirms the above findings.  IMPRESSION: Stable appearance of abdominal aortic aneurysm at 3.7 cm AP diameter.  Diffuse atherosclerotic change throughout the abdominal aorta and major abdominal and pelvic vessels.  Moderate stenosis of the  infrarenal abdominal aorta.  Moderate stenosis of the right common iliac artery.  Ulcerated plaque formation in the left common iliac artery origin.  Cholelithiasis.  Fat containing umbilical hernia.   Original Report Authenticated By: Burman Nieves, M.D.    EKG: Independently reviewed. LBBB no acute changes.   Assessment/Plan Present on Admission:  . Atypical chest pain . PERIPHERAL VASCULAR DISEASE . GERD . HYPERLIPIDEMIA . CORONARY ARTERY DISEASE . CAROTID ARTERY DISEASE  PLAN:  I suspect this is not cardiac chest pain, but he has known CAD and prior MI, so will admit for  r/out.  Will get his BP and HR into better range.  For his PAF, will continue the Pradaxa.  His medication for lipid, CAD, and GERD will be continued.  He is stable, full code, and will be admitted to telemetry under [facility] service.  Thank you for allowing me to participate in his care.  Other plans as per orders.  Code Status: FULL Unk Lightning, MD. Fernando Moyer Pager [phone] 7pm to 7am.  11/21/2012, 3:01 AM

## 2012-11-21 NOTE — Consult Note (Signed)
Referring Physician:  Primary Physician: Willow Ora, MD Primary Cardiologist: [facility] Reason for Consultation:   Chest pain  HPI: 79 year old male with a history of non-obstructive CAD had onset of chest pain 3 days ago. It is described as pressure, burning, and worse with activity. He has DOE that is chronic. He had melena once the other night, but denies any bleeding. His pain is worse with deep inspiration and also worse during meals. There is no true exertional component, it just gets worse when he moves around and breathes faster. Currently, he is uncomfortable and has not had 0/10 pain level in 3 days.  Review of Systems:     Cardiac Review of Systems: {Y] = yes [ ]  = no  Chest Pain [ y   ]  Resting SOB [   ] Exertional SOB  [ y ]  Orthopnea [  ]   Pedal Edema [   ]    Palpitations [  ] Syncope  [  ]   Presyncope [   ]  General Review of Systems: [Y] = yes [  ]=no Constitional: recent weight change [  ]; anorexia [  ]; fatigue [  ]; nausea [  ]; night sweats [  ]; fever [  ]; or chills [  ];                                                                                                                                          Dental: poor dentition[  ];    Eye : blurred vision [  ]; diplopia [   ]; vision changes [  ];  Amaurosis fugax[  ]; Resp: cough [  ];  wheezing[  ];  hemoptysis[  ]; shortness of breath[  ]; paroxysmal nocturnal dyspnea[  ]; dyspnea on exertion[  ]; or orthopnea[  ];  GI:  gallstones[  ], vomiting[  ];  dysphagia[  ]; melena[ y ];  hematochezia [  ]; heartburn[ y ];   Hx of  Colonoscopy[  ]; GU: kidney stones [  ]; hematuria[  ];   dysuria [  ];  nocturia[  ];  history of     obstruction [  ];                 Skin: rash, swelling[  ];, hair loss[  ];  peripheral edema[  ];  or itching[  ]; Musculosketetal: myalgias[  ];  joint swelling[  ];  joint erythema[  ];  joint pain[  ];  back pain[  ];  Heme/Lymph: bruising[  ];  bleeding[  ];  anemia[  ];  Neuro: TIA[  ];   headaches[  ];  stroke[  ];  vertigo[  ];  seizures[  ];   paresthesias[  ];  difficulty walking[  ];  Psych:depression[  ]; anxiety[  ];  Endocrine: diabetes[  ];  thyroid dysfunction[  ];  Immunizations: Flu [  ]; Pneumococcal[  ];  Other:  Past Medical History  Diagnosis Date  . GERD with stricture     w/hx of stricture  . PVD (peripheral vascular disease) 12/07    per cath 12/07....iliac  . Carotid artery occlusion     last u/s 3-11...Marland Kitchenper vasc.surgery 2007 cath: nonobstructive CAD. mil;d MS, trivial AoS, small AAA,  . Bowen's disease     right arm BX: referred to dermatology  . Hyperlipidemia   . Emphysema     PFT 4/10 FVC 3.45 (90%), FEV1 2.72 (106%), TLC 5.36 (93%), DLCO 74%, NO BD RESPONSE: ? OF PSEUDONORMALIZATION OF PFT FINDINGS W/BOTH OBSTRUCTIVE AND RESTRICTIVE DEFECTS.  Marland Kitchen AAA (abdominal aortic aneurysm) 05/2008    PER CARDIAC CATH  . Aortic stenosis 05/2008    PER CARDIAC CATH, MILD, MITRAL STENOSIS  . Mitral stenosis 05/2008    PER CARDIAC CATH  . Coronary artery disease 05/2008    MILD, MEDICAL MANAGEMENT  . Pulmonary hypertension   . Hypertension 02/21/11    pt denies this hx  . Dyspnea      w/u included a cath and PFT's, sxms thought to be pulmonary  . Chronic back pain   . Peripheral neuropathy     right thigh; "it's been that way for years"  . Diabetes mellitus 09/07/2011   Past Surgical History  Procedure Laterality Date  . Appendectomy    . Esophageal dilation    . Cardiac catheterization  02/2011    LAD 50, CFX OK, RCA40, EF 55%; PCWP 17, AoV  0.9 cm squared  . Endarterectomy  08-2010    Right carotid endarterectomy    . Aortic valve replacement  08/17/2011    Procedure: AORTIC VALVE REPLACEMENT (AVR);  Surgeon: Alleen Borne, MD;  Location: [facility] OR;  Service: Open Heart Surgery;  Laterality: N/A;   Current Medications: . dabigatran  150 mg Oral Q12H  . docusate sodium  100 mg Oral BID  . furosemide  40 mg Oral Daily  . pantoprazole  40 mg Oral Daily    . potassium chloride SA  20 mEq Oral Daily  . simvastatin  20 mg Oral q1800  . sodium chloride  3 mL Intravenous Q12H    Prescriptions prior to admission  Medication Sig Dispense Refill  . dabigatran (PRADAXA) 150 MG CAPS capsule Take 150 mg by mouth every 12 (twelve) hours.      . furosemide (LASIX) 40 MG tablet Take 1 tablet (40 mg total) by mouth daily.  90 tablet  4  . meclizine (ANTIVERT) 25 MG tablet Take 25 mg by mouth 3 (three) times daily as needed for dizziness.      Marland Kitchen omeprazole (PRILOSEC OTC) 20 MG tablet Take 20 mg by mouth daily.      Marland Kitchen oxyCODONE (OXY IR/ROXICODONE) 5 MG immediate release tablet Take 5 mg by mouth every 4 (four) hours as needed for pain.      . potassium chloride SA (K-DUR,KLOR-CON) 20 MEQ tablet Take 1 tablet (20 mEq total) by mouth daily.  90 tablet  4  . pravastatin (PRAVACHOL) 40 MG tablet Take 40 mg by mouth every evening.        No Known Allergies  History   Social History  . Marital Status: Widowed    Spouse Name: N/A    Number of Children: 2  . Years of Education: N/A   Occupational History  . 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 Smoking status: Former Smoker -- 2.00 packs/day for 30 years    Types: Cigarettes    Quit date: 04/02/1986  . Smokeless tobacco: Never Used     Comment: quit smoking cigarettes 04/1987  . Alcohol Use: No  . Drug Use: No  . Sexual Activity: No   Other Topics Concern  . Not on file   Social History Narrative   LIVES BY HIMSELF    HAS 2 GROWN SONS, 5 G-KIDS (Melissa usually comes with him for his appointments)           Family History  Problem Relation Age of Onset  . Heart attack Father 26    MI  . Diabetes Neg Hx   . Colon cancer Neg Hx   . Prostate cancer Neg Hx   . Anesthesia problems Neg Hx   . Hypotension Neg Hx   . Malignant hyperthermia Neg Hx   . Pseudochol deficiency Neg Hx    Family Status   Relation Status Death Age  . Father Deceased   . Mother Deceased    PHYSICAL EXAM: Filed Vitals:   11/21/12 0418  BP: 150/69  Pulse: 96  Temp: 97.4 F (36.3 C)  Resp: 18    Intake/Output Summary (Last 24 hours) at 11/21/12 1314 Last data filed at 11/21/12 0700  Gross per 24 hour  Intake    240 ml  Output      0 ml  Net    240 ml    General:  Well appearing obese male in no acute distress. No respiratory difficulty HEENT: normal; PERRLA; EOM intact    Neck: supple. no JVD. Carotids 2+ bilat; no bruits. No lymphadenopathy or thryomegaly appreciated. Cor: PMI nondisplaced. Regular rate & rhythm. No rubs, gallops; 2/6 systolic murmurs. Crisp valve click Lungs: clear Abdomen: soft, nontender, nondistended. No hepatosplenomegaly. No bruits or masses. Good bowel sounds. Extremities: no cyanosis, clubbing, rash, No edema Neuro: alert & oriented x 3, cranial nerves grossly intact. moves all 4 extremities w/o difficulty. Affect pleasant.  ECG: SR/ST  With PAC's, RBBB is old  ECHO: TEE, 08/2011, intra-op - Left ventricle: Systolic function was normal. The estimated ejection fraction was in the range of 55% to 65%. - Aortic valve: Valve mobility was restricted. There was moderate to severe stenosis. - Mitral valve: No evidence of vegetation. Mild regurgitation. - Left atrium: No evidence of thrombus in the atrial cavity or appendage. No evidence of thrombus in the appendage. - Atrial septum: No defect or patent foramen ovale was identified. - Tricuspid valve: No evidence of vegetation.  Results for orders placed during the hospital encounter of 11/20/12 (from the past 24 hour(s))  CBC     Status: None   Collection Time    11/20/12  7:45 PM      Result Value Range   WBC 10.2  4.0 - 10.5 K/uL   RBC 4.82  4.22 - 5.81 MIL/uL   Hemoglobin 15.0  13.0 - 17.0 g/dL   HCT 16.1  09.6 - 04.5 %   MCV 87.1  78.0 - 100.0 fL   MCH 31.1  26.0 - 34.0 pg   MCHC 35.7  30.0 - 36.0 g/dL    RDW 40.9  81.1 - 91.4 %   Platelets 236  150 - 400 K/uL  BASIC METABOLIC PANEL     Status: Abnormal  Collection Time    11/20/12  7:45 PM      Result Value Range   Sodium 140  135 - 145 mEq/L   Potassium 4.4  3.5 - 5.1 mEq/L   Chloride 103  96 - 112 mEq/L   CO2 28  19 - 32 mEq/L   Glucose, Bld 176 (*) 70 - 99 mg/dL   BUN 13  6 - 23 mg/dL   Creatinine, Ser 1.61  0.50 - 1.35 mg/dL   Calcium 9.9  8.4 - 09.6 mg/dL   GFR calc non Af Amer 80 (*) >90 mL/min   GFR calc Af Amer >90  >90 mL/min  PRO B NATRIURETIC PEPTIDE     Status: None   Collection Time    11/20/12  7:45 PM      Result Value Range   Pro B Natriuretic peptide (BNP) 187.4  0 - 450 pg/mL  POCT I-STAT TROPONIN I     Status: None   Collection Time    11/20/12  7:59 PM      Result Value Range   Troponin i, poc 0.00  0.00 - 0.08 ng/mL   Comment 3           LIPASE, BLOOD     Status: None   Collection Time    11/20/12  8:59 PM      Result Value Range   Lipase 41  11 - 59 U/L  TYPE AND SCREEN     Status: None   Collection Time    11/20/12 10:04 PM      Result Value Range   ABO/RH(D) A POS     Antibody Screen NEG     Sample Expiration 11/23/2012    URINALYSIS, ROUTINE W REFLEX MICROSCOPIC     Status: Abnormal   Collection Time    11/20/12 11:11 PM      Result Value Range   Color, Urine YELLOW  YELLOW   APPearance CLEAR  CLEAR   Specific Gravity, Urine >1.046 (*) 1.005 - 1.030   pH 6.5  5.0 - 8.0   Glucose, UA NEGATIVE  NEGATIVE mg/dL   Hgb urine dipstick TRACE (*) NEGATIVE   Bilirubin Urine NEGATIVE  NEGATIVE   Ketones, ur NEGATIVE  NEGATIVE mg/dL   Protein, ur NEGATIVE  NEGATIVE mg/dL   Urobilinogen, UA 0.2  0.0 - 1.0 mg/dL   Nitrite NEGATIVE  NEGATIVE   Leukocytes, UA NEGATIVE  NEGATIVE  URINE MICROSCOPIC-ADD ON     Status: None   Collection Time    11/20/12 11:11 PM      Result Value Range   Squamous Epithelial / LPF RARE  RARE   RBC / HPF 3-6  <3 RBC/hpf   Bacteria, UA RARE  RARE  TROPONIN I      Status: None   Collection Time    11/20/12 11:16 PM      Result Value Range   Troponin I <0.30  <0.30 ng/mL  TROPONIN I     Status: None   Collection Time    11/21/12  4:05 AM      Result Value Range   Troponin I <0.30  <0.30 ng/mL  TSH     Status: Abnormal   Collection Time    11/21/12  4:05 AM      Result Value Range   TSH 5.302 (*) 0.350 - 4.500 uIU/mL  TROPONIN I     Status: None   Collection Time    11/21/12  7:46 AM  Result Value Range   Troponin I <0.30  <0.30 ng/mL   Radiology:  Dg Chest 2 View 11/20/2012   *RADIOLOGY REPORT*  Clinical Data: Chest pain.  CHEST - 2 VIEW  Comparison: PA and lateral chest 08/15/2011 and 09/18/2011. CT chest 07/09/2008.  Findings: The lungs are emphysematous.  Heart size is normal.  No pneumothorax or pleural fluid.  The patient is status post aortic valve replacement.  IMPRESSION: Emphysema without acute disease.   Original Report Authenticated By: Holley Dexter, M.D.   Ct Angio Chest Aortic Dissect W &/or W/o 11/20/2012   CTA CHEST  Findings:  Noncontrast CT images of the chest demonstrate postoperative changes in the mediastinum consistent with history of aortic valve replacement.  Normal caliber thoracic aorta with extensive calcification.  Extensive calcification of the coronary arteries.  Prominent calcification around the mitral valve.  No evidence of intramural hematoma.  Contrast enhanced images demonstrate patency of the thoracic aorta. No evidence of aortic dissection.  Typical branching pattern of the great vessels.  There is evidence of focal high-grade stenosis of the left subclavian artery.  Flow is demonstrated both proximal and distal to the area of stenosis.  The central and segmental pulmonary arteries are well opacified without evidence of pulmonary embolus.  The heart size is normal. No pericardial effusion.  Small to moderate esophageal hiatal hernia.  Fluid and air in the esophagus suggest dysmotility or reflux.  Thyroid  gland is homogeneous.  No significant lymphadenopathy in the chest.  No pleural effusions.  Emphysematous changes throughout the lungs.  Atelectasis or infiltration in the lung bases bilaterally.  No pneumothorax. Airways appear patent.  Degenerative changes throughout the thoracic spine.  No destructive bone lesions appreciated.   Review of the MIP images confirms the above findings.  IMPRESSION: Extensive calcification of the thoracic aorta and coronary arteries.  No evidence of aortic aneurysm or dissection.  Focal high-grade appearing stenosis of the proximal left subclavian artery.  Diffuse emphysematous changes in the lungs.  Atelectasis or infiltration in the lower lungs.    CTA ABDOMEN AND PELVIS  Findings:  There is an infrarenal abdominal aortic aneurysm measuring up to about 3.7 cm maximal AP diameter.  This appears stable since the previous study.  Focal moderate stenosis of the abdominal aorta below the renal artery origins and above the level of the aneurysm caused by calcification and mural thrombus. No evidence of aortic dissection.  There is extensive calcific and non calcific atherosclerotic change throughout the abdominal aorta.  The celiac axis is patent with apparent mild to moderate stenosis of the origin.  The superior mesenteric artery is patent.  Single bilateral renal arteries appear patent with moderate atherosclerotic change demonstrated.  The inferior mesenteric artery is patent.  Atherosclerotic changes throughout the right common iliac artery result and moderate diffuse narrowing.  Focal atherosclerotic change with apparent ulcerating plaque at the origin of the left common iliac artery. Both common iliac arteries, external and internal iliac arteries, and common femoral arteries are patent.  Described changes appear stable since previous study. Renal nephrograms are symmetrical.  Cholelithiasis without inflammatory change.  The liver, spleen, pancreas, adrenal glands, kidneys,  inferior vena cava, and retroperitoneal lymph nodes are unremarkable.  The stomach is not abnormally distended.  Small bowel are decompressed.  Stool filled colon without abnormal distension.  Prominent visceral adipose tissues.  No free air or free fluid in the abdomen.  Small umbilical hernia containing fat.  Pelvis:  The prostate gland is enlarged, measuring  5.4 x 5.9 cm. The bladder wall is not thickened.  No free or loculated pelvic fluid collections.  No significant pelvic lymphadenopathy.  The rectosigmoid colon is decompressed without inflammatory change. The appendix is not identified.  Mild degenerative changes in the lumbar spine.  No destructive bone lesions appreciated.   Review of the MIP images confirms the above findings.  IMPRESSION: Stable appearance of abdominal aortic aneurysm at 3.7 cm AP diameter.  Diffuse atherosclerotic change throughout the abdominal aorta and major abdominal and pelvic vessels.  Moderate stenosis of the infrarenal abdominal aorta.  Moderate stenosis of the right common iliac artery.  Ulcerated plaque formation in the left common iliac artery origin.  Cholelithiasis.  Fat containing umbilical hernia.   Original Report Authenticated By: Burman Nieves, M.D.    ASSESSMENT: 1.  Atypical chest pain 2.  HYPERLIPIDEMIA 3.  CORONARY ARTERY DISEASE 4.  CAROTID ARTERY DISEASE 5.  PERIPHERAL VASCULAR DISEASE 6.  GERD 7.  S/P AVR (aortic valve replacement) 8.  PAF (paroxysmal atrial fibrillation)  PLAN/DISCUSSION: Mr. Mohs has atypical symptoms. His enzymes have been negative and ECG not acute despite > 48 hours of pain. We will schedule a Lexiscan CL in am. There is concern for a GI origin to his symptoms. We will try a GI cocktail, and increase the Prilosec to 40 mg BID at least short-term. Can change the Pradaxa to Eliquis if insurance will cover it for less GI side effects. Consider GI consult for EGD.   Theodore Demark, PA-C 11/21/2012 1:14 PM Beeper  [phone]  Patient seen and examined with Theodore Demark, PA-C. We discussed all aspects of the encounter. I agree with the assessment and plan as stated above.   I reviewed cath from 2012 and CAD mild to moderate. Symptoms have typical and atypical features. ECG and CE negative despite prolonged pain. He is on Pradaxa and reports a recent episode of melena. My suspicion is that he has PUD likely related to Pradaxa. We will order stool guaiac and increase PPI to bid. Consider having GI see.   We will plan Myoview in the am. Case Manager to see if he can afford switching to Eliquis which likely is safer from GI perspective.  Julliana Whitmyer,MD 1:43 PM

## 2012-11-21 NOTE — Care Management Note (Signed)
    Page 1 of 2   11/24/2012     3:53:13 PM   CARE MANAGEMENT NOTE 11/24/2012  Patient:  Fernando Moyer, Fernando Moyer   Account Number:  [id number]  Date Initiated:  11/21/2012  Documentation initiated by:  Cris Talavera  Subjective/Objective Assessment:   PT ADM ON 11/20/12 WITH CHEST PAIN.  PTA, PT INDEPENDENT, LIVES WITH GRANDDAUGHTER.     Action/Plan:   WILL FOLLOW FOR HOME NEEDS AS PT PROGRESSES.   Anticipated DC Date:  11/22/2012   Anticipated DC Plan:  HOME/SELF CARE      DC Planning Services  CM consult      Choice offered to / List presented to:             Status of service:  Completed, signed off Medicare Important Message given?   (If response is "NO", the following Medicare IM given date fields will be blank) Date Medicare IM given:   Date Additional Medicare IM given:    Discharge Disposition:  HOME/SELF CARE  Per UR Regulation:  Reviewed for med. necessity/level of care/duration of stay  If discussed at Long Length of Stay Meetings, dates discussed:    Comments:  11/24/12 Rosalita Chessman [phone] $17 co-pay, no auth required, [facility], [facility], and [facility] in [place] are all in network DR [facility] [place] MADE AWARE OF COPAY INFORMATION.  SHE WILL CALL IN RX FOR PT.   11/22/12 13:00 MD called CM requesting CMA to find out co-pay cost for Eliquis prior to giving pt 30-day free trial card.  MD is requesting CMA to do a benefit request for this med on Monday 11-24-12 and then call MD Dr. Donna Bernard [phone] with the cost.  Dr. Donna Bernard said if the cost was "reasonable" she would  call prescription into pharmacy. Freddy Jaksch, BSN, CM   11/21/12 1500 Pt. did not meet services to meet for Inpatient stay.  Sent utilization review to [facility] and Dr. Alice Reichert determined pt. meets for Observation.  This NCM spoke with pt. and explained Code 44, and the Medicare guidelines.  Pt. signed the Medicare Outpatient Observation Information sheet and this NCM gave pt. a copy of the document.  Pt.  may dc home tomorrow after further testing. Tera Mater, RN, BSN NCM [phone]

## 2012-11-21 NOTE — Progress Notes (Signed)
[facility] PROGRESS NOTE  JERIS ROSER ZOX:[id number] DOB: 1933-11-07 DOA: 11/20/2012 PCP: Willow Ora, MD I have seen and examined pt who is a 79yo admitted this am by Dr LE with h/o MI, CAD, GERD, PAF, hx of bioprosthetic valve replacement presenting with right sided chest pan and some sob- which he states is longstanding. Troponins neg, and he denies further CP. CTA was neg. I have consulted cardiology for further recommendations/ possible stress test, will continue other management plan as per Dr Conley Rolls and follow.      Kela Millin  [facility] Pager [phone]. If 7PM-7AM, please contact night-coverage at [email or web address], password [facility] 11/21/2012, 8:56 AM  LOS: 1 day

## 2012-11-21 NOTE — ED Provider Notes (Signed)
CSN: [id number]     Arrival date & time 11/20/12  1932 History     First MD Initiated Contact with Patient 11/20/12 2019     Chief Complaint  Patient presents with  . Chest Pain   (Consider location/radiation/quality/duration/timing/severity/associated sxs/prior Treatment) HPI  79 year old male with chest pain. Onset yesterday. Intermittent. Pain is substernal and does not radiate. Feels achy deep sharp pain. No appreciable exacerbating relieving factors. Pain most recently started between one and 2:00 this afternoon. It has waxed and waned since onset, but has not completely resolved. No fevers or chills. No nausea or vomiting. No shortness of breath. No diaphoresis. No unusual leg pain or swelling. Nonobstructive coronary artery disease based on catheterization in 2010. Status post aortic valve replacement over a year ago. He is on Pradaxa. Cardiologist is Dr. Excell Seltzer. He reports compliance with meds. No intervention prior to arrival.    Past Medical History  Diagnosis Date  . GERD with stricture     w/hx of stricture  . PVD (peripheral vascular disease) 12/07    per cath 12/07....iliac  . Carotid artery occlusion     last u/s 3-11...Marland Kitchenper vasc.surgery 2007 cath: nonobstructive CAD. mil;d MS, trivial AoS, small AAA,  . Bowen's disease     right arm BX: referred to dermatology  . Hyperlipidemia   . Emphysema     PFT 4/10 FVC 3.45 (90%), FEV1 2.72 (106%), TLC 5.36 (93%), DLCO 74%, NO BD RESPONSE: ? OF PSEUDONORMALIZATION OF PFT FINDINGS W/BOTH OBSTRUCTIVE AND RESTRICTIVE DEFECTS.  Marland Kitchen AAA (abdominal aortic aneurysm) 05/2008    PER CARDIAC CATH  . Aortic stenosis 05/2008    PER CARDIAC CATH, MILD, MITRAL STENOSIS  . Mitral stenosis 05/2008    PER CARDIAC CATH  . Coronary artery disease 05/2008    MILD, MEDICAL MANAGEMENT  . Pulmonary hypertension   . Hypertension 02/21/11    pt denies this hx  . Dyspnea      w/u included a cath and PFT's, sxms thought to be pulmonary  . Chronic back  pain   . Peripheral neuropathy     right thigh; "it's been that way for years"  . Diabetes mellitus 09/07/2011   Past Surgical History  Procedure Laterality Date  . Appendectomy    . Esophageal dilation    . Cardiac catheterization  02/27/06    DR. DOWNEY: CORONARY ANGIOGRAPHY, ABDOMINAL AORTOGRAPHY  . Endarterectomy  08-2010    Right carotid endarterectomy    . Aortic valve replacement  08/17/2011    Procedure: AORTIC VALVE REPLACEMENT (AVR);  Surgeon: Alleen Borne, MD;  Location: [facility] OR;  Service: Open Heart Surgery;  Laterality: N/A;   Family History  Problem Relation Age of Onset  . Heart attack Father 60    MI  . Diabetes Neg Hx   . Colon cancer Neg Hx   . Prostate cancer Neg Hx   . Anesthesia problems Neg Hx   . Hypotension Neg Hx   . Malignant hyperthermia Neg Hx   . Pseudochol deficiency Neg Hx    History  Substance Use Topics  . Smoking status: Former Smoker -- 2.00 packs/day for 30 years    Types: Cigarettes    Quit date: 04/02/1986  . Smokeless tobacco: Never Used     Comment: quit smoking cigarettes 04/1987  . Alcohol Use: No    Review of Systems  All systems reviewed and negative, other than as noted in HPI.   Allergies  Review of patient's allergies indicates no known  allergies.  Home Medications   Current Outpatient Rx  Name  Route  Sig  Dispense  Refill  . dabigatran (PRADAXA) 150 MG CAPS capsule   Oral   Take 150 mg by mouth every 12 (twelve) hours.         . furosemide (LASIX) 40 MG tablet   Oral   Take 1 tablet (40 mg total) by mouth daily.   90 tablet   4   . meclizine (ANTIVERT) 25 MG tablet   Oral   Take 25 mg by mouth 3 (three) times daily as needed for dizziness.         Marland Kitchen omeprazole (PRILOSEC OTC) 20 MG tablet   Oral   Take 20 mg by mouth daily.         Marland Kitchen oxyCODONE (OXY IR/ROXICODONE) 5 MG immediate release tablet   Oral   Take 5 mg by mouth every 4 (four) hours as needed for pain.         . potassium chloride SA  (K-DUR,KLOR-CON) 20 MEQ tablet   Oral   Take 1 tablet (20 mEq total) by mouth daily.   90 tablet   4   . pravastatin (PRAVACHOL) 40 MG tablet   Oral   Take 40 mg by mouth every evening.           BP 132/63  Pulse 93  Resp 18  SpO2 96% Physical Exam  Nursing note and vitals reviewed. Constitutional: He appears well-developed and well-nourished.  Laying in bed. Uncomfortable appearing.   HENT:  Head: Normocephalic and atraumatic.  Eyes: Conjunctivae are normal. Right eye exhibits no discharge. Left eye exhibits no discharge.  Neck: Neck supple.  Cardiovascular: Normal rate, regular rhythm and normal heart sounds.  Exam reveals no gallop and no friction rub.   No murmur heard. Pulmonary/Chest: Effort normal and breath sounds normal. No respiratory distress.  Well-healed sternotomy. Chest pain is nonreproducible.  Abdominal: Soft. He exhibits no distension. There is no tenderness.  Musculoskeletal: He exhibits no edema and no tenderness.  Lower extremities symmetric as compared to each other. No calf tenderness. Negative Homan's. No palpable cords.   Neurological: He is alert.  Skin: Skin is warm and dry.  Psychiatric: He has a normal mood and affect. His behavior is normal. Thought content normal.    ED Course   Procedures (including critical care time)  Labs Reviewed  BASIC METABOLIC PANEL - Abnormal; Notable for the following:    Glucose, Bld 176 (*)    GFR calc non Af Amer 80 (*)    All other components within normal limits  CBC  PRO B NATRIURETIC PEPTIDE  LIPASE, BLOOD  TROPONIN I  URINALYSIS, ROUTINE W REFLEX MICROSCOPIC  POCT I-STAT TROPONIN I  TYPE AND SCREEN   Dg Chest 2 View  11/20/2012   *RADIOLOGY REPORT*  Clinical Data: Chest pain.  CHEST - 2 VIEW  Comparison: PA and lateral chest 08/15/2011 and 09/18/2011. CT chest 07/09/2008.  Findings: The lungs are emphysematous.  Heart size is normal.  No pneumothorax or pleural fluid.  The patient is status post  aortic valve replacement.  IMPRESSION: Emphysema without acute disease.   Original Report Authenticated By: Holley Dexter, M.D.   Ct Angio Chest Aortic Dissect W &/or W/o  11/20/2012   *RADIOLOGY REPORT*  Clinical Data:  Chest pain with onset yesterday.  Worse this evening.  Shortness of breath.  History of aortic valve replacement.  CT ANGIOGRAPHY CHEST, ABDOMEN AND PELVIS  Technique:  Multidetector CT imaging through the chest, abdomen and pelvis was performed using the standard protocol during bolus administration of intravenous contrast.  Multiplanar reconstructed images including MIPs were obtained and reviewed to evaluate the vascular anatomy.  Contrast: OMNIPAQUE IOHEXOL 350 MG/ML SOLN  Comparison:  CT abdomen and pelvis 07/04/2012.  CT chest 07/09/2008.  CTA CHEST  Findings:  Noncontrast CT images of the chest demonstrate postoperative changes in the mediastinum consistent with history of aortic valve replacement.  Normal caliber thoracic aorta with extensive calcification.  Extensive calcification of the coronary arteries.  Prominent calcification around the mitral valve.  No evidence of intramural hematoma.  Contrast enhanced images demonstrate patency of the thoracic aorta. No evidence of aortic dissection.  Typical branching pattern of the great vessels.  There is evidence of focal high-grade stenosis of the left subclavian artery.  Flow is demonstrated both proximal and distal to the area of stenosis.  The central and segmental pulmonary arteries are well opacified without evidence of pulmonary embolus.  The heart size is normal. No pericardial effusion.  Small to moderate esophageal hiatal hernia.  Fluid and air in the esophagus suggest dysmotility or reflux.  Thyroid gland is homogeneous.  No significant lymphadenopathy in the chest.  No pleural effusions.  Emphysematous changes throughout the lungs.  Atelectasis or infiltration in the lung bases bilaterally.  No pneumothorax. Airways  appear patent.  Degenerative changes throughout the thoracic spine.  No destructive bone lesions appreciated.   Review of the MIP images confirms the above findings.  IMPRESSION: Extensive calcification of the thoracic aorta and coronary arteries.  No evidence of aortic aneurysm or dissection.  Focal high-grade appearing stenosis of the proximal left subclavian artery.  Diffuse emphysematous changes in the lungs.  Atelectasis or infiltration in the lower lungs.  CTA ABDOMEN AND PELVIS  Findings:  There is an infrarenal abdominal aortic aneurysm measuring up to about 3.7 cm maximal AP diameter.  This appears stable since the previous study.  Focal moderate stenosis of the abdominal aorta below the renal artery origins and above the level of the aneurysm caused by calcification and mural thrombus. No evidence of aortic dissection.  There is extensive calcific and non calcific atherosclerotic change throughout the abdominal aorta.  The celiac axis is patent with apparent mild to moderate stenosis of the origin.  The superior mesenteric artery is patent.  Single bilateral renal arteries appear patent with moderate atherosclerotic change demonstrated.  The inferior mesenteric artery is patent.  Atherosclerotic changes throughout the right common iliac artery result and moderate diffuse narrowing.  Focal atherosclerotic change with apparent ulcerating plaque at the origin of the left common iliac artery. Both common iliac arteries, external and internal iliac arteries, and common femoral arteries are patent.  Described changes appear stable since previous study. Renal nephrograms are symmetrical.  Cholelithiasis without inflammatory change.  The liver, spleen, pancreas, adrenal glands, kidneys, inferior vena cava, and retroperitoneal lymph nodes are unremarkable.  The stomach is not abnormally distended.  Small bowel are decompressed.  Stool filled colon without abnormal distension.  Prominent visceral adipose tissues.   No free air or free fluid in the abdomen.  Small umbilical hernia containing fat.  Pelvis:  The prostate gland is enlarged, measuring 5.4 x 5.9 cm. The bladder wall is not thickened.  No free or loculated pelvic fluid collections.  No significant pelvic lymphadenopathy.  The rectosigmoid colon is decompressed without inflammatory change. The appendix is not identified.  Mild degenerative changes in the lumbar spine.  No destructive bone lesions appreciated.   Review of the MIP images confirms the above findings.  IMPRESSION: Stable appearance of abdominal aortic aneurysm at 3.7 cm AP diameter.  Diffuse atherosclerotic change throughout the abdominal aorta and major abdominal and pelvic vessels.  Moderate stenosis of the infrarenal abdominal aorta.  Moderate stenosis of the right common iliac artery.  Ulcerated plaque formation in the left common iliac artery origin.  Cholelithiasis.  Fat containing umbilical hernia.   Original Report Authenticated By: Burman Nieves, M.D.   Ct Angio Abd/pel W/ And/or W/o  11/20/2012   *RADIOLOGY REPORT*  Clinical Data:  Chest pain with onset yesterday.  Worse this evening.  Shortness of breath.  History of aortic valve replacement.  CT ANGIOGRAPHY CHEST, ABDOMEN AND PELVIS  Technique:  Multidetector CT imaging through the chest, abdomen and pelvis was performed using the standard protocol during bolus administration of intravenous contrast.  Multiplanar reconstructed images including MIPs were obtained and reviewed to evaluate the vascular anatomy.  Contrast: OMNIPAQUE IOHEXOL 350 MG/ML SOLN  Comparison:  CT abdomen and pelvis 07/04/2012.  CT chest 07/09/2008.  CTA CHEST  Findings:  Noncontrast CT images of the chest demonstrate postoperative changes in the mediastinum consistent with history of aortic valve replacement.  Normal caliber thoracic aorta with extensive calcification.  Extensive calcification of the coronary arteries.  Prominent calcification around the mitral  valve.  No evidence of intramural hematoma.  Contrast enhanced images demonstrate patency of the thoracic aorta. No evidence of aortic dissection.  Typical branching pattern of the great vessels.  There is evidence of focal high-grade stenosis of the left subclavian artery.  Flow is demonstrated both proximal and distal to the area of stenosis.  The central and segmental pulmonary arteries are well opacified without evidence of pulmonary embolus.  The heart size is normal. No pericardial effusion.  Small to moderate esophageal hiatal hernia.  Fluid and air in the esophagus suggest dysmotility or reflux.  Thyroid gland is homogeneous.  No significant lymphadenopathy in the chest.  No pleural effusions.  Emphysematous changes throughout the lungs.  Atelectasis or infiltration in the lung bases bilaterally.  No pneumothorax. Airways appear patent.  Degenerative changes throughout the thoracic spine.  No destructive bone lesions appreciated.   Review of the MIP images confirms the above findings.  IMPRESSION: Extensive calcification of the thoracic aorta and coronary arteries.  No evidence of aortic aneurysm or dissection.  Focal high-grade appearing stenosis of the proximal left subclavian artery.  Diffuse emphysematous changes in the lungs.  Atelectasis or infiltration in the lower lungs.  CTA ABDOMEN AND PELVIS  Findings:  There is an infrarenal abdominal aortic aneurysm measuring up to about 3.7 cm maximal AP diameter.  This appears stable since the previous study.  Focal moderate stenosis of the abdominal aorta below the renal artery origins and above the level of the aneurysm caused by calcification and mural thrombus. No evidence of aortic dissection.  There is extensive calcific and non calcific atherosclerotic change throughout the abdominal aorta.  The celiac axis is patent with apparent mild to moderate stenosis of the origin.  The superior mesenteric artery is patent.  Single bilateral renal arteries appear  patent with moderate atherosclerotic change demonstrated.  The inferior mesenteric artery is patent.  Atherosclerotic changes throughout the right common iliac artery result and moderate diffuse narrowing.  Focal atherosclerotic change with apparent ulcerating plaque at the origin of the left common iliac artery. Both common iliac arteries, external and internal iliac arteries,  and common femoral arteries are patent.  Described changes appear stable since previous study. Renal nephrograms are symmetrical.  Cholelithiasis without inflammatory change.  The liver, spleen, pancreas, adrenal glands, kidneys, inferior vena cava, and retroperitoneal lymph nodes are unremarkable.  The stomach is not abnormally distended.  Small bowel are decompressed.  Stool filled colon without abnormal distension.  Prominent visceral adipose tissues.  No free air or free fluid in the abdomen.  Small umbilical hernia containing fat.  Pelvis:  The prostate gland is enlarged, measuring 5.4 x 5.9 cm. The bladder wall is not thickened.  No free or loculated pelvic fluid collections.  No significant pelvic lymphadenopathy.  The rectosigmoid colon is decompressed without inflammatory change. The appendix is not identified.  Mild degenerative changes in the lumbar spine.  No destructive bone lesions appreciated.   Review of the MIP images confirms the above findings.  IMPRESSION: Stable appearance of abdominal aortic aneurysm at 3.7 cm AP diameter.  Diffuse atherosclerotic change throughout the abdominal aorta and major abdominal and pelvic vessels.  Moderate stenosis of the infrarenal abdominal aorta.  Moderate stenosis of the right common iliac artery.  Ulcerated plaque formation in the left common iliac artery origin.  Cholelithiasis.  Fat containing umbilical hernia.   Original Report Authenticated By: Burman Nieves, M.D.   EKG:  Rhythm: normal sinus Vent. rate 96 BPM PR interval 130 ms Bifasicular block QRS duration 130 ms QT/QTc  376/475 ms ST segments: NS ST changes   1. Chest pain     MDM  79yM with CP. I suspect not ACS, although pt does have some risk factors. Prior nonobstructive cath but has been approximately 4 years since last one. CT neg for dissection, PE, pneumothorax, pneumonia or other explaining pathology. EKG stable from 4 weeks ago. Pain improved with meds, although still having it. Will admit for r/o.   Raeford Razor, MD 11/25/12 1032

## 2012-11-22 ENCOUNTER — Inpatient Hospital Stay (HOSPITAL_COMMUNITY): Payer: Medicare Other

## 2012-11-22 DIAGNOSIS — Z954 Presence of other heart-valve replacement: Secondary | ICD-10-CM

## 2012-11-22 DIAGNOSIS — I4892 Unspecified atrial flutter: Secondary | ICD-10-CM | POA: Diagnosis not present

## 2012-11-22 DIAGNOSIS — R0789 Other chest pain: Secondary | ICD-10-CM | POA: Diagnosis not present

## 2012-11-22 DIAGNOSIS — R079 Chest pain, unspecified: Secondary | ICD-10-CM

## 2012-11-22 MED ORDER — DSS 100 MG PO CAPS: 100.0000 mg | ORAL_CAPSULE | Freq: Two times a day (BID) | ORAL | Status: DC

## 2012-11-22 MED ORDER — OMEPRAZOLE MAGNESIUM 20 MG PO TBEC: 40.0000 mg | DELAYED_RELEASE_TABLET | Freq: Two times a day (BID) | ORAL | Status: DC

## 2012-11-22 MED ORDER — TECHNETIUM TC 99M SESTAMIBI GENERIC - CARDIOLITE
10.0000 | Freq: Once | INTRAVENOUS | Status: AC | PRN
  Administered 2012-11-22: 10 via INTRAVENOUS

## 2012-11-22 MED ORDER — TECHNETIUM TC 99M SESTAMIBI GENERIC - CARDIOLITE
30.0000 | Freq: Once | INTRAVENOUS | Status: AC | PRN
  Administered 2012-11-22: 30 via INTRAVENOUS

## 2012-11-22 NOTE — Discharge Summary (Signed)
Physician Discharge Summary  Fernando Moyer:[id number] DOB: 07/01/1933 DOA: 11/20/2012  PCP: Willow Ora, MD  Admit date: 11/20/2012 Discharge date: 11/22/2012  Time spent: >30 minutes  Recommendations for Outpatient Follow-up:  Follow-up Information   Follow up with Willow Ora, MD. ( as scheduled)    Specialty:  Internal Medicine   Contact information:   [phone] [facility] [address] [place] [address] [phone]       Follow up with Tonny Bollman, MD. (next week, call Monday as directed)    Specialty:  Cardiology   Contact information:   [address] [place] [address] [phone]        Discharge Diagnoses:  Principal Problem:   Atypical chest pain Active Problems:   HYPERLIPIDEMIA   CORONARY ARTERY DISEASE   CAROTID ARTERY DISEASE   PERIPHERAL VASCULAR DISEASE   GERD   S/P AVR (aortic valve replacement)   PAF (paroxysmal atrial fibrillation)   Discharge Condition: improved/stable  Diet recommendation: heart healthy  Filed Weights   11/21/12 0222  Weight: 98.6 kg (217 lb 6 oz)    History of present illness:  Fernando Moyer is an 79 y.o. male with hx of prior MI, CAD, coratid stenosis being followed by vascular sx, GERD, DM, Paroxysmal aflutter/fib on Pradaxa, hx of bioprosthetic valve replacemnt, presents to the ER with 2 days hx of severe right sided chest pain. He had some shortness of breath as well. There were some financial stress at home according to his son. He felt markedly better in the ER without significant treatment, except pain medicine IV, and his SOB resolved as well. EKG showed LBBB, and troponin was negative. He also had a chest CTA which did not show P embolism nor dissecting vessels. Hospitalist was asked to admit him for atypical chest pain, cardiac r/out.   Hospital Course:  Present on Admission:  . Atypical chest pain -Cardiac enzymes negative x3, chest pain resolved. -Appreciate cardiology  assistance, stress test done and negative for ischemia -Discussed patient with Dr. Purvis Sheffield- on-call cardiologist and he states ok to discharge home from his standpoint -The impression was that this pain was GI related and he is to continue Prilosec>>increased to 40 twice a day and followup with his PCP -Noted cardiology input that his pain could be from Pradaxa associated PUD-that his hemoglobin is stable 15, and his symptoms have resolved on Prilosec as above thus emergent EGD not indicated at this time. Patient to followup with [place] outpatient MDs for referral to GI outpatient for further evaluation/possible endoscopy as appropriate. -I consulted case manager to look into if patient can afford switching Pradaxa to ELiquis as per Dr Prescott Gum recs- but they state they do not have all the resources on weekends to be able to make that assessment including his co-pay (unless he is switched already and the prescription is taken to the pharmacy). I discussed this with Dr Purvis Sheffield and he recommends to have patient call his cardiologist/Dr. Earmon Phoenix office on Monday and he can have his office look into this and switch him once the determination of a affordability is made. This was discussed with patient and his daughter and they voiced understanding and will call Dr. Excell Seltzer on Monday.  Marland KitchenPAF (paroxysmal atrial fibrillation) -He is to continue his outpatient medications and follow up with Dr. Excell Seltzer as discussed above  . GERD -PPI increased to 40 BID as discussed above . HYPERLIPIDEMIA -continue statin . CORONARY ARTERY DISEASE -stress test neg is  neg as above .S/P AVR (aortic valve replacement)    Procedures:  Stress test IMPRESSION:  1. Negative for pharmacologic-stress induced ischemia.  2. Left ventricular ejection fraction 62%.  Consultations:  cardiology  Discharge Exam: Filed Vitals:   11/22/12 0952  BP: 132/47  Pulse: 99  Temp:   Resp:     Exam:  General: alert & oriented x  3 In NAD Cardiovascular: RRR, nl S1 s2 Respiratory: CTAB Abdomen: soft +BS NT/ND, no masses palpable Extremities: No cyanosis and no edema    Discharge Instructions  Discharge Orders   Future Appointments Provider Department Dept Phone   12/26/2012 11:00 AM Wanda Plump, MD [facility]  [place] [phone]   04/08/2013 1:00 PM [facility] [phone]   04/08/2013 2:15 PM Chuck Hint, MD [facility] [phone]   Future Orders Complete By Expires   Diet - low sodium heart healthy  As directed    Increase activity slowly  As directed        Medication List         dabigatran 150 MG Caps capsule  Commonly known as:  PRADAXA  Take 150 mg by mouth every 12 (twelve) hours.     DSS 100 MG Caps  Take 100 mg by mouth 2 (two) times daily.     furosemide 40 MG tablet  Commonly known as:  LASIX  Take 1 tablet (40 mg total) by mouth daily.     meclizine 25 MG tablet  Commonly known as:  ANTIVERT  Take 25 mg by mouth 3 (three) times daily as needed for dizziness.     omeprazole 20 MG tablet  Commonly known as:  PRILOSEC OTC  Take 2 tablets (40 mg total) by mouth 2 (two) times daily.     oxyCODONE 5 MG immediate release tablet  Commonly known as:  Oxy IR/ROXICODONE  Take 5 mg by mouth every 4 (four) hours as needed for pain.     potassium chloride SA 20 MEQ tablet  Commonly known as:  K-DUR,KLOR-CON  Take 1 tablet (20 mEq total) by mouth daily.     pravastatin 40 MG tablet  Commonly known as:  PRAVACHOL  Take 40 mg by mouth every evening.       No Known Allergies     Follow-up Information   Follow up with Willow Ora, MD. ( as scheduled)    Specialty:  Internal Medicine   Contact information:   [phone] [address] [facility] [address] [place] [address] [phone]       Follow up with Tonny Bollman, MD. (next week, call Monday as directed)    Specialty:   Cardiology   Contact information:   [address] [place] [address] [phone]        The results of significant diagnostics from this hospitalization (including imaging, microbiology, ancillary and laboratory) are listed below for reference.    Significant Diagnostic Studies: Dg Chest 2 View  11/20/2012   *RADIOLOGY REPORT*  Clinical Data: Chest pain.  CHEST - 2 VIEW  Comparison: PA and lateral chest 08/15/2011 and 09/18/2011. CT chest 07/09/2008.  Findings: The lungs are emphysematous.  Heart size is normal.  No pneumothorax or pleural fluid.  The patient is status post aortic valve replacement.  IMPRESSION: Emphysema without acute disease.   Original Report Authenticated By: Holley Dexter, M.D.   Nm Myocar Multi W/spect W/wall Motion / Ef  11/22/2012   *  RADIOLOGY REPORT*  Clinical data: Chest pain  NUCLEAR MEDICINE MYOCARDIAL PERFUSION IMAGING NUCLEAR MEDICINE LEFT VENTRICULAR WALL MOTION ANALYSIS NUCLEAR MEDICINE LEFT VENTRICULAR EJECTION FRACTION CALCULATION  Technique: Standard single day myocardial SPECT imaging was performed after resting intravenous injection of Tc-70m sestamibi. After intravenous infusion of Lexiscan (regadenoson) under supervision of cardiology staff, sestamibiwas injected intravenously and standard myocardial SPECT imaging was performed. Quantitative gated imaging was also performed to evaluate left ventricular wall motion and estimate left ventricular ejection fraction.  Radiopharmaceutical: 10+30 mCi Tc2m sestamibiIV.  Comparison: 01/17/2000 by report only  Findings:    The stress SPECT images demonstrate physiologic distribution of radiopharmaceutical. Rest images demonstrate no perfusion defects. The gated stress SPECT images demonstrate normal left ventricular myocardial thickening.  No focal wall motion abnormality is seen. Calculated left ventricular end-diastolic volume 90ml, end-systolic volume 34ml, ejection fraction of 62%.   IMPRESSION:  1. Negative for pharmacologic-stress induced ischemia.  2. Left ventricular ejection fraction 62%.   Original Report Authenticated By: D. Andria Rhein, MD   Ct Angio Chest Aortic Dissect W &/or W/o  11/20/2012   *RADIOLOGY REPORT*  Clinical Data:  Chest pain with onset yesterday.  Worse this evening.  Shortness of breath.  History of aortic valve replacement.  CT ANGIOGRAPHY CHEST, ABDOMEN AND PELVIS  Technique:  Multidetector CT imaging through the chest, abdomen and pelvis was performed using the standard protocol during bolus administration of intravenous contrast.  Multiplanar reconstructed images including MIPs were obtained and reviewed to evaluate the vascular anatomy.  Contrast: OMNIPAQUE IOHEXOL 350 MG/ML SOLN  Comparison:  CT abdomen and pelvis 07/04/2012.  CT chest 07/09/2008.  CTA CHEST  Findings:  Noncontrast CT images of the chest demonstrate postoperative changes in the mediastinum consistent with history of aortic valve replacement.  Normal caliber thoracic aorta with extensive calcification.  Extensive calcification of the coronary arteries.  Prominent calcification around the mitral valve.  No evidence of intramural hematoma.  Contrast enhanced images demonstrate patency of the thoracic aorta. No evidence of aortic dissection.  Typical branching pattern of the great vessels.  There is evidence of focal high-grade stenosis of the left subclavian artery.  Flow is demonstrated both proximal and distal to the area of stenosis.  The central and segmental pulmonary arteries are well opacified without evidence of pulmonary embolus.  The heart size is normal. No pericardial effusion.  Small to moderate esophageal hiatal hernia.  Fluid and air in the esophagus suggest dysmotility or reflux.  Thyroid gland is homogeneous.  No significant lymphadenopathy in the chest.  No pleural effusions.  Emphysematous changes throughout the lungs.  Atelectasis or infiltration in the lung bases  bilaterally.  No pneumothorax. Airways appear patent.  Degenerative changes throughout the thoracic spine.  No destructive bone lesions appreciated.   Review of the MIP images confirms the above findings.  IMPRESSION: Extensive calcification of the thoracic aorta and coronary arteries.  No evidence of aortic aneurysm or dissection.  Focal high-grade appearing stenosis of the proximal left subclavian artery.  Diffuse emphysematous changes in the lungs.  Atelectasis or infiltration in the lower lungs.  CTA ABDOMEN AND PELVIS  Findings:  There is an infrarenal abdominal aortic aneurysm measuring up to about 3.7 cm maximal AP diameter.  This appears stable since the previous study.  Focal moderate stenosis of the abdominal aorta below the renal artery origins and above the level of the aneurysm caused by calcification and mural thrombus. No evidence of aortic dissection.  There is extensive calcific and non  calcific atherosclerotic change throughout the abdominal aorta.  The celiac axis is patent with apparent mild to moderate stenosis of the origin.  The superior mesenteric artery is patent.  Single bilateral renal arteries appear patent with moderate atherosclerotic change demonstrated.  The inferior mesenteric artery is patent.  Atherosclerotic changes throughout the right common iliac artery result and moderate diffuse narrowing.  Focal atherosclerotic change with apparent ulcerating plaque at the origin of the left common iliac artery. Both common iliac arteries, external and internal iliac arteries, and common femoral arteries are patent.  Described changes appear stable since previous study. Renal nephrograms are symmetrical.  Cholelithiasis without inflammatory change.  The liver, spleen, pancreas, adrenal glands, kidneys, inferior vena cava, and retroperitoneal lymph nodes are unremarkable.  The stomach is not abnormally distended.  Small bowel are decompressed.  Stool filled colon without abnormal distension.   Prominent visceral adipose tissues.  No free air or free fluid in the abdomen.  Small umbilical hernia containing fat.  Pelvis:  The prostate gland is enlarged, measuring 5.4 x 5.9 cm. The bladder wall is not thickened.  No free or loculated pelvic fluid collections.  No significant pelvic lymphadenopathy.  The rectosigmoid colon is decompressed without inflammatory change. The appendix is not identified.  Mild degenerative changes in the lumbar spine.  No destructive bone lesions appreciated.   Review of the MIP images confirms the above findings.  IMPRESSION: Stable appearance of abdominal aortic aneurysm at 3.7 cm AP diameter.  Diffuse atherosclerotic change throughout the abdominal aorta and major abdominal and pelvic vessels.  Moderate stenosis of the infrarenal abdominal aorta.  Moderate stenosis of the right common iliac artery.  Ulcerated plaque formation in the left common iliac artery origin.  Cholelithiasis.  Fat containing umbilical hernia.   Original Report Authenticated By: Burman Nieves, M.D.   Ct Angio Abd/pel W/ And/or W/o  11/20/2012   *RADIOLOGY REPORT*  Clinical Data:  Chest pain with onset yesterday.  Worse this evening.  Shortness of breath.  History of aortic valve replacement.  CT ANGIOGRAPHY CHEST, ABDOMEN AND PELVIS  Technique:  Multidetector CT imaging through the chest, abdomen and pelvis was performed using the standard protocol during bolus administration of intravenous contrast.  Multiplanar reconstructed images including MIPs were obtained and reviewed to evaluate the vascular anatomy.  Contrast: OMNIPAQUE IOHEXOL 350 MG/ML SOLN  Comparison:  CT abdomen and pelvis 07/04/2012.  CT chest 07/09/2008.  CTA CHEST  Findings:  Noncontrast CT images of the chest demonstrate postoperative changes in the mediastinum consistent with history of aortic valve replacement.  Normal caliber thoracic aorta with extensive calcification.  Extensive calcification of the coronary arteries.   Prominent calcification around the mitral valve.  No evidence of intramural hematoma.  Contrast enhanced images demonstrate patency of the thoracic aorta. No evidence of aortic dissection.  Typical branching pattern of the great vessels.  There is evidence of focal high-grade stenosis of the left subclavian artery.  Flow is demonstrated both proximal and distal to the area of stenosis.  The central and segmental pulmonary arteries are well opacified without evidence of pulmonary embolus.  The heart size is normal. No pericardial effusion.  Small to moderate esophageal hiatal hernia.  Fluid and air in the esophagus suggest dysmotility or reflux.  Thyroid gland is homogeneous.  No significant lymphadenopathy in the chest.  No pleural effusions.  Emphysematous changes throughout the lungs.  Atelectasis or infiltration in the lung bases bilaterally.  No pneumothorax. Airways appear patent.  Degenerative changes throughout  the thoracic spine.  No destructive bone lesions appreciated.   Review of the MIP images confirms the above findings.  IMPRESSION: Extensive calcification of the thoracic aorta and coronary arteries.  No evidence of aortic aneurysm or dissection.  Focal high-grade appearing stenosis of the proximal left subclavian artery.  Diffuse emphysematous changes in the lungs.  Atelectasis or infiltration in the lower lungs.  CTA ABDOMEN AND PELVIS  Findings:  There is an infrarenal abdominal aortic aneurysm measuring up to about 3.7 cm maximal AP diameter.  This appears stable since the previous study.  Focal moderate stenosis of the abdominal aorta below the renal artery origins and above the level of the aneurysm caused by calcification and mural thrombus. No evidence of aortic dissection.  There is extensive calcific and non calcific atherosclerotic change throughout the abdominal aorta.  The celiac axis is patent with apparent mild to moderate stenosis of the origin.  The superior mesenteric artery is  patent.  Single bilateral renal arteries appear patent with moderate atherosclerotic change demonstrated.  The inferior mesenteric artery is patent.  Atherosclerotic changes throughout the right common iliac artery result and moderate diffuse narrowing.  Focal atherosclerotic change with apparent ulcerating plaque at the origin of the left common iliac artery. Both common iliac arteries, external and internal iliac arteries, and common femoral arteries are patent.  Described changes appear stable since previous study. Renal nephrograms are symmetrical.  Cholelithiasis without inflammatory change.  The liver, spleen, pancreas, adrenal glands, kidneys, inferior vena cava, and retroperitoneal lymph nodes are unremarkable.  The stomach is not abnormally distended.  Small bowel are decompressed.  Stool filled colon without abnormal distension.  Prominent visceral adipose tissues.  No free air or free fluid in the abdomen.  Small umbilical hernia containing fat.  Pelvis:  The prostate gland is enlarged, measuring 5.4 x 5.9 cm. The bladder wall is not thickened.  No free or loculated pelvic fluid collections.  No significant pelvic lymphadenopathy.  The rectosigmoid colon is decompressed without inflammatory change. The appendix is not identified.  Mild degenerative changes in the lumbar spine.  No destructive bone lesions appreciated.   Review of the MIP images confirms the above findings.  IMPRESSION: Stable appearance of abdominal aortic aneurysm at 3.7 cm AP diameter.  Diffuse atherosclerotic change throughout the abdominal aorta and major abdominal and pelvic vessels.  Moderate stenosis of the infrarenal abdominal aorta.  Moderate stenosis of the right common iliac artery.  Ulcerated plaque formation in the left common iliac artery origin.  Cholelithiasis.  Fat containing umbilical hernia.   Original Report Authenticated By: Burman Nieves, M.D.    Microbiology: No results found for this or any previous visit  (from the past 240 hour(s)).   Labs: Basic Metabolic Panel:  Recent Labs Lab 11/20/12 1945  NA 140  K 4.4  CL 103  CO2 28  GLUCOSE 176*  BUN 13  CREATININE 0.91  CALCIUM 9.9   Liver Function Tests: No results found for this basename: AST, ALT, ALKPHOS, BILITOT, PROT, ALBUMIN,  in the last 168 hours  Recent Labs Lab 11/20/12 2059  LIPASE 41   No results found for this basename: AMMONIA,  in the last 168 hours CBC:  Recent Labs Lab 11/20/12 1945  WBC 10.2  HGB 15.0  HCT 42.0  MCV 87.1  PLT 236   Cardiac Enzymes:  Recent Labs Lab 11/20/12 2316 11/21/12 0405 11/21/12 0746 11/21/12 1418  TROPONINI <0.30 <0.30 <0.30 <0.30   BNP: BNP (last 3 results)  Recent Labs  11/20/12 1945  PROBNP 187.4   CBG: No results found for this basename: GLUCAP,  in the last 168 hours     Signed:  Kela Millin  [facility] 11/22/2012, 1:37 PM

## 2012-11-24 ENCOUNTER — Telehealth: Payer: Self-pay | Admitting: Cardiovascular Disease

## 2012-11-24 NOTE — Telephone Encounter (Signed)
New Prob  Pt was in the hospital and Efraim Kaufmann has a few questions regarding his release and some medications.

## 2012-11-24 NOTE — Telephone Encounter (Signed)
6 months is ok as long as symptoms clear up with med changes. If persistent problems he should call back and I'll see him back sooner.

## 2012-11-24 NOTE — Telephone Encounter (Signed)
Pt's granddaughter Efraim Kaufmann called regarding pt. being in the ER for chest pain last Wednesday and discharged to home this past Saturday. Melissa states that while in the hospital the doctors found out that pt's chest pain was related to GI. Pt was seen by Dr. Gala Romney and he recommended for pt to change medication from Pradaxa to [facility] and to take Prilosec twice a day instead of once a day. Pt was seen in  Dr. Excell Seltzer clinic on 10/24/12 Efraim Kaufmann would like to know if Dr. Excell Seltzer want to see pt sooner,that 6 months as planned on pt's last office visit, and if he agrees with the medication changes.

## 2012-11-25 MED ORDER — APIXABAN 5 MG PO TABS: 5.0000 mg | ORAL_TABLET | Freq: Two times a day (BID) | ORAL | Status: DC

## 2012-11-25 NOTE — Telephone Encounter (Signed)
I spoke with Fernando Moyer and made her aware of medication change. She will contact the office if Eliquis is to expensive.

## 2012-11-25 NOTE — Telephone Encounter (Signed)
I agree with change to Eliquis 5 mg BID. thx

## 2012-11-25 NOTE — Telephone Encounter (Signed)
I spoke with Melissa and made her aware of Dr Earmon Phoenix comments in regards to follow-up. She also made me aware that the pt's Pradaxa has not been changed to Eliquis at this time.  This was recommended by Dr Gala Romney but he wanted to make sure this was okay with Dr Excell Seltzer before making change. I will forward this message to Dr Excell Seltzer to review and make further medication adjustments.

## 2012-11-25 NOTE — Telephone Encounter (Signed)
Rx sent to pharmacy for Eliquis. Pradaxa discontinued.  If Eliquis cost to much then pt can go back to Pradaxa.  Left message on machine for Melissa to contact the office.

## 2012-12-04 ENCOUNTER — Telehealth: Payer: Self-pay | Admitting: General Practice

## 2012-12-04 NOTE — Telephone Encounter (Signed)
Pt daughter just wanted to notify you that pt had been seen in the ER on 8-21.

## 2012-12-04 NOTE — Telephone Encounter (Signed)
Please call the daughter, patient to keep appointment he has w/ me 12-26-12  Thank you.

## 2012-12-05 ENCOUNTER — Telehealth: Payer: Self-pay | Admitting: General Practice

## 2012-12-05 DIAGNOSIS — M25579 Pain in unspecified ankle and joints of unspecified foot: Secondary | ICD-10-CM

## 2012-12-05 MED ORDER — OXYCODONE HCL 5 MG PO TABS: 5.0000 mg | ORAL_TABLET | ORAL | Status: DC | PRN

## 2012-12-05 NOTE — Telephone Encounter (Signed)
RX for oxycodone printed and faxed to [facility]

## 2012-12-05 NOTE — Telephone Encounter (Signed)
Left a detailed message informing Melissa of the need for pt to keep appt with Paz.

## 2012-12-05 NOTE — Telephone Encounter (Signed)
Granddaughter picked up Rx.

## 2012-12-05 NOTE — Telephone Encounter (Signed)
Pt granddaughter requesting oxycodone refill  Last OV 08-08-2012 Med last filled 10-09-12 #60 with 0 refills   Low Risk  Due for screen 09-2012  Pt has an appt on 9/26.

## 2012-12-05 NOTE — Telephone Encounter (Signed)
Patient's daughter is calling back requesting to speak with Shanda Bumps.

## 2012-12-05 NOTE — Telephone Encounter (Signed)
Done #60, no RF

## 2012-12-05 NOTE — Telephone Encounter (Signed)
Spoke with Pt granddaughter and advised that our triage line kept cutting off her messages. The Rx for Oxycodone was sent to [facility] for approval.

## 2012-12-26 ENCOUNTER — Encounter: Payer: Self-pay | Admitting: Internal Medicine

## 2012-12-26 ENCOUNTER — Ambulatory Visit (INDEPENDENT_AMBULATORY_CARE_PROVIDER_SITE_OTHER): Payer: Medicare Other | Admitting: Internal Medicine

## 2012-12-26 VITALS — BP 123/69 | HR 85 | Temp 98.2°F | Wt 217.2 lb

## 2012-12-26 DIAGNOSIS — Z79899 Other long term (current) drug therapy: Secondary | ICD-10-CM | POA: Diagnosis not present

## 2012-12-26 DIAGNOSIS — R079 Chest pain, unspecified: Secondary | ICD-10-CM | POA: Diagnosis not present

## 2012-12-26 DIAGNOSIS — E785 Hyperlipidemia, unspecified: Secondary | ICD-10-CM

## 2012-12-26 LAB — CBC WITH DIFFERENTIAL/PLATELET
Basophils Absolute: 0 10*3/uL (ref 0.0–0.1)
Eosinophils Absolute: 0.3 10*3/uL (ref 0.0–0.7)
Lymphocytes Relative: 19.3 % (ref 12.0–46.0)
MCHC: 34.1 g/dL (ref 30.0–36.0)
Neutro Abs: 6.5 10*3/uL (ref 1.4–7.7)
Neutrophils Relative %: 70.3 % (ref 43.0–77.0)
RDW: 14.4 % (ref 11.5–14.6)

## 2012-12-26 LAB — HEPATIC FUNCTION PANEL
ALT: 13 U/L (ref 0–53)
Alkaline Phosphatase: 55 U/L (ref 39–117)
Bilirubin, Direct: 0.2 mg/dL (ref 0.0–0.3)
Total Protein: 6.9 g/dL (ref 6.0–8.3)

## 2012-12-26 NOTE — Assessment & Plan Note (Addendum)
79 year old gentleman with history of heart disease, atrial fibrillation, status post open heart surgery who presented with chest pain. Recent workup including CT of the chest that showed no aortic dissection or PE and a negative stress test. CT of the abdomen showed cholelithiasis.  Hospital team recommended GI eval if needed Chest wall is tender to palpation. Although Pain is likely  chest wall related, will proceed with the following plan: CBC, LFTs. GI referral , HIDA? EGD? Pain meds

## 2012-12-26 NOTE — Assessment & Plan Note (Signed)
Due for a cholesterol check, nonfasting, recommend to come back in 2 months fasting.

## 2012-12-26 NOTE — Patient Instructions (Signed)
If you have more frequent or intense chest pain : call or go to the ER Come back fasting in 2 months. Have labs drawn before you leave

## 2012-12-26 NOTE — Progress Notes (Signed)
Subjective:    Patient ID: Fernando Moyer, male    DOB: 06-14-1933, 79 y.o.   MRN: [id number]  HPI Routine visit Was admitted to the hospital 11/20/2012 with atypical chest pain, stress test was negative, symptoms felt to be GI related,PPIs dose increased   CT Chest and  abdomen:  Extensive calcification of the thoracic aorta and coronary  arteries. No evidence of aortic aneurysm or dissection. Focal  high-grade appearing stenosis of the proximal left subclavian  artery. Diffuse emphysematous changes in the lungs. Atelectasis  or infiltration in the lower lungs.  Stable appearance of abdominal aortic aneurysm at 3.7 cm AP  diameter. Diffuse atherosclerotic change throughout the abdominal  aorta and major abdominal and pelvic vessels. Moderate stenosis of  the infrarenal abdominal aorta. Moderate stenosis of the right  common iliac artery. Ulcerated plaque formation in the left common  iliac artery origin. Cholelithiasis. Fat containing umbilical  hernia.   Past Medical History  Diagnosis Date  . GERD with stricture     w/hx of stricture  . PVD (peripheral vascular disease) 12/07    per cath 12/07....iliac  . Carotid artery occlusion     last u/s 3-11...Marland Kitchenper vasc.surgery 2007 cath: nonobstructive CAD. mil;d MS, trivial AoS, small AAA,  . Bowen's disease     right arm BX: referred to dermatology  . Hyperlipidemia   . Emphysema     PFT 4/10 FVC 3.45 (90%), FEV1 2.72 (106%), TLC 5.36 (93%), DLCO 74%, NO BD RESPONSE: ? OF PSEUDONORMALIZATION OF PFT FINDINGS W/BOTH OBSTRUCTIVE AND RESTRICTIVE DEFECTS.  Marland Kitchen AAA (abdominal aortic aneurysm) 05/2008    PER CARDIAC CATH  . Aortic stenosis 05/2008    PER CARDIAC CATH, MILD, MITRAL STENOSIS  . Mitral stenosis 05/2008    PER CARDIAC CATH  . Coronary artery disease 05/2008    MILD, MEDICAL MANAGEMENT  . Pulmonary hypertension   . Hypertension 02/21/11    pt denies this hx  . Dyspnea      w/u included a cath and PFT's, sxms thought to be  pulmonary  . Chronic back pain   . Peripheral neuropathy     right thigh; "it's been that way for years"  . Diabetes mellitus 09/07/2011   Past Surgical History  Procedure Laterality Date  . Appendectomy    . Esophageal dilation    . Cardiac catheterization  02/2011    LAD 50, CFX OK, RCA40, EF 55%; PCWP 17, AoV  0.9 cm squared  . Endarterectomy  08-2010    Right carotid endarterectomy    . Aortic valve replacement  08/17/2011    Procedure: AORTIC VALVE REPLACEMENT (AVR);  Surgeon: Alleen Borne, MD;  Location: [facility] OR;  Service: Open Heart Surgery;  Laterality: N/A;   History   Social History  . Marital Status: Widowed    Spouse Name: N/A    Number of Children: 2  . Years of Education: N/A   Occupational History  . 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 Smoking status: Former Smoker -- 2.00 packs/day for 30 years    Types: Cigarettes    Quit date: 04/02/1986  . Smokeless tobacco: Never Used     Comment: quit smoking cigarettes 04/1987  . Alcohol Use: No  . Drug Use: No  . Sexual Activity: No   Other Topics Concern  . Not on file   Social History  Narrative   LIVES BY HIMSELF    HAS 2 GROWN SONS, 5 G-KIDS (Melissa usually comes with him for his appointments)            Review of Systems Pt states in general, not feeling well. "I am fatigue and tired since the surgery last year", denies depression. Since he left the hospital had ~ 2 episodes of CP, has a hard time describing it: burning? "a little", sharp? "sort off", "I just cant sit still w/ the pain". CP located @ the right side, not associated with sweats, nausea. Does feel slightly short of breath and weak w/ CP. Denies heartburn, belching. Chest pain is not related to food intake and may last an hour or 2 after he takes pain medication.     Objective:   Physical Exam BP 123/69  Pulse 85  Temp(Src) 98.2 F (36.8 C)  Wt  217 lb 3.2 oz (98.521 kg)  BMI 34.01 kg/m2  SpO2 96% General -- alert, well-developed, NAD.   Lungs -- normal respiratory effort, no intercostal retractions, no accessory muscle use, and slt decreased breath sounds.  Heart-- normal rate, regular rhythm, + syst  murmur.  Chest wall-- slt TTP at R para sternal area  Abdomen-- Not distended, good bowel sounds,soft, slt tender at epigastrium w/o mass or rebound  Extremities-- no pretibial edema bilaterally  Neurologic--  alert & oriented X3.  Speech normal, gait normal, strength normal in all extremities. DTRs symmetric  EOMI, PERLA   Psych-- Cognition and judgment appear intact. Cooperative with normal attention span and concentration. No anxious appearing , no depressed appearing.       Assessment & Plan:

## 2012-12-27 ENCOUNTER — Encounter: Payer: Self-pay | Admitting: Internal Medicine

## 2013-01-07 ENCOUNTER — Telehealth: Payer: Self-pay | Admitting: Internal Medicine

## 2013-01-07 NOTE — Telephone Encounter (Signed)
UDS from 12/26/2012: Negative for oxycodone, + alcohol---> high-risk.

## 2013-01-12 ENCOUNTER — Encounter: Payer: Self-pay | Admitting: Gastroenterology

## 2013-01-20 ENCOUNTER — Ambulatory Visit (INDEPENDENT_AMBULATORY_CARE_PROVIDER_SITE_OTHER): Payer: Medicare Other | Admitting: General Practice

## 2013-01-20 DIAGNOSIS — Z23 Encounter for immunization: Secondary | ICD-10-CM

## 2013-01-26 ENCOUNTER — Telehealth: Payer: Self-pay | Admitting: *Deleted

## 2013-01-26 NOTE — Telephone Encounter (Signed)
Advise patient:  The last time we checked his urine, he was high risk ( had no oxycodone in his system), I won't be able to refill Oxycodone.  If so desired, make an appointment and will discuss the results.

## 2013-01-26 NOTE — Telephone Encounter (Signed)
Patients daughter, Efraim Kaufmann, called requesting refill for oxycodone. She stated that the immediate release does not work as well as the regular.  Last OV: 12/26/2012 Last Refill: 12/05/2012 #60

## 2013-01-27 ENCOUNTER — Encounter: Payer: Self-pay | Admitting: Internal Medicine

## 2013-01-30 ENCOUNTER — Other Ambulatory Visit: Payer: Self-pay

## 2013-01-30 ENCOUNTER — Telehealth: Payer: Self-pay

## 2013-01-30 NOTE — Telephone Encounter (Signed)
Error.     KP 

## 2013-01-30 NOTE — Telephone Encounter (Signed)
Patient granddaughter has been made aware that we will no longer be filling the Rx and if she need to discuss we can make an apt. She stated she will check his pill boxes and get back to [place].     KP

## 2013-01-30 NOTE — Telephone Encounter (Signed)
Called and left voicemail to return call.

## 2013-02-10 ENCOUNTER — Encounter: Payer: Self-pay | Admitting: *Deleted

## 2013-02-11 ENCOUNTER — Telehealth: Payer: Self-pay | Admitting: *Deleted

## 2013-02-11 ENCOUNTER — Ambulatory Visit: Payer: Medicare Other | Admitting: Gastroenterology

## 2013-02-11 ENCOUNTER — Encounter: Payer: Self-pay | Admitting: Gastroenterology

## 2013-02-11 ENCOUNTER — Ambulatory Visit (INDEPENDENT_AMBULATORY_CARE_PROVIDER_SITE_OTHER): Payer: Medicare Other | Admitting: Gastroenterology

## 2013-02-11 VITALS — BP 138/60 | HR 60 | Ht 67.0 in | Wt 220.8 lb

## 2013-02-11 DIAGNOSIS — Z8719 Personal history of other diseases of the digestive system: Secondary | ICD-10-CM

## 2013-02-11 DIAGNOSIS — R079 Chest pain, unspecified: Secondary | ICD-10-CM

## 2013-02-11 DIAGNOSIS — R1319 Other dysphagia: Secondary | ICD-10-CM

## 2013-02-11 DIAGNOSIS — D689 Coagulation defect, unspecified: Secondary | ICD-10-CM | POA: Diagnosis not present

## 2013-02-11 NOTE — Patient Instructions (Signed)
You have been scheduled for an endoscopy with propofol. Please follow written instructions given to you at your visit today. If you use inhalers (even only as needed), please bring them with you on the day of your procedure. Your physician has requested that you go to www.startemmi.com and enter the access code given to you at your visit today. This web site gives a general overview about your procedure. However, you should still follow specific instructions given to you by our office regarding your preparation for the procedure.  Follow the directions on the Eliquis to stay off of it for the 02-20-2013 . If Dr. Excell Seltzer wants to change this we will let you know.

## 2013-02-11 NOTE — Telephone Encounter (Signed)
02/11/2013   RE: Fernando Moyer DOB: 1933/08/15 MRN: [id number]   Dear Dr. Tonny Bollman,    We have scheduled the above patient for an endoscopic procedure. Our records show that he is on anticoagulation therapy.    The patient advised [place] he is going to be off the Eliquis for his back injection scheduled for 02-17-2013.  Would it be acceptable to you for him to stay off the Eliquis for the Endoscopy with dilation scheduled for 02-20-2013?  Please fax back/ or route the completed form to [facility] CMA at [phone].   Sincerely,    Doug Sou PA-C    Lowry Ram CMA

## 2013-02-12 ENCOUNTER — Telehealth: Payer: Self-pay | Admitting: *Deleted

## 2013-02-12 ENCOUNTER — Other Ambulatory Visit: Payer: Self-pay | Admitting: *Deleted

## 2013-02-12 ENCOUNTER — Encounter: Payer: Self-pay | Admitting: Gastroenterology

## 2013-02-12 DIAGNOSIS — Z8719 Personal history of other diseases of the digestive system: Secondary | ICD-10-CM | POA: Insufficient documentation

## 2013-02-12 DIAGNOSIS — R1310 Dysphagia, unspecified: Secondary | ICD-10-CM | POA: Insufficient documentation

## 2013-02-12 NOTE — Telephone Encounter (Signed)
Spoke with Melissa who states it's OK to schedule the COLON with the EGD; explained there will be a different prep, but she states that's OK. Will mail the prep paper work to pt with consents to be signed and mailed or brought back to [place]. Melissa stated understanding.

## 2013-02-12 NOTE — Telephone Encounter (Signed)
Yes, please add colonoscopy.  Thank you,  Jess

## 2013-02-12 NOTE — Telephone Encounter (Signed)
No answer when I called pt so I left a message on Melissa's cell. Pt is overdue for a COLON by 9 years and if he has to come off his anticoagulant, it is a good time to do it now.

## 2013-02-12 NOTE — Progress Notes (Signed)
02/12/2013 Fernando Moyer [id number] 09/20/1933   HISTORY OF PRESENT ILLNESS:  This is a 79 year old male who is known to Dr. Jarold Motto for previous colonoscopy. He presents to our office today with complaints of chest pain and dysphagia. His main complaint is central chest pain that he says has been occurring since May 2013 after his open heart surgery for aortic valve replacement. He says that he thinks it may be related to the surgery, however, his PCP wanted him evaluated from our standpoint as well. He says that the chest pain comes and goes, is not constant. He says that he wakes up in the middle the night every night with some chest pain, but there are sometimes when it is very severe and he has gone to the emergency department for evaluation, which are always unrevealing. He does also note and some heartburn and indigestion in the middle of the night at times as well, which is relieved by Rolaids. He does notice that some solid food such as steak, etc get hung up in his esophagus when he is eating. He denies any nausea or vomiting. There is some mild epigastric discomfort associated with all of this as well. He does have a history of and esophageal stricture, which was dilated in November 2006 for Dr. Arlyce Dice. This was dilated with a savory dilator with diameters of 15-17-18. He is currently taking omeprazole 20 mg twice daily.  After the patient left from his visit I reviewed his previous colonoscopy from June of 2002. At that time he was noted to have colonic polyps removed, which were tubular adenomas. It was recommended he have a repeat colonoscopy in 3 years, but according to our records he has not had another one of those performed.  Just of note, he is on Eliquis but says that he is holding it next week for a back injection.    Past Medical History  Diagnosis Date  . GERD with stricture     w/hx of stricture  . PVD (peripheral vascular disease) 12/07    per cath 12/07....iliac  .  Carotid artery occlusion     last u/s 3-11...Marland Kitchenper vasc.surgery 2007 cath: nonobstructive CAD. mil;d MS, trivial AoS, small AAA,  . Bowen's disease     right arm BX: referred to dermatology  . Hyperlipidemia   . Emphysema     PFT 4/10 FVC 3.45 (90%), FEV1 2.72 (106%), TLC 5.36 (93%), DLCO 74%, NO BD RESPONSE: ? OF PSEUDONORMALIZATION OF PFT FINDINGS W/BOTH OBSTRUCTIVE AND RESTRICTIVE DEFECTS.  Marland Kitchen AAA (abdominal aortic aneurysm) 05/2008    PER CARDIAC CATH  . Aortic stenosis 05/2008    PER CARDIAC CATH, MILD, MITRAL STENOSIS  . Mitral stenosis 05/2008    PER CARDIAC CATH  . Coronary artery disease 05/2008    MILD, MEDICAL MANAGEMENT  . Pulmonary hypertension   . Hypertension 02/21/11    pt denies this hx  . Dyspnea      w/u included a cath and PFT's, sxms thought to be pulmonary  . Chronic back pain   . Peripheral neuropathy     right thigh; "it's been that way for years"  . Diabetes mellitus 09/07/2011  . Benign neoplasm of colon   . Stricture and stenosis of esophagus   . COPD (chronic obstructive pulmonary disease)    Past Surgical History  Procedure Laterality Date  . Appendectomy    . Esophageal dilation    . Cardiac catheterization  02/2011    LAD 50, CFX OK, RCA40, EF  55%; PCWP 17, AoV  0.9 cm squared  . Endarterectomy  08-2010    Right carotid endarterectomy    . Aortic valve replacement  08/17/2011    Procedure: AORTIC VALVE REPLACEMENT (AVR);  Surgeon: Alleen Borne, MD;  Location: [facility] OR;  Service: Open Heart Surgery;  Laterality: N/A;    reports that he quit smoking about 26 years ago. His smoking use included Cigarettes. He has a 60 pack-year smoking history. He has never used smokeless tobacco. He reports that he does not drink alcohol or use illicit drugs. family history includes Heart attack (age of onset: 41) in his father. There is no history of Diabetes, Colon cancer, Prostate cancer, Anesthesia problems, Hypotension, Malignant hyperthermia, or Pseudochol  deficiency. No Known Allergies    Outpatient Encounter Prescriptions as of 02/11/2013  Medication Sig  . apixaban (ELIQUIS) 5 MG TABS tablet Take 1 tablet (5 mg total) by mouth 2 (two) times daily.  . furosemide (LASIX) 40 MG tablet Take 1 tablet (40 mg total) by mouth daily.  . meclizine (ANTIVERT) 25 MG tablet Take 25 mg by mouth 3 (three) times daily as needed for dizziness.  Marland Kitchen omeprazole (PRILOSEC OTC) 20 MG tablet Take 2 tablets (40 mg total) by mouth 2 (two) times daily.  Marland Kitchen oxyCODONE (OXY IR/ROXICODONE) 5 MG immediate release tablet Take 1 tablet (5 mg total) by mouth every 4 (four) hours as needed for pain.  . potassium chloride SA (K-DUR,KLOR-CON) 20 MEQ tablet Take 1 tablet (20 mEq total) by mouth daily.  . pravastatin (PRAVACHOL) 40 MG tablet Take 40 mg by mouth every evening.   . [DISCONTINUED] docusate sodium 100 MG CAPS Take 100 mg by mouth 2 (two) times daily.     REVIEW OF SYSTEMS  : All other systems reviewed and negative except where noted in the History of Present Illness.   PHYSICAL EXAM: BP 138/60  Pulse 60  Ht 5\' 7"  (1.702 m)  Wt 220 lb 12.8 oz (100.154 kg)  BMI 34.57 kg/m2 General: Well developed white male in no acute distress Head: Normocephalic and atraumatic Eyes:  Sclerae anicteric, conjunctiva pink. Ears: Normal auditory acuity. Lungs: Clear throughout to auscultation Heart: Regular rate and rhythm; systolic click noted Abdomen: Soft, obese, non-distended.  Normal bowel sounds.  Mild epigastric TTP without R/R/G. Musculoskeletal: Symmetrical with no gross deformities  Skin: No lesions on visible extremities Extremities: +2 pitting edema in B/L LE's Neurological: Alert oriented x 4, grossly non-focal. Psychological:  Alert and cooperative. Normal mood and affect  ASSESSMENT AND PLAN: -Atypical chest pain and intermittent solid food dysphagia and history of esophageal stricture:  Unsure if this is GI related, secondary to reflux vs recurrent  stricture, etc or whether this is related to his heart surgery/valve replacement. He is on omeprazole 20 mg twice daily. He will continue that for now. We will schedule for EGD (with possible dilation) for further evaluation. He states that he is holding his Eliquis for a few days next week in order to get a back injection. We will try to schedule his EGD around the same time since he will already be holding his anticoagulation medication. We will confirm with Dr. Excell Seltzer in regards to holding this.  The risks, benefits, and alternatives were discussed with the patient and he consents to proceed.  The risks benefits and alternatives to a temporary hold of anti-coagulants/anti-platelets for the procedure were discussed with the patient he consents to proceed. Obtain clearance from Dr. Excell Seltzer. -Personal history of colon polyps:  After further review of his chart, I noted that he is overdue for colonoscopy by 9 years. This was realized after he left from his visit. I am going to have our office staff contact him and see if he is interested in scheduling colonoscopy at the same time as his EGD since we will already be stopping his Eliquis.

## 2013-02-16 ENCOUNTER — Telehealth: Payer: Self-pay | Admitting: Gastroenterology

## 2013-02-16 MED ORDER — PEG-KCL-NACL-NASULF-NA ASC-C 100 G PO SOLR: 1.0000 | Freq: Once | ORAL | Status: DC

## 2013-02-16 NOTE — Telephone Encounter (Signed)
lmom for Fernando Moyer that I ordered the Movi Prep; she may call back for questions.

## 2013-02-17 ENCOUNTER — Telehealth: Payer: Self-pay | Admitting: Cardiovascular Disease

## 2013-02-17 ENCOUNTER — Telehealth: Payer: Self-pay | Admitting: *Deleted

## 2013-02-17 NOTE — Telephone Encounter (Signed)
New message      Having colonoscopy on fri---is it ok to stay off eliquis until after procedure?

## 2013-02-17 NOTE — Telephone Encounter (Signed)
This is ok with me. He should resume after his GI procedure is completed. thx

## 2013-02-17 NOTE — Telephone Encounter (Signed)
I called and spoke to someone at [facility]. She tried to get one of the nurses working at the [employer] Dr. Excell Seltzer is seeing patients at.  They were all in rooms.  So she will have one of them call me.  We just need to know if it is ok for the patient to stay off the Eliquis until after his procedures on 11-21.  He is off of it due to having a back injection today 11-18.  Dr. Excell Seltzer approved him to be off of the medication for the back injection.

## 2013-02-17 NOTE — Telephone Encounter (Signed)
Derry Skill, CMA at 02/17/2013 4:47 PM    Status: Signed        I called and spoke to someone at [facility]. She tried to get one of the nurses working at the [employer] Dr. Excell Seltzer is seeing patients at. They were all in rooms. So she will have one of them call me. We just need to know if it is ok for the patient to stay off the Eliquis until after his procedures on 11-21. He is off of it due to having a back injection today 11-18. Dr. Excell Seltzer approved him to be off of the medication for the back injection.   I will forward this message to Dr Excell Seltzer to review.

## 2013-02-18 ENCOUNTER — Telehealth: Payer: Self-pay | Admitting: *Deleted

## 2013-02-18 DIAGNOSIS — M47817 Spondylosis without myelopathy or radiculopathy, lumbosacral region: Secondary | ICD-10-CM | POA: Diagnosis not present

## 2013-02-18 NOTE — Telephone Encounter (Signed)
I will forward this message to [facility] in Epic.  I spoke with the GI office and Pam is working today.

## 2013-02-18 NOTE — Telephone Encounter (Signed)
I spoke to the patient and he is aware he can stay off his Eilquis until after his procedures, EGD/COLON on Friday 02-20-2013. He has been off of the medication due to having back injections on Tuesday 02-17-2013. Dr. Tonny Bollman at [facility] approved this.

## 2013-02-18 NOTE — Telephone Encounter (Signed)
Dr. Excell Seltzer said the patient can stay off his Eliquis through the date of his procedure on 02-20-2013. He should resume it after his GI procedure is completed, per Dr. Tonny Bollman.

## 2013-02-20 ENCOUNTER — Encounter: Payer: Self-pay | Admitting: Gastroenterology

## 2013-02-20 ENCOUNTER — Ambulatory Visit (AMBULATORY_SURGERY_CENTER): Payer: Medicare Other | Admitting: Gastroenterology

## 2013-02-20 VITALS — BP 148/62 | HR 86 | Temp 97.3°F | Resp 23 | Ht 67.0 in | Wt 220.0 lb

## 2013-02-20 DIAGNOSIS — K222 Esophageal obstruction: Secondary | ICD-10-CM | POA: Diagnosis not present

## 2013-02-20 DIAGNOSIS — D128 Benign neoplasm of rectum: Secondary | ICD-10-CM

## 2013-02-20 DIAGNOSIS — I359 Nonrheumatic aortic valve disorder, unspecified: Secondary | ICD-10-CM | POA: Diagnosis not present

## 2013-02-20 DIAGNOSIS — I2510 Atherosclerotic heart disease of native coronary artery without angina pectoris: Secondary | ICD-10-CM | POA: Diagnosis not present

## 2013-02-20 DIAGNOSIS — K219 Gastro-esophageal reflux disease without esophagitis: Secondary | ICD-10-CM | POA: Diagnosis not present

## 2013-02-20 DIAGNOSIS — I10 Essential (primary) hypertension: Secondary | ICD-10-CM | POA: Diagnosis not present

## 2013-02-20 DIAGNOSIS — Z8719 Personal history of other diseases of the digestive system: Secondary | ICD-10-CM

## 2013-02-20 DIAGNOSIS — R079 Chest pain, unspecified: Secondary | ICD-10-CM | POA: Diagnosis not present

## 2013-02-20 DIAGNOSIS — Z86010 Personal history of colonic polyps: Secondary | ICD-10-CM | POA: Diagnosis not present

## 2013-02-20 DIAGNOSIS — D126 Benign neoplasm of colon, unspecified: Secondary | ICD-10-CM | POA: Diagnosis not present

## 2013-02-20 DIAGNOSIS — E119 Type 2 diabetes mellitus without complications: Secondary | ICD-10-CM | POA: Diagnosis not present

## 2013-02-20 DIAGNOSIS — R0789 Other chest pain: Secondary | ICD-10-CM

## 2013-02-20 DIAGNOSIS — R1319 Other dysphagia: Secondary | ICD-10-CM | POA: Diagnosis not present

## 2013-02-20 DIAGNOSIS — R109 Unspecified abdominal pain: Secondary | ICD-10-CM | POA: Diagnosis not present

## 2013-02-20 MED ORDER — SODIUM CHLORIDE 0.9 % IV SOLN: 500.0000 mL | INTRAVENOUS | Status: DC

## 2013-02-20 MED ORDER — FLEET ENEMA 7-19 GM/118ML RE ENEM
1.0000 | ENEMA | Freq: Once | RECTAL | Status: AC
  Administered 2013-02-20: 1 via RECTAL

## 2013-02-20 NOTE — Progress Notes (Signed)
Pt. Had rectal tube inserted with moderate amount of air expelled.  He stated that he  "felt better".  Small amount of blood returned as tube removed. Rechecked rectal area prior to pt. Dressing.  No bleeding noted.  Pt. Abdomen remained somewhat distended.  Dr. Jarold Motto aware and pt. Ready  for discharge.FOLLOW DISCHARGE INSTRUCTIONS (BLUE AND GREEN SHEETS).Patient did not experience any of the following events: a burn prior to discharge; a fall within the facility; wrong site/side/patient/procedure/implant event; or a hospital transfer or hospital admission upon discharge from the facility. (G8907)Patient did not have preoperative order for IV antibiotic SSI prophylaxis. [phone])

## 2013-02-20 NOTE — Op Note (Signed)
[facility] [address] [place], [address]   COLONOSCOPY PROCEDURE REPORT  PATIENT: Fernando Moyer, Fernando Moyer  MR#: [id number] BIRTHDATE: 03-30-1934 , 79  yrs. old GENDER: Male ENDOSCOPIST: Mardella Layman, MD, [facility] REFERRED BY: PROCEDURE DATE:  02/20/2013 PROCEDURE:   Colonoscopy with snare polypectomy First Screening Colonoscopy - Avg.  risk and is 50 yrs.  old or older - No.  Prior Negative Screening - Now for repeat screening. N/A  History of Adenoma - Now for follow-up colonoscopy & has been > or = to 3 yrs.  N/A History of Adenoma - Now for follow-up colonoscopy & has been > or = to 3 yrs.  Yes hx of adenoma.  Has been 3 or more years since last colonoscopy.  Polyps Removed Today? Yes. ASA CLASS:   Class III INDICATIONS: MEDICATIONS: Propofol (Diprivan) 230 mg IV  DESCRIPTION OF PROCEDURE:   After the risks benefits and alternatives of the procedure were thoroughly explained, informed consent was obtained.  A digital rectal exam revealed no abnormalities of the rectum.   The LB [id number]  endoscope was introduced through the anus and advanced to the cecum, which was identified by both the appendix and ileocecal valve. No adverse events experienced.   Limited by a tortuous and redundant colon. The quality of the prep was good, using MoviPrep  The instrument was then slowly withdrawn as the colon was fully examined.      COLON FINDINGS: A smooth sessile polyp measuring 2.2 cm in size with a mucous cap was found in the rectum.  A polypectomy was performed using snare cautery. The polyp is very soft and with mucus. The resection was complete and the polyp tissue was completely retrieved.  Retroflexed views revealed no abnormalities. The time to cecum=5 minutes 02 seconds.  Withdrawal time=8 minutes 32 seconds.  The scope was withdrawn and the procedure completed. COMPLICATIONS: There were no complications.  ENDOSCOPIC IMPRESSION: Sessile polyp  measuring 2.2 cm in size was found in the rectum; polypectomy was performed using snare cautery ..many small hyperplastic rectal 1-30mm nodules noted in surrounding area not removed....probable mucoid hyperplastic polyp.  RECOMMENDATIONS: 1.  Await pathology results 2.  Repeat colonoscopy in 3 years if polyp adenomatous; otherwise 10 years   eSigned:  Mardella Layman, MD, [facility] 02/20/2013 2:15 PM   cc:   PATIENT NAME:  Fernando Moyer, Fernando Moyer MR#: [id number]

## 2013-02-20 NOTE — Patient Instructions (Addendum)

## 2013-02-20 NOTE — Progress Notes (Signed)
Report to pacu rn, vss, bbs=clear 

## 2013-02-20 NOTE — Progress Notes (Signed)
Pt reported cloudy brown stool, enema was ordered, results were yellow liquid with stool particles, advised Dr. Jarold Motto, ok to proceed with procedure-adm

## 2013-02-20 NOTE — Op Note (Signed)
[facility] [address] [place], [address]   ENDOSCOPY PROCEDURE REPORT  PATIENT: Fernando Moyer, Fernando Moyer  MR#: [id number] BIRTHDATE: [birth year] , 79  yrs. old GENDER: Male ENDOSCOPIST:Burton Gahan Hale Bogus, MD, [facility] REFERRED BY: PROCEDURE DATE:  02/20/2013 PROCEDURE:   EGD, diagnostic and Maloney dilation of esophagus ASA CLASS:    Class III INDICATIONS: Dysphagia. MEDICATION: There was residual sedation effect present from prior procedure and Propofol (Diprivan) 70 TOPICAL ANESTHETIC:   Cetacaine Spray  DESCRIPTION OF PROCEDURE:   After the risks and benefits of the procedure were explained, informed consent was obtained.  The LB [id number]  endoscope was introduced through the mouth  and advanced to the second portion of the duodenum .  The instrument was slowly withdrawn as the mucosa was fully examined.      DUODENUM: The duodenal mucosa showed no abnormalities in the bulb and second portion of the duodenum.  STOMACH: The mucosa of the stomach appeared normal.  ESOPHAGUS: A large hiatal hernia was noted.   The mucosa of the esophagus appeared normal.    Retroflexed views revealed a large hiatal hernia. Dilated #64F Maloney dilator,slight resistance noted....   The scope was then withdrawn from the patient and the procedure completed.  COMPLICATIONS: There were no complications.   ENDOSCOPIC IMPRESSION: 1.   The duodenal mucosa showed no abnormalities in the bulb and second portion of the duodenum 2.   The mucosa of the stomach appeared normal 3.   Large hiatal hernia and treated chronic GERD and stricture dilated 4.   The mucosa of the esophagus appeared normal  RECOMMENDATIONS: 1.  Continue current meds 2.  Dilatations PRN    _______________________________ eSigned:  Mardella Layman, MD, [facility] 02/20/2013 2:21 PM

## 2013-02-20 NOTE — Progress Notes (Addendum)
Called to room to assist during endoscopic procedure.  Patient ID and intended procedure confirmed with present staff. Received instructions for my participation in the procedure from the performing physician. ewm  Called back into room after egd completed. Informed by c.sigmon tech pt dilated. See notes. ewm

## 2013-02-23 ENCOUNTER — Telehealth: Payer: Self-pay | Admitting: *Deleted

## 2013-02-23 NOTE — Telephone Encounter (Signed)
  Follow up Call-  Call back number 02/20/2013  Post procedure Call Back phone  # [phone]  Permission to leave phone message Yes     Patient questions:  Do you have a fever, pain , or abdominal swelling? no Pain Score  0 *  Have you tolerated food without any problems? yes  Have you been able to return to your normal activities? yes  Do you have any questions about your discharge instructions: Diet   no Medications  no Follow up visit  no  Do you have questions or concerns about your Care? no  Actions: * If pain score is 4 or above: No action needed, pain <4.

## 2013-02-24 ENCOUNTER — Encounter: Payer: Self-pay | Admitting: Gastroenterology

## 2013-02-25 ENCOUNTER — Ambulatory Visit (INDEPENDENT_AMBULATORY_CARE_PROVIDER_SITE_OTHER): Payer: Medicare Other | Admitting: Internal Medicine

## 2013-02-25 ENCOUNTER — Encounter: Payer: Self-pay | Admitting: Internal Medicine

## 2013-02-25 VITALS — BP 130/60 | HR 80 | Temp 97.8°F | Wt 216.0 lb

## 2013-02-25 DIAGNOSIS — R079 Chest pain, unspecified: Secondary | ICD-10-CM | POA: Diagnosis not present

## 2013-02-25 DIAGNOSIS — K219 Gastro-esophageal reflux disease without esophagitis: Secondary | ICD-10-CM

## 2013-02-25 DIAGNOSIS — E119 Type 2 diabetes mellitus without complications: Secondary | ICD-10-CM | POA: Diagnosis not present

## 2013-02-25 DIAGNOSIS — Z0000 Encounter for general adult medical examination without abnormal findings: Secondary | ICD-10-CM

## 2013-02-25 DIAGNOSIS — E785 Hyperlipidemia, unspecified: Secondary | ICD-10-CM

## 2013-02-25 NOTE — Assessment & Plan Note (Signed)
Asked patient to come back in 3 or 4 months for a physical. Had a colonoscopy a few days ago, see report. TSH is trending up, will need to be rechecked when he comes back to the office

## 2013-02-25 NOTE — Progress Notes (Signed)
Pre visit review using our clinic review tool, if applicable. No additional management support is needed unless otherwise documented below in the visit note. 

## 2013-02-25 NOTE — Patient Instructions (Addendum)
Please come back at your earliest convenience for labs, fasting: FLP --- dx hyperlipidemia A1c--- dx  Diabetes   Next visit in 3-4 months  for a physical exam . Fasting Please make an appointment    Chest pain, take Tylenol  500 mg OTC 2 tabs a day every 8 hours as needed for pain  Also put a warm compress Call if pain get worse

## 2013-02-25 NOTE — Assessment & Plan Note (Signed)
Good medication compliance, check a FLP, recent LFTs normal

## 2013-02-25 NOTE — Assessment & Plan Note (Addendum)
Recently seen by GI, EGD is 10/20/2012 showed a stricture (was stretched) and a HH

## 2013-02-25 NOTE — Assessment & Plan Note (Signed)
On diet control, check an A1c

## 2013-02-25 NOTE — Progress Notes (Signed)
  Subjective:    Patient ID: Fernando Moyer, male    DOB: 1933-12-13, 79 y.o.   MRN: [id number]  HPI Followup visit, here with Melissa. Chest pain, status post GI eval, had a EGD and a colonoscopy recently, see assessment and plan. Chest pain continue but is less noticeable. On further questioning, the pain is almost steady, increased with cough or sneezing, does not change by eating. Denies nausea or vomiting No heartburn per se, occasionally has some difficulty swallowing.  Past Medical History  Diagnosis Date  . GERD with stricture     w/hx of stricture  . PVD (peripheral vascular disease) 12/07    per cath 12/07....iliac  . Carotid artery occlusion     last u/s 3-11...Marland Kitchenper vasc.surgery 2007 cath: nonobstructive CAD. mil;d MS, trivial AoS, small AAA,  . Bowen's disease     right arm BX: referred to dermatology  . Hyperlipidemia   . Emphysema     PFT 4/10 FVC 3.45 (90%), FEV1 2.72 (106%), TLC 5.36 (93%), DLCO 74%, NO BD RESPONSE: ? OF PSEUDONORMALIZATION OF PFT FINDINGS W/BOTH OBSTRUCTIVE AND RESTRICTIVE DEFECTS.  Marland Kitchen AAA (abdominal aortic aneurysm) 05/2008    PER CARDIAC CATH  . Aortic stenosis 05/2008    PER CARDIAC CATH, MILD, MITRAL STENOSIS  . Mitral stenosis 05/2008    PER CARDIAC CATH  . Coronary artery disease 05/2008    MILD, MEDICAL MANAGEMENT  . Pulmonary hypertension   . Hypertension 02/21/11    pt denies this hx  . Dyspnea      w/u included a cath and PFT's, sxms thought to be pulmonary  . Chronic back pain   . Peripheral neuropathy     right thigh; "it's been that way for years"  . Diabetes mellitus 09/07/2011  . Benign neoplasm of colon   . Stricture and stenosis of esophagus   . COPD (chronic obstructive pulmonary disease)    Past Surgical History  Procedure Laterality Date  . Appendectomy    . Esophageal dilation    . Cardiac catheterization  02/2011    LAD 50, CFX OK, RCA40, EF 55%; PCWP 17, AoV  0.9 cm squared  . Endarterectomy  08-2010    Right carotid  endarterectomy    . Aortic valve replacement  08/17/2011    Procedure: AORTIC VALVE REPLACEMENT (AVR);  Surgeon: Alleen Borne, MD;  Location: [facility] OR;  Service: Open Heart Surgery;  Laterality: N/A;     Review of Systems See HPI    Objective:   Physical Exam BP 130/60  Pulse 80  Temp(Src) 97.8 F (36.6 C)  Wt 216 lb (97.977 kg)  SpO2 98% General -- alert, well-developed, NAD.   Lungs -- normal respiratory effort, no intercostal retractions, no accessory muscle use, and normal breath sounds.  Heart-- normal rate, regular rhythm, + syst murmur. Slightly tender to palpation at the anterior chest wall right from the sternum  Neurologic--  alert & oriented X3.   Psych-- Cognition and judgment appear intact. Cooperative with normal attention span and concentration. No anxious appearing , no depressed appearing.       Assessment & Plan:

## 2013-02-25 NOTE — Assessment & Plan Note (Signed)
Is becoming increasingly clear that this is a chest wall pain, patient was recently eval by GI and had a EGD. Plan: Tylenol, warm compress, no further eval indicated at this time

## 2013-02-27 ENCOUNTER — Other Ambulatory Visit (INDEPENDENT_AMBULATORY_CARE_PROVIDER_SITE_OTHER): Payer: Medicare Other

## 2013-02-27 DIAGNOSIS — E119 Type 2 diabetes mellitus without complications: Secondary | ICD-10-CM | POA: Diagnosis not present

## 2013-02-27 DIAGNOSIS — E785 Hyperlipidemia, unspecified: Secondary | ICD-10-CM

## 2013-02-27 LAB — LIPID PANEL
Cholesterol: 174 mg/dL (ref 0–200)
HDL: 38.9 mg/dL — ABNORMAL LOW (ref >39.00)
VLDL: 29.6 mg/dL (ref 0.0–40.0)

## 2013-02-27 LAB — HEMOGLOBIN A1C: Hgb A1c MFr Bld: 6.9 % — ABNORMAL HIGH (ref 4.6–6.5)

## 2013-03-09 ENCOUNTER — Other Ambulatory Visit: Payer: Self-pay | Admitting: Internal Medicine

## 2013-03-10 DIAGNOSIS — M47817 Spondylosis without myelopathy or radiculopathy, lumbosacral region: Secondary | ICD-10-CM | POA: Diagnosis not present

## 2013-03-10 NOTE — Telephone Encounter (Signed)
rx refilled per protocol. DJR  

## 2013-03-18 DIAGNOSIS — M545 Low back pain: Secondary | ICD-10-CM | POA: Diagnosis not present

## 2013-03-26 ENCOUNTER — Other Ambulatory Visit: Payer: Self-pay | Admitting: Cardiovascular Disease

## 2013-04-07 ENCOUNTER — Encounter: Payer: Self-pay | Admitting: Vascular Surgery

## 2013-04-08 ENCOUNTER — Ambulatory Visit (HOSPITAL_COMMUNITY)
Admission: RE | Admit: 2013-04-08 | Discharge: 2013-04-08 | Disposition: A | Discharge status: Home / Self Care | Payer: Medicare Other | Source: Ambulatory Visit | Attending: Vascular Surgery | Admitting: Vascular Surgery

## 2013-04-08 ENCOUNTER — Ambulatory Visit (INDEPENDENT_AMBULATORY_CARE_PROVIDER_SITE_OTHER): Payer: Medicare Other | Admitting: Vascular Surgery

## 2013-04-08 ENCOUNTER — Encounter: Payer: Self-pay | Admitting: Vascular Surgery

## 2013-04-08 DIAGNOSIS — I771 Stricture of artery: Secondary | ICD-10-CM | POA: Diagnosis not present

## 2013-04-08 DIAGNOSIS — I6529 Occlusion and stenosis of unspecified carotid artery: Secondary | ICD-10-CM | POA: Diagnosis not present

## 2013-04-08 DIAGNOSIS — I714 Abdominal aortic aneurysm, without rupture, unspecified: Secondary | ICD-10-CM | POA: Diagnosis not present

## 2013-04-08 DIAGNOSIS — Z48812 Encounter for surgical aftercare following surgery on the circulatory system: Secondary | ICD-10-CM | POA: Insufficient documentation

## 2013-04-08 NOTE — Addendum Note (Signed)
Addended by: Mena Goes on: 04/08/2013 05:29 PM   Modules accepted: Orders

## 2013-04-08 NOTE — Progress Notes (Signed)
[facility] [place]  Patient name: Fernando Moyer MRN: [id number] DOB: June 05, 1933 Sex: male  REASON FOR VISIT: Follow up of carotid disease  HPI: Fernando Moyer is a 80 y.o. male who underwent a right carotid endarterectomy in 2012. I have been following a moderate left carotid stenosis. Since I saw him last he denies any history of stroke or TIAs. He did have some blurry vision in his left eye but it does not sound like amaurosis fugax. He denies expressive or receptive aphasia.  He does occasionally have problems with dizziness but states that he has not had any dizziness in the last month.  He does have a history of an abdominal aortic aneurysm, and on a CT scan  On 11/20/2012, this measured 3.7 cm in maximum diameter. He denies any abdominal pain or back pain.  Past Medical History  Diagnosis Date  . GERD with stricture     w/hx of stricture  . PVD (peripheral vascular disease) 12/07    per cath 12/07....iliac  . Carotid artery occlusion     last u/s 3-11...Marland Kitchenper vasc.surgery 2007 cath: nonobstructive CAD. mil;d MS, trivial AoS, small AAA,  . Bowen's disease     right arm BX: referred to dermatology  . Hyperlipidemia   . Emphysema     PFT 4/10 FVC 3.45 (90%), FEV1 2.72 (106%), TLC 5.36 (93%), DLCO 74%, NO BD RESPONSE: ? OF PSEUDONORMALIZATION OF PFT FINDINGS W/BOTH OBSTRUCTIVE AND RESTRICTIVE DEFECTS.  Marland Kitchen AAA (abdominal aortic aneurysm) 05/2008    PER CARDIAC CATH  . Aortic stenosis 05/2008    PER CARDIAC CATH, MILD, MITRAL STENOSIS  . Mitral stenosis 05/2008    PER CARDIAC CATH  . Coronary artery disease 05/2008    MILD, MEDICAL MANAGEMENT  . Pulmonary hypertension   . Hypertension 02/21/11    pt denies this hx  . Dyspnea      w/u included a cath and PFT's, sxms thought to be pulmonary  . Chronic back pain   . Peripheral neuropathy     right thigh; "it's been that way for years"  . Diabetes mellitus 09/07/2011  . Benign neoplasm of colon   .  Stricture and stenosis of esophagus   . COPD (chronic obstructive pulmonary disease)    Family History  Problem Relation Age of Onset  . Heart attack Father 7    MI  . Diabetes Neg Hx   . Colon cancer Neg Hx   . Prostate cancer Neg Hx   . Anesthesia problems Neg Hx   . Hypotension Neg Hx   . Malignant hyperthermia Neg Hx   . Pseudochol deficiency Neg Hx   . Stomach cancer Neg Hx    SOCIAL HISTORY: History  Substance Use Topics  . Smoking status: Former Smoker -- 2.00 packs/day for 30 years    Types: Cigarettes    Quit date: 04/02/1986  . Smokeless tobacco: Never Used     Comment: quit smoking cigarettes 04/1987  . Alcohol Use: No   No Known Allergies Current Outpatient Prescriptions  Medication Sig Dispense Refill  . apixaban (ELIQUIS) 5 MG TABS tablet Take 1 tablet (5 mg total) by mouth 2 (two) times daily.  60 tablet  6  . furosemide (LASIX) 40 MG tablet TAKE 1 TABLET DAILY  30 tablet  0  . meclizine (ANTIVERT) 25 MG tablet Take 25 mg by mouth 3 (three) times daily as needed for dizziness.      Marland Kitchen omeprazole (PRILOSEC OTC) 20  MG tablet Take 2 tablets (40 mg total) by mouth 2 (two) times daily.  120 tablet  0  . oxyCODONE (OXY IR/ROXICODONE) 5 MG immediate release tablet Take 1 tablet (5 mg total) by mouth every 4 (four) hours as needed for pain.  60 tablet  0  . potassium chloride SA (K-DUR,KLOR-CON) 20 MEQ tablet TAKE 1 TABLET DAILY  30 tablet  0  . pravastatin (PRAVACHOL) 40 MG tablet Take 40 mg by mouth every evening.       . pravastatin (PRAVACHOL) 40 MG tablet TAKE 1 TABLET DAILY  30 tablet  2   No current facility-administered medications for this visit.   REVIEW OF SYSTEMS: Valu.Nieves ] denotes positive finding; [  ] denotes negative finding  CARDIOVASCULAR:  [ ]  chest pain   [ ]  chest pressure   [ ]  palpitations   [ ]  orthopnea   [ ]  dyspnea on exertion   [ ]  claudication   [ ]  rest pain   [ ]  DVT   [ ]  phlebitis PULMONARY:   [ ]  productive cough   [ ]  asthma   [ ]   wheezing NEUROLOGIC:   [ ]  weakness  [ ]  paresthesias  [ ]  aphasia  [ ]  amaurosis  [ ]  dizziness HEMATOLOGIC:   [ ]  bleeding problems   [ ]  clotting disorders MUSCULOSKELETAL:  [ ]  joint pain   [ ]  joint swelling [ ]  leg swelling GASTROINTESTINAL: [ ]   blood in stool  [ ]   hematemesis GENITOURINARY:  [ ]   dysuria  [ ]   hematuria PSYCHIATRIC:  [ ]  history of major depression INTEGUMENTARY:  [ ]  rashes  [ ]  ulcers CONSTITUTIONAL:  [ ]  fever   [ ]  chills  PHYSICAL EXAM: Filed Vitals:   04/08/13 1425 04/08/13 1434  BP: 158/80 111/64  Pulse: 87   Height: 5\' 7"  (1.702 m)   Weight: 221 lb (100.245 kg)   SpO2: 97%    Body mass index is 34.61 kg/(m^2). GENERAL: The patient is a well-nourished male, in no acute distress. The vital signs are documented above. CARDIOVASCULAR: There is a regular rate and rhythm. I do not detect carotid bruits. He has diminished femoral pulses bilaterally. Both feet appear adequately perfused. PULMONARY: There is good air exchange bilaterally without wheezing or rales. ABDOMEN: Soft and non-tender with normal pitched bowel sounds. Because of his size, I am unable to palpate his aneurysm. MUSCULOSKELETAL: There are no major deformities or cyanosis. NEUROLOGIC: No focal weakness or paresthesias are detected. SKIN: There are no ulcers or rashes noted. PSYCHIATRIC: The patient has a normal affect.  DATA:  I have independently interpreted his carotid duplex scan which shows that his right carotid endarterectomy site is widely patent. He has a 40-59% left carotid stenosis. He has a systolic pressure in the right brachial artery of 140 mmHg with a systolic pressure on the left of 100 mm mercury. There is retrograde flow the left vertebral artery.   I have reviewed his CT scan which is a 3.7 cm infrarenal abdominal aortic aneurysm and diffuse atherosclerotic disease of the infrarenal aorta and iliac arteries.  MEDICAL ISSUES:  ABDOMINAL AORTIC ANEURYSM The patient has  a 3.7 cm infrarenal abdominal aortic aneurysm based on a CT scan which was done in August of 2014. The patient tells me that this is followed by Dr. [name]. I've explained to the patient that we would not consider elective repair of the aneurysm unless it reached 5.5 cm in maximum  diameter and that this simply needs to be followed for now.  CAROTID ARTERY DISEASE His right carotid endarterectomy site is widely patent. He has a 40-59% left carotid stenosis. This is asymptomatic. Fortunately he is not a smoker. I were to follow duplex scan in 1 year now see him back at that time. He knows to call sooner if he has problems. Of note he is on a statin. He is also on Eliquis.   Subclavian artery stenosis This patient does have evidence of a subclavian artery stenosis on the left with reverse flow his left vertebral artery. The stenosis is also seen on a CT of the chest which was done in August of 2014. However, this is essentially asymptomatic. If he develops significant problems with dizziness then he should be considered for arteriography and possible stenting of the left subclavian artery stenosis.   [place] [facility] [place] Beeper: [phone]

## 2013-04-08 NOTE — Assessment & Plan Note (Signed)
His right carotid endarterectomy site is widely patent. He has a 40-59% left carotid stenosis. This is asymptomatic. Fortunately he is not a smoker. I were to follow duplex scan in 1 year now see him back at that time. He knows to call sooner if he has problems. Of note he is on a statin. He is also on Eliquis.

## 2013-04-08 NOTE — Assessment & Plan Note (Signed)
This patient does have evidence of a subclavian artery stenosis on the left with reverse flow his left vertebral artery. The stenosis is also seen on a CT of the chest which was done in August of 2014. However, this is essentially asymptomatic. If he develops significant problems with dizziness then he should be considered for arteriography and possible stenting of the left subclavian artery stenosis.

## 2013-04-08 NOTE — Assessment & Plan Note (Signed)
The patient has a 3.7 cm infrarenal abdominal aortic aneurysm based on a CT scan which was done in August of 2014. The patient tells me that this is followed by Dr. [name]. I've explained to the patient that we would not consider elective repair of the aneurysm unless it reached 5.5 cm in maximum diameter and that this simply needs to be followed for now.

## 2013-04-13 ENCOUNTER — Ambulatory Visit: Payer: Medicare Other | Admitting: Cardiovascular Disease

## 2013-04-15 ENCOUNTER — Encounter: Payer: Self-pay | Admitting: Cardiovascular Disease

## 2013-04-15 ENCOUNTER — Ambulatory Visit (INDEPENDENT_AMBULATORY_CARE_PROVIDER_SITE_OTHER): Payer: Medicare Other | Admitting: Cardiovascular Disease

## 2013-04-15 VITALS — BP 122/66 | HR 87 | Ht 67.0 in | Wt 219.0 lb

## 2013-04-15 DIAGNOSIS — I359 Nonrheumatic aortic valve disorder, unspecified: Secondary | ICD-10-CM | POA: Diagnosis not present

## 2013-04-15 DIAGNOSIS — R079 Chest pain, unspecified: Secondary | ICD-10-CM | POA: Diagnosis not present

## 2013-04-15 DIAGNOSIS — I2510 Atherosclerotic heart disease of native coronary artery without angina pectoris: Secondary | ICD-10-CM | POA: Diagnosis not present

## 2013-04-15 DIAGNOSIS — R0789 Other chest pain: Secondary | ICD-10-CM

## 2013-04-15 NOTE — Patient Instructions (Signed)
Your physician recommends that you continue on your current medications as directed. Please refer to the Current Medication list given to you today.  Your physician wants you to follow-up in: 6 months with Dr. Cooper.  You will receive a reminder letter in the mail two months in advance. If you don't receive a letter, please call our office to schedule the follow-up appointment.  

## 2013-04-15 NOTE — Progress Notes (Signed)
HPI:   79 year old gentleman presenting for followup evaluation. The patient has undergone bioprosthetic aortic valve replacement in 2013. He's also followed for hypertension and paroxysmal atrial fibrillation and is maintained on chronic anticoagulation with pradaxa. Last echocardiogram one year ago showed normal left ventricular systolic function and normal function of his aortic valve bioprosthesis.  Most recent labs from 02/27/2013 were reviewed with a cholesterol of 174, triglycerides 148, HDL 39, and LDL 106.  He continues to be plagued with chronic chest wall pain. He wakes up at night with chest discomfort. Pain is worse in certain positions. His chest is tender to the touch. Also has pain with coughing.  His chronic dyspnea is slowly progressive. He's had no leg swelling, orthopnea, or PND.  He was recently seen by Dr. Scot Dock and felt to be stable with respect to his carotid stenosis. He does have an asymptomatic left subclavian artery stenosis and medical therapy has been recommended. Outpatient Encounter Prescriptions as of 04/15/2013  Medication Sig  . apixaban (ELIQUIS) 5 MG TABS tablet Take 1 tablet (5 mg total) by mouth 2 (two) times daily.  . furosemide (LASIX) 40 MG tablet TAKE 1 TABLET DAILY  . meclizine (ANTIVERT) 25 MG tablet Take 25 mg by mouth 3 (three) times daily as needed for dizziness.  Marland Kitchen omeprazole (PRILOSEC OTC) 20 MG tablet Take 2 tablets (40 mg total) by mouth 2 (two) times daily.  Marland Kitchen oxyCODONE (OXY IR/ROXICODONE) 5 MG immediate release tablet Take 1 tablet (5 mg total) by mouth every 4 (four) hours as needed for pain.  . potassium chloride SA (K-DUR,KLOR-CON) 20 MEQ tablet TAKE 1 TABLET DAILY  . pravastatin (PRAVACHOL) 40 MG tablet TAKE 1 TABLET DAILY  . [DISCONTINUED] pravastatin (PRAVACHOL) 40 MG tablet Take 40 mg by mouth every evening.     No Known Allergies  Past Medical History  Diagnosis Date  . GERD with stricture     w/hx of stricture  . PVD  (peripheral vascular disease) 12/07    per cath 12/07....iliac  . Carotid artery occlusion     last u/s 3-11...Marland Kitchenper vasc.surgery 2007 cath: nonobstructive CAD. mil;d MS, trivial AoS, small AAA,  . Bowen's disease     right arm BX: referred to dermatology  . Hyperlipidemia   . Emphysema     PFT 4/10 FVC 3.45 (90%), FEV1 2.72 (106%), TLC 5.36 (93%), DLCO 74%, NO BD RESPONSE: ? OF PSEUDONORMALIZATION OF PFT FINDINGS W/BOTH OBSTRUCTIVE AND RESTRICTIVE DEFECTS.  Marland Kitchen AAA (abdominal aortic aneurysm) 05/2008    PER CARDIAC CATH  . Aortic stenosis 05/2008    PER CARDIAC CATH, MILD, MITRAL STENOSIS  . Mitral stenosis 05/2008    PER CARDIAC CATH  . Coronary artery disease 05/2008    MILD, MEDICAL MANAGEMENT  . Pulmonary hypertension   . Hypertension 02/21/11    pt denies this hx  . Dyspnea      w/u included a cath and PFT's, sxms thought to be pulmonary  . Chronic back pain   . Peripheral neuropathy     right thigh; "it's been that way for years"  . Diabetes mellitus 09/07/2011  . Benign neoplasm of colon   . Stricture and stenosis of esophagus   . COPD (chronic obstructive pulmonary disease)     ROS: Negative except as per HPI  BP 122/66  Pulse 87  Ht 5\' 7"  (1.702 m)  Wt 219 lb (99.338 kg)  BMI 34.29 kg/m2  PHYSICAL EXAM: Pt is alert and oriented, elderly male in  NAD HEENT: normal Neck: JVP - normal, carotids 2+= with bilateral bruits Lungs: CTA bilaterally CV: RRR with a grade 2/6 systolic murmur at the LSB Abd: soft, NT, Positive BS, no hepatomegaly Ext: no C/C/E Skin: warm/dry no rash  EKG:  Normal sinus rhythm with premature supraventricular complexes. Right bundle branch block, left axis deviation. Heart rate 87 beats per minute.  2-D echocardiogram July 2013: Study Conclusions  - Left ventricle: The cavity size was normal. Wall thickness was increased in a pattern of moderate LVH. Systolic function was normal. The estimated ejection fraction was in the range of 55% to  65%. There was dynamic obstruction. Wall motion was normal; there were no regional wall motion abnormalities. Doppler parameters are consistent with abnormal left ventricular relaxation (grade 1 diastolic dysfunction). - Aortic valve: A prosthesis was present and functioning normally. The prosthesis had a normal range of motion. The sewing ring appeared normal, had no rocking motion, and showed no evidence of dehiscence. - Mitral valve: Calcified annulus. Mildly thickened leaflets . - Left atrium: The atrium was mildly dilated. - Atrial septum: No defect or patent foramen ovale was identified. - Pulmonary arteries: PA peak pressure: 53mm Hg (S).  ASSESSMENT AND PLAN: 1. CAD, native vessel 2. Aortic stenosis s/p AVR with normal valve function by post-operative echo 3. PAD (AAA, iliac occlusion, carotid stenosis, subclavian stenosis) - follow by vascular surgery (Dr Scot Dock) 4. Atrial Fibrillation, paroxysmal 5. COPD 6. Chronic post-sternotomy chest wall pain  Overall I think the patient is stable from a cardiovascular perspective. He is tolerating anticoagulation with Eliquis. He's had no bleeding complications. He's on a statin drug in daily Lasix. There were no signs of active congestive heart failure. I think his dyspnea is multifactorial (age, obesity, COPD, diastolic dysfunction). I'm going to check with Dr. Cyndia Bent about whether referral to a pain management specialist might be helpful for his poststernotomy pain. I will see him back in 6 months for followup.  [name] 04/15/2013 11:24 AM

## 2013-04-20 ENCOUNTER — Telehealth: Payer: Self-pay | Admitting: Cardiovascular Disease

## 2013-04-20 NOTE — Telephone Encounter (Signed)
Spoke with pts granddaughter, she wanted an update from pts last visit.  States she usually comes to his visits but had to miss this last one.  Updated on plan of care.   She states that Dr. Vivi Martens office called and he has an appointment coming up this Wednesday with Dr. Cyndia Bent to discuss his post sternotomy pain.

## 2013-04-20 NOTE — Telephone Encounter (Signed)
New Prob    Requesting to speak to nurse regarding pts last visit. Please call.

## 2013-04-21 IMAGING — CR DG CHEST 2V
2 series · 2 of 2 positions shown · non-contrast
Comparison: 02/24/2011

CLINICAL DATA: None for aortic valve replacement

CHEST - 2 VIEW

[view not recorded (1 of 2)]
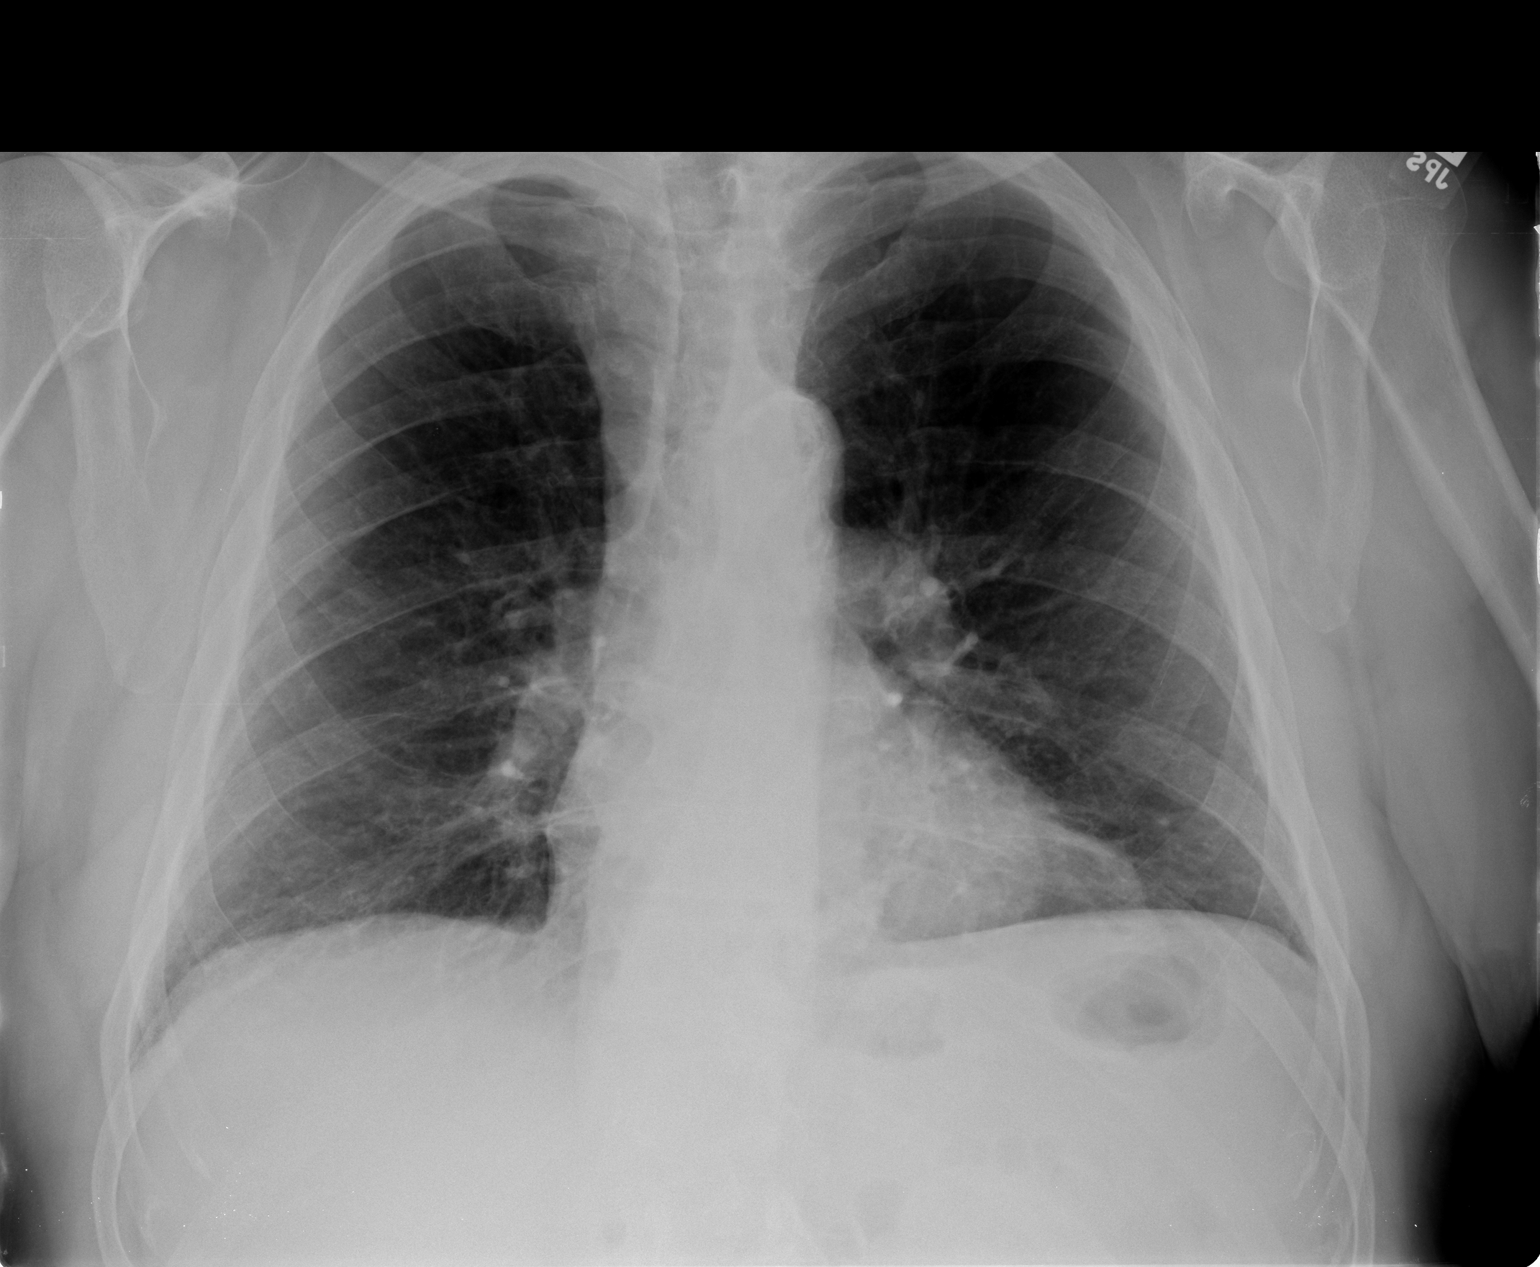

[view not recorded (2 of 2)]
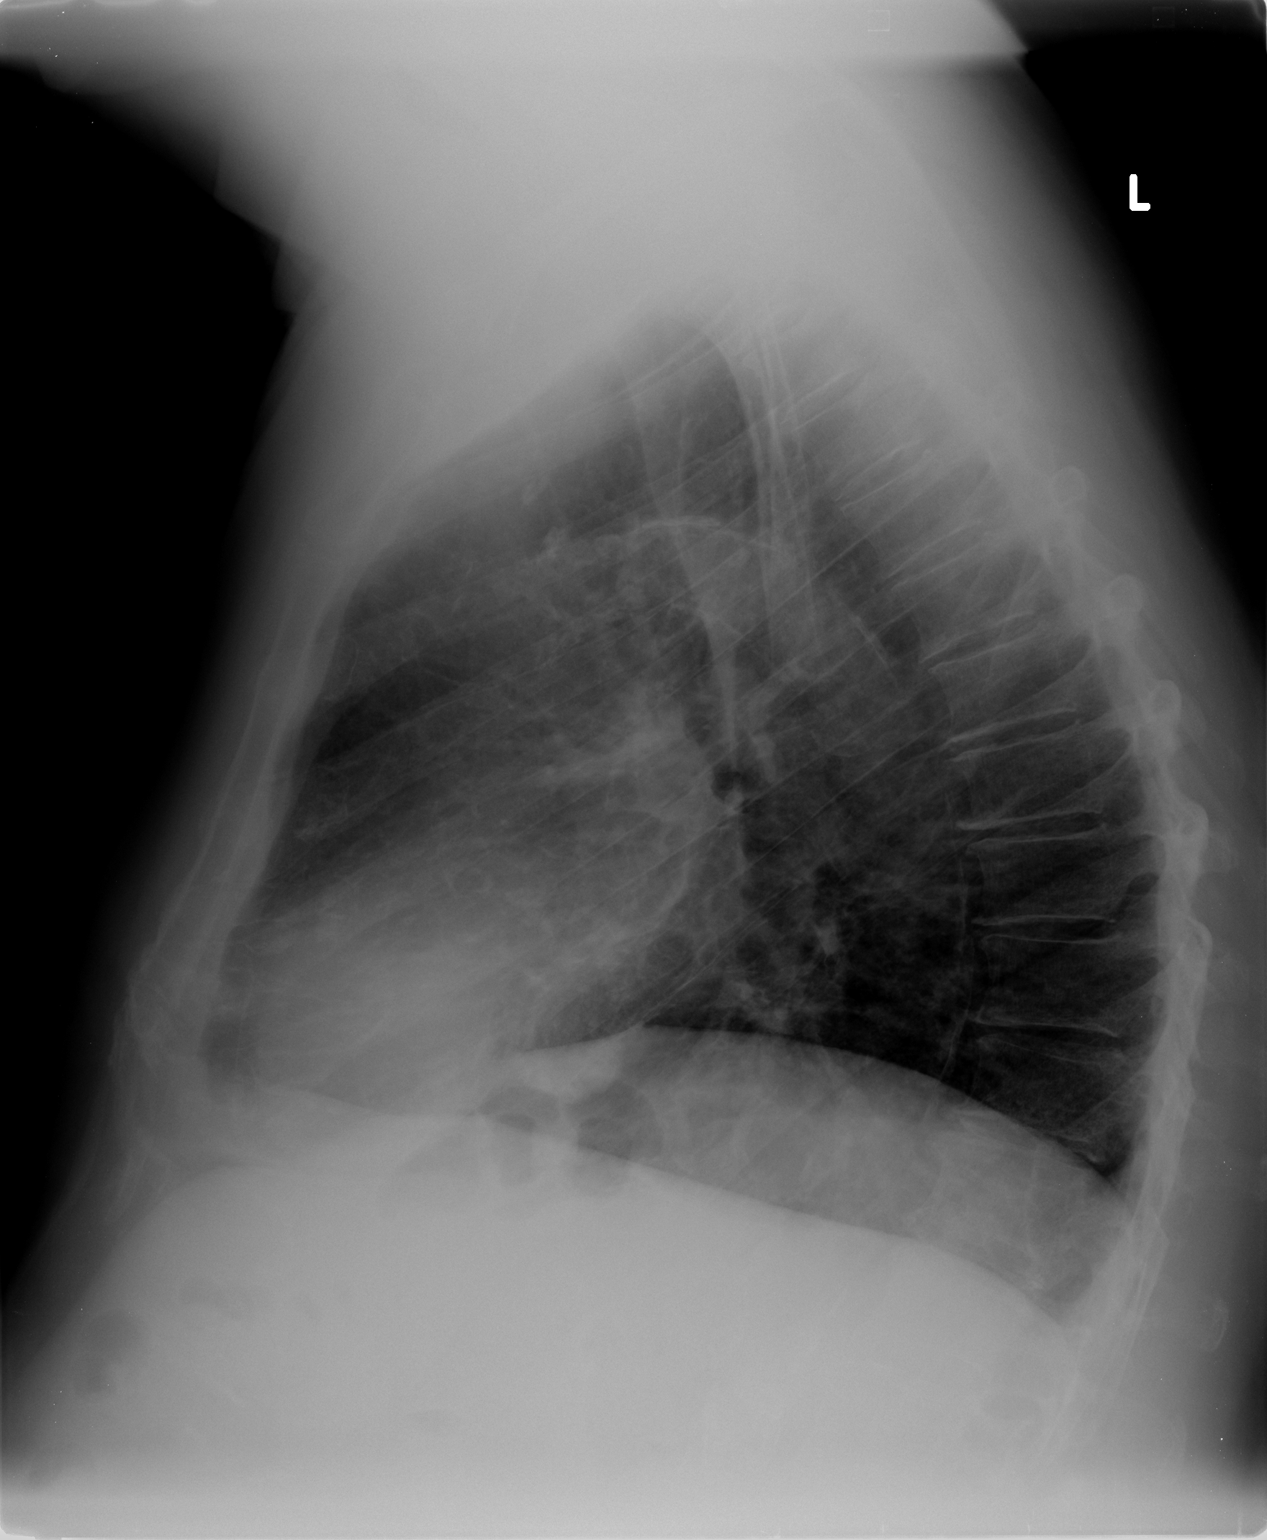

[2 of 2 positions shown; findings below may reference images not displayed]

FINDINGS: Cardiomediastinal silhouette is unremarkable.  Mitral
annulus calcifications are noted.  No acute infiltrate or pulmonary
edema.  Prominent fat pad is noted left cardiophrenic angle.  Bony
thorax is stable. Atherosclerotic calcifications of thoracic aorta
again noted.  Small hiatal hernia.
IMPRESSION: No active disease.

## 2013-04-22 ENCOUNTER — Ambulatory Visit
Admission: RE | Admit: 2013-04-22 | Discharge: 2013-04-22 | Disposition: A | Discharge status: Home / Self Care | Payer: Medicare Other | Source: Ambulatory Visit | Attending: Surgery | Admitting: Surgery

## 2013-04-22 ENCOUNTER — Encounter: Payer: Self-pay | Admitting: Surgery

## 2013-04-22 ENCOUNTER — Institutional Professional Consult (permissible substitution) (INDEPENDENT_AMBULATORY_CARE_PROVIDER_SITE_OTHER): Payer: Medicare Other | Admitting: Surgery

## 2013-04-22 ENCOUNTER — Other Ambulatory Visit: Payer: Self-pay | Admitting: *Deleted

## 2013-04-22 VITALS — BP 152/73 | HR 109 | Resp 18 | Ht 67.0 in | Wt 219.0 lb

## 2013-04-22 DIAGNOSIS — Z952 Presence of prosthetic heart valve: Secondary | ICD-10-CM

## 2013-04-22 DIAGNOSIS — I359 Nonrheumatic aortic valve disorder, unspecified: Secondary | ICD-10-CM

## 2013-04-22 DIAGNOSIS — Z954 Presence of other heart-valve replacement: Secondary | ICD-10-CM

## 2013-04-22 DIAGNOSIS — R0789 Other chest pain: Secondary | ICD-10-CM

## 2013-04-22 DIAGNOSIS — R072 Precordial pain: Secondary | ICD-10-CM

## 2013-04-22 DIAGNOSIS — R071 Chest pain on breathing: Secondary | ICD-10-CM | POA: Diagnosis not present

## 2013-04-22 DIAGNOSIS — I350 Nonrheumatic aortic (valve) stenosis: Secondary | ICD-10-CM

## 2013-04-24 ENCOUNTER — Encounter (HOSPITAL_COMMUNITY): Payer: Self-pay | Admitting: Pharmacy Technician

## 2013-04-24 IMAGING — CR DG CHEST 1V PORT
1 series · 1 of 1 positions shown · non-contrast
Comparison: Portable chest 08/17/2011.

CLINICAL DATA: Status post CABG.

PORTABLE CHEST - 1 VIEW

[AP]
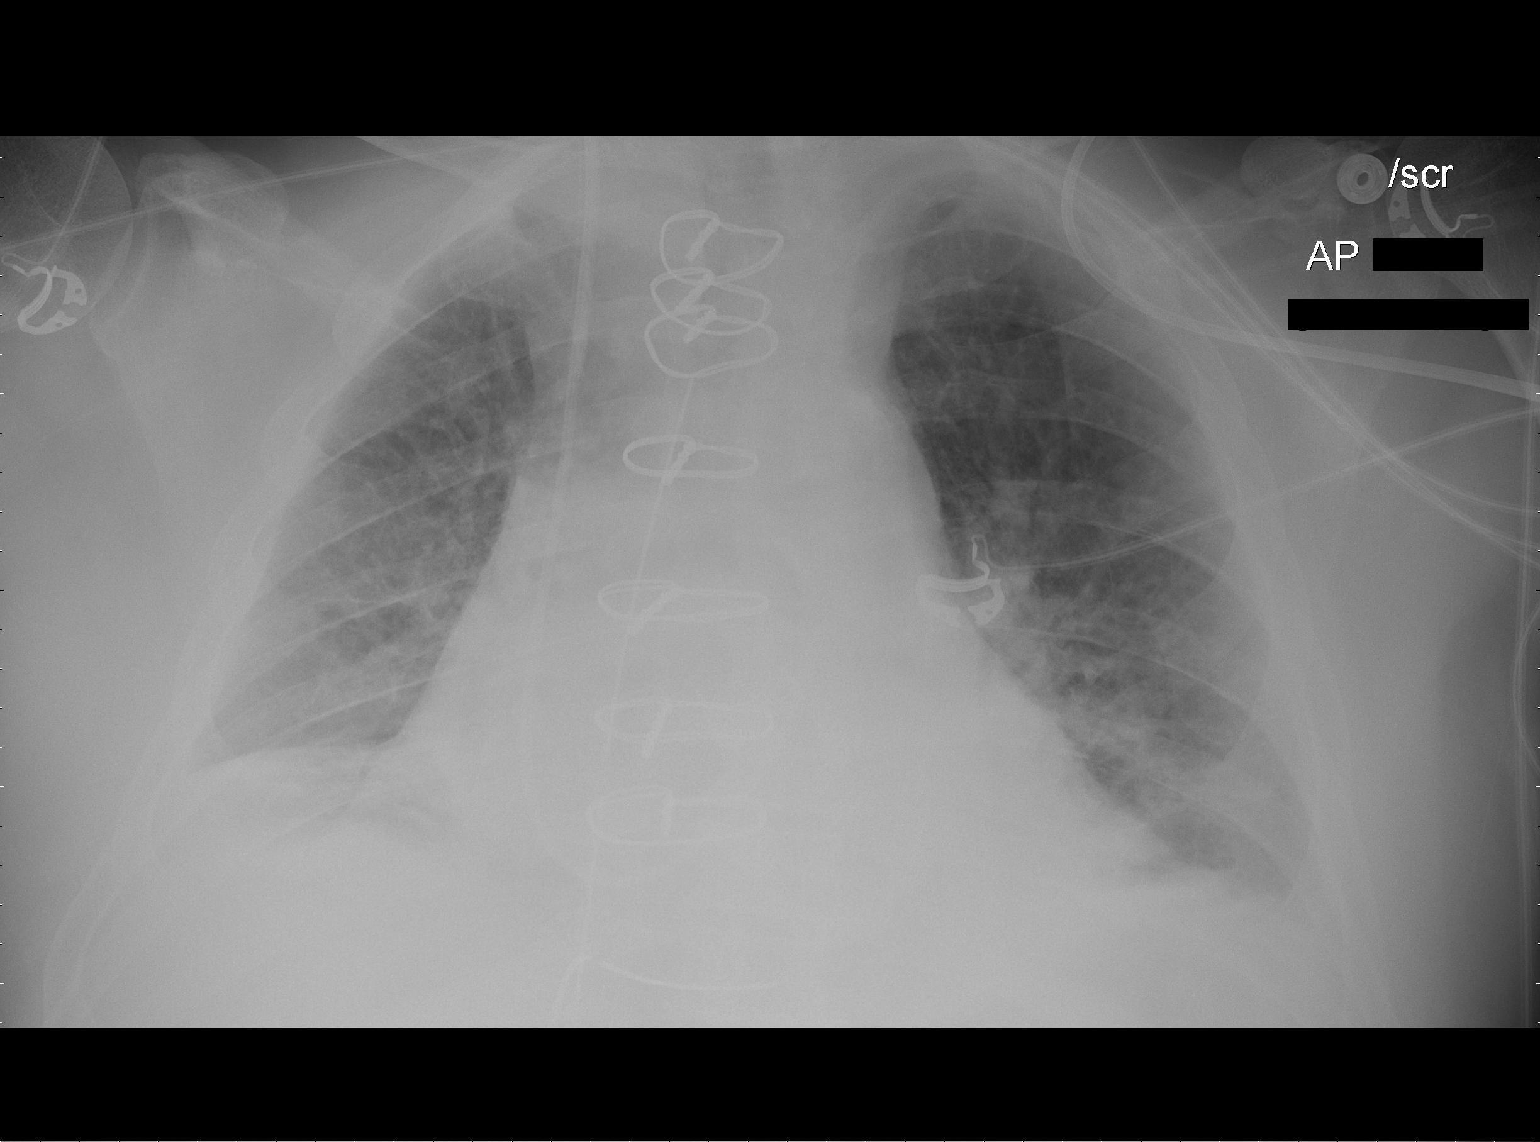

[1 of 1 positions shown; findings below may reference images not displayed]

FINDINGS: The patient has been extubated.  The Swan-Ganz catheter
is stable in position.  A mediastinal drain and left-sided chest
tube are stable.  The heart is enlarged.  Small bilateral pleural
effusions persist.  Mild pulmonary vascular congestion is noted.
Mild bibasilar atelectasis is evident.  There is slight improved
aeration at the left base.
IMPRESSION: 1.  Status post extubation.
2.  Slight improved aeration at the left base.
3.  The support apparatus is otherwise stable.
4.  Persistent bilateral pleural effusions and left greater than
right atelectasis.

## 2013-04-25 ENCOUNTER — Encounter: Payer: Self-pay | Admitting: Surgery

## 2013-04-25 NOTE — Progress Notes (Signed)
PCP is [name], MD Referring Provider is [name], MD  Chief Complaint  Patient presents with  . Routine Post Op    continues with sternal pain s/p AVR 08/17/11...cxr    HPI: The patient is a 80 year old gentleman who underwent AVR with a bioprosthetic valve by me on 08/17/2011. His postop course was uncomplicated. He also has a hx of PAF and has been maintained on Pradaxa. He was recently seen by Dr. Burt Knack and was complaining of mid-sternal chest pain that he says started a few months after surgery and has persisted. It wakes him up at night and has been made worse with coughing and certain positions such a lying down.  Past Medical History  Diagnosis Date  . GERD with stricture     w/hx of stricture  . PVD (peripheral vascular disease) 12/07    per cath 12/07....iliac  . Carotid artery occlusion     last u/s 3-11...Marland Kitchenper vasc.surgery 2007 cath: nonobstructive CAD. mil;d MS, trivial AoS, small AAA,  . Bowen's disease     right arm BX: referred to dermatology  . Hyperlipidemia   . Emphysema     PFT 4/10 FVC 3.45 (90%), FEV1 2.72 (106%), TLC 5.36 (93%), DLCO 74%, NO BD RESPONSE: ? OF PSEUDONORMALIZATION OF PFT FINDINGS W/BOTH OBSTRUCTIVE AND RESTRICTIVE DEFECTS.  Marland Kitchen AAA (abdominal aortic aneurysm) 05/2008    PER CARDIAC CATH  . Aortic stenosis 05/2008    PER CARDIAC CATH, MILD, MITRAL STENOSIS  . Mitral stenosis 05/2008    PER CARDIAC CATH  . Coronary artery disease 05/2008    MILD, MEDICAL MANAGEMENT  . Pulmonary hypertension   . Hypertension 02/21/11    pt denies this hx  . Dyspnea      w/u included a cath and PFT's, sxms thought to be pulmonary  . Chronic back pain   . Peripheral neuropathy     right thigh; "it's been that way for years"  . Diabetes mellitus 09/07/2011  . Benign neoplasm of colon   . Stricture and stenosis of esophagus   . COPD (chronic obstructive pulmonary disease)     Past Surgical History  Procedure Laterality Date  . Appendectomy    .  Esophageal dilation    . Cardiac catheterization  02/2011    LAD 50, CFX OK, RCA40, EF 55%; PCWP 17, AoV  0.9 cm squared  . Endarterectomy  08-2010    Right carotid endarterectomy    . Aortic valve replacement  08/17/2011    Procedure: AORTIC VALVE REPLACEMENT (AVR);  Surgeon: Gaye Pollack, MD;  Location: [facility];  Service: Open Heart Surgery;  Laterality: N/A;    Family History  Problem Relation Age of Onset  . Heart attack Father 24    MI  . Diabetes Neg Hx   . Colon cancer Neg Hx   . Prostate cancer Neg Hx   . Anesthesia problems Neg Hx   . Hypotension Neg Hx   . Malignant hyperthermia Neg Hx   . Pseudochol deficiency Neg Hx   . Stomach cancer Neg Hx     Social History History  Substance Use Topics  . Smoking status: Former Smoker -- 2.00 packs/day for 30 years    Types: Cigarettes    Quit date: 04/02/1986  . Smokeless tobacco: Never Used     Comment: quit smoking cigarettes 04/1987  . Alcohol Use: No    Current Outpatient Prescriptions  Medication Sig Dispense Refill  . apixaban (ELIQUIS)  5 MG TABS tablet Take 1 tablet (5 mg total) by mouth 2 (two) times daily.  60 tablet  6  . meclizine (ANTIVERT) 25 MG tablet Take 25 mg by mouth daily as needed for dizziness.       Marland Kitchen omeprazole (PRILOSEC OTC) 20 MG tablet Take 2 tablets (40 mg total) by mouth 2 (two) times daily.  120 tablet  0  . acetaminophen (TYLENOL) 500 MG tablet Take 1,000 mg by mouth daily as needed (pain).      . furosemide (LASIX) 40 MG tablet Take 40 mg by mouth daily.      . potassium chloride SA (K-DUR,KLOR-CON) 20 MEQ tablet Take 20 mEq by mouth daily.      . pravastatin (PRAVACHOL) 40 MG tablet Take 40 mg by mouth at bedtime.      . traMADol (ULTRAM) 50 MG tablet Take 50 mg by mouth every 4 (four) hours as needed (pain).       No current facility-administered medications for this visit.    No Known Allergies  Review of Systems  Constitutional: Negative.   HENT: Negative.   Eyes: Negative.     Respiratory: Negative.   Cardiovascular: Positive for chest pain. Negative for palpitations and leg swelling.  Gastrointestinal: Negative.   Endocrine: Negative.   Genitourinary: Negative.   Musculoskeletal: Negative.   Skin: Negative.   Allergic/Immunologic: Negative.   Neurological: Negative.   Hematological: Negative.   Psychiatric/Behavioral: Positive for suicidal ideas.    BP 152/73  Pulse 109  Resp 18  Ht 5\' 7"  (1.702 m)  Wt 219 lb (99.338 kg)  BMI 34.29 kg/m2  SpO2 97% Physical Exam  Constitutional: He is oriented to person, place, and time. He appears well-developed and well-nourished. No distress.  HENT:  Head: Normocephalic and atraumatic.  Mouth/Throat: Oropharynx is clear and moist.  Eyes: EOM are normal. Pupils are equal, round, and reactive to light.  Neck: Normal range of motion. Neck supple. No JVD present. No tracheal deviation present. No thyromegaly present.  Cardiovascular: Normal rate, regular rhythm, normal heart sounds and intact distal pulses.   No murmur heard. Pulmonary/Chest: Effort normal and breath sounds normal. No stridor. No respiratory distress. He has no rales. He exhibits tenderness.  Tender over the middle of sternum along the midline. There is no movement of the sternum with coughing. No wires are sticking up. No erythema. Incision well-healed.  Abdominal: Soft. Bowel sounds are normal. He exhibits no distension and no mass. There is no tenderness.  Musculoskeletal: Normal range of motion. He exhibits no edema and no tenderness.  Lymphadenopathy:    He has no cervical adenopathy.  Neurological: He is alert and oriented to person, place, and time. He has normal strength. No cranial nerve deficit or sensory deficit.  Skin: Skin is warm and dry.  Psychiatric: He has a normal mood and affect.     Diagnostic Tests:  CLINICAL DATA: Sternal pain. Heart surgery 2013  EXAM:  CHEST 2 VIEW  COMPARISON: 11/20/2012  FINDINGS:  Aortic valve  replacement. Heart size and vascularity are normal.  Increased lung markings in the bases are stable and consistent with  fibrosis. Negative for pneumonia or effusion.  IMPRESSION:  Chronic lung disease in the bases. No superimposed acute  abnormality.  Electronically Signed  By: Franchot Gallo M.D.  On: 04/22/2013 15:18        Impression:  He has chronic pain over the midline of the sternum with point tenderness over the lower sternal wires. His  sternum feels stable. It is possible that his pain is related to the sternal wires and I think removal of the lower wires is reasonable. I told the patient and his daughter that it may not resolve his pain but it is really the only thing to try at this point and I have seen this type of pain completely resolve before after wire removal. I discussed the surgical procedure, alternatives, benefits and risks with the patient and his daughter and he would like to proceed. He will stop his Pradaxa 5 days before and I will remove the wires in the OR under general anesthesia on Friday 05/01/2013.  Plan:  Sternal wire removal on 05/01/2013.

## 2013-04-28 ENCOUNTER — Ambulatory Visit (HOSPITAL_COMMUNITY)
Admission: RE | Admit: 2013-04-28 | Discharge: 2013-04-28 | Disposition: A | Discharge status: Home / Self Care | Payer: Medicare Other | Source: Ambulatory Visit | Attending: Surgery | Admitting: Surgery

## 2013-04-28 ENCOUNTER — Encounter (HOSPITAL_COMMUNITY): Payer: Self-pay

## 2013-04-28 ENCOUNTER — Encounter (HOSPITAL_COMMUNITY)
Admission: RE | Admit: 2013-04-28 | Discharge: 2013-04-28 | Disposition: A | Discharge status: Home / Self Care | Payer: Medicare Other | Source: Ambulatory Visit | Attending: Surgery | Admitting: Surgery

## 2013-04-28 VITALS — BP 143/79 | HR 85 | Temp 97.6°F | Resp 18 | Ht 67.0 in | Wt 219.4 lb

## 2013-04-28 DIAGNOSIS — Z01812 Encounter for preprocedural laboratory examination: Secondary | ICD-10-CM | POA: Diagnosis not present

## 2013-04-28 DIAGNOSIS — Z01818 Encounter for other preprocedural examination: Secondary | ICD-10-CM | POA: Insufficient documentation

## 2013-04-28 DIAGNOSIS — I252 Old myocardial infarction: Secondary | ICD-10-CM | POA: Diagnosis not present

## 2013-04-28 DIAGNOSIS — J4489 Other specified chronic obstructive pulmonary disease: Secondary | ICD-10-CM | POA: Insufficient documentation

## 2013-04-28 DIAGNOSIS — Z87891 Personal history of nicotine dependence: Secondary | ICD-10-CM | POA: Insufficient documentation

## 2013-04-28 DIAGNOSIS — I2789 Other specified pulmonary heart diseases: Secondary | ICD-10-CM | POA: Diagnosis not present

## 2013-04-28 DIAGNOSIS — E669 Obesity, unspecified: Secondary | ICD-10-CM | POA: Insufficient documentation

## 2013-04-28 DIAGNOSIS — J984 Other disorders of lung: Secondary | ICD-10-CM | POA: Insufficient documentation

## 2013-04-28 DIAGNOSIS — J449 Chronic obstructive pulmonary disease, unspecified: Secondary | ICD-10-CM | POA: Insufficient documentation

## 2013-04-28 DIAGNOSIS — Z954 Presence of other heart-valve replacement: Secondary | ICD-10-CM | POA: Insufficient documentation

## 2013-04-28 DIAGNOSIS — E119 Type 2 diabetes mellitus without complications: Secondary | ICD-10-CM | POA: Diagnosis not present

## 2013-04-28 DIAGNOSIS — R0789 Other chest pain: Secondary | ICD-10-CM

## 2013-04-28 LAB — TYPE AND SCREEN
ABO/RH(D): A POS
ANTIBODY SCREEN: NEGATIVE

## 2013-04-28 LAB — URINALYSIS, ROUTINE W REFLEX MICROSCOPIC
Bilirubin Urine: NEGATIVE
GLUCOSE, UA: NEGATIVE mg/dL
HGB URINE DIPSTICK: NEGATIVE
Ketones, ur: NEGATIVE mg/dL
Nitrite: NEGATIVE
Protein, ur: NEGATIVE mg/dL
SPECIFIC GRAVITY, URINE: 1.013 (ref 1.005–1.030)
UROBILINOGEN UA: 0.2 mg/dL (ref 0.0–1.0)
pH: 6 (ref 5.0–8.0)

## 2013-04-28 LAB — SURGICAL PCR SCREEN
MRSA, PCR: NEGATIVE
Staphylococcus aureus: NEGATIVE

## 2013-04-28 LAB — BLOOD GAS, ARTERIAL
Acid-Base Excess: 1.1 mmol/L (ref 0.0–2.0)
Bicarbonate: 24.9 mEq/L — ABNORMAL HIGH (ref 20.0–24.0)
DRAWN BY: 181601
O2 Saturation: 97.8 %
PH ART: 7.431 (ref 7.350–7.450)
PO2 ART: 94.8 mmHg (ref 80.0–100.0)
Patient temperature: 98.6
TCO2: 26.1 mmol/L (ref 0–100)
pCO2 arterial: 38.1 mmHg (ref 35.0–45.0)

## 2013-04-28 LAB — CBC
HCT: 42.4 % (ref 39.0–52.0)
HEMOGLOBIN: 14.7 g/dL (ref 13.0–17.0)
MCH: 29.5 pg (ref 26.0–34.0)
MCHC: 34.7 g/dL (ref 30.0–36.0)
MCV: 85 fL (ref 78.0–100.0)
PLATELETS: 260 10*3/uL (ref 150–400)
RBC: 4.99 MIL/uL (ref 4.22–5.81)
RDW: 14.7 % (ref 11.5–15.5)
WBC: 8.6 10*3/uL (ref 4.0–10.5)

## 2013-04-28 LAB — COMPREHENSIVE METABOLIC PANEL
ALK PHOS: 67 U/L (ref 39–117)
ALT: 10 U/L (ref 0–53)
AST: 23 U/L (ref 0–37)
Albumin: 3.7 g/dL (ref 3.5–5.2)
BUN: 9 mg/dL (ref 6–23)
CALCIUM: 9.2 mg/dL (ref 8.4–10.5)
CO2: 22 mEq/L (ref 19–32)
Chloride: 103 mEq/L (ref 96–112)
Creatinine, Ser: 1.07 mg/dL (ref 0.50–1.35)
GFR calc Af Amer: 75 mL/min — ABNORMAL LOW (ref >90)
GFR, EST NON AFRICAN AMERICAN: 65 mL/min — AB (ref >90)
Glucose, Bld: 98 mg/dL (ref 70–99)
Potassium: 4.4 mEq/L (ref 3.7–5.3)
SODIUM: 140 meq/L (ref 137–147)
Total Bilirubin: 0.8 mg/dL (ref 0.3–1.2)
Total Protein: 7 g/dL (ref 6.0–8.3)

## 2013-04-28 LAB — PROTIME-INR
INR: 0.93 (ref 0.00–1.49)
Prothrombin Time: 12.3 seconds (ref 11.6–15.2)

## 2013-04-28 LAB — URINE MICROSCOPIC-ADD ON

## 2013-04-28 LAB — APTT: APTT: 29 s (ref 24–37)

## 2013-04-28 NOTE — Progress Notes (Addendum)
Cardiologist is Dr.Cooper   Multiple echo reports in epic   Stress test reports in epic from 2001 and 2014  Heart cath in epic from 02-2011  Last cardiology visit in epic from 04-15-13  EKG in epic from 04-15-13  Medical Md is Dr.Jose Larose Kells

## 2013-04-28 NOTE — Pre-Procedure Instructions (Signed)
[name]  04/28/2013   Your procedure is scheduled on:  Fri, Jan 30 @ 7:30 AM  Report to [facility]  at 5:30 AM.  Call this number if you have problems the morning of surgery: [phone]   Remember:   Do not eat food or drink liquids after midnight.   Take these medicines the morning of surgery with A SIP OF WATER: Meclizine(Antivert),Omeprazole(Prilosec),and Tramadol(Ultram)              No Goody's,BC's,Aleve,Aspirin,Fish Oil,or any Herbal Medications   Do not wear jewelry  Do not wear lotions, powders, or colognes. You may wear deodorant.  Men may shave face and neck.  Do not bring valuables to the hospital.  [facility] is not responsible                  for any belongings or valuables.               Contacts, dentures or bridgework may not be worn into surgery.  Leave suitcase in the car. After surgery it may be brought to your room.  For patients admitted to the hospital, discharge time is determined by your                treatment team.                 Special Instructions: Shower using CHG 2 nights before surgery and the night before surgery.  If you shower the day of surgery use CHG.  Use special wash - you have one bottle of CHG for all showers.  You should use approximately 1/3 of the bottle for each shower.   Please read over the following fact sheets that you were given: Pain Booklet, Coughing and Deep Breathing, Blood Transfusion Information, MRSA Information and Surgical Site Infection Prevention

## 2013-04-29 NOTE — Progress Notes (Signed)
Anesthesia Chart Review:  Patient is a 80 year old male scheduled for sternal wire removal on 05/01/13 by Dr. Cyndia Bent.  He is s/p AVR (pericardial tissue) on 08/17/11. Other history includes former smoker, obesity, GERD, PAD with left SCA steal and right CEA '12, AAA (3.7 cm by CT 11/20/12), CAD/MI in the setting of afib with RVR '12, HLD, pulmonary HTN, DM2, peripheral neuropathy, COPD, esophogeal dilation. Cardiologist is Dr. Burt Knack.  PCP is Dr. Larose Kells. Vascular surgeon is Dr. Scot Dock.  EKG on 04/15/13 showed SR, PACs, LAD, right BBB, LVH.  Nuclear stress test on 11/22/12 showed: no induced ischemia, EF 62%.  Echo on 10/12/11 showed:  - Left ventricle: The cavity size was normal. Wall thickness was increased in a pattern of moderate LVH. Systolic function was normal. The estimated ejection fraction was in the range of 55% to 65%. There was dynamic obstruction. Wall motion was normal; there were no regional wall motion abnormalities. Doppler parameters are consistent with abnormal left ventricular relaxation (grade 1 diastolic dysfunction). - Aortic valve: A prosthesis was present and functioning normally. The prosthesis had a normal range of motion. The sewing ring appeared normal, had no rocking motion, and showed no evidence of dehiscence. - Mitral valve: Calcified annulus. Mildly thickened leaflets. - Left atrium: The atrium was mildly dilated. - Atrial septum: No defect or patent foramen ovale was identified. - Pulmonary arteries: PA peak pressure: 54mm Hg (S).  Cardiac cath (pre AVR) on 02/21/11 showed: 50% mid LAD, 30-40% proximal, mid, and distal RCA, EF 55-65%, severe AS.  Carotid duplex on 04/09/12 showed patent right CEA, < 29% LICA stenosis, left ECA stenosis with left SCA steal.  Preoperative CXR and labs noted.  Anticipate that he can proceed as planned.  [facility]Anesthesiology Phone [phone] 04/29/2013 9:37 AM

## 2013-04-30 MED ORDER — DEXTROSE 5 % IV SOLN
1.5000 g | INTRAVENOUS | Status: AC
  Administered 2013-05-01: 1.5 g via INTRAVENOUS
  Filled 2013-04-30: qty 1.5

## 2013-05-01 ENCOUNTER — Encounter (HOSPITAL_COMMUNITY)
Admission: RE | Disposition: A | Discharge status: Home / Self Care | Payer: Self-pay | Source: Ambulatory Visit | Attending: Surgery

## 2013-05-01 ENCOUNTER — Ambulatory Visit (HOSPITAL_COMMUNITY): Payer: Medicare Other | Admitting: Certified Registered"

## 2013-05-01 ENCOUNTER — Encounter (HOSPITAL_COMMUNITY): Payer: Medicare Other | Admitting: Vascular Surgery

## 2013-05-01 ENCOUNTER — Encounter (HOSPITAL_COMMUNITY): Payer: Self-pay | Admitting: *Deleted

## 2013-05-01 ENCOUNTER — Ambulatory Visit (HOSPITAL_COMMUNITY)
Admission: RE | Admit: 2013-05-01 | Discharge: 2013-05-01 | Disposition: A | Discharge status: Home / Self Care | Payer: Medicare Other | Source: Ambulatory Visit | Attending: Surgery | Admitting: Surgery

## 2013-05-01 DIAGNOSIS — R079 Chest pain, unspecified: Secondary | ICD-10-CM | POA: Diagnosis not present

## 2013-05-01 DIAGNOSIS — K219 Gastro-esophageal reflux disease without esophagitis: Secondary | ICD-10-CM | POA: Diagnosis not present

## 2013-05-01 DIAGNOSIS — E785 Hyperlipidemia, unspecified: Secondary | ICD-10-CM | POA: Insufficient documentation

## 2013-05-01 DIAGNOSIS — I2789 Other specified pulmonary heart diseases: Secondary | ICD-10-CM | POA: Insufficient documentation

## 2013-05-01 DIAGNOSIS — Z472 Encounter for removal of internal fixation device: Secondary | ICD-10-CM | POA: Insufficient documentation

## 2013-05-01 DIAGNOSIS — Z954 Presence of other heart-valve replacement: Secondary | ICD-10-CM | POA: Diagnosis not present

## 2013-05-01 DIAGNOSIS — I4891 Unspecified atrial fibrillation: Secondary | ICD-10-CM | POA: Insufficient documentation

## 2013-05-01 DIAGNOSIS — Z7901 Long term (current) use of anticoagulants: Secondary | ICD-10-CM | POA: Diagnosis not present

## 2013-05-01 DIAGNOSIS — I739 Peripheral vascular disease, unspecified: Secondary | ICD-10-CM | POA: Diagnosis not present

## 2013-05-01 DIAGNOSIS — I2510 Atherosclerotic heart disease of native coronary artery without angina pectoris: Secondary | ICD-10-CM | POA: Insufficient documentation

## 2013-05-01 DIAGNOSIS — I10 Essential (primary) hypertension: Secondary | ICD-10-CM | POA: Diagnosis not present

## 2013-05-01 DIAGNOSIS — R0789 Other chest pain: Secondary | ICD-10-CM

## 2013-05-01 DIAGNOSIS — E119 Type 2 diabetes mellitus without complications: Secondary | ICD-10-CM | POA: Diagnosis not present

## 2013-05-01 DIAGNOSIS — J438 Other emphysema: Secondary | ICD-10-CM | POA: Diagnosis not present

## 2013-05-01 DIAGNOSIS — J449 Chronic obstructive pulmonary disease, unspecified: Secondary | ICD-10-CM | POA: Diagnosis not present

## 2013-05-01 HISTORY — PX: STERNAL WIRES REMOVAL: SHX2441

## 2013-05-01 LAB — GLUCOSE, CAPILLARY
GLUCOSE-CAPILLARY: 110 mg/dL — AB (ref 70–99)
Glucose-Capillary: 118 mg/dL — ABNORMAL HIGH (ref 70–99)

## 2013-05-01 SURGERY — REMOVAL, STERNAL WIRE
Anesthesia: General

## 2013-05-01 MED ORDER — SODIUM CHLORIDE 0.9 % IJ SOLN
INTRAMUSCULAR | Status: AC
  Filled 2013-05-01: qty 10

## 2013-05-01 MED ORDER — LACTATED RINGERS IV SOLN
INTRAVENOUS | Status: DC | PRN
  Administered 2013-05-01: 08:00:00 via INTRAVENOUS

## 2013-05-01 MED ORDER — PHENYLEPHRINE 40 MCG/ML (10ML) SYRINGE FOR IV PUSH (FOR BLOOD PRESSURE SUPPORT)
PREFILLED_SYRINGE | INTRAVENOUS | Status: AC
  Filled 2013-05-01: qty 10

## 2013-05-01 MED ORDER — ARTIFICIAL TEARS OP OINT
TOPICAL_OINTMENT | OPHTHALMIC | Status: DC | PRN
  Administered 2013-05-01: 1 via OPHTHALMIC

## 2013-05-01 MED ORDER — SUCCINYLCHOLINE CHLORIDE 20 MG/ML IJ SOLN
INTRAMUSCULAR | Status: AC
  Filled 2013-05-01: qty 1

## 2013-05-01 MED ORDER — FENTANYL CITRATE 0.05 MG/ML IJ SOLN
25.0000 ug | INTRAMUSCULAR | Status: DC | PRN
  Administered 2013-05-01 (×2): 25 ug via INTRAVENOUS

## 2013-05-01 MED ORDER — FENTANYL CITRATE 0.05 MG/ML IJ SOLN
INTRAMUSCULAR | Status: AC
  Filled 2013-05-01: qty 5

## 2013-05-01 MED ORDER — OXYCODONE HCL 5 MG PO TABS
ORAL_TABLET | ORAL | Status: AC
  Filled 2013-05-01: qty 1

## 2013-05-01 MED ORDER — LIDOCAINE HCL (CARDIAC) 20 MG/ML IV SOLN
INTRAVENOUS | Status: AC
  Filled 2013-05-01: qty 5

## 2013-05-01 MED ORDER — MIDAZOLAM HCL 2 MG/2ML IJ SOLN
INTRAMUSCULAR | Status: AC
  Filled 2013-05-01: qty 2

## 2013-05-01 MED ORDER — PROPOFOL 10 MG/ML IV BOLUS
INTRAVENOUS | Status: AC
  Filled 2013-05-01: qty 20

## 2013-05-01 MED ORDER — TRAMADOL HCL 50 MG PO TABS: 50.0000 mg | ORAL_TABLET | Freq: Four times a day (QID) | ORAL | Status: DC | PRN

## 2013-05-01 MED ORDER — ARTIFICIAL TEARS OP OINT
TOPICAL_OINTMENT | OPHTHALMIC | Status: AC
  Filled 2013-05-01: qty 3.5

## 2013-05-01 MED ORDER — FENTANYL CITRATE 0.05 MG/ML IJ SOLN
INTRAMUSCULAR | Status: DC | PRN
  Administered 2013-05-01: 25 ug via INTRAVENOUS
  Administered 2013-05-01: 50 ug via INTRAVENOUS
  Administered 2013-05-01: 25 ug via INTRAVENOUS
  Administered 2013-05-01: 50 ug via INTRAVENOUS

## 2013-05-01 MED ORDER — 0.9 % SODIUM CHLORIDE (POUR BTL) OPTIME
TOPICAL | Status: DC | PRN
  Administered 2013-05-01: 1000 mL

## 2013-05-01 MED ORDER — TRAMADOL HCL 50 MG PO TABS
ORAL_TABLET | ORAL | Status: AC
  Filled 2013-05-01: qty 1

## 2013-05-01 MED ORDER — EPHEDRINE SULFATE 50 MG/ML IJ SOLN
INTRAMUSCULAR | Status: AC
  Filled 2013-05-01: qty 1

## 2013-05-01 MED ORDER — PROPOFOL 10 MG/ML IV BOLUS
INTRAVENOUS | Status: DC | PRN
  Administered 2013-05-01: 170 mg via INTRAVENOUS

## 2013-05-01 MED ORDER — ONDANSETRON HCL 4 MG/2ML IJ SOLN
INTRAMUSCULAR | Status: DC | PRN
  Administered 2013-05-01: 4 mg via INTRAVENOUS

## 2013-05-01 MED ORDER — OXYCODONE HCL 5 MG/5ML PO SOLN: 5.0000 mg | Freq: Once | ORAL | Status: AC | PRN

## 2013-05-01 MED ORDER — ONDANSETRON HCL 4 MG/2ML IJ SOLN
INTRAMUSCULAR | Status: AC
  Filled 2013-05-01: qty 2

## 2013-05-01 MED ORDER — FENTANYL CITRATE 0.05 MG/ML IJ SOLN
INTRAMUSCULAR | Status: AC
  Administered 2013-05-01: 25 ug via INTRAVENOUS
  Filled 2013-05-01: qty 2

## 2013-05-01 MED ORDER — OXYCODONE HCL 5 MG PO TABS
ORAL_TABLET | ORAL | Status: AC
  Administered 2013-05-01: 5 mg via ORAL
  Filled 2013-05-01: qty 1

## 2013-05-01 MED ORDER — PHENYLEPHRINE HCL 10 MG/ML IJ SOLN
INTRAMUSCULAR | Status: DC | PRN
  Administered 2013-05-01 (×4): 80 ug via INTRAVENOUS
  Administered 2013-05-01: 40 ug via INTRAVENOUS
  Administered 2013-05-01 (×2): 80 ug via INTRAVENOUS

## 2013-05-01 MED ORDER — LIDOCAINE HCL (CARDIAC) 20 MG/ML IV SOLN
INTRAVENOUS | Status: DC | PRN
  Administered 2013-05-01: 70 mg via INTRAVENOUS

## 2013-05-01 MED ORDER — ROCURONIUM BROMIDE 50 MG/5ML IV SOLN
INTRAVENOUS | Status: AC
  Filled 2013-05-01: qty 1

## 2013-05-01 MED ORDER — OXYCODONE HCL 5 MG PO TABS
5.0000 mg | ORAL_TABLET | Freq: Once | ORAL | Status: AC | PRN
  Administered 2013-05-01: 5 mg via ORAL

## 2013-05-01 MED ORDER — ONDANSETRON HCL 4 MG/2ML IJ SOLN: 4.0000 mg | Freq: Four times a day (QID) | INTRAMUSCULAR | Status: DC | PRN

## 2013-05-01 SURGICAL SUPPLY — 55 items
ADH SKN CLS APL DERMABOND .7 (GAUZE/BANDAGES/DRESSINGS) ×1
ATTRACTOMAT 16X20 MAGNETIC DRP (DRAPES) ×3 IMPLANT
BAG DECANTER FOR FLEXI CONT (MISCELLANEOUS) ×3 IMPLANT
BANDAGE GAUZE ELAST BULKY 4 IN (GAUZE/BANDAGES/DRESSINGS) IMPLANT
BLADE SURG 10 STRL SS (BLADE) ×6 IMPLANT
CANISTER SUCTION 2500CC (MISCELLANEOUS) ×3 IMPLANT
CATH THORACIC 28FR RT ANG (CATHETERS) IMPLANT
CATH THORACIC 36FR (CATHETERS) IMPLANT
CATH THORACIC 36FR RT ANG (CATHETERS) IMPLANT
CLIP TI WIDE RED SMALL 24 (CLIP) IMPLANT
CONT SPEC 4OZ CLIKSEAL STRL BL (MISCELLANEOUS) IMPLANT
COVER SURGICAL LIGHT HANDLE (MISCELLANEOUS) ×6 IMPLANT
DERMABOND ADVANCED (GAUZE/BANDAGES/DRESSINGS) ×2
DERMABOND ADVANCED .7 DNX12 (GAUZE/BANDAGES/DRESSINGS) ×1 IMPLANT
DRAPE LAPAROSCOPIC ABDOMINAL (DRAPES) ×3 IMPLANT
DRAPE SLUSH/WARMER DISC (DRAPES) IMPLANT
ELECT REM PT RETURN 9FT ADLT (ELECTROSURGICAL) ×3
ELECTRODE REM PT RTRN 9FT ADLT (ELECTROSURGICAL) ×1 IMPLANT
GAUZE XEROFORM 5X9 LF (GAUZE/BANDAGES/DRESSINGS) IMPLANT
GLOVE EUDERMIC 7 POWDERFREE (GLOVE) ×3 IMPLANT
GOWN PREVENTION PLUS XLARGE (GOWN DISPOSABLE) ×3 IMPLANT
GOWN STRL NON-REIN LRG LVL3 (GOWN DISPOSABLE) ×12 IMPLANT
HANDPIECE INTERPULSE COAX TIP (DISPOSABLE)
HEMOSTAT POWDER SURGIFOAM 1G (HEMOSTASIS) ×6 IMPLANT
KIT BASIN OR (CUSTOM PROCEDURE TRAY) ×3 IMPLANT
KIT ROOM TURNOVER OR (KITS) ×3 IMPLANT
KIT SUCTION CATH 14FR (SUCTIONS) IMPLANT
MARKER SKIN DUAL TIP RULER LAB (MISCELLANEOUS) IMPLANT
NS IRRIG 1000ML POUR BTL (IV SOLUTION) ×3 IMPLANT
PACK CHEST (CUSTOM PROCEDURE TRAY) ×3 IMPLANT
PAD ARMBOARD 7.5X6 YLW CONV (MISCELLANEOUS) ×6 IMPLANT
PIN SAFETY STERILE (MISCELLANEOUS) IMPLANT
RUBBERBAND STERILE (MISCELLANEOUS) IMPLANT
SET HNDPC FAN SPRY TIP SCT (DISPOSABLE) IMPLANT
SOLUTION BETADINE 4OZ (MISCELLANEOUS) IMPLANT
SPONGE GAUZE 4X4 12PLY (GAUZE/BANDAGES/DRESSINGS) ×3 IMPLANT
SPONGE LAP 18X18 X RAY DECT (DISPOSABLE) ×3 IMPLANT
STRAP MONTGOMERY 1.25X11-1/8 (MISCELLANEOUS) IMPLANT
SUT STEEL 6MS V (SUTURE) IMPLANT
SUT STEEL STERNAL CCS#1 18IN (SUTURE) IMPLANT
SUT STEEL SZ 6 DBL 3X14 BALL (SUTURE) IMPLANT
SUT VIC AB 1 CTX 36 (SUTURE) ×2
SUT VIC AB 1 CTX36XBRD ANBCTR (SUTURE) ×1 IMPLANT
SUT VIC AB 2-0 CTX 36 (SUTURE) ×3 IMPLANT
SUT VIC AB 3-0 SH 27 (SUTURE) ×12
SUT VIC AB 3-0 SH 27X BRD (SUTURE) ×4 IMPLANT
SUT VIC AB 3-0 X1 27 (SUTURE) ×6 IMPLANT
SWAB COLLECTION DEVICE MRSA (MISCELLANEOUS) IMPLANT
SYSTEM SAHARA CHEST DRAIN ATS (WOUND CARE) ×3 IMPLANT
TAPE CLOTH SURG 4X10 WHT LF (GAUZE/BANDAGES/DRESSINGS) ×3 IMPLANT
TOWEL OR 17X24 6PK STRL BLUE (TOWEL DISPOSABLE) ×3 IMPLANT
TOWEL OR 17X26 10 PK STRL BLUE (TOWEL DISPOSABLE) ×3 IMPLANT
TRAY FOLEY CATH 14FRSI W/METER (CATHETERS) ×3 IMPLANT
TUBE ANAEROBIC SPECIMEN COL (MISCELLANEOUS) IMPLANT
WATER STERILE IRR 1000ML POUR (IV SOLUTION) ×3 IMPLANT

## 2013-05-01 NOTE — Discharge Instructions (Signed)
Resume normal activity.  You may shower. The incisions are covered with Dermabond surgical adhesive that is waterproof. Do not apply any creams or lotions to the incision.

## 2013-05-01 NOTE — H&P (Signed)
[address]       [place] [address]             [phone]      Cardiothoracic Surgery History and Physical   PCP is [name], MD Referring Provider is [name], MD    Chief Complaint   Patient presents with   .  Routine Post Op       continues with sternal pain s/p AVR 08/17/11...cxr        HPI: The patient is a 80 year old gentleman who underwent AVR with a bioprosthetic valve by me on 08/17/2011. His postop course was uncomplicated. He also has a hx of PAF and has been maintained on Pradaxa. He was recently seen by Dr. Burt Knack and was complaining of mid-sternal chest pain that he says started a few months after surgery and has persisted. It wakes him up at night and has been made worse with coughing and certain positions such a lying down.    Past Medical History   Diagnosis  Date   .  GERD with stricture         w/hx of stricture   .  PVD (peripheral vascular disease)  12/07       per cath 12/07....iliac   .  Carotid artery occlusion         last u/s 3-11...Marland Kitchenper vasc.surgery 2007 cath: nonobstructive CAD. mil;d MS, trivial AoS, small AAA,   .  Bowen's disease         right arm BX: referred to dermatology   .  Hyperlipidemia     .  Emphysema         PFT 4/10 FVC 3.45 (90%), FEV1 2.72 (106%), TLC 5.36 (93%), DLCO 74%, NO BD RESPONSE: ? OF PSEUDONORMALIZATION OF PFT FINDINGS W/BOTH OBSTRUCTIVE AND RESTRICTIVE DEFECTS.   Marland Kitchen  AAA (abdominal aortic aneurysm)  05/2008       PER CARDIAC CATH   .  Aortic stenosis  05/2008       PER CARDIAC CATH, MILD, MITRAL STENOSIS   .  Mitral stenosis  05/2008       PER CARDIAC CATH   .  Coronary artery disease  05/2008       MILD, MEDICAL MANAGEMENT   .  Pulmonary hypertension     .  Hypertension  02/21/11       pt denies this hx   .  Dyspnea          w/u included a cath and PFT's, sxms thought to be pulmonary   .  Chronic back pain     .  Peripheral neuropathy         right thigh; "it's been that way  for years"   .  Diabetes mellitus  09/07/2011   .  Benign neoplasm of colon     .  Stricture and stenosis of esophagus     .  COPD (chronic obstructive pulmonary disease)           Past Surgical History   Procedure  Laterality  Date   .  Appendectomy       .  Esophageal dilation       .  Cardiac catheterization    02/2011       LAD 50, CFX OK, RCA40, EF 55%; PCWP 17, AoV  0.9 cm squared   .  Endarterectomy    08-2010       Right carotid endarterectomy     .  Aortic valve replacement    08/17/2011       Procedure: AORTIC VALVE REPLACEMENT (AVR);  Surgeon: Gaye Pollack, MD;  Location: [facility];  Service: Open Heart Surgery;  Laterality: N/A;         Family History   Problem  Relation  Age of Onset   .  Heart attack  Father  32       MI   .  Diabetes  Neg Hx     .  Colon cancer  Neg Hx     .  Prostate cancer  Neg Hx     .  Anesthesia problems  Neg Hx     .  Hypotension  Neg Hx     .  Malignant hyperthermia  Neg Hx     .  Pseudochol deficiency  Neg Hx     .  Stomach cancer  Neg Hx          Social History History   Substance Use Topics   .  Smoking status:  Former Smoker -- 2.00 packs/day for 30 years       Types:  Cigarettes       Quit date:  04/02/1986   .  Smokeless tobacco:  Never Used         Comment: quit smoking cigarettes 04/1987   .  Alcohol Use:  No         Current Outpatient Prescriptions   Medication  Sig  Dispense  Refill   .  apixaban (ELIQUIS) 5 MG TABS tablet  Take 1 tablet (5 mg total) by mouth 2 (two) times daily.   60 tablet   6   .  meclizine (ANTIVERT) 25 MG tablet  Take 25 mg by mouth daily as needed for dizziness.          Marland Kitchen  omeprazole (PRILOSEC OTC) 20 MG tablet  Take 2 tablets (40 mg total) by mouth 2 (two) times daily.   120 tablet   0   .  acetaminophen (TYLENOL) 500 MG tablet  Take 1,000 mg by mouth daily as needed (pain).         .  furosemide (LASIX) 40 MG tablet  Take 40 mg by mouth daily.         .  potassium chloride SA  (K-DUR,KLOR-CON) 20 MEQ tablet  Take 20 mEq by mouth daily.         .  pravastatin (PRAVACHOL) 40 MG tablet  Take 40 mg by mouth at bedtime.         .  traMADol (ULTRAM) 50 MG tablet  Take 50 mg by mouth every 4 (four) hours as needed (pain).             No current facility-administered medications for this visit.        No Known Allergies   Review of Systems  Constitutional: Negative.   HENT: Negative.   Eyes: Negative.   Respiratory: Negative.   Cardiovascular: Positive for chest pain. Negative for palpitations and leg swelling.  Gastrointestinal: Negative.   Endocrine: Negative.   Genitourinary: Negative.   Musculoskeletal: Negative.   Skin: Negative.   Allergic/Immunologic: Negative.   Neurological: Negative.   Hematological: Negative.   Psychiatric/Behavioral: Positive for suicidal ideas.      BP 152/73  Pulse 109  Resp 18  Ht 5\' 7"  (1.702 m)  Wt 219 lb (99.338 kg)  BMI 34.29 kg/m2  SpO2 97% Physical Exam  Constitutional: He is oriented  to person, place, and time. He appears well-developed and well-nourished. No distress.  HENT:   Head: Normocephalic and atraumatic.   Mouth/Throat: Oropharynx is clear and moist.  Eyes: EOM are normal. Pupils are equal, round, and reactive to light.  Neck: Normal range of motion. Neck supple. No JVD present. No tracheal deviation present. No thyromegaly present.  Cardiovascular: Normal rate, regular rhythm, normal heart sounds and intact distal pulses.    No murmur heard. Pulmonary/Chest: Effort normal and breath sounds normal. No stridor. No respiratory distress. He has no rales. He exhibits tenderness.  Tender over the middle of sternum along the midline. There is no movement of the sternum with coughing. No wires are sticking up. No erythema. Incision well-healed.  Abdominal: Soft. Bowel sounds are normal. He exhibits no distension and no mass. There is no tenderness.  Musculoskeletal: Normal range of motion. He exhibits no  edema and no tenderness.  Lymphadenopathy:    He has no cervical adenopathy.  Neurological: He is alert and oriented to person, place, and time. He has normal strength. No cranial nerve deficit or sensory deficit.  Skin: Skin is warm and dry.  Psychiatric: He has a normal mood and affect.        Diagnostic Tests:    CLINICAL DATA: Sternal pain. Heart surgery 2013   EXAM:   CHEST 2 VIEW   COMPARISON: 11/20/2012   FINDINGS:   Aortic valve replacement. Heart size and vascularity are normal.   Increased lung markings in the bases are stable and consistent with   fibrosis. Negative for pneumonia or effusion.   IMPRESSION:   Chronic lung disease in the bases. No superimposed acute   abnormality.   Electronically Signed   By: Franchot Gallo M.D.   On: 04/22/2013 15:18                Impression:   He has chronic pain over the midline of the sternum with point tenderness over the lower sternal wires. His sternum feels stable. It is possible that his pain is related to the sternal wires and I think removal of the lower wires is reasonable. I told the patient and his daughter that it may not resolve his pain but it is really the only thing to try at this point and I have seen this type of pain completely resolve before after wire removal. I discussed the surgical procedure, alternatives, benefits and risks with the patient and his daughter and he would like to proceed. He will stop his Pradaxa 5 days before and I will remove the wires in the OR under general anesthesia on Friday 05/01/2013.   Plan:   Sternal wire removal on 05/01/2013.

## 2013-05-01 NOTE — Transfer of Care (Signed)
Immediate Anesthesia Transfer of Care Note  Patient: Fernando Moyer  Procedure(s) Performed: Procedure(s): STERNAL WIRES REMOVAL (N/A)  Patient Location: PACU  Anesthesia Type:General  Level of Consciousness: awake, alert  and oriented  Airway & Oxygen Therapy: Patient Spontanous Breathing and Patient connected to nasal cannula oxygen  Post-op Assessment: Report given to PACU RN, Post -op Vital signs reviewed and stable and Patient moving all extremities X 4  Post vital signs: Reviewed and stable  Complications: No apparent anesthesia complications

## 2013-05-01 NOTE — Brief Op Note (Signed)
05/01/2013  8:39 AM  PATIENT:  Fernando Moyer  80 y.o. male  PRE-OPERATIVE DIAGNOSIS:  STERNAL PAIN  POST-OPERATIVE DIAGNOSIS:  STERNAL PAIN  PROCEDURE:  Procedure(s): STERNAL WIRES REMOVAL (N/A)  SURGEON:  Surgeon(s) and Role:    * Gaye Pollack, MD - Primary  PHYSICIAN ASSISTANT: none  ASSISTANTS: none   ANESTHESIA:   general  EBL:  Total I/O In: 700 [I.V.:700] Out: -   BLOOD ADMINISTERED:none  DRAINS: none   LOCAL MEDICATIONS USED:  NONE  SPECIMEN:  No Specimen  DISPOSITION OF SPECIMEN:  N/A  COUNTS:  YES  TOURNIQUET:  * No tourniquets in log *  DICTATION: .Note written in EPIC  PLAN OF CARE: Discharge to home after PACU  PATIENT DISPOSITION:  PACU - hemodynamically stable.   Delay start of Pharmacological VTE agent (>24hrs) due to surgical blood loss or risk of bleeding: not applicable

## 2013-05-01 NOTE — Anesthesia Postprocedure Evaluation (Signed)
Anesthesia Post Note  Patient: Fernando Moyer  Procedure(s) Performed: Procedure(s) (LRB): STERNAL WIRES REMOVAL (N/A)  Anesthesia type: General  Patient location: PACU  Post pain: Pain level controlled and Adequate analgesia  Post assessment: Post-op Vital signs reviewed, Patient's Cardiovascular Status Stable, Respiratory Function Stable, Patent Airway and Pain level controlled  Last Vitals:  Filed Vitals:   05/01/13 0844  BP: 172/68  Pulse: 90  Temp: 36.6 C  Resp:     Post vital signs: Reviewed and stable  Level of consciousness: awake, alert  and oriented  Complications: No apparent anesthesia complications

## 2013-05-01 NOTE — Interval H&P Note (Signed)
History and Physical Interval Note:  05/01/2013 7:32 AM  Fernando Moyer  has presented today for surgery, with the diagnosis of STERNAL PAIN  The various methods of treatment have been discussed with the patient and family. After consideration of risks, benefits and other options for treatment, the patient has consented to  Procedure(s): STERNAL WIRES REMOVAL (N/A) as a surgical intervention .  The patient's history has been reviewed, patient examined, no change in status, stable for surgery.  I have reviewed the patient's chart and labs.  Questions were answered to the patient's satisfaction.     Gaye Pollack

## 2013-05-01 NOTE — Progress Notes (Signed)
Cannot verify blood consent signed. Orders to transfuse or asettation  Order not in epic. Notified Dr.Bartle. He stated pt. Didn't need a type and screen.

## 2013-05-01 NOTE — Op Note (Signed)
CARDIOVASCULAR SURGERY OPERATIVE NOTE  05/01/2013 Fernando Moyer [id number]  Surgeon:  Gaye Pollack, MD  First Assistant: none  Preoperative Diagnosis:  Chronic chest discomfort, s/p median sternotomy for AVR.  Postoperative Diagnosis:  Same   Procedure:  1. Removal of sternal wires  Anesthesia:  General Endotracheal   Clinical History/Surgical Indication:  The patient is a 80 year old gentleman who underwent AVR with a bioprosthetic valve by me on 08/17/2011. His postop course was uncomplicated. He also has a hx of PAF and has been maintained on Pradaxa. He was recently seen by Dr. Burt Knack and was complaining of mid-sternal chest pain that he says started a few months after surgery and has persisted. It wakes him up at night and has been made worse with coughing and certain positions such a lying down. He has chronic pain over the midline of the sternum with point tenderness over the lower sternal wires. His sternum feels stable. It is possible that his pain is related to the sternal wires and I think removal of the lower wires is reasonable. I told the patient and his daughter that it may not resolve his pain but it is really the only thing to try at this point and I have seen this type of pain completely resolve before after wire removal. I discussed the surgical procedure, alternatives, benefits and risks with the patient and his daughter and he would like to proceed. He will stop his Pradaxa 5 days before and I will remove the wires in the OR under general anesthesia on Friday 05/01/2013.    Preparation:  The patient was seen in the preoperative holding area and the correct patient, correct operation were confirmed with the patient after reviewing the medical record. The consent was signed by me. Preoperative antibiotics were given. The patient was taken back to the operating room and positioned supine on the operating room table.The patient was placed under general  anesthesia  using an LMA by the anesthesia team. The neck, chest, and abdomen were prepped with betadine soap and solution and draped in the usual sterile manner. A surgical time-out was taken and the correct patient and operative procedure were confirmed with the nursing and anesthesia staff.  Removal of Sternal Wires:  A 1 cm incision was made over the lower sternum and the two lowermost sternal wires were located. They were untwisted and removed. Then a second 1 cm incision was made over the mid-portion of the sternum and the next two sternal wires were located, untwisted and removed. The three wires in the manubrium were left in place since he had no pain over this area.  The subcutaneous tissue was closed with 3-0 vicryl interrupted  suture. The skin was closed with 3-0 vicryl subcuticular suture. All sponge, needle, and instrument counts were reported correct at the end of the case.   The patient was awakened and transported to the PACU in stable condition.

## 2013-05-01 NOTE — Anesthesia Preprocedure Evaluation (Addendum)
Anesthesia Evaluation  Patient identified by MRN, date of birth, ID band Patient awake    Reviewed: Allergy & Precautions, H&P , NPO status , Patient's Chart, lab work & pertinent test results  Airway Mallampati: II TM Distance: >3 FB Neck ROM: full    Dental  (+) Dental Advisory Given and Missing   Pulmonary shortness of breath, COPDformer smoker,          Cardiovascular + CAD, + Past MI and + Peripheral Vascular Disease + Valvular Problems/Murmurs  S/p AVR   Neuro/Psych  Headaches,    GI/Hepatic GERD-  ,  Endo/Other  diabetes, Type 2  Renal/GU      Musculoskeletal   Abdominal   Peds  Hematology   Anesthesia Other Findings   Reproductive/Obstetrics                          Anesthesia Physical Anesthesia Plan  ASA: III  Anesthesia Plan: General   Post-op Pain Management:    Induction: Intravenous  Airway Management Planned: Oral ETT  Additional Equipment:   Intra-op Plan:   Post-operative Plan: Extubation in OR  Informed Consent: I have reviewed the patients History and Physical, chart, labs and discussed the procedure including the risks, benefits and alternatives for the proposed anesthesia with the patient or authorized representative who has indicated his/her understanding and acceptance.     Plan Discussed with: CRNA, Anesthesiologist and Surgeon  Anesthesia Plan Comments:         Anesthesia Quick Evaluation

## 2013-05-01 NOTE — Anesthesia Procedure Notes (Signed)
Procedure Name: LMA Insertion Date/Time: 05/01/2013 7:42 AM Performed by: Gaylene Brooks Pre-anesthesia Checklist: Patient identified, Timeout performed, Emergency Drugs available, Suction available and Patient being monitored Patient Re-evaluated:Patient Re-evaluated prior to inductionOxygen Delivery Method: Circle system utilized Preoxygenation: Pre-oxygenation with 100% oxygen Intubation Type: IV induction Ventilation: Mask ventilation without difficulty LMA: LMA inserted LMA Size: 5.0 Number of attempts: 1 Tube secured with: Tape Dental Injury: Teeth and Oropharynx as per pre-operative assessment

## 2013-05-04 ENCOUNTER — Encounter (HOSPITAL_COMMUNITY): Payer: Self-pay | Admitting: Surgery

## 2013-05-11 ENCOUNTER — Other Ambulatory Visit: Payer: Self-pay | Admitting: *Deleted

## 2013-05-11 DIAGNOSIS — R079 Chest pain, unspecified: Secondary | ICD-10-CM

## 2013-05-13 ENCOUNTER — Ambulatory Visit
Admission: RE | Admit: 2013-05-13 | Discharge: 2013-05-13 | Disposition: A | Discharge status: Home / Self Care | Payer: Medicare Other | Source: Ambulatory Visit | Attending: Surgery | Admitting: Surgery

## 2013-05-13 ENCOUNTER — Ambulatory Visit (INDEPENDENT_AMBULATORY_CARE_PROVIDER_SITE_OTHER): Payer: Self-pay | Admitting: Surgery

## 2013-05-13 ENCOUNTER — Encounter: Payer: Self-pay | Admitting: Surgery

## 2013-05-13 VITALS — BP 125/89 | HR 94 | Resp 20 | Ht 67.0 in | Wt 220.0 lb

## 2013-05-13 DIAGNOSIS — R0789 Other chest pain: Secondary | ICD-10-CM

## 2013-05-13 DIAGNOSIS — R079 Chest pain, unspecified: Secondary | ICD-10-CM

## 2013-05-13 NOTE — Progress Notes (Signed)
     HPI:  Patient returns for routine postoperative follow-up having undergone removal of the lower 4 sternal wires on 05/01/2013. He says that the chest pain that he had prior to surgery was immediately gone postop and has not returned. He says that he slept well for a few nights but for the last 3 nights he has had significant chest pain across both sides of the chest after laying down which is different than the pain he had before. He has been taking some Ultram with relief.   Current Outpatient Prescriptions  Medication Sig Dispense Refill  . acetaminophen (TYLENOL) 500 MG tablet Take 1,000 mg by mouth daily as needed (pain).      Marland Kitchen apixaban (ELIQUIS) 5 MG TABS tablet Take 1 tablet (5 mg total) by mouth 2 (two) times daily.  60 tablet  6  . furosemide (LASIX) 40 MG tablet Take 40 mg by mouth daily.      . meclizine (ANTIVERT) 25 MG tablet Take 25 mg by mouth daily as needed for dizziness.       Marland Kitchen omeprazole (PRILOSEC OTC) 20 MG tablet Take 2 tablets (40 mg total) by mouth 2 (two) times daily.  120 tablet  0  . potassium chloride SA (K-DUR,KLOR-CON) 20 MEQ tablet Take 20 mEq by mouth daily.      . pravastatin (PRAVACHOL) 40 MG tablet Take 40 mg by mouth at bedtime.      . traMADol (ULTRAM) 50 MG tablet Take 50 mg by mouth every 4 (four) hours as needed (pain).      . traMADol (ULTRAM) 50 MG tablet Take 1 tablet (50 mg total) by mouth every 6 (six) hours as needed for moderate pain.  30 tablet  0   No current facility-administered medications for this visit.    Physical Exam: BP 125/89  Pulse 94  Resp 20  Ht 5\' 7"  (1.702 m)  Wt 220 lb (99.791 kg)  BMI 34.45 kg/m2  SpO2 98% He looks comfortable The chest incisions are healing well. The sternum is stable. Cardiac exam shows a regular rate and rhythm. Lungs are clear  Diagnostic Tests: None  Impression:  It seems like his previous pain resolved with wire removal. I don't know what this new pain across his chest for the past 3  days is coming from. It has only occurred at night when lying down. I think it is probably best to give him a few more weeks of recovery to see if it resolves. It does not sound cardiac and his cath showed only mild coronary stenoses.  Plan:  He will return to see me in 3 weeks.

## 2013-05-15 ENCOUNTER — Other Ambulatory Visit: Payer: Self-pay | Admitting: *Deleted

## 2013-05-15 MED ORDER — APIXABAN 5 MG PO TABS: 5.0000 mg | ORAL_TABLET | Freq: Two times a day (BID) | ORAL | Status: DC

## 2013-05-27 ENCOUNTER — Telehealth: Payer: Self-pay

## 2013-05-27 NOTE — Telephone Encounter (Signed)
Left message for call back Non-identifiable   Flu-01/20/13 Td-01/17/11 PNA- 12/07/11 Z- CCS- 02/20/13- Adenomatous polyp- Dr. Rob Bunting 01/2014 PSA- 01/17/11- 3.68

## 2013-05-28 ENCOUNTER — Encounter: Payer: Medicare Other | Admitting: Internal Medicine

## 2013-06-01 ENCOUNTER — Telehealth: Payer: Self-pay

## 2013-06-01 ENCOUNTER — Encounter: Payer: Medicare Other | Admitting: Internal Medicine

## 2013-06-01 NOTE — Telephone Encounter (Signed)
Appt was cancelled due to emergency.

## 2013-06-01 NOTE — Telephone Encounter (Signed)
Left message for call back Non-identifiable   Flu- 01/20/13 Td-01/17/11 PNA- 12/07/11 Z-Due CCS- 02/20/13-adenomatous polyp, repeat 01/2014 PSA- 01/17/11- 3.68

## 2013-06-03 ENCOUNTER — Ambulatory Visit (INDEPENDENT_AMBULATORY_CARE_PROVIDER_SITE_OTHER): Payer: Self-pay | Admitting: Surgery

## 2013-06-03 ENCOUNTER — Encounter: Payer: Self-pay | Admitting: Surgery

## 2013-06-03 VITALS — BP 96/67 | HR 86 | Resp 20 | Ht 67.0 in | Wt 220.0 lb

## 2013-06-03 DIAGNOSIS — I350 Nonrheumatic aortic (valve) stenosis: Secondary | ICD-10-CM

## 2013-06-03 DIAGNOSIS — I359 Nonrheumatic aortic valve disorder, unspecified: Secondary | ICD-10-CM

## 2013-06-03 DIAGNOSIS — R072 Precordial pain: Secondary | ICD-10-CM

## 2013-06-03 DIAGNOSIS — Z09 Encounter for follow-up examination after completed treatment for conditions other than malignant neoplasm: Secondary | ICD-10-CM

## 2013-06-03 DIAGNOSIS — R0789 Other chest pain: Secondary | ICD-10-CM

## 2013-06-03 DIAGNOSIS — Z952 Presence of prosthetic heart valve: Secondary | ICD-10-CM

## 2013-06-03 DIAGNOSIS — Z954 Presence of other heart-valve replacement: Secondary | ICD-10-CM

## 2013-06-03 NOTE — Progress Notes (Signed)
     HPI:  Patient returns for routine postoperative follow-up having undergone removal of the lower 4 sternal wires on 05/01/2013. He says that the severe pain that he was having preop has resolved. When I saw him 05/13/2013 for a postop check he said he was having a new pain across his chest but that has also resolved. His granddaughter is with him today and says that he has been doing well.   Current Outpatient Prescriptions  Medication Sig Dispense Refill  . acetaminophen (TYLENOL) 500 MG tablet Take 1,000 mg by mouth daily as needed (pain).      Marland Kitchen apixaban (ELIQUIS) 5 MG TABS tablet Take 1 tablet (5 mg total) by mouth 2 (two) times daily.  60 tablet  0  . furosemide (LASIX) 40 MG tablet Take 40 mg by mouth daily.      . meclizine (ANTIVERT) 25 MG tablet Take 25 mg by mouth daily as needed for dizziness.       Marland Kitchen omeprazole (PRILOSEC OTC) 20 MG tablet Take 2 tablets (40 mg total) by mouth 2 (two) times daily.  120 tablet  0  . potassium chloride SA (K-DUR,KLOR-CON) 20 MEQ tablet Take 20 mEq by mouth daily.      . pravastatin (PRAVACHOL) 40 MG tablet Take 40 mg by mouth at bedtime.      . traMADol (ULTRAM) 50 MG tablet Take 50 mg by mouth every 4 (four) hours as needed (pain).       No current facility-administered medications for this visit.     Physical Exam: BP 96/67  Pulse 86  Resp 20  Ht 5\' 7"  (1.702 m)  Wt 220 lb (99.791 kg)  BMI 34.45 kg/m2  SpO2 97% He looks well The incisions are well-healed. One of the suture knots is protruding through the incision and was removed. The sternum is stable.  Diagnostic Tests: none  Impression:  His chest pain has resolved with removal of the sternal wires. No further treatment is needed.  Plan:  He will continue to follow-up with his primary MD and Dr. Burt Knack for his cardiology care.

## 2013-06-03 NOTE — Telephone Encounter (Signed)
Appointment was cancelled.

## 2013-06-13 ENCOUNTER — Other Ambulatory Visit: Payer: Self-pay | Admitting: Internal Medicine

## 2013-06-17 ENCOUNTER — Other Ambulatory Visit: Payer: Self-pay | Admitting: *Deleted

## 2013-06-17 DIAGNOSIS — E785 Hyperlipidemia, unspecified: Secondary | ICD-10-CM

## 2013-06-17 MED ORDER — APIXABAN 5 MG PO TABS: 5.0000 mg | ORAL_TABLET | Freq: Two times a day (BID) | ORAL | Status: DC

## 2013-06-17 MED ORDER — PRAVASTATIN SODIUM 40 MG PO TABS: ORAL_TABLET | ORAL | Status: DC

## 2013-06-29 ENCOUNTER — Other Ambulatory Visit: Payer: Self-pay | Admitting: Cardiovascular Disease

## 2013-06-29 ENCOUNTER — Encounter: Payer: Self-pay | Admitting: Internal Medicine

## 2013-06-29 ENCOUNTER — Ambulatory Visit (INDEPENDENT_AMBULATORY_CARE_PROVIDER_SITE_OTHER): Payer: Medicare Other | Admitting: Internal Medicine

## 2013-06-29 VITALS — BP 143/75 | HR 100 | Temp 98.0°F | Ht 67.0 in | Wt 222.0 lb

## 2013-06-29 DIAGNOSIS — N400 Enlarged prostate without lower urinary tract symptoms: Secondary | ICD-10-CM | POA: Diagnosis not present

## 2013-06-29 DIAGNOSIS — I480 Paroxysmal atrial fibrillation: Secondary | ICD-10-CM

## 2013-06-29 DIAGNOSIS — J438 Other emphysema: Secondary | ICD-10-CM

## 2013-06-29 DIAGNOSIS — I4891 Unspecified atrial fibrillation: Secondary | ICD-10-CM

## 2013-06-29 DIAGNOSIS — E119 Type 2 diabetes mellitus without complications: Secondary | ICD-10-CM | POA: Diagnosis not present

## 2013-06-29 DIAGNOSIS — E785 Hyperlipidemia, unspecified: Secondary | ICD-10-CM

## 2013-06-29 DIAGNOSIS — I2510 Atherosclerotic heart disease of native coronary artery without angina pectoris: Secondary | ICD-10-CM | POA: Diagnosis not present

## 2013-06-29 DIAGNOSIS — Z0000 Encounter for general adult medical examination without abnormal findings: Secondary | ICD-10-CM | POA: Diagnosis not present

## 2013-06-29 MED ORDER — ZOSTER VACCINE LIVE 19400 UNT/0.65ML ~~LOC~~ SOLR: 0.6500 mL | Freq: Once | SUBCUTANEOUS | Status: DC

## 2013-06-29 NOTE — Patient Instructions (Signed)
Please schedule blood work fasting: A1c ---diabetes FLP--- hyperlipidemia TSH, free T3, free T4 --- dx abnormal TSH  Please schedule a visit with the eye doctor. If you like to see a specialist  about your hearing problem === let me know  Check the  blood pressure 2 or 3 times a  week be sure it is between 110/60 and 140/85. Ideal blood pressure is 120/80. If it is consistently higher or lower, let me know Also, checked your  pulse, if it is more than 90 per minute let me or your heart doctor know   Next visit in 3-4 months, fasting.    Moles Moles are usually harmless growths on the skin. They are accumulations of color (pigment) cells in the skin that:   Can be various colors, from light brown to black.  Can appear anywhere on the body.  May remain flat or become raised.  May contain hairs.  May remain smooth or develop wrinkling. Most moles are not cancerous (benign). However, some moles may develop changes and become cancerous. It is important to check your moles every month. If you check your moles regularly, you will be able to notice any changes that may occur.  CAUSES  Moles occur when skin cells grow together in clusters instead of spreading out in the skin as they normally do. The reason for this clustering is unknown. DIAGNOSIS  Your caregiver will perform a skin examination to diagnose your mole.  TREATMENT  Moles usually do not require treatment. If a mole becomes worrisome, your caregiver may choose to take a sample of the mole or remove it entirely, and then send it to a lab for examination.  HOME CARE INSTRUCTIONS  Check your mole(s) monthly for changes that may indicate skin cancer. These changes can include:  A change in size.  A change in color. Note that moles tend to darken during pregnancy or when taking birth control pills (oral contraception).  A change in shape.  A change in the border of the mole.  Wear sunscreen (with an SPF of at least 27)  when you spend long periods of time outside. Reapply the sunscreen every 2 3 hours.  Schedule annual appointments with your skin doctor (dermatologist) if you have a large number of moles. SEEK MEDICAL CARE IF:  Your mole changes size, especially if it becomes larger than a pencil eraser.  Your mole changes in color or develops more than one color.  Your mole becomes itchy or bleeds.  Your mole, or the skin near the mole, becomes painful, sore, red, or swollen.  Your mole becomes scaly, sheds skin, or oozes fluid.  Your mole develops irregular borders.  Your mole becomes flat or develops raised areas.  Your mole becomes hard or soft. Document Released: 12/12/2000 Document Revised: 12/12/2011 Document Reviewed: 10/01/2011 [facility] Patient Information 2014 [place].    Fall Prevention and Home Safety Falls cause injuries and can affect all age groups. It is possible to use preventive measures to significantly decrease the likelihood of falls. There are many simple measures which can make your home safer and prevent falls. OUTDOORS  Repair cracks and edges of walkways and driveways.  Remove high doorway thresholds.  Trim shrubbery on the main path into your home.  Have good outside lighting.  Clear walkways of tools, rocks, debris, and clutter.  Check that handrails are not broken and are securely fastened. Both sides of steps should have handrails.  Have leaves, snow, and ice cleared regularly.  Use sand or salt on walkways during winter months.  In the garage, clean up grease or oil spills. BATHROOM  Install night lights.  Install grab bars by the toilet and in the tub and shower.  Use non-skid mats or decals in the tub or shower.  Place a plastic non-slip stool in the shower to sit on, if needed.  Keep floors dry and clean up all water on the floor immediately.  Remove soap buildup in the tub or shower on a regular basis.  Secure bath mats with  non-slip, double-sided rug tape.  Remove throw rugs and tripping hazards from the floors. BEDROOMS  Install night lights.  Make sure a bedside light is easy to reach.  Do not use oversized bedding.  Keep a telephone by your bedside.  Have a firm chair with side arms to use for getting dressed.  Remove throw rugs and tripping hazards from the floor. KITCHEN  Keep handles on pots and pans turned toward the center of the stove. Use back burners when possible.  Clean up spills quickly and allow time for drying.  Avoid walking on wet floors.  Avoid hot utensils and knives.  Position shelves so they are not too high or low.  Place commonly used objects within easy reach.  If necessary, use a sturdy step stool with a grab bar when reaching.  Keep electrical cables out of the way.  Do not use floor polish or wax that makes floors slippery. If you must use wax, use non-skid floor wax.  Remove throw rugs and tripping hazards from the floor. STAIRWAYS  Never leave objects on stairs.  Place handrails on both sides of stairways and use them. Fix any loose handrails. Make sure handrails on both sides of the stairways are as long as the stairs.  Check carpeting to make sure it is firmly attached along stairs. Make repairs to worn or loose carpet promptly.  Avoid placing throw rugs at the top or bottom of stairways, or properly secure the rug with carpet tape to prevent slippage. Get rid of throw rugs, if possible.  Have an electrician put in a light switch at the top and bottom of the stairs. OTHER FALL PREVENTION TIPS  Wear low-heel or rubber-soled shoes that are supportive and fit well. Wear closed toe shoes.  When using a stepladder, make sure it is fully opened and both spreaders are firmly locked. Do not climb a closed stepladder.  Add color or contrast paint or tape to grab bars and handrails in your home. Place contrasting color strips on first and last steps.  Learn  and use mobility aids as needed. Install an electrical emergency response system.  Turn on lights to avoid dark areas. Replace light bulbs that burn out immediately. Get light switches that glow.  Arrange furniture to create clear pathways. Keep furniture in the same place.  Firmly attach carpet with non-skid or double-sided tape.  Eliminate uneven floor surfaces.  Select a carpet pattern that does not visually hide the edge of steps.  Be aware of all pets. OTHER HOME SAFETY TIPS  Set the water temperature for 120 F (48.8 C).  Keep emergency numbers on or near the telephone.  Keep smoke detectors on every level of the home and near sleeping areas. Document Released: 03/09/2002 Document Revised: 09/18/2011 Document Reviewed: 06/08/2011 [facility] Patient Information 2014 [place].

## 2013-06-29 NOTE — Progress Notes (Signed)
Pre visit review using our clinic review tool, if applicable. No additional management support is needed unless otherwise documented below in the visit note. 

## 2013-06-29 NOTE — Assessment & Plan Note (Signed)
Not at goal per last FLP, labs

## 2013-06-29 NOTE — Assessment & Plan Note (Signed)
Due for labs, diet discussed, encouraged to see the eye doctor

## 2013-06-29 NOTE — Assessment & Plan Note (Signed)
Seems to be doing really well, recommend prevnar

## 2013-06-29 NOTE — Assessment & Plan Note (Addendum)
Td 2012 UTD on pneumonia shot 23, Prevnar recommended  Zostavax prescription provided in the past and today Colon ca screening, s/p 2 colonoscopies before (remotely), no reports but path reports from 2002 showed tubular adenomas CCS- 02/20/13-adenomatous polyp, repeat 01/2014  Prostate cancer screening not indicated, has seen urology before due  to BPH Diet and exercise discussed All recent labs reviewed

## 2013-06-29 NOTE — Assessment & Plan Note (Signed)
On oral anticoagulants, rate well controlled ? Recommend to check ambulatory BPs and pulse, see instructions

## 2013-06-29 NOTE — Progress Notes (Signed)
Subjective:    Patient ID: Fernando Moyer, male    DOB: 1934/02/10, 80 y.o.   MRN: [id number]  DOS:  06/29/2013 Type of  visit:  Here w/ his G-daughter.  Here for Medicare AWV: 1. Risk factors based on Past M, S, F history: reviewed 2. 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 Depression/mood: neg sreen   4. Hearing: chronic decreased hearing and B tinnitus, s/p multiple ENT evals,unchanged; declined another eval 5. ADL's:  Still drives, independent   6. Fall Risk: no recent falls, see instructions 7. home Safety: does feel safe at home   8. Height, weight, &visual acuity: see VS, vision ok but needs a ROV, encouraged to set that up 9. Counseling: provided 10. Labs ordered based on risk factors: if needed   11. Referral Coordination: if needed 12. Care Plan, see assessment and plan   13.  Cognitive Assessment: Motor skills and cognition seemed intact  In addition, today we discussed the following: CP-- s/p   removal of a wire from open heart surgery, pain better  Hyperlipidemia, good medication compliance Diabetes, on diet control only, needs to improve his diet. No ambulatory CBGs COPD, on no medications, doing well from the respiratory standpoint Cardiovascular heart disease, followup by cardiology, vascular surgery. History of bowen's disease, has not seen dermatology in a while.  ROS DOE- SOB at baseline ; No palpitations, no lower extremity edema No orthopnea , DOE Denies  nausea, vomiting diarrhea Denies  blood in the stools (-) cough, sputum production (-) wheezing, chest congestion No dysuria, gross hematuria, difficulty urinating        Past Medical History  Diagnosis Date  . GERD with stricture     w/hx of stricture  . PVD (peripheral vascular disease) 12/07    per cath 12/07....iliac  . Carotid artery occlusion     last u/s 3-11...Marland Kitchenper vasc.surgery 2007 cath: nonobstructive CAD. mil;d MS, trivial AoS, small AAA,  .  Bowen's disease     right arm BX: referred to dermatology  . Hyperlipidemia     takes Pravastatin daily  . AAA (abdominal aortic aneurysm) 05/2008    PER CARDIAC CATH  . Aortic stenosis 05/2008    PER CARDIAC CATH, MILD, MITRAL STENOSIS  . Mitral stenosis 05/2008    PER CARDIAC CATH  . Coronary artery disease 05/2008    MILD, MEDICAL MANAGEMENT  . Pulmonary hypertension   . Dyspnea      w/u included a cath and PFT's, sxms thought to be pulmonary  . Chronic back pain   . Peripheral neuropathy     right thigh; "it's been that way for years"  . Diabetes mellitus 09/07/2011  . Benign neoplasm of colon   . Stricture and stenosis of esophagus   . Myocardial infarction 2012  . COPD (chronic obstructive pulmonary disease)   . Dizziness     takes Meclizine daily  . Back pain     cause unknown but does get Cortisone injection every now and then    Past Surgical History  Procedure Laterality Date  . Appendectomy    . Esophageal dilation    . Cardiac catheterization  02/2011    LAD 50, CFX OK, RCA40, EF 55%; PCWP 17, AoV  0.9 cm squared  . Endarterectomy  08-2010    Right carotid endarterectomy    . Aortic valve replacement  08/17/2011    Procedure: AORTIC VALVE REPLACEMENT (AVR);  Surgeon: Gaspar Bidding  Alveria Apley, MD;  Location: [facility];  Service: Open Heart Surgery;  Laterality: N/A;  . Colonoscopy    . Sternal wires removal N/A 05/01/2013    Procedure: STERNAL WIRES REMOVAL;  Surgeon: Gaye Pollack, MD;  Location: [facility] OR;  Service: Thoracic;  Laterality: N/A;    History   Social History  . Marital Status: Widowed    Spouse Name: N/A    Number of Children: 2  . Years of Education: N/A   Occupational History  . 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 Smoking status: Former Smoker -- 2.00 packs/day for 30 years    Types: Cigarettes  . Smokeless tobacco: Never Used     Comment: quit smoking in 1989  .  Alcohol Use: No  . Drug Use: No  . Sexual Activity: No   Other Topics Concern  . Not on file   Social History Narrative   LIVES BY HIMSELF    HAS 2 GROWN SONS, 5 G-KIDS (Melissa usually comes with him for his appointments)               Medication List       This list is accurate as of: 06/29/13  6:38 PM.  Always use your most recent med list.               acetaminophen 500 MG tablet  Commonly known as:  TYLENOL  Take 1,000 mg by mouth daily as needed (pain).     apixaban 5 MG Tabs tablet  Commonly known as:  ELIQUIS  Take 1 tablet (5 mg total) by mouth 2 (two) times daily.     furosemide 40 MG tablet  Commonly known as:  LASIX  TAKE 1 TABLET DAILY     KLOR-CON M20 20 MEQ tablet  Generic drug:  potassium chloride SA  TAKE 1 TABLET DAILY     meclizine 25 MG tablet  Commonly known as:  ANTIVERT  Take 25 mg by mouth daily as needed for dizziness.     omeprazole 20 MG tablet  Commonly known as:  PRILOSEC OTC  Take 2 tablets (40 mg total) by mouth 2 (two) times daily.     pravastatin 40 MG tablet  Commonly known as:  PRAVACHOL  TAKE 1 TABLET DAILY     traMADol 50 MG tablet  Commonly known as:  ULTRAM  Take 50 mg by mouth every 4 (four) hours as needed (pain).     zoster vaccine live (PF) 19400 UNT/0.65ML injection  Commonly known as:  ZOSTAVAX  Inject 19,400 Units into the skin once.           Objective:   Physical Exam BP 143/75  Pulse 100  Temp(Src) 98 F (36.7 C)  Ht 5\' 7"  (1.702 m)  Wt 222 lb (100.699 kg)  BMI 34.76 kg/m2  SpO2 94% General -- alert, well-developed, NAD.  Neck --no thyromegaly  HEENT-- Not pale.  Lungs -- normal respiratory effort, no intercostal retractions, no accessory muscle use, and slt decreased breath sounds.  Heart-- normal rate, regular rhythm, + syst murmur.  Abdomen-- Not distended, good bowel sounds,soft, non-tender.  Extremities-- no pretibial edema bilaterally  Neurologic--  alert & oriented X3. Speech  normal, gait normal, strength normal in all extremities.    Psych-- Cognition and judgment appear intact. Cooperative with normal attention span and concentration. No anxious  or depressed appearing.         Assessment & Plan:

## 2013-06-30 ENCOUNTER — Other Ambulatory Visit: Payer: Self-pay

## 2013-06-30 MED ORDER — POTASSIUM CHLORIDE CRYS ER 20 MEQ PO TBCR: EXTENDED_RELEASE_TABLET | ORAL | Status: DC

## 2013-06-30 MED ORDER — FUROSEMIDE 40 MG PO TABS: ORAL_TABLET | ORAL | Status: DC

## 2013-07-03 ENCOUNTER — Other Ambulatory Visit: Payer: Medicare Other

## 2013-07-06 ENCOUNTER — Other Ambulatory Visit (INDEPENDENT_AMBULATORY_CARE_PROVIDER_SITE_OTHER): Payer: Medicare Other

## 2013-07-06 DIAGNOSIS — E785 Hyperlipidemia, unspecified: Secondary | ICD-10-CM | POA: Diagnosis not present

## 2013-07-06 DIAGNOSIS — R946 Abnormal results of thyroid function studies: Secondary | ICD-10-CM | POA: Diagnosis not present

## 2013-07-06 DIAGNOSIS — E119 Type 2 diabetes mellitus without complications: Secondary | ICD-10-CM

## 2013-07-06 DIAGNOSIS — R7989 Other specified abnormal findings of blood chemistry: Secondary | ICD-10-CM

## 2013-07-06 LAB — T4, FREE: Free T4: 0.69 ng/dL (ref 0.60–1.60)

## 2013-07-06 LAB — LIPID PANEL
Cholesterol: 139 mg/dL (ref 0–200)
HDL: 31 mg/dL — AB (ref >39.00)
LDL CALC: 64 mg/dL (ref 0–99)
TRIGLYCERIDES: 219 mg/dL — AB (ref 0.0–149.0)
Total CHOL/HDL Ratio: 4
VLDL: 43.8 mg/dL — ABNORMAL HIGH (ref 0.0–40.0)

## 2013-07-06 LAB — HEMOGLOBIN A1C: Hgb A1c MFr Bld: 6.8 % — ABNORMAL HIGH (ref 4.6–6.5)

## 2013-07-06 LAB — T3, FREE: T3, Free: 3.1 pg/mL (ref 2.3–4.2)

## 2013-07-06 LAB — TSH: TSH: 1.72 u[IU]/mL (ref 0.35–5.50)

## 2013-07-09 ENCOUNTER — Telehealth: Payer: Self-pay

## 2013-07-09 NOTE — Telephone Encounter (Signed)
Relevant patient education assigned to patient using Emmi. ° °

## 2013-08-17 ENCOUNTER — Telehealth: Payer: Self-pay | Admitting: Internal Medicine

## 2013-08-17 NOTE — Telephone Encounter (Signed)
Advised per recommendations.

## 2013-08-17 NOTE — Telephone Encounter (Signed)
Caller name: Melissa Relation to pt: grand daughter Call back number: [phone]   Reason for call:   Pt is needing an eye doctor and grand daughter wants to know if there is any recommendations that Dr. Larose Kells could offer.  Please advise.

## 2013-08-17 NOTE — Telephone Encounter (Signed)
Per Dr Paz--Dr Bing Plume or his associate, not PA

## 2013-08-26 DIAGNOSIS — M545 Low back pain, unspecified: Secondary | ICD-10-CM | POA: Diagnosis not present

## 2013-09-07 ENCOUNTER — Other Ambulatory Visit: Payer: Self-pay | Admitting: Internal Medicine

## 2013-09-22 DIAGNOSIS — H5210 Myopia, unspecified eye: Secondary | ICD-10-CM | POA: Diagnosis not present

## 2013-09-22 DIAGNOSIS — H2510 Age-related nuclear cataract, unspecified eye: Secondary | ICD-10-CM | POA: Diagnosis not present

## 2013-09-25 DIAGNOSIS — H2510 Age-related nuclear cataract, unspecified eye: Secondary | ICD-10-CM | POA: Diagnosis not present

## 2013-09-29 ENCOUNTER — Ambulatory Visit (INDEPENDENT_AMBULATORY_CARE_PROVIDER_SITE_OTHER): Payer: Medicare Other | Admitting: Internal Medicine

## 2013-09-29 ENCOUNTER — Encounter: Payer: Self-pay | Admitting: Internal Medicine

## 2013-09-29 VITALS — BP 117/72 | HR 90 | Temp 97.8°F | Wt 216.0 lb

## 2013-09-29 DIAGNOSIS — I2510 Atherosclerotic heart disease of native coronary artery without angina pectoris: Secondary | ICD-10-CM | POA: Diagnosis not present

## 2013-09-29 DIAGNOSIS — I4892 Unspecified atrial flutter: Secondary | ICD-10-CM

## 2013-09-29 DIAGNOSIS — L989 Disorder of the skin and subcutaneous tissue, unspecified: Secondary | ICD-10-CM

## 2013-09-29 DIAGNOSIS — I714 Abdominal aortic aneurysm, without rupture, unspecified: Secondary | ICD-10-CM

## 2013-09-29 DIAGNOSIS — C4490 Unspecified malignant neoplasm of skin, unspecified: Secondary | ICD-10-CM | POA: Insufficient documentation

## 2013-09-29 LAB — BASIC METABOLIC PANEL
BUN: 11 mg/dL (ref 6–23)
CALCIUM: 9.3 mg/dL (ref 8.4–10.5)
CHLORIDE: 106 meq/L (ref 96–112)
CO2: 26 meq/L (ref 19–32)
Creatinine, Ser: 0.9 mg/dL (ref 0.4–1.5)
GFR: 88.96 mL/min (ref >60.00)
GLUCOSE: 122 mg/dL — AB (ref 70–99)
Potassium: 4.1 mEq/L (ref 3.5–5.1)
Sodium: 139 mEq/L (ref 135–145)

## 2013-09-29 NOTE — Assessment & Plan Note (Addendum)
Nasal  skin lesions for 1-2 months, suspect cancer, refer to derm. BCC?

## 2013-09-29 NOTE — Progress Notes (Signed)
Pre visit review using our clinic review tool, if applicable. No additional management support is needed unless otherwise documented below in the visit note. 

## 2013-09-29 NOTE — Assessment & Plan Note (Addendum)
Good rate today, no amb VS

## 2013-09-29 NOTE — Patient Instructions (Signed)
Get your blood work before you leave   Next visit is for routine check up regards your blood sugar , blood pressure in 4 months  No need to come back fasting Please make an appointment

## 2013-09-29 NOTE — Progress Notes (Signed)
Subjective:    Patient ID: Fernando Moyer, male    DOB: [birth year], 80 y.o.   MRN: [id number]  DOS:  09/29/2013 Type of  Visit: Routine  History: In general feeling well, no new concerns. Diabetes, chart reviewed, well-controlled. High cholesterol, chart reviewed, well-controlled PAF, pulse (heart rate)  is satisfactory today, no ambulatory pulses I noted a skin lesion at the nose, the patient reports has been there for one or 2 months.    ROS No chest pain, shortness of breath at baseline. Denies nausea, vomiting, diarrhea. Occasionally has dysphagia and odynophagia which are at baseline  Past Medical History  Diagnosis Date  . GERD with stricture     w/hx of stricture  . PVD (peripheral vascular disease) 12/07    per cath 12/07....iliac  . Carotid artery occlusion     last u/s 3-11...Marland Kitchenper vasc.surgery 2007 cath: nonobstructive CAD. mil;d MS, trivial AoS, small AAA,  . Bowen's disease     right arm BX: referred to dermatology  . Hyperlipidemia     takes Pravastatin daily  . AAA (abdominal aortic aneurysm) 05/2008    PER CARDIAC CATH  . Aortic stenosis 05/2008    PER CARDIAC CATH, MILD, MITRAL STENOSIS  . Mitral stenosis 05/2008    PER CARDIAC CATH  . Coronary artery disease 05/2008    MILD, MEDICAL MANAGEMENT  . Pulmonary hypertension   . Dyspnea      w/u included a cath and PFT's, sxms thought to be pulmonary  . Chronic back pain   . Peripheral neuropathy     right thigh; "it's been that way for years"  . Diabetes mellitus 09/07/2011  . Benign neoplasm of colon   . Stricture and stenosis of esophagus   . Myocardial infarction 2012  . COPD (chronic obstructive pulmonary disease)   . Dizziness     takes Meclizine daily  . Back pain     cause unknown but does get Cortisone injection every now and then    Past Surgical History  Procedure Laterality Date  . Appendectomy    . Esophageal dilation    . Cardiac catheterization  02/2011    LAD 50, CFX OK, RCA40, EF  55%; PCWP 17, AoV  0.9 cm squared  . Endarterectomy  08-2010    Right carotid endarterectomy    . Aortic valve replacement  08/17/2011    Procedure: AORTIC VALVE REPLACEMENT (AVR);  Surgeon: Gaye Pollack, MD;  Location: [facility];  Service: Open Heart Surgery;  Laterality: N/A;  . Colonoscopy    . Sternal wires removal N/A 05/01/2013    Procedure: STERNAL WIRES REMOVAL;  Surgeon: Gaye Pollack, MD;  Location: [facility];  Service: Thoracic;  Laterality: N/A;    History   Social History  . Marital Status: Widowed    Spouse Name: N/A    Number of Children: 2  . Years of Education: N/A   Occupational History  . 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 Smoking status: Former Smoker -- 2.00 packs/day for 30 years    Types: Cigarettes  . Smokeless tobacco: Never Used     Comment: quit smoking in 1989  . Alcohol Use: No  . Drug Use: No  . Sexual Activity: No   Other Topics Concern  . Not on file   Social History Narrative   LIVES BY HIMSELF  HAS 2 GROWN SONS, 5 G-KIDS (Melissa usually comes with him for his appointments)               Medication List       This list is accurate as of: 09/29/13  5:58 PM.  Always use your most recent med list.               acetaminophen 500 MG tablet  Commonly known as:  TYLENOL  Take 1,000 mg by mouth daily as needed (pain).     apixaban 5 MG Tabs tablet  Commonly known as:  ELIQUIS  Take 1 tablet (5 mg total) by mouth 2 (two) times daily.     furosemide 40 MG tablet  Commonly known as:  LASIX  TAKE 1 TABLET DAILY     meclizine 25 MG tablet  Commonly known as:  ANTIVERT  TAKE 1 TABLET 3 TIMES A DAY AS NEEDED FOR DIZZINESS     omeprazole 20 MG tablet  Commonly known as:  PRILOSEC OTC  Take 2 tablets (40 mg total) by mouth 2 (two) times daily.     potassium chloride SA 20 MEQ tablet  Commonly known as:  KLOR-CON M20  TAKE 1 TABLET DAILY      pravastatin 40 MG tablet  Commonly known as:  PRAVACHOL  TAKE 1 TABLET DAILY     traMADol 50 MG tablet  Commonly known as:  ULTRAM  Take 50 mg by mouth every 4 (four) hours as needed (pain).           Objective:   Physical Exam  HENT:  Head:     BP 117/72  Pulse 90  Temp(Src) 97.8 F (36.6 C)  Wt 216 lb (97.977 kg)  SpO2 97% General -- alert, well-developed, NAD.   Lungs -- normal respiratory effort, no intercostal retractions, no accessory muscle use, and normal breath sounds.  Heart-- normal rate, regular rhythm, no murmur.  Extremities-- no pretibial edema bilaterally  Neurologic--  alert & oriented X3.  Psych-- Cognition and judgment appear intact. Cooperative with normal attention span and concentration. No anxious or depressed appearing.        Assessment & Plan:

## 2013-09-29 NOTE — Assessment & Plan Note (Signed)
Seems to be doing well, cholesterol well-controlled. Will check a BMP

## 2013-09-29 NOTE — Assessment & Plan Note (Signed)
Chart and pertinent labs reviewed  Last A1c satisfactory, no change, return to the office in 4 months

## 2013-09-29 NOTE — Assessment & Plan Note (Signed)
Chart and pertinent labs reviewed  Last ultrasound 2013, due to see cardiology in few weeks

## 2013-10-12 ENCOUNTER — Other Ambulatory Visit: Payer: Self-pay | Admitting: Cardiovascular Disease

## 2013-10-14 DIAGNOSIS — H2510 Age-related nuclear cataract, unspecified eye: Secondary | ICD-10-CM | POA: Diagnosis not present

## 2013-10-14 DIAGNOSIS — H2589 Other age-related cataract: Secondary | ICD-10-CM | POA: Diagnosis not present

## 2013-10-14 DIAGNOSIS — H269 Unspecified cataract: Secondary | ICD-10-CM | POA: Diagnosis not present

## 2013-10-14 HISTORY — PX: EYE SURGERY: SHX253

## 2013-10-21 DIAGNOSIS — L723 Sebaceous cyst: Secondary | ICD-10-CM | POA: Diagnosis not present

## 2013-10-21 DIAGNOSIS — D485 Neoplasm of uncertain behavior of skin: Secondary | ICD-10-CM | POA: Diagnosis not present

## 2013-10-26 ENCOUNTER — Other Ambulatory Visit: Payer: Self-pay | Admitting: Cardiovascular Disease

## 2013-10-26 ENCOUNTER — Other Ambulatory Visit: Payer: Self-pay | Admitting: Internal Medicine

## 2013-11-25 ENCOUNTER — Ambulatory Visit (INDEPENDENT_AMBULATORY_CARE_PROVIDER_SITE_OTHER): Payer: Medicare Other | Admitting: Cardiovascular Disease

## 2013-11-25 ENCOUNTER — Encounter: Payer: Self-pay | Admitting: Cardiovascular Disease

## 2013-11-25 VITALS — BP 160/70 | HR 90 | Ht 67.0 in | Wt 220.0 lb

## 2013-11-25 DIAGNOSIS — R0602 Shortness of breath: Secondary | ICD-10-CM

## 2013-11-25 DIAGNOSIS — I2510 Atherosclerotic heart disease of native coronary artery without angina pectoris: Secondary | ICD-10-CM

## 2013-11-25 DIAGNOSIS — I359 Nonrheumatic aortic valve disorder, unspecified: Secondary | ICD-10-CM | POA: Diagnosis not present

## 2013-11-25 DIAGNOSIS — I714 Abdominal aortic aneurysm, without rupture, unspecified: Secondary | ICD-10-CM | POA: Diagnosis not present

## 2013-11-25 NOTE — Patient Instructions (Signed)
Your physician has requested that you have an echocardiogram. Echocardiography is a painless test that uses sound waves to create images of your heart. It provides your doctor with information about the size and shape of your heart and how well your heart's chambers and valves are working. This procedure takes approximately one hour. There are no restrictions for this procedure.  Your physician has requested that you have an abdominal aorta duplex. During this test, an ultrasound is used to evaluate the aorta. Allow 30 minutes for this exam. Do not eat after midnight the day before and avoid carbonated beverages  Your physician wants you to follow-up in: 6 MONTHS with Dr Burt Knack.  You will receive a reminder letter in the mail two months in advance. If you don't receive a letter, please call our office to schedule the follow-up appointment.  Your physician recommends that you continue on your current medications as directed. Please refer to the Current Medication list given to you today.

## 2013-11-25 NOTE — Progress Notes (Signed)
HPI:   80 year old gentleman presenting for followup evaluation. The patient has undergone bioprosthetic aortic valve replacement in 2013. He's also followed for hypertension and paroxysmal atrial fibrillation and is maintained on chronic anticoagulation with pradaxa. Last echocardiogram in July 2013 showed normal left ventricular systolic function and normal function of his aortic valve bioprosthesis.  The patient is not satisfied with his current symptoms. He continues to have shortness of breath with exertion. He has occasional twinges of pain in the left chest, but these only occur at rest. He denies lightheadedness, leg swelling, orthopnea, or PND. He's had no cough, fevers, or chills. He feels like his breathing is progressively getting worse. He is concerned that there is something wrong with his valve.  Outpatient Encounter Prescriptions as of 11/25/2013  Medication Sig  . acetaminophen (TYLENOL) 500 MG tablet Take 1,000 mg by mouth daily as needed (pain).  Marland Kitchen ELIQUIS 5 MG TABS tablet TAKE 1 TABLET TWICE A DAY  . furosemide (LASIX) 40 MG tablet TAKE 1 TABLET DAILY  . KLOR-CON M20 20 MEQ tablet TAKE 1 TABLET DAILY  . meclizine (ANTIVERT) 25 MG tablet TAKE 1 TABLET 3 TIMES A DAY AS NEEDED FOR DIZZINESS  . omeprazole (PRILOSEC OTC) 20 MG tablet Take 2 tablets (40 mg total) by mouth 2 (two) times daily.  . pravastatin (PRAVACHOL) 40 MG tablet TAKE 1 TABLET DAILY  . traMADol (ULTRAM) 50 MG tablet Take 50 mg by mouth every 4 (four) hours as needed (pain).    No Known Allergies  Past Medical History  Diagnosis Date  . GERD with stricture     w/hx of stricture  . PVD (peripheral vascular disease) 12/07    per cath 12/07....iliac  . Carotid artery occlusion     last u/s 3-11...Marland Kitchenper vasc.surgery 2007 cath: nonobstructive CAD. mil;d MS, trivial AoS, small AAA,  . Bowen's disease     right arm BX: referred to dermatology  . Hyperlipidemia     takes Pravastatin daily  . AAA (abdominal  aortic aneurysm) 05/2008    PER CARDIAC CATH  . Aortic stenosis 05/2008    PER CARDIAC CATH, MILD, MITRAL STENOSIS  . Mitral stenosis 05/2008    PER CARDIAC CATH  . Coronary artery disease 05/2008    MILD, MEDICAL MANAGEMENT  . Pulmonary hypertension   . Dyspnea      w/u included a cath and PFT's, sxms thought to be pulmonary  . Chronic back pain   . Peripheral neuropathy     right thigh; "it's been that way for years"  . Diabetes mellitus 09/07/2011  . Benign neoplasm of colon   . Stricture and stenosis of esophagus   . Myocardial infarction 2012  . COPD (chronic obstructive pulmonary disease)   . Dizziness     takes Meclizine daily  . Back pain     cause unknown but does get Cortisone injection every now and then   ROS: Positive for generalized osteoarthritis with chronic knee pain, otherwise negative except as per HPI  BP 160/70  Pulse 90  Ht 5\' 7"  (1.702 m)  Wt 220 lb (99.791 kg)  BMI 34.45 kg/m2  PHYSICAL EXAM: Pt is alert and oriented, elderly overweight male in NAD HEENT: normal Neck: JVP - normal, carotids 2+= with bilateral bruits Lungs: Diminished breath sounds with prolonged expiratory phase bilaterally, no rales CV: RRR with grade 2/6 harsh early to mid peaking systolic murmur at the left sternal border Abd: soft, NT, Positive BS, no hepatomegaly Ext: Trace  bilateral pretibial edema Skin: warm/dry no rash  EKG:  Normal sinus rhythm with PACs, right bundle branch block, and diffuse T wave abnormality with no significant change from previous  ASSESSMENT AND PLAN: 1. Coronary artery disease, native vessel. The patient's coronary disease has been managed medically. He's had moderate CAD and he has no anginal symptoms.  2. Aortic stenosis status post aortic valve replacement with a bioprosthetic valve. The patient's murmur is a little more pronounced than in the past. I suspect his valve function remains within normal limits, but considering his progressive shortness  of breath I am going to repeat an echocardiogram.  3. Peripheral arterial disease with abdominal aortic aneurysm, iliac occlusion, and carotid stenosis. Will repeat an abdominal aortic ultrasound. His carotid disease is followed by Dr. Scot Dock.   4. Paroxysmal atrial fibrillation. The patient continues on chronic anticoagulation with Eliquis.  5. Obesity. He understands that his weight is playing a role in chronic shortness of breath. Recommended dietary modification with goals for weight loss.   [name]  MD  11/25/2013 5:51 PM

## 2013-12-01 DIAGNOSIS — C4360 Malignant melanoma of unspecified upper limb, including shoulder: Secondary | ICD-10-CM | POA: Diagnosis not present

## 2013-12-01 DIAGNOSIS — L570 Actinic keratosis: Secondary | ICD-10-CM | POA: Diagnosis not present

## 2013-12-01 DIAGNOSIS — C44319 Basal cell carcinoma of skin of other parts of face: Secondary | ICD-10-CM | POA: Diagnosis not present

## 2013-12-01 DIAGNOSIS — L821 Other seborrheic keratosis: Secondary | ICD-10-CM | POA: Diagnosis not present

## 2013-12-02 ENCOUNTER — Encounter: Payer: Self-pay | Admitting: Gastroenterology

## 2013-12-04 ENCOUNTER — Ambulatory Visit (HOSPITAL_COMMUNITY): Payer: Medicare Other | Attending: Cardiovascular Disease | Admitting: Cardiology

## 2013-12-04 ENCOUNTER — Ambulatory Visit (HOSPITAL_BASED_OUTPATIENT_CLINIC_OR_DEPARTMENT_OTHER): Payer: Medicare Other | Admitting: Cardiology

## 2013-12-04 DIAGNOSIS — I714 Abdominal aortic aneurysm, without rupture, unspecified: Secondary | ICD-10-CM

## 2013-12-04 DIAGNOSIS — I359 Nonrheumatic aortic valve disorder, unspecified: Secondary | ICD-10-CM | POA: Insufficient documentation

## 2013-12-04 DIAGNOSIS — I700 Atherosclerosis of aorta: Secondary | ICD-10-CM

## 2013-12-04 DIAGNOSIS — R0602 Shortness of breath: Secondary | ICD-10-CM | POA: Insufficient documentation

## 2013-12-04 NOTE — Progress Notes (Signed)
Aorto-iliac duplex performed 

## 2013-12-04 NOTE — Progress Notes (Signed)
Echo performed. 

## 2013-12-11 ENCOUNTER — Telehealth: Payer: Self-pay | Admitting: Cardiovascular Disease

## 2013-12-11 NOTE — Telephone Encounter (Signed)
New message    Granddaughter calling stating some one called patient on yesterday. rtn call back .

## 2013-12-11 NOTE — Telephone Encounter (Signed)
Spoke with pt's grand daughter and reviewed echo results with her.

## 2013-12-14 ENCOUNTER — Encounter: Payer: Self-pay | Admitting: Internal Medicine

## 2013-12-14 ENCOUNTER — Encounter (HOSPITAL_COMMUNITY): Payer: Self-pay | Admitting: Cardiovascular Disease

## 2013-12-15 ENCOUNTER — Other Ambulatory Visit: Payer: Self-pay | Admitting: Internal Medicine

## 2013-12-15 MED ORDER — TRAMADOL HCL 50 MG PO TABS: 50.0000 mg | ORAL_TABLET | Freq: Two times a day (BID) | ORAL | Status: DC | PRN

## 2013-12-16 NOTE — Telephone Encounter (Signed)
Medication placed at front desk for pick up.

## 2014-01-05 DIAGNOSIS — H26492 Other secondary cataract, left eye: Secondary | ICD-10-CM | POA: Diagnosis not present

## 2014-01-05 DIAGNOSIS — H524 Presbyopia: Secondary | ICD-10-CM | POA: Diagnosis not present

## 2014-01-05 DIAGNOSIS — H2511 Age-related nuclear cataract, right eye: Secondary | ICD-10-CM | POA: Diagnosis not present

## 2014-01-13 DIAGNOSIS — D0461 Carcinoma in situ of skin of right upper limb, including shoulder: Secondary | ICD-10-CM | POA: Diagnosis not present

## 2014-01-13 DIAGNOSIS — Z85820 Personal history of malignant melanoma of skin: Secondary | ICD-10-CM | POA: Diagnosis not present

## 2014-01-13 DIAGNOSIS — L570 Actinic keratosis: Secondary | ICD-10-CM | POA: Diagnosis not present

## 2014-01-13 DIAGNOSIS — Z08 Encounter for follow-up examination after completed treatment for malignant neoplasm: Secondary | ICD-10-CM | POA: Diagnosis not present

## 2014-02-01 ENCOUNTER — Ambulatory Visit (INDEPENDENT_AMBULATORY_CARE_PROVIDER_SITE_OTHER): Payer: Medicare Other | Admitting: Internal Medicine

## 2014-02-01 ENCOUNTER — Encounter: Payer: Self-pay | Admitting: Internal Medicine

## 2014-02-01 ENCOUNTER — Ambulatory Visit (INDEPENDENT_AMBULATORY_CARE_PROVIDER_SITE_OTHER): Payer: Medicare Other

## 2014-02-01 VITALS — BP 145/78 | HR 90 | Temp 97.7°F | Wt 220.4 lb

## 2014-02-01 DIAGNOSIS — R0600 Dyspnea, unspecified: Secondary | ICD-10-CM | POA: Insufficient documentation

## 2014-02-01 DIAGNOSIS — E1159 Type 2 diabetes mellitus with other circulatory complications: Secondary | ICD-10-CM | POA: Diagnosis not present

## 2014-02-01 DIAGNOSIS — R7989 Other specified abnormal findings of blood chemistry: Secondary | ICD-10-CM

## 2014-02-01 DIAGNOSIS — C4490 Unspecified malignant neoplasm of skin, unspecified: Secondary | ICD-10-CM | POA: Diagnosis not present

## 2014-02-01 DIAGNOSIS — J438 Other emphysema: Secondary | ICD-10-CM | POA: Diagnosis not present

## 2014-02-01 DIAGNOSIS — Z23 Encounter for immunization: Secondary | ICD-10-CM | POA: Diagnosis not present

## 2014-02-01 DIAGNOSIS — I2510 Atherosclerotic heart disease of native coronary artery without angina pectoris: Secondary | ICD-10-CM

## 2014-02-01 LAB — BASIC METABOLIC PANEL
BUN: 8 mg/dL (ref 6–23)
CHLORIDE: 102 meq/L (ref 96–112)
CO2: 24 meq/L (ref 19–32)
CREATININE: 1.1 mg/dL (ref 0.4–1.5)
Calcium: 9.6 mg/dL (ref 8.4–10.5)
GFR: 70.93 mL/min (ref >60.00)
GLUCOSE: 94 mg/dL (ref 70–99)
Potassium: 4.3 mEq/L (ref 3.5–5.1)
Sodium: 137 mEq/L (ref 135–145)

## 2014-02-01 LAB — HEMOGLOBIN A1C: HEMOGLOBIN A1C: 6.6 % — AB (ref 4.6–6.5)

## 2014-02-01 LAB — MICROALBUMIN / CREATININE URINE RATIO
Creatinine,U: 23.1 mg/dL
Microalb Creat Ratio: 3.9 mg/g (ref 0.0–30.0)
Microalb, Ur: 0.9 mg/dL (ref 0.0–1.9)

## 2014-02-01 MED ORDER — TIOTROPIUM BROMIDE MONOHYDRATE 18 MCG IN CAPS: 18.0000 ug | ORAL_CAPSULE | Freq: Every day | RESPIRATORY_TRACT | Status: DC

## 2014-02-01 NOTE — Progress Notes (Signed)
Pre visit review using our clinic review tool, if applicable. No additional management support is needed unless otherwise documented below in the visit note. 

## 2014-02-01 NOTE — Assessment & Plan Note (Signed)
Continue with DOE, recently saw cardiology, it was felt that symptoms are out of proportion to cardiac disease. Plan: Start Spiriva daily, if not better consider at albuterol

## 2014-02-01 NOTE — Progress Notes (Signed)
Subjective:    Patient ID: Fernando Moyer, male    DOB: [birth year], 80 y.o.   MRN: [id number]  DOS:  02/01/2014 Type of visit - description : rov Interval history: Saw cards, note reviewed-- echo, Ao [place] ok C/o DOE-SOB - per cards note DOE out of proportion for ECHO findings High cholesterol, good compliance with Lipitor Pain management, takes occasional Ultram   ROS Denies chest pain or lower extremity edema. + DOE after walking more than 1 minute. His walking is limited by both DOE and occasionally hip pain. Cough is infrequent, he does have some wheezing  Labs reviewed, due for a BMP, A1c, TSH  Past Medical History  Diagnosis Date  . GERD with stricture     w/hx of stricture  . PVD (peripheral vascular disease) 12/07    per cath 12/07....iliac  . Carotid artery occlusion     last u/s 3-11...Marland Kitchenper vasc.surgery 2007 cath: nonobstructive CAD. mil;d MS, trivial AoS, small AAA,  . Bowen's disease     right arm BX: referred to dermatology  . Hyperlipidemia     takes Pravastatin daily  . AAA (abdominal aortic aneurysm) 05/2008    PER CARDIAC CATH  . Aortic stenosis 05/2008    PER CARDIAC CATH, MILD, MITRAL STENOSIS  . Mitral stenosis 05/2008    PER CARDIAC CATH  . Coronary artery disease 05/2008    MILD, MEDICAL MANAGEMENT  . Pulmonary hypertension   . Dyspnea      w/u included a cath and PFT's, sxms thought to be pulmonary  . Chronic back pain   . Peripheral neuropathy     right thigh; "it's been that way for years"  . Diabetes mellitus 09/07/2011  . Benign neoplasm of colon   . Stricture and stenosis of esophagus   . Myocardial infarction 2012  . COPD (chronic obstructive pulmonary disease)   . Dizziness     takes Meclizine daily  . Back pain     cause unknown but does get Cortisone injection every now and then    Past Surgical History  Procedure Laterality Date  . Appendectomy    . Esophageal dilation    . Cardiac catheterization  02/2011    LAD 50, CFX OK, RCA40,  EF 55%; PCWP 17, AoV  0.9 cm squared  . Endarterectomy  08-2010    Right carotid endarterectomy    . Aortic valve replacement  08/17/2011    Procedure: AORTIC VALVE REPLACEMENT (AVR);  Surgeon: Gaye Pollack, MD;  Location: [facility];  Service: Open Heart Surgery;  Laterality: N/A;  . Colonoscopy    . Sternal wires removal N/A 05/01/2013    Procedure: STERNAL WIRES REMOVAL;  Surgeon: Gaye Pollack, MD;  Location: [facility];  Service: Thoracic;  Laterality: N/A;    History   Social History  . Marital Status: Widowed    Spouse Name: N/A    Number of Children: 2  . Years of Education: N/A   Occupational History  . 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 Smoking status: Former Smoker -- 2.00 packs/day for 30 years    Types: Cigarettes  . Smokeless tobacco: Never Used     Comment: quit smoking in 1989  . Alcohol Use: No  . Drug Use: No  . Sexual Activity: No   Other Topics Concern  . Not on file  Social History Narrative   LIVES BY HIMSELF    HAS 2 GROWN SONS, 5 G-KIDS (Melissa usually comes with him for his appointments)               Medication List       This list is accurate as of: 02/01/14  6:32 PM.  Always use your most recent med list.               acetaminophen 500 MG tablet  Commonly known as:  TYLENOL  Take 1,000 mg by mouth daily as needed (pain).     ELIQUIS 5 MG Tabs tablet  Generic drug:  apixaban  TAKE 1 TABLET TWICE A DAY     furosemide 40 MG tablet  Commonly known as:  LASIX  TAKE 1 TABLET DAILY     KLOR-CON M20 20 MEQ tablet  Generic drug:  potassium chloride SA  TAKE 1 TABLET DAILY     meclizine 25 MG tablet  Commonly known as:  ANTIVERT  TAKE 1 TABLET 3 TIMES A DAY AS NEEDED FOR DIZZINESS     omeprazole 20 MG tablet  Commonly known as:  PRILOSEC OTC  Take 2 tablets (40 mg total) by mouth 2 (two) times daily.     pravastatin 40 MG tablet  Commonly  known as:  PRAVACHOL  TAKE 1 TABLET DAILY     tiotropium 18 MCG inhalation capsule  Commonly known as:  SPIRIVA HANDIHALER  Place 1 capsule (18 mcg total) into inhaler and inhale daily.     traMADol 50 MG tablet  Commonly known as:  ULTRAM  Take 1 tablet (50 mg total) by mouth every 12 (twelve) hours as needed (pain).           Objective:   Physical Exam BP 145/78 mmHg  Pulse 90  Temp(Src) 97.7 F (36.5 C) (Oral)  Wt 220 lb 6 oz (99.961 kg)  SpO2 96% General -- alert, well-developed, NAD.  Lungs -- normal respiratory effort, no intercostal retractions, no accessory muscle use, and decreased l breath sounds.  Heart-- + syst  murmur.  Extremities-- no pretibial edema bilaterally  Neurologic--  alert & oriented X3. Speech normal, gait appropriate for age, strength symmetric and appropriate for age. Psych-- Cognition and judgment appear intact. Cooperative with normal attention span and concentration. No anxious or depressed appearing.        Assessment & Plan:

## 2014-02-01 NOTE — Patient Instructions (Signed)
Get your blood work before you leave    start Spiriva, one puff every day. If that is not helping with your breathing and stamina please let us know   Please come back to the office by 06-2014 for a physical exam. Come back fasting

## 2014-02-01 NOTE — Assessment & Plan Note (Signed)
On diet control only, check A1c and microalbumin

## 2014-02-01 NOTE — Assessment & Plan Note (Signed)
See comments under emphysema

## 2014-02-01 NOTE — Assessment & Plan Note (Signed)
Recently diagnosed with melanoma BCC, follow-up by dermatology

## 2014-02-09 ENCOUNTER — Telehealth: Payer: Self-pay | Admitting: Cardiovascular Disease

## 2014-02-09 NOTE — Telephone Encounter (Signed)
This is fine.-thx 

## 2014-02-09 NOTE — Telephone Encounter (Signed)
New message     Patient need to stop eliquis 3 days before injection. Procedure on  11/25

## 2014-02-12 NOTE — Telephone Encounter (Signed)
I will fax response to [place].

## 2014-02-16 ENCOUNTER — Telehealth: Payer: Self-pay | Admitting: Cardiovascular Disease

## 2014-02-17 ENCOUNTER — Other Ambulatory Visit: Payer: Self-pay

## 2014-02-17 ENCOUNTER — Other Ambulatory Visit: Payer: Self-pay | Admitting: Physician Assistant

## 2014-02-17 MED ORDER — TIOTROPIUM BROMIDE MONOHYDRATE 18 MCG IN CAPS: 18.0000 ug | ORAL_CAPSULE | Freq: Every day | RESPIRATORY_TRACT | Status: DC

## 2014-02-17 NOTE — Telephone Encounter (Signed)
Resent to [facility] 

## 2014-02-17 NOTE — Telephone Encounter (Signed)
Caller name: [place] Relation to pt: other / pharmacy    Reason for call:  As per mail delivery rx was never received  tiotropium (SPIRIVA HANDIHALER) 18 MCG inhalation capsule

## 2014-02-24 DIAGNOSIS — M5136 Other intervertebral disc degeneration, lumbar region: Secondary | ICD-10-CM | POA: Diagnosis not present

## 2014-03-05 ENCOUNTER — Telehealth: Payer: Self-pay | Admitting: Cardiovascular Disease

## 2014-03-05 NOTE — Telephone Encounter (Signed)
New message     Need clearance for pt to stop eliquis 3 days prior to back procedure.   Fax clearance to [phone].

## 2014-03-05 NOTE — Telephone Encounter (Signed)
Talked to [place] from [place]. Patient is having a lumbar spinal nerve root block on 03/12/2014. They are needing a clearance from Dr. Burt Knack for the patient to stop eliquis 3 days prior to the back procedure.  Will forward to Dr. Burt Knack.  Ewell Poe RN

## 2014-03-06 ENCOUNTER — Emergency Department (HOSPITAL_BASED_OUTPATIENT_CLINIC_OR_DEPARTMENT_OTHER)
Admission: EM | Admit: 2014-03-06 | Discharge: 2014-03-06 | Disposition: A | Discharge status: Home / Self Care | Payer: Medicare Other | Attending: Emergency Medicine | Admitting: Emergency Medicine

## 2014-03-06 ENCOUNTER — Encounter (HOSPITAL_BASED_OUTPATIENT_CLINIC_OR_DEPARTMENT_OTHER): Payer: Self-pay | Admitting: *Deleted

## 2014-03-06 ENCOUNTER — Emergency Department (HOSPITAL_BASED_OUTPATIENT_CLINIC_OR_DEPARTMENT_OTHER): Payer: Medicare Other

## 2014-03-06 DIAGNOSIS — Z87891 Personal history of nicotine dependence: Secondary | ICD-10-CM | POA: Insufficient documentation

## 2014-03-06 DIAGNOSIS — L03032 Cellulitis of left toe: Secondary | ICD-10-CM | POA: Insufficient documentation

## 2014-03-06 DIAGNOSIS — G8929 Other chronic pain: Secondary | ICD-10-CM | POA: Insufficient documentation

## 2014-03-06 DIAGNOSIS — J449 Chronic obstructive pulmonary disease, unspecified: Secondary | ICD-10-CM | POA: Diagnosis not present

## 2014-03-06 DIAGNOSIS — L538 Other specified erythematous conditions: Secondary | ICD-10-CM | POA: Diagnosis not present

## 2014-03-06 DIAGNOSIS — E785 Hyperlipidemia, unspecified: Secondary | ICD-10-CM | POA: Diagnosis not present

## 2014-03-06 DIAGNOSIS — Z86010 Personal history of colonic polyps: Secondary | ICD-10-CM | POA: Insufficient documentation

## 2014-03-06 DIAGNOSIS — I2510 Atherosclerotic heart disease of native coronary artery without angina pectoris: Secondary | ICD-10-CM | POA: Insufficient documentation

## 2014-03-06 DIAGNOSIS — I252 Old myocardial infarction: Secondary | ICD-10-CM | POA: Insufficient documentation

## 2014-03-06 DIAGNOSIS — R200 Anesthesia of skin: Secondary | ICD-10-CM | POA: Diagnosis not present

## 2014-03-06 DIAGNOSIS — K219 Gastro-esophageal reflux disease without esophagitis: Secondary | ICD-10-CM | POA: Insufficient documentation

## 2014-03-06 DIAGNOSIS — Z79899 Other long term (current) drug therapy: Secondary | ICD-10-CM | POA: Insufficient documentation

## 2014-03-06 DIAGNOSIS — E119 Type 2 diabetes mellitus without complications: Secondary | ICD-10-CM | POA: Diagnosis not present

## 2014-03-06 DIAGNOSIS — M79675 Pain in left toe(s): Secondary | ICD-10-CM | POA: Diagnosis present

## 2014-03-06 MED ORDER — DOXYCYCLINE HYCLATE 100 MG PO CAPS: 100.0000 mg | ORAL_CAPSULE | Freq: Two times a day (BID) | ORAL | Status: DC

## 2014-03-06 NOTE — ED Provider Notes (Signed)
CSN: [id number]     Arrival date & time 03/06/14  1017 History   First MD Initiated Contact with Patient 03/06/14 1110     Chief Complaint  Patient presents with  . Toe Pain     (Consider location/radiation/quality/duration/timing/severity/associated sxs/prior Treatment) HPI  80 year old male presents with left fourth toe pain over the last 3 days. Does not remember an injury. Feels like his toe is numb as well. He has noticed redness and swelling. Denies fevers or chills. Denies any trauma. Was told he is a borderline diabetic but states his most recent tests indicated he had no diabetes. Patient's been taking tramadol with moderate relief. Has never had a history of gout before. Toe is very sensitive to even light touch and has to sleep with his foot off the bed.  Past Medical History  Diagnosis Date  . GERD with stricture     w/hx of stricture  . PVD (peripheral vascular disease) 12/07    per cath 12/07....iliac  . Carotid artery occlusion     last u/s 3-11...Marland Kitchenper vasc.surgery 2007 cath: nonobstructive CAD. mil;d MS, trivial AoS, small AAA,  . Bowen's disease     right arm BX: referred to dermatology  . Hyperlipidemia     takes Pravastatin daily  . AAA (abdominal aortic aneurysm) 05/2008    PER CARDIAC CATH  . Aortic stenosis 05/2008    PER CARDIAC CATH, MILD, MITRAL STENOSIS  . Mitral stenosis 05/2008    PER CARDIAC CATH  . Coronary artery disease 05/2008    MILD, MEDICAL MANAGEMENT  . Pulmonary hypertension   . Dyspnea      w/u included a cath and PFT's, sxms thought to be pulmonary  . Chronic back pain   . Peripheral neuropathy     right thigh; "it's been that way for years"  . Diabetes mellitus 09/07/2011  . Benign neoplasm of colon   . Stricture and stenosis of esophagus   . Myocardial infarction 2012  . COPD (chronic obstructive pulmonary disease)   . Dizziness     takes Meclizine daily  . Back pain     cause unknown but does get Cortisone injection every now and  then   Past Surgical History  Procedure Laterality Date  . Appendectomy    . Esophageal dilation    . Cardiac catheterization  02/2011    LAD 50, CFX OK, RCA40, EF 55%; PCWP 17, AoV  0.9 cm squared  . Endarterectomy  08-2010    Right carotid endarterectomy    . Aortic valve replacement  08/17/2011    Procedure: AORTIC VALVE REPLACEMENT (AVR);  Surgeon: Gaye Pollack, MD;  Location: [place];  Service: Open Heart Surgery;  Laterality: N/A;  . Colonoscopy    . Sternal wires removal N/A 05/01/2013    Procedure: STERNAL WIRES REMOVAL;  Surgeon: Gaye Pollack, MD;  Location: [facility];  Service: Thoracic;  Laterality: N/A;   Family History  Problem Relation Age of Onset  . Heart attack Father 56    MI  . Diabetes Neg Hx   . Colon cancer Neg Hx   . Prostate cancer Neg Hx   . Anesthesia problems Neg Hx   . Hypotension Neg Hx   . Malignant hyperthermia Neg Hx   . Pseudochol deficiency Neg Hx   . Stomach cancer Neg Hx    History  Substance Use Topics  . Smoking status: Former Smoker -- 2.00 packs/day for 30 years    Types: Cigarettes  .  Smokeless tobacco: Never Used     Comment: quit smoking in 1989  . Alcohol Use: No    Review of Systems  Constitutional: Negative for fever.  Musculoskeletal: Positive for joint swelling.  Skin: Positive for color change. Negative for wound.  Neurological: Positive for numbness. Negative for weakness.  All other systems reviewed and are negative.     Allergies  Review of patient's allergies indicates no known allergies.  Home Medications   Prior to Admission medications   Medication Sig Start Date End Date Taking? Authorizing Provider  acetaminophen (TYLENOL) 500 MG tablet Take 1,000 mg by mouth daily as needed (pain).    Historical Provider, MD  ELIQUIS 5 MG TABS tablet TAKE 1 TABLET TWICE A DAY 02/18/14   [name], MD  furosemide (LASIX) 40 MG tablet TAKE 1 TABLET DAILY 02/18/14   [name], MD  KLOR-CON M20 20 MEQ tablet TAKE 1  TABLET DAILY 02/18/14   [name], MD  meclizine (ANTIVERT) 25 MG tablet TAKE 1 TABLET 3 TIMES A DAY AS NEEDED FOR DIZZINESS    Colon Branch, MD  omeprazole (PRILOSEC OTC) 20 MG tablet Take 2 tablets (40 mg total) by mouth 2 (two) times daily. 11/22/12   Sheila Oats, MD  pravastatin (PRAVACHOL) 40 MG tablet TAKE 1 TABLET DAILY 02/17/14   Colon Branch, MD  tiotropium (SPIRIVA HANDIHALER) 18 MCG inhalation capsule Place 1 capsule (18 mcg total) into inhaler and inhale daily. 02/17/14   Colon Branch, MD  traMADol (ULTRAM) 50 MG tablet Take 1 tablet (50 mg total) by mouth every 12 (twelve) hours as needed (pain). 12/15/13   Colon Branch, MD   BP 127/96 mmHg  Pulse 63  Temp(Src) 97.8 F (36.6 C) (Oral)  Resp 17  SpO2 99% Physical Exam  Constitutional: He is oriented to person, place, and time. He appears well-developed and well-nourished.  HENT:  Head: Normocephalic and atraumatic.  Right Ear: External ear normal.  Left Ear: External ear normal.  Nose: Nose normal.  Eyes: Right eye exhibits no discharge. Left eye exhibits no discharge.  Cardiovascular: Normal rate and intact distal pulses.   Pulses:      Dorsalis pedis pulses are 2+ on the left side.  Pulmonary/Chest: Effort normal.  Musculoskeletal: He exhibits no edema.       Feet:  Neurological: He is alert and oriented to person, place, and time.  Skin: Skin is warm and dry.  Nursing note and vitals reviewed.   ED Course  Procedures (including critical care time) Labs Review Labs Reviewed - No data to display  Imaging Review Dg Foot Complete Left  03/06/2014   CLINICAL DATA:  Pain, erythema, swelling of the fourth toe for 3 days, no trauma.  EXAM: LEFT FOOT - COMPLETE 3+ VIEW  COMPARISON:  None.  FINDINGS: There is no evidence of fracture or dislocation. There is no evidence of arthropathy or other focal bone abnormality. Soft tissues are unremarkable. Vascular calcifications are noted.  IMPRESSION: Negative.   Electronically  Signed   By: Conchita Paris M.D.   On: 03/06/2014 12:08     EKG Interpretation None      MDM   Final diagnoses:  Cellulitis of toe, left    This is likely gout versus cellulitis of his toe. No other infectious symptoms. No signs of abscess. X-ray shows no trauma or obvious effusion. At this point will treat symptomatically with pain control as well as antibiotics in case this is infectious  source. He is not a diabetic. He is well appearing at this time and I have recommended close follow-up with his PCP next week.    Ephraim Hamburger, MD 03/06/14 [phone]

## 2014-03-06 NOTE — ED Notes (Signed)
MD at bedside. 

## 2014-03-06 NOTE — Discharge Instructions (Signed)
Cellulitis °Cellulitis is an infection of the skin and the tissue beneath it. The infected area is usually red and tender. Cellulitis occurs most often in the arms and lower legs.  °CAUSES  °Cellulitis is caused by bacteria that enter the skin through cracks or cuts in the skin. The most common types of bacteria that cause cellulitis are staphylococci and streptococci. °SIGNS AND SYMPTOMS  °· Redness and warmth. °· Swelling. °· Tenderness or pain. °· Fever. °DIAGNOSIS  °Your health care provider can usually determine what is wrong based on a physical exam. Blood tests may also be done. °TREATMENT  °Treatment usually involves taking an antibiotic medicine. °HOME CARE INSTRUCTIONS  °· Take your antibiotic medicine as directed by your health care provider. Finish the antibiotic even if you start to feel better. °· Keep the infected arm or leg elevated to reduce swelling. °· Apply a warm cloth to the affected area up to 4 times per day to relieve pain. °· Take medicines only as directed by your health care provider. °· Keep all follow-up visits as directed by your health care provider. °SEEK MEDICAL CARE IF:  °· You notice red streaks coming from the infected area. °· Your red area gets larger or turns dark in color. °· Your bone or joint underneath the infected area becomes painful after the skin has healed. °· Your infection returns in the same area or another area. °· You notice a swollen bump in the infected area. °· You develop new symptoms. °· You have a fever. °SEEK IMMEDIATE MEDICAL CARE IF:  °· You feel very sleepy. °· You develop vomiting or diarrhea. °· You have a general ill feeling (malaise) with muscle aches and pains. °MAKE SURE YOU:  °· Understand these instructions. °· Will watch your condition. °· Will get help right away if you are not doing well or get worse. °Document Released: 12/27/2004 Document Revised: 08/03/2013 Document Reviewed: 06/04/2011 °ExitCare® Patient Information ©2015 [employer].  This information is not intended to replace advice given to you by your health care provider. Make sure you discuss any questions you have with your health care provider. ° °Gout °Gout is an inflammatory arthritis caused by a buildup of uric acid crystals in the joints. Uric acid is a chemical that is normally present in the blood. When the level of uric acid in the blood is too high it can form crystals that deposit in your joints and tissues. This causes joint redness, soreness, and swelling (inflammation). Repeat attacks are common. Over time, uric acid crystals can form into masses (tophi) near a joint, destroying bone and causing disfigurement. Gout is treatable and often preventable. °CAUSES  °The disease begins with elevated levels of uric acid in the blood. Uric acid is produced by your body when it breaks down a naturally found substance called purines. Certain foods you eat, such as meats and fish, contain high amounts of purines. Causes of an elevated uric acid level include: °· Being passed down from parent to child (heredity). °· Diseases that cause increased uric acid production (such as obesity, psoriasis, and certain cancers). °· Excessive alcohol use. °· Diet, especially diets rich in meat and seafood. °· Medicines, including certain cancer-fighting medicines (chemotherapy), water pills (diuretics), and aspirin. °· Chronic kidney disease. The kidneys are no longer able to remove uric acid well. °· Problems with metabolism. °Conditions strongly associated with gout include: °· Obesity. °· High blood pressure. °· High cholesterol. °· Diabetes. °Not everyone with elevated uric acid levels gets   gout. It is not understood why some people get gout and others do not. Surgery, joint injury, and eating too much of certain foods are some of the factors that can lead to gout attacks. °SYMPTOMS  °· An attack of gout comes on quickly. It causes intense pain with redness, swelling, and warmth in a joint. °· Fever  can occur. °· Often, only one joint is involved. Certain joints are more commonly involved: °¨ Base of the big toe. °¨ Knee. °¨ Ankle. °¨ Wrist. °¨ Finger. °Without treatment, an attack usually goes away in a few days to weeks. Between attacks, you usually will not have symptoms, which is different from many other forms of arthritis. °DIAGNOSIS  °Your caregiver will suspect gout based on your symptoms and exam. In some cases, tests may be recommended. The tests may include: °· Blood tests. °· Urine tests. °· X-rays. °· Joint fluid exam. This exam requires a needle to remove fluid from the joint (arthrocentesis). Using a microscope, gout is confirmed when uric acid crystals are seen in the joint fluid. °TREATMENT  °There are two phases to gout treatment: treating the sudden onset (acute) attack and preventing attacks (prophylaxis). °· Treatment of an Acute Attack. °¨ Medicines are used. These include anti-inflammatory medicines or steroid medicines. °¨ An injection of steroid medicine into the affected joint is sometimes necessary. °¨ The painful joint is rested. Movement can worsen the arthritis. °¨ You may use warm or cold treatments on painful joints, depending which works best for you. °· Treatment to Prevent Attacks. °¨ If you suffer from frequent gout attacks, your caregiver may advise preventive medicine. These medicines are started after the acute attack subsides. These medicines either help your kidneys eliminate uric acid from your body or decrease your uric acid production. You may need to stay on these medicines for a very long time. °¨ The early phase of treatment with preventive medicine can be associated with an increase in acute gout attacks. For this reason, during the first few months of treatment, your caregiver may also advise you to take medicines usually used for acute gout treatment. Be sure you understand your caregiver's directions. Your caregiver may make several adjustments to your medicine  dose before these medicines are effective. °¨ Discuss dietary treatment with your caregiver or dietitian. Alcohol and drinks high in sugar and fructose and foods such as meat, poultry, and seafood can increase uric acid levels. Your caregiver or dietitian can advise you on drinks and foods that should be limited. °HOME CARE INSTRUCTIONS  °· Do not take aspirin to relieve pain. This raises uric acid levels. °· Only take over-the-counter or prescription medicines for pain, discomfort, or fever as directed by your caregiver. °· Rest the joint as much as possible. When in bed, keep sheets and blankets off painful areas. °· Keep the affected joint raised (elevated). °· Apply warm or cold treatments to painful joints. Use of warm or cold treatments depends on which works best for you. °· Use crutches if the painful joint is in your leg. °· Drink enough fluids to keep your urine clear or pale yellow. This helps your body get rid of uric acid. Limit alcohol, sugary drinks, and fructose drinks. °· Follow your dietary instructions. Pay careful attention to the amount of protein you eat. Your daily diet should emphasize fruits, vegetables, whole grains, and fat-free or low-fat milk products. Discuss the use of coffee, vitamin C, and cherries with your caregiver or dietitian. These may be helpful in   lowering uric acid levels. °· Maintain a healthy body weight. °SEEK MEDICAL CARE IF:  °· You develop diarrhea, vomiting, or any side effects from medicines. °· You do not feel better in 24 hours, or you are getting worse. °SEEK IMMEDIATE MEDICAL CARE IF:  °· Your joint becomes suddenly more tender, and you have chills or a fever. °MAKE SURE YOU:  °· Understand these instructions. °· Will watch your condition. °· Will get help right away if you are not doing well or get worse. °Document Released: 03/16/2000 Document Revised: 08/03/2013 Document Reviewed: 10/31/2011 °ExitCare® Patient Information ©2015 [employer]. This information  is not intended to replace advice given to you by your health care provider. Make sure you discuss any questions you have with your health care provider. ° °

## 2014-03-06 NOTE — Telephone Encounter (Signed)
Ok to hold eliquis x 3 days before nerve block. thx

## 2014-03-06 NOTE — ED Notes (Addendum)
Patient c/o L foot toe pain ( #IV) for the past two days that has grown worse, no injury, toe red/swollen

## 2014-03-08 NOTE — Telephone Encounter (Signed)
I will fax this note to Deanna at [place] [phone]).

## 2014-03-10 ENCOUNTER — Encounter (HOSPITAL_COMMUNITY): Payer: Self-pay | Admitting: Cardiovascular Disease

## 2014-03-15 ENCOUNTER — Encounter: Payer: Self-pay | Admitting: Medical

## 2014-03-15 ENCOUNTER — Ambulatory Visit (INDEPENDENT_AMBULATORY_CARE_PROVIDER_SITE_OTHER): Payer: Medicare Other | Admitting: Medical

## 2014-03-15 VITALS — BP 144/80 | HR 61 | Temp 97.9°F | Ht 67.0 in | Wt 215.6 lb

## 2014-03-15 DIAGNOSIS — I2510 Atherosclerotic heart disease of native coronary artery without angina pectoris: Secondary | ICD-10-CM | POA: Diagnosis not present

## 2014-03-15 DIAGNOSIS — L089 Local infection of the skin and subcutaneous tissue, unspecified: Secondary | ICD-10-CM | POA: Diagnosis not present

## 2014-03-15 DIAGNOSIS — L03032 Cellulitis of left toe: Secondary | ICD-10-CM

## 2014-03-15 MED ORDER — SULFAMETHOXAZOLE-TRIMETHOPRIM 800-160 MG PO TABS: 1.0000 | ORAL_TABLET | Freq: Two times a day (BID) | ORAL | Status: DC

## 2014-03-15 NOTE — Progress Notes (Signed)
Subjective:    Patient ID: Fernando Moyer, male    DOB: [birth year], 80 y.o.   MRN: [id number]  HPI   Pt in with with lt 4th toe that was painful about 10 days ago. Pt states on 03-06-2014  went to ED. He was given doxycycline. He had a negative xray of the foot on day he went to the ED.  His toe pain got a little better but not much. When he stopped the medications the toe started hurting again. Only mild decrease in swelling per pt. Pt was told in past borderline diabetic. Mild elevated a1-c. Was 6.9.- in the past.  Past Medical History  Diagnosis Date  . GERD with stricture     w/hx of stricture  . PVD (peripheral vascular disease) 12/07    per cath 12/07....iliac  . Carotid artery occlusion     last u/s 3-11...Marland Kitchenper vasc.surgery 2007 cath: nonobstructive CAD. mil;d MS, trivial AoS, small AAA,  . Bowen's disease     right arm BX: referred to dermatology  . Hyperlipidemia     takes Pravastatin daily  . AAA (abdominal aortic aneurysm) 05/2008    PER CARDIAC CATH  . Aortic stenosis 05/2008    PER CARDIAC CATH, MILD, MITRAL STENOSIS  . Mitral stenosis 05/2008    PER CARDIAC CATH  . Coronary artery disease 05/2008    MILD, MEDICAL MANAGEMENT  . Pulmonary hypertension   . Dyspnea      w/u included a cath and PFT's, sxms thought to be pulmonary  . Chronic back pain   . Peripheral neuropathy     right thigh; "it's been that way for years"  . Diabetes mellitus 09/07/2011  . Benign neoplasm of colon   . Stricture and stenosis of esophagus   . Myocardial infarction 2012  . COPD (chronic obstructive pulmonary disease)   . Dizziness     takes Meclizine daily  . Back pain     cause unknown but does get Cortisone injection every now and then    History   Social History  . Marital Status: Widowed    Spouse Name: N/A    Number of Children: 2  . Years of Education: N/A   Occupational History  . 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 Smoking status: Former Smoker -- 2.00 packs/day for 30 years    Types: Cigarettes  . Smokeless tobacco: Never Used     Comment: quit smoking in 1989  . Alcohol Use: No  . Drug Use: No  . Sexual Activity: No   Other Topics Concern  . Not on file   Social History Narrative   LIVES BY HIMSELF    HAS 2 GROWN SONS, 5 G-KIDS (Melissa usually comes with him for his appointments)           Past Surgical History  Procedure Laterality Date  . Appendectomy    . Esophageal dilation    . Cardiac catheterization  02/2011    LAD 50, CFX OK, RCA40, EF 55%; PCWP 17, AoV  0.9 cm squared  . Endarterectomy  08-2010    Right carotid endarterectomy    . Aortic valve replacement  08/17/2011    Procedure: AORTIC VALVE REPLACEMENT (AVR);  Surgeon: Gaye Pollack, MD;  Location: [place];  Service: Open Heart Surgery;  Laterality: N/A;  . Colonoscopy    .  Sternal wires removal N/A 05/01/2013    Procedure: STERNAL WIRES REMOVAL;  Surgeon: Gaye Pollack, MD;  Location: [place];  Service: Thoracic;  Laterality: N/A;  . Left and right heart catheterization with coronary angiogram N/A 02/23/2011    Procedure: LEFT AND RIGHT HEART CATHETERIZATION WITH CORONARY ANGIOGRAM;  Surgeon: [name], MD;  Location: [facility] CATH [facility];  Service: Cardiovascular;  Laterality: N/A;    Family History  Problem Relation Age of Onset  . Heart attack Father 33    MI  . Diabetes Neg Hx   . Colon cancer Neg Hx   . Prostate cancer Neg Hx   . Anesthesia problems Neg Hx   . Hypotension Neg Hx   . Malignant hyperthermia Neg Hx   . Pseudochol deficiency Neg Hx   . Stomach cancer Neg Hx     No Known Allergies  Current Outpatient Prescriptions on File Prior to Visit  Medication Sig Dispense Refill  . acetaminophen (TYLENOL) 500 MG tablet Take 1,000 mg by mouth daily as needed (pain).    Marland Kitchen ELIQUIS 5 MG TABS tablet TAKE 1 TABLET TWICE A DAY 180 tablet 0  . furosemide (LASIX) 40 MG tablet  TAKE 1 TABLET DAILY 90 tablet 0  . KLOR-CON M20 20 MEQ tablet TAKE 1 TABLET DAILY 90 tablet 0  . meclizine (ANTIVERT) 25 MG tablet TAKE 1 TABLET 3 TIMES A DAY AS NEEDED FOR DIZZINESS 30 tablet 1  . omeprazole (PRILOSEC OTC) 20 MG tablet Take 2 tablets (40 mg total) by mouth 2 (two) times daily. 120 tablet 0  . pravastatin (PRAVACHOL) 40 MG tablet TAKE 1 TABLET DAILY 90 tablet 1  . tiotropium (SPIRIVA HANDIHALER) 18 MCG inhalation capsule Place 1 capsule (18 mcg total) into inhaler and inhale daily. 90 capsule 2  . traMADol (ULTRAM) 50 MG tablet Take 1 tablet (50 mg total) by mouth every 12 (twelve) hours as needed (pain). 60 tablet 0  . doxycycline (VIBRAMYCIN) 100 MG capsule Take 1 capsule (100 mg total) by mouth 2 (two) times daily. (Patient not taking: Reported on 03/15/2014) 14 capsule 0   No current facility-administered medications on file prior to visit.    BP 144/80 mmHg  Pulse 61  Temp(Src) 97.9 F (36.6 C) (Oral)  Ht 5\' 7"  (1.702 m)  Wt 215 lb 9.6 oz (97.796 kg)  BMI 33.76 kg/m2  SpO2 97%        Review of Systems  Constitutional: Negative for fever, chills and fatigue.  Respiratory: Negative for cough, choking, shortness of breath and wheezing.   Musculoskeletal:       Lt-4th toe red, swollen and tender. Lateral aspect very tender.       Objective:   Physical Exam   General- No acute distress.  Lungs- CTA  Heart- RRR  Lt 4th toe- nail appears to have some fungus. Lateral aspect- pad of toe and lateral aspect very red swollen and tender. No discharge present.       Assessment & Plan:

## 2014-03-15 NOTE — Patient Instructions (Addendum)
Your left 4th toe lateral aspect appears to have infected area with possible forming abscess that may need I and D. I am referring to podiatrist this week on wed. You have appointment info sheet.   Since this area did not get better with first round of doxycycline, I am prescribing bactrim ds bid until you can be seen by the specialist.  Follow up in 7 days here or as needed.

## 2014-03-15 NOTE — Progress Notes (Signed)
Pre visit review using our clinic review tool, if applicable. No additional management support is needed unless otherwise documented below in the visit note. 

## 2014-03-15 NOTE — Assessment & Plan Note (Signed)
Your left 4th toe lateral aspect appears to have infected area with possible forming abscess that may need I and D. I am referring to podiatrist this week on wed. You have appointment info sheet.   Since this area did not get better with first round of doxycycline, I am prescribing bactrim ds bid until you can be seen by the specialist.

## 2014-03-17 DIAGNOSIS — L03032 Cellulitis of left toe: Secondary | ICD-10-CM | POA: Diagnosis not present

## 2014-03-17 DIAGNOSIS — M79675 Pain in left toe(s): Secondary | ICD-10-CM | POA: Diagnosis not present

## 2014-03-23 ENCOUNTER — Telehealth: Payer: Self-pay | Admitting: Internal Medicine

## 2014-03-24 ENCOUNTER — Other Ambulatory Visit: Payer: Self-pay

## 2014-03-24 MED ORDER — TRAMADOL HCL 50 MG PO TABS: 50.0000 mg | ORAL_TABLET | Freq: Two times a day (BID) | ORAL | Status: DC | PRN

## 2014-03-24 NOTE — Telephone Encounter (Signed)
Faxed to [facility].

## 2014-03-24 NOTE — Telephone Encounter (Signed)
Okay #60, UDS when he comes back to the office

## 2014-03-24 NOTE — Telephone Encounter (Signed)
Pt is requesting refill on Tramadol.  Last OV: 02/01/2014 Last Fill: 12/15/2013 # [id number] UDS: None  Please advise.

## 2014-03-31 DIAGNOSIS — L03032 Cellulitis of left toe: Secondary | ICD-10-CM | POA: Diagnosis not present

## 2014-04-13 ENCOUNTER — Encounter: Payer: Self-pay | Admitting: Vascular Surgery

## 2014-04-14 ENCOUNTER — Encounter: Payer: Self-pay | Admitting: Vascular Surgery

## 2014-04-14 ENCOUNTER — Ambulatory Visit (INDEPENDENT_AMBULATORY_CARE_PROVIDER_SITE_OTHER): Payer: Medicare Other | Admitting: Vascular Surgery

## 2014-04-14 ENCOUNTER — Ambulatory Visit (HOSPITAL_COMMUNITY)
Admission: RE | Admit: 2014-04-14 | Discharge: 2014-04-14 | Disposition: A | Discharge status: Home / Self Care | Payer: Medicare Other | Source: Ambulatory Visit | Attending: Vascular Surgery | Admitting: Vascular Surgery

## 2014-04-14 VITALS — BP 141/82 | HR 73 | Ht 67.0 in | Wt 216.0 lb

## 2014-04-14 DIAGNOSIS — I6523 Occlusion and stenosis of bilateral carotid arteries: Secondary | ICD-10-CM | POA: Insufficient documentation

## 2014-04-14 DIAGNOSIS — I771 Stricture of artery: Secondary | ICD-10-CM | POA: Insufficient documentation

## 2014-04-14 DIAGNOSIS — I6529 Occlusion and stenosis of unspecified carotid artery: Secondary | ICD-10-CM | POA: Insufficient documentation

## 2014-04-14 NOTE — Addendum Note (Signed)
Addended by: Mena Goes on: 04/14/2014 03:50 PM   Modules accepted: Orders

## 2014-04-14 NOTE — Assessment & Plan Note (Signed)
The patient is doing well status post right carotid endarterectomy in 2012. There is no evidence of restenosis. We are following a moderate left carotid stenosis which is stable at 40-59%. I've ordered a follow up duplex can in 1 year and I'll see him back at that time. He knows to call sooner if he has problems. He is not on aspirin because of a medical contraindication. He is on a statin.

## 2014-04-14 NOTE — Progress Notes (Signed)
[facility] [place]  Patient name: Fernando Moyer MRN: [id number] DOB: 1933-05-21 Sex: male  REASON FOR VISIT: follow up of bilateral carotid stenoses.  HPI: Fernando Moyer is a 81 y.o. male who underwent a right carotid endarterectomy in May 2012. We've been following a moderate left carotid stenosis. He comes in for a 1 year follow up visit. Since I saw him last, he denies any history of stroke, TIAs, expressive or receptive aphasia, or amaurosis fugax.  Of note, he does not take aspirin because of a medical reason. He's not sure exactly what happened but he was told to stop taking aspirin and also not to take nitroglycerin.  He is on a statin.  Past Medical History  Diagnosis Date  . GERD with stricture     w/hx of stricture  . PVD (peripheral vascular disease) 12/07    per cath 12/07....iliac  . Carotid artery occlusion     last u/s 3-11...Marland Kitchenper vasc.surgery 2007 cath: nonobstructive CAD. mil;d MS, trivial AoS, small AAA,  . Bowen's disease     right arm BX: referred to dermatology  . Hyperlipidemia     takes Pravastatin daily  . AAA (abdominal aortic aneurysm) 05/2008    PER CARDIAC CATH  . Aortic stenosis 05/2008    PER CARDIAC CATH, MILD, MITRAL STENOSIS  . Mitral stenosis 05/2008    PER CARDIAC CATH  . Coronary artery disease 05/2008    MILD, MEDICAL MANAGEMENT  . Pulmonary hypertension   . Dyspnea      w/u included a cath and PFT's, sxms thought to be pulmonary  . Chronic back pain   . Peripheral neuropathy     right thigh; "it's been that way for years"  . Diabetes mellitus 09/07/2011  . Benign neoplasm of colon   . Stricture and stenosis of esophagus   . Myocardial infarction 2012  . COPD (chronic obstructive pulmonary disease)   . Dizziness     takes Meclizine daily  . Back pain     cause unknown but does get Cortisone injection every now and then   Family History  Problem Relation Age of Onset  . Heart attack Father 11    MI  .  Diabetes Neg Hx   . Colon cancer Neg Hx   . Prostate cancer Neg Hx   . Anesthesia problems Neg Hx   . Hypotension Neg Hx   . Malignant hyperthermia Neg Hx   . Pseudochol deficiency Neg Hx   . Stomach cancer Neg Hx    SOCIAL HISTORY: History  Substance Use Topics  . Smoking status: Former Smoker -- 2.00 packs/day for 30 years    Types: Cigarettes  . Smokeless tobacco: Never Used     Comment: quit smoking in 1989  . Alcohol Use: No   No Known Allergies Current Outpatient Prescriptions  Medication Sig Dispense Refill  . acetaminophen (TYLENOL) 500 MG tablet Take 1,000 mg by mouth daily as needed (pain).    Marland Kitchen doxycycline (VIBRAMYCIN) 100 MG capsule Take 1 capsule (100 mg total) by mouth 2 (two) times daily. 14 capsule 0  . ELIQUIS 5 MG TABS tablet TAKE 1 TABLET TWICE A DAY 180 tablet 0  . furosemide (LASIX) 40 MG tablet TAKE 1 TABLET DAILY 90 tablet 0  . KLOR-CON M20 20 MEQ tablet TAKE 1 TABLET DAILY 90 tablet 0  . meclizine (ANTIVERT) 25 MG tablet TAKE 1 TABLET 3 TIMES A DAY AS NEEDED FOR DIZZINESS 30 tablet 1  .  omeprazole (PRILOSEC OTC) 20 MG tablet Take 2 tablets (40 mg total) by mouth 2 (two) times daily. 120 tablet 0  . pravastatin (PRAVACHOL) 40 MG tablet TAKE 1 TABLET DAILY 90 tablet 1  . sulfamethoxazole-trimethoprim (BACTRIM DS,SEPTRA DS) 800-160 MG per tablet Take 1 tablet by mouth 2 (two) times daily. 8 tablet 0  . tiotropium (SPIRIVA HANDIHALER) 18 MCG inhalation capsule Place 1 capsule (18 mcg total) into inhaler and inhale daily. 90 capsule 2  . traMADol (ULTRAM) 50 MG tablet Take 1 tablet (50 mg total) by mouth every 12 (twelve) hours as needed (pain). 60 tablet 0   No current facility-administered medications for this visit.   REVIEW OF SYSTEMS: Valu.Nieves ] denotes positive finding; [  ] denotes negative finding  CARDIOVASCULAR:  [ ]  chest pain   [ ]  chest pressure   [ ]  palpitations   [ ]  orthopnea   Valu.Nieves ] dyspnea on exertion   Valu.Nieves ] claudication   [ ]  rest pain   [ ]  DVT    [ ]  phlebitis PULMONARY:   [ ]  productive cough   [ ]  asthma   [ ]  wheezing NEUROLOGIC:   [ ]  weakness  [ ]  paresthesias  [ ]  aphasia  [ ]  amaurosis  Valu.Nieves ] dizziness HEMATOLOGIC:   [ ]  bleeding problems   [ ]  clotting disorders MUSCULOSKELETAL:  [ ]  joint pain   [ ]  joint swelling [ ]  leg swelling GASTROINTESTINAL: [ ]   blood in stool  [ ]   hematemesis GENITOURINARY:  [ ]   dysuria  [ ]   hematuria PSYCHIATRIC:  [ ]  history of major depression INTEGUMENTARY:  [ ]  rashes  [ ]  ulcers CONSTITUTIONAL:  [ ]  fever   [ ]  chills  PHYSICAL EXAM: Filed Vitals:   04/14/14 1029 04/14/14 1031  BP: 181/75 141/82  Pulse: 73   Height: 5\' 7"  (1.702 m)   Weight: 216 lb (97.977 kg)   SpO2: 98%    Body mass index is 33.82 kg/(m^2). GENERAL: The patient is a well-nourished male, in no acute distress. The vital signs are documented above. CARDIOVASCULAR: There is a regular rate and rhythm. He has bilateral carotid bruits. PULMONARY: There is good air exchange bilaterally without wheezing or rales. ABDOMEN: Soft and non-tender with normal pitched bowel sounds.  MUSCULOSKELETAL: There are no major deformities or cyanosis. NEUROLOGIC: No focal weakness or paresthesias are detected. SKIN: There are no ulcers or rashes noted. PSYCHIATRIC: The patient has a normal affect.  DATA:  I have independently interpreted his carotid duplex scan today which shows that his right carotid endarterectomy site is widely patent without evidence of restenosis. He has a 40-59% left internal carotid artery stenosis. This has not changed in the last year.  MEDICAL ISSUES:  Carotid stenosis The patient is doing well status post right carotid endarterectomy in 2012. There is no evidence of restenosis. We are following a moderate left carotid stenosis which is stable at 40-59%. I've ordered a follow up duplex can in 1 year and I'll see him back at that time. He knows to call sooner if he has problems. He is not on aspirin because of  a medical contraindication. He is on a statin.     Return in about 1 year (around 04/15/2015).   [place] [facility] [place] Beeper: [phone]

## 2014-05-03 ENCOUNTER — Other Ambulatory Visit: Payer: Self-pay | Admitting: Cardiovascular Disease

## 2014-05-05 NOTE — Progress Notes (Signed)
Cardiology Office Note   Date:  05/06/2014   ID:  Fernando Moyer, Fernando Moyer January 18, 1934, MRN [id number]  PCP:  [name], MD  Cardiologist:  [name], MD    Chief Complaint  Patient presents with  . Shortness of Breath     History of Present Illness: Fernando Moyer is a 81 y.o. male who presents for follow-up of aortic valve disease. The patient has undergone bioprosthetic aortic valve replacement in 2013. He's also followed for hypertension and paroxysmal atrial fibrillation and is maintained on chronic anticoagulation with pradaxa. Last echocardiogram in July 2013 showed normal left ventricular systolic function and normal function of his aortic valve bioprosthesis. He ws noted to have progressive shortness of breath when last seen in August 2015 and an echo at that time showed normal LV systolic function and normal function of his aortic valve bioprosthesis.   He states 'I feel terrible.' Reports dyspnea with exertion at low-level. He has to rest walking to his mailbox. Also has had pressure in his chest, but not always related to physical exertion. Describes as substernal pressure, non-radiating. He also is limited by hip pain. Reports progressive fatigue. No recent issues with lightheadedness, dizziness, or syncope. He has rare palpitations. He denies orthopnea, PND, or leg swelling. He's tolerating Eliquis without bleeding problems.   Past Medical History  Diagnosis Date  . GERD with stricture     w/hx of stricture  . PVD (peripheral vascular disease) 12/07    per cath 12/07....iliac  . Carotid artery occlusion     last u/s 3-11...Marland Kitchenper vasc.surgery 2007 cath: nonobstructive CAD. mil;d MS, trivial AoS, small AAA,  . Bowen's disease     right arm BX: referred to dermatology  . Hyperlipidemia     takes Pravastatin daily  . AAA (abdominal aortic aneurysm) 05/2008    PER CARDIAC CATH  . Aortic stenosis 05/2008    PER CARDIAC CATH, MILD, MITRAL STENOSIS  . Mitral stenosis 05/2008      PER CARDIAC CATH  . Coronary artery disease 05/2008    MILD, MEDICAL MANAGEMENT  . Pulmonary hypertension   . Dyspnea      w/u included a cath and PFT's, sxms thought to be pulmonary  . Chronic back pain   . Peripheral neuropathy     right thigh; "it's been that way for years"  . Diabetes mellitus 09/07/2011  . Benign neoplasm of colon   . Stricture and stenosis of esophagus   . Myocardial infarction 2012  . COPD (chronic obstructive pulmonary disease)   . Dizziness     takes Meclizine daily  . Back pain     cause unknown but does get Cortisone injection every now and then    Past Surgical History  Procedure Laterality Date  . Appendectomy    . Esophageal dilation    . Cardiac catheterization  02/2011    LAD 50, CFX OK, RCA40, EF 55%; PCWP 17, AoV  0.9 cm squared  . Endarterectomy  08-2010    Right carotid endarterectomy    . Aortic valve replacement  08/17/2011    Procedure: AORTIC VALVE REPLACEMENT (AVR);  Surgeon: Gaye [place], MD;  Location: [facility];  Service: Open Heart Surgery;  Laterality: N/A;  . Colonoscopy    . Sternal wires removal N/A 05/01/2013    Procedure: STERNAL WIRES REMOVAL;  Surgeon: Gaye [place], MD;  Location: [facility] OR;  Service: Thoracic;  Laterality: N/A;  . Left and right heart catheterization with coronary angiogram  N/A 02/23/2011    Procedure: LEFT AND RIGHT HEART CATHETERIZATION WITH CORONARY ANGIOGRAM;  Surgeon: [name], MD;  Location: [facility] CATH LAB;  Service: Cardiovascular;  Laterality: N/A;    Current Outpatient Prescriptions  Medication Sig Dispense Refill  . acetaminophen (TYLENOL) 500 MG tablet Take 1,000 mg by mouth daily as needed (pain).    Marland Kitchen apixaban (ELIQUIS) 5 MG TABS tablet Take 5 mg by mouth 2 (two) times daily.    Marland Kitchen doxycycline (VIBRAMYCIN) 100 MG capsule Take 1 capsule (100 mg total) by mouth 2 (two) times daily. 14 capsule 0  . furosemide (LASIX) 40 MG tablet Take 40 mg by mouth daily.    . meclizine (ANTIVERT) 25 MG tablet  TAKE 1 TABLET 3 TIMES A DAY AS NEEDED FOR DIZZINESS 30 tablet 1  . potassium chloride SA (K-DUR,KLOR-CON) 20 MEQ tablet Take 20 mEq by mouth daily.    . pravastatin (PRAVACHOL) 40 MG tablet Take 40 mg by mouth daily.    Marland Kitchen sulfamethoxazole-trimethoprim (BACTRIM DS,SEPTRA DS) 800-160 MG per tablet Take 1 tablet by mouth 2 (two) times daily. 8 tablet 0  . tiotropium (SPIRIVA) 18 MCG inhalation capsule Place 18 mcg into inhaler and inhale daily. Place one capsule into inhaler and inhale into lungs once daily    . traMADol (ULTRAM) 50 MG tablet Take 1 tablet (50 mg total) by mouth every 12 (twelve) hours as needed (pain). 60 tablet 0   No current facility-administered medications for this visit.    Allergies:   Review of patient's allergies indicates no known allergies.   Social History:  The patient  reports that he has quit smoking. His smoking use included Cigarettes. He has a 60 pack-year smoking history. He has never used smokeless tobacco. He reports that he does not drink alcohol or use illicit drugs.   Family History:  The patient's family history includes Heart attack (age of onset: 32) in his father. There is no history of Diabetes, Colon cancer, Prostate cancer, Anesthesia problems, Hypotension, Malignant hyperthermia, Pseudochol deficiency, or Stomach cancer.    ROS:  Please see the history of present illness.  Otherwise, review of systems is positive for hearing loss, exertional dyspnea, back pain.  All other systems are reviewed and negative.    PHYSICAL EXAM: VS:  BP 134/58 mmHg  Pulse 101  Ht 5\' 7"  (1.702 m)  Wt 214 lb 1.9 oz (97.124 kg)  BMI 33.53 kg/m2  SpO2 97% , BMI Body mass index is 33.53 kg/(m^2). GEN: Well nourished, well developed, elderly male in no acute distress HEENT: normal Neck: no JVD. There are bilateral carotid bruits Cardiac: RRR a soft systolic ejection murmur at the right upper sternal border                No peripheral edema Respiratory:  clear to  auscultation bilaterally, normal work of breathing, prolonged expiratory phase GI: soft, nontender, nondistended, + BS MS: no deformity or atrophy Skin: warm and dry, no rash Neuro:  Strength and sensation are intact Psych: euthymic mood, full affect  EKG:  EKG is ordered today. The ekg ordered today shows NSR 89 bpm, RBBB and LAFB, no significant change from previous tracing.  Recent Labs: 07/06/2013: TSH 1.72 02/01/2014: BUN 8; Creatinine 1.1; Potassium 4.3; Sodium 137   Lipid Panel     Component Value Date/Time   CHOL 139 07/06/2013 0818   TRIG 219.0* 07/06/2013 0818   TRIG 96 03/20/2006 0945   HDL 31.00* 07/06/2013 0818   CHOLHDL 4  07/06/2013 0818   VLDL 43.8* 07/06/2013 0818   LDLCALC 64 07/06/2013 0818   LDLDIRECT 120.3 02/01/2006 1001      Wt Readings from Last 3 Encounters:  05/06/14 214 lb 1.9 oz (97.124 kg)  04/14/14 216 lb (97.977 kg)  03/15/14 215 lb 9.6 oz (97.796 kg)     Cardiac Studies Reviewed: 2D Echo 12/04/13: Study Conclusions  - Left ventricle: The cavity size was normal. There was moderate concentric hypertrophy. Systolic function was vigorous. The estimated ejection fraction was in the range of 65% to 70%. Wall motion was normal; there were no regional wall motion abnormalities. Doppler parameters are consistent with high ventricular filling pressure. - Aortic valve: A bioprosthesis was present. There was trivial regurgitation. Peak velocity (S): 291 cm/s. Within normal range (AVR). - Mitral valve: Moderately calcified annulus. - Left atrium: The atrium was mildly dilated.  ASSESSMENT AND PLAN: 1. Coronary artery disease, native vessel. The patient's coronary disease has been managed medically. He's had mild - moderate CAD and he has no anginal symptoms. Most recent heart catheterization data reviewed. He has had 3 cardiac catheterizations with stable CAD in a nonobstructive pattern. I do not think obstructive coronary disease is  contributing to his shortness of breath.  2. Aortic stenosis status post aortic valve replacement with a bioprosthetic valve. Last echo reviewed. Appears to have normal valve function. Continue with SBE prophylaxis.   3. Peripheral arterial disease with abdominal aortic aneurysm, iliac occlusion, and carotid stenosis. Will repeat an abdominal aortic ultrasound. His carotid disease is followed by Dr. Scot Dock. He does not appear to have lifestyle limiting claudication symptoms.  4. Paroxysmal atrial fibrillation. The patient continues on chronic anticoagulation with Eliquis. He reports no bleeding problems.  5. Hyperlipidemia: Lipids as above. He is at goal. Continue pravastatin.  6. Shortness of breath with exertion. This is a long-standing complaint. I went back and reviewed his pulmonary office notes from 2010. I also reviewed his PFTs. He has had mild obstruction with reduced diffusion capacity. I suspect this is multifactorial with his history of valvular heart disease, obesity, and mild lung disease. I think deconditioning probably plays a significant role. We discussed further evaluation. The patient is unable to do an exercise treadmill or cardiopulmonary exercise test because of limitations related to arthritis. I do not think cardiac cath is warranted as per discussion above. There are no signs of congestive heart failure on exam or based on his symptoms. Would continue observation at the present time.  Current medicines are reviewed with the patient today.  The patient does not have concerns regarding medicines.  The following changes have been made:  no change  Labs/ tests ordered today include:   Orders Placed This Encounter  Procedures  . EKG 12-Lead    Disposition:   FU 6 months  Signed, [name], MD  05/06/2014 11:33 AM    [facility] [address], [place], [place]  [address] Phone: [phone]; Fax: [phone]

## 2014-05-06 ENCOUNTER — Ambulatory Visit (INDEPENDENT_AMBULATORY_CARE_PROVIDER_SITE_OTHER): Payer: Medicare Other | Admitting: Cardiovascular Disease

## 2014-05-06 ENCOUNTER — Encounter: Payer: Self-pay | Admitting: Cardiovascular Disease

## 2014-05-06 VITALS — BP 134/58 | HR 101 | Ht 67.0 in | Wt 214.1 lb

## 2014-05-06 DIAGNOSIS — R079 Chest pain, unspecified: Secondary | ICD-10-CM | POA: Diagnosis not present

## 2014-05-06 DIAGNOSIS — I359 Nonrheumatic aortic valve disorder, unspecified: Secondary | ICD-10-CM | POA: Diagnosis not present

## 2014-05-06 DIAGNOSIS — I6523 Occlusion and stenosis of bilateral carotid arteries: Secondary | ICD-10-CM | POA: Diagnosis not present

## 2014-05-06 NOTE — Patient Instructions (Signed)
Your physician wants you to follow-up in: 6 MONTHS with Dr Cooper.  You will receive a reminder letter in the mail two months in advance. If you don't receive a letter, please call our office to schedule the follow-up appointment.  Your physician recommends that you continue on your current medications as directed. Please refer to the Current Medication list given to you today.  

## 2014-06-01 ENCOUNTER — Telehealth: Payer: Self-pay | Admitting: Cardiovascular Disease

## 2014-06-01 NOTE — Telephone Encounter (Signed)
New message     Request for surgical clearance:  What type of surgery is being performed? Lumbar epidural steroid injection 1. When is this surgery scheduled? 06-15-14  2. Are there any medications that need to be held prior to surgery and how long? eliquis  3. Name of physician performing surgery? Dr Nelva Bush  4. What is your office phone and fax number? Fax [phone]

## 2014-06-01 NOTE — Telephone Encounter (Signed)
Hold x 3 days prior to procedure. Resume when ok with Dr Nelva Bush. thx

## 2014-06-03 NOTE — Telephone Encounter (Signed)
Telephone encounter faxed to [phone].

## 2014-06-15 ENCOUNTER — Other Ambulatory Visit: Payer: Self-pay | Admitting: Internal Medicine

## 2014-06-15 DIAGNOSIS — M5136 Other intervertebral disc degeneration, lumbar region: Secondary | ICD-10-CM | POA: Diagnosis not present

## 2014-06-16 ENCOUNTER — Telehealth: Payer: Self-pay | Admitting: *Deleted

## 2014-06-16 ENCOUNTER — Encounter: Payer: Self-pay | Admitting: *Deleted

## 2014-06-16 MED ORDER — TRAMADOL HCL 50 MG PO TABS: 50.0000 mg | ORAL_TABLET | Freq: Two times a day (BID) | ORAL | Status: DC | PRN

## 2014-06-16 NOTE — Telephone Encounter (Signed)
Print #60

## 2014-06-16 NOTE — Telephone Encounter (Signed)
Unable to reach patient at time of Pre-Visit call.  Left message on voicemail to return call when available.

## 2014-06-16 NOTE — Telephone Encounter (Signed)
Pt is requesting refill on Tramadol.  Last OV: 02/01/2014  Last Fill: 03/24/2014 # [id number] UDS: None  Please advise.

## 2014-06-16 NOTE — Telephone Encounter (Signed)
Faxed to [facility] pharmacy.

## 2014-06-16 NOTE — Telephone Encounter (Signed)
Rx printed, awaiting signature by Dr. Paz.  

## 2014-06-16 NOTE — Addendum Note (Signed)
Addended by: Leticia Penna A on: 06/16/2014 02:50 PM   Modules accepted: Orders, Medications

## 2014-06-17 ENCOUNTER — Encounter: Payer: Self-pay | Admitting: Internal Medicine

## 2014-06-17 ENCOUNTER — Ambulatory Visit (INDEPENDENT_AMBULATORY_CARE_PROVIDER_SITE_OTHER): Payer: Medicare Other | Admitting: Internal Medicine

## 2014-06-17 VITALS — BP 124/78 | HR 79 | Temp 97.6°F | Ht 67.0 in | Wt 212.1 lb

## 2014-06-17 DIAGNOSIS — R7989 Other specified abnormal findings of blood chemistry: Secondary | ICD-10-CM

## 2014-06-17 DIAGNOSIS — Z23 Encounter for immunization: Secondary | ICD-10-CM | POA: Diagnosis not present

## 2014-06-17 DIAGNOSIS — Z125 Encounter for screening for malignant neoplasm of prostate: Secondary | ICD-10-CM | POA: Diagnosis not present

## 2014-06-17 DIAGNOSIS — E785 Hyperlipidemia, unspecified: Secondary | ICD-10-CM

## 2014-06-17 DIAGNOSIS — I25709 Atherosclerosis of coronary artery bypass graft(s), unspecified, with unspecified angina pectoris: Secondary | ICD-10-CM | POA: Diagnosis not present

## 2014-06-17 DIAGNOSIS — Z0000 Encounter for general adult medical examination without abnormal findings: Secondary | ICD-10-CM | POA: Diagnosis not present

## 2014-06-17 DIAGNOSIS — I739 Peripheral vascular disease, unspecified: Secondary | ICD-10-CM

## 2014-06-17 DIAGNOSIS — I25810 Atherosclerosis of coronary artery bypass graft(s) without angina pectoris: Secondary | ICD-10-CM

## 2014-06-17 LAB — LIPID PANEL
CHOLESTEROL: 144 mg/dL (ref 0–200)
HDL: 33.5 mg/dL — AB (ref >39.00)
LDL Cholesterol: 78 mg/dL (ref 0–99)
NonHDL: 110.5
TRIGLYCERIDES: 165 mg/dL — AB (ref 0.0–149.0)
Total CHOL/HDL Ratio: 4
VLDL: 33 mg/dL (ref 0.0–40.0)

## 2014-06-17 LAB — TSH: TSH: 4.54 u[IU]/mL — ABNORMAL HIGH (ref 0.35–4.50)

## 2014-06-17 LAB — BASIC METABOLIC PANEL
BUN: 14 mg/dL (ref 6–23)
CALCIUM: 9.5 mg/dL (ref 8.4–10.5)
CHLORIDE: 102 meq/L (ref 96–112)
CO2: 30 mEq/L (ref 19–32)
CREATININE: 0.95 mg/dL (ref 0.40–1.50)
GFR: 81.29 mL/min (ref >60.00)
Glucose, Bld: 108 mg/dL — ABNORMAL HIGH (ref 70–99)
POTASSIUM: 4.2 meq/L (ref 3.5–5.1)
Sodium: 139 mEq/L (ref 135–145)

## 2014-06-17 LAB — ALT: ALT: 8 U/L (ref 0–53)

## 2014-06-17 LAB — PSA: PSA: 3.56 ng/mL (ref 0.10–4.00)

## 2014-06-17 LAB — AST: AST: 21 U/L (ref 0–37)

## 2014-06-17 NOTE — Progress Notes (Signed)
Subjective:    Patient ID: Fernando Moyer, male    DOB: 11-Sep-1933, 81 y.o.   MRN: [id number]  DOS:  06/17/2014 Type of visit - description :  Here for Medicare AWV:  1. Risk factors based on Past M, S, F history: reviewed 2. Physical Activities:  Sedentary x last month d/t back pain, plans to go back to exercise  when possible 3. Depression/mood: neg screening, did lost his 66 y/o brother   4. Hearing:  Patient got [employer] hearing aids 09/18/13  5. ADL's: independent, drives , still works  68. Fall Risk: no recent falls, prevention discussed  7. home Safety: does feel safe at home  8. Height, weight, & visual acuity: see VS,  cataract surgery 10/14/13 on L eye,  Sees Dr. Bing Plume  9. Counseling: provided 10. Labs ordered based on risk factors: if needed  11. Referral Coordination: if needed 12. Care Plan, see assessment and plan , written personalized plan provided  13. Cognitive Assessment: motor skills and cognition appropriate for age 14. Care team updated 15. End-of-life care, already d/w family, rec to bring a copy of the POA  In addition, today we discussed the following: CAD, saw cardiology recently, felt to be stable, difficulty breathing felt to be multifactorial. Having problems with back pain, request a handicap parking which will be provided. Sees Dr. Nelva Bush. COPD, good compliance with the Spiriva, symptoms are at baseline. Peripheral vascular disease, on eliquis, no apparent complications. See review of systems Hypertension good compliance with medicines. High cholesterol: Statins, good compliance.    Review of Systems Constitutional: No fever, chills. No unexplained wt changes. No unusual sweats HEENT: No dental problems, ear discharge, facial swelling, voice changes. No eye discharge, redness or intolerance to light Respiratory: No wheezing or difficulty breathing. + cough and minimal  mucus production (at baseline ), no hemoptysis Cardiovascular: No CP, leg swelling or  palpitations GI: no nausea, vomiting, diarrhea or abdominal pain.  No blood in the stools. No dysphagia   Endocrine: No polyphagia, polyuria or polydipsia GU: No dysuria, gross hematuria, difficulty urinating. No urinary urgency , occ  frequency. Musculoskeletal: No joint swellings , + back pain Skin: No change in the color of the skin, palor or rash Allergic, immunologic: No environmental allergies or food allergies Neurological: + dizziness , less than baseline; no syncope. No headaches. No diplopia, slurred speech, motor deficits, facial numbness Hematological: No enlarged lymph nodes, easy bruising or bleeding Psychiatry: No suicidal ideas, hallucinations, behavior problems or confusion. No unusual/severe anxiety or depression.     Past Medical History  Diagnosis Date  . GERD with stricture     w/hx of stricture  . PVD (peripheral vascular disease) 12/07    per cath 12/07....iliac  . Carotid artery occlusion     last u/s 3-11...Marland Kitchenper vasc.surgery 2007 cath: nonobstructive CAD. mil;d MS, trivial AoS, small AAA,  . Bowen's disease     right arm BX: referred to dermatology  . Hyperlipidemia     takes Pravastatin daily  . AAA (abdominal aortic aneurysm) 05/2008    PER CARDIAC CATH  . Aortic stenosis 05/2008    PER CARDIAC CATH, MILD, MITRAL STENOSIS  . Mitral stenosis 05/2008    PER CARDIAC CATH  . Coronary artery disease 05/2008    MILD, MEDICAL MANAGEMENT  . Pulmonary hypertension   . Dyspnea      w/u included a cath and PFT's, sxms thought to be pulmonary  . Chronic back pain   . Peripheral  neuropathy     right thigh; "it's been that way for years"  . Diabetes mellitus 09/07/2011  . Benign neoplasm of colon   . Stricture and stenosis of esophagus   . Myocardial infarction 2012  . COPD (chronic obstructive pulmonary disease)   . Dizziness     takes Meclizine daily  . Back pain     cause unknown but does get Cortisone injection every now and then    Past Surgical History    Procedure Laterality Date  . Appendectomy    . Esophageal dilation    . Cardiac catheterization  02/2011    LAD 50, CFX OK, RCA40, EF 55%; PCWP 17, AoV  0.9 cm squared  . Endarterectomy  08-2010    Right carotid endarterectomy    . Aortic valve replacement  08/17/2011    Procedure: AORTIC VALVE REPLACEMENT (AVR);  Surgeon: Gaye Pollack, MD;  Location: [place];  Service: Open Heart Surgery;  Laterality: N/A;  . Colonoscopy    . Sternal wires removal N/A 05/01/2013    Procedure: STERNAL WIRES REMOVAL;  Surgeon: Gaye Pollack, MD;  Location: [place] [facility];  Service: Thoracic;  Laterality: N/A;  . Left and right heart catheterization with coronary angiogram N/A 02/23/2011    Procedure: LEFT AND RIGHT HEART CATHETERIZATION WITH CORONARY ANGIOGRAM;  Surgeon: [name], MD;  Location: [facility] CATH LAB;  Service: Cardiovascular;  Laterality: N/A;  . Eye surgery  10/14/13    cataract surgery L eye     History   Social History  . Marital Status: Widowed    Spouse Name: N/A  . Number of Children: 2  . Years of Education: N/A   Occupational History  . 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 Smoking status: Former Smoker -- 2.00 packs/day for 30 years    Types: Cigarettes  . Smokeless tobacco: Never Used     Comment: quit smoking in 1989  . Alcohol Use: No  . Drug Use: No  . Sexual Activity: No   Other Topics Concern  . Not on file   Social History Narrative   LIVES BY HIMSELF    HAS 2 GROWN SONS, 5 G-KIDS (Melissa usually comes with him for his appointments)            Family History  Problem Relation Age of Onset  . Heart attack Father 71    MI  . Diabetes Neg Hx   . Colon cancer Neg Hx   . Prostate cancer Brother 48  . Anesthesia problems Neg Hx   . Hypotension Neg Hx   . Malignant hyperthermia Neg Hx   . Pseudochol deficiency Neg Hx   . Stomach cancer Neg Hx        Medication List        This list is accurate as of: 06/17/14 11:59 PM.  Always use your most recent med list.               acetaminophen 500 MG tablet  Commonly known as:  TYLENOL  Take 1,000 mg by mouth daily as needed (pain).     apixaban 5 MG Tabs tablet  Commonly known as:  ELIQUIS  Take 5 mg by mouth 2 (two) times daily.     furosemide 40 MG tablet  Commonly known as:  LASIX  Take 40 mg by mouth daily.  meclizine 25 MG tablet  Commonly known as:  ANTIVERT  TAKE 1 TABLET 3 TIMES A DAY AS NEEDED FOR DIZZINESS     omeprazole 40 MG capsule  Commonly known as:  PRILOSEC  Take 40 mg by mouth 2 (two) times daily.     potassium chloride SA 20 MEQ tablet  Commonly known as:  K-DUR,KLOR-CON  Take 20 mEq by mouth daily.     pravastatin 40 MG tablet  Commonly known as:  PRAVACHOL  Take 40 mg by mouth daily.     tiotropium 18 MCG inhalation capsule  Commonly known as:  SPIRIVA  Place 18 mcg into inhaler and inhale daily. Place one capsule into inhaler and inhale into lungs once daily     traMADol 50 MG tablet  Commonly known as:  ULTRAM  Take 1 tablet (50 mg total) by mouth every 12 (twelve) hours as needed (pain).           Objective:   Physical Exam BP 124/78 mmHg  Pulse 79  Temp(Src) 97.6 F (36.4 C) (Oral)  Ht 5\' 7"  (1.702 m)  Wt 212 lb 2 oz (96.219 kg)  BMI 33.22 kg/m2  SpO2 99%  General:   Well developed, well nourished . NAD.  HEENT:  Normocephalic . Face symmetric, atraumatic Lungs:  Decreased breath sounds but clear Normal respiratory effort, no intercostal retractions, no accessory muscle use. Heart: RRR,  soft systolic murmur.  Abdomen:  Not distended, soft, non-tender. No rebound or rigidity. No mass,organomegaly. No bruit Muscle skeletal: no pretibial edema bilaterally  Skin: Not pale. Not jaundice Rectal:  External abnormalities: none. Normal sphincter tone. No rectal masses or tenderness.  Stool brown  Prostate: Prostate gland firm and smooth, ++  enlargement, no nodularity, tenderness, mass, asymmetry or induration.  Neurologic:  alert & oriented X3.  Speech normal, gait appropriate for age and unassisted Psych--  Cognition and judgment appear intact.  Cooperative with normal attention span and concentration.  Behavior appropriate. No anxious or depressed appearing.      Assessment & Plan:     CAD, on medical management, asymptomatic. History of paroxysmal atrial fibrillation,   Heart seems regular today Hyperlipidemia, check FLP today. Continue statins. COPD, oligosymptomatic, has chronic short of breath believed to be multifactorial (see last cards note) Hypertension, BP seems well-controlled, check a BMP. Peripheral vascular disease, per cardiology and vascular surgery. Skin cancer, sees the dermatologist Back pain, per Dr. Nelva Bush, handicap parking provided.

## 2014-06-17 NOTE — Patient Instructions (Signed)
Get your blood work before you leave   Come back to the office 4  for a routine check up    [place] cause injuries and can affect all age groups. It is possible to use preventive measures to significantly decrease the likelihood of falls. There are many simple measures which can make your home safer and prevent falls. OUTDOORS  Repair cracks and edges of walkways and driveways.  Remove high doorway thresholds.  Trim shrubbery on the main path into your home.  Have good outside lighting.  Clear walkways of tools, rocks, debris, and clutter.  Check that handrails are not broken and are securely fastened. Both sides of steps should have handrails.  Have leaves, snow, and ice cleared regularly.  Use sand or salt on walkways during winter months.  In the garage, clean up grease or oil spills. BATHROOM  Install night lights.  Install grab bars by the toilet and in the tub and shower.  Use non-skid mats or decals in the tub or shower.  Place a plastic non-slip stool in the shower to sit on, if needed.  Keep floors dry and clean up all water on the floor immediately.  Remove soap buildup in the tub or shower on a regular basis.  Secure bath mats with non-slip, double-sided rug tape.  Remove throw rugs and tripping hazards from the floors. BEDROOMS  Install night lights.  Make sure a bedside light is easy to reach.  Do not use oversized bedding.  Keep a telephone by your bedside.  Have a firm chair with side arms to use for getting dressed.  Remove throw rugs and tripping hazards from the floor. KITCHEN  Keep handles on pots and pans turned toward the center of the stove. Use back burners when possible.  Clean up spills quickly and allow time for drying.  Avoid walking on wet floors.  Avoid hot utensils and knives.  Position shelves so they are not too high or low.  Place commonly used objects within easy reach.  If necessary,  use a sturdy step stool with a grab bar when reaching.  Keep electrical cables out of the way.  Do not use floor polish or wax that makes floors slippery. If you must use wax, use non-skid floor wax.  Remove throw rugs and tripping hazards from the floor. STAIRWAYS  Never leave objects on stairs.  Place handrails on both sides of stairways and use them. Fix any loose handrails. Make sure handrails on both sides of the stairways are as long as the stairs.  Check carpeting to make sure it is firmly attached along stairs. Make repairs to worn or loose carpet promptly.  Avoid placing throw rugs at the top or bottom of stairways, or properly secure the rug with carpet tape to prevent slippage. Get rid of throw rugs, if possible.  Have an electrician put in a light switch at the top and bottom of the stairs. OTHER FALL PREVENTION TIPS  Wear low-heel or rubber-soled shoes that are supportive and fit well. Wear closed toe shoes.  When using a stepladder, make sure it is fully opened and both spreaders are firmly locked. Do not climb a closed stepladder.  Add color or contrast paint or tape to grab bars and handrails in your home. Place contrasting color strips on first and last steps.  Learn and use mobility aids as needed. Install an electrical emergency response system.  Turn on lights to avoid dark areas. Replace  light bulbs that burn out immediately. Get light switches that glow.  Arrange furniture to create clear pathways. Keep furniture in the same place.  Firmly attach carpet with non-skid or double-sided tape.  Eliminate uneven floor surfaces.  Select a carpet pattern that does not visually hide the edge of steps.  Be aware of all pets. OTHER HOME SAFETY TIPS  Set the water temperature for 120 F (48.8 C).  Keep emergency numbers on or near the telephone.  Keep smoke detectors on every level of the home and near sleeping areas. Document Released: 03/09/2002 Document  Revised: 09/18/2011 Document Reviewed: 06/08/2011 [facility] Patient Information 2015 [place], [place]. This information is not intended to replace advice given to you by your health care provider. Make sure you discuss any questions you have with your health care provider.   Preventive Care for Adults Ages 75 and over  Blood pressure check.** / Every 1 to 2 years.  Lipid and cholesterol check.**/ Every 5 years beginning at age 56.  Lung cancer screening. / Every year if you are aged 69-80 years and have a 30-pack-year history of smoking and currently smoke or have quit within the past 15 years. Yearly screening is stopped once you have quit smoking for at least 15 years or develop a health problem that would prevent you from having lung cancer treatment.  Fecal occult blood test (FOBT) of stool. / Every year beginning at age 69 and continuing until age 53. You may not have to do this test if you get a colonoscopy every 10 years.  Flexible sigmoidoscopy** or colonoscopy.** / Every 5 years for a flexible sigmoidoscopy or every 10 years for a colonoscopy beginning at age 74 and continuing until age 75.  Hepatitis C blood test.** / For all people born from [birth year] through 1963 and any individual with known risks for hepatitis C.  Abdominal aortic aneurysm (AAA) screening.** / A one-time screening for ages 24 to 25 years who are current or former smokers.  Skin self-exam. / Monthly.  Influenza vaccine. / Every year.  Tetanus, diphtheria, and acellular pertussis (Tdap/Td) vaccine.** / 1 dose of Td every 10 years.  Varicella vaccine.** / Consult your health care provider.  Zoster vaccine.** / 1 dose for adults aged 88 years or older.  Pneumococcal 13-valent conjugate (PCV13) vaccine.** / Consult your health care provider.  Pneumococcal polysaccharide (PPSV23) vaccine.** / 1 dose for all adults aged 55 years and older.  Meningococcal vaccine.** / Consult your health care provider.  Hepatitis  A vaccine.** / Consult your health care provider.  Hepatitis B vaccine.** / Consult your health care provider.  Haemophilus influenzae type b (Hib) vaccine.** / Consult your health care provider. **Family history and personal history of risk and conditions may change your health care provider's recommendations. Document Released: 05/15/2001 Document Revised: 03/24/2013 Document Reviewed: 08/14/2010 [facility] Patient Information 2015 [place], [place]. This information is not intended to replace advice given to you by your health care provider. Make sure you discuss any questions you have with your health care provider.

## 2014-06-17 NOTE — Progress Notes (Signed)
Pre visit review using our clinic review tool, if applicable. No additional management support is needed unless otherwise documented below in the visit note. 

## 2014-06-17 NOTE — Assessment & Plan Note (Addendum)
Immunization Status: Flu vaccine--02/01/14 Tdap--01/17/11 PNA--12/07/11 Prevnar-- today Shingles--09/30/13  CCS: 11.21.14 with Verl Blalock at [facility] Endo- sessile polyp removed (recommended f/u 11/17) Prostate cancer screening , last PSA: 02/17/11- 3.68 We discussed the pros and cons of screening, she was hesitant but at the same time concerned about his family history, younger brother has prostate cancer. We elected to the right DRE, PSA. DRE show a large prostate, other than occasional urinary frequency he is asymptomatic. Diet and exercise discussed

## 2014-07-06 ENCOUNTER — Telehealth: Payer: Self-pay | Admitting: Cardiovascular Disease

## 2014-07-06 NOTE — Telephone Encounter (Signed)
Pt is at low risk of holding Eliquis for 3 days as outlined for lumbar epidural injection. thx

## 2014-07-06 NOTE — Telephone Encounter (Signed)
This note was faxed to [phone].

## 2014-07-06 NOTE — Telephone Encounter (Signed)
New message     Request for surgical clearance:  1. What type of surgery is being performed? Lumbar epidural steroid injection  When is this surgery scheduled? 4-13- Are there any medications that need to be held prior to surgery and how long?stop eliquis 3 days prior 2. Name of physician performing surgery? Dr Nelva Bush  3. What is your office phone and fax number? Fax [phone]

## 2014-07-12 ENCOUNTER — Telehealth: Payer: Self-pay | Admitting: Internal Medicine

## 2014-07-12 NOTE — Telephone Encounter (Signed)
LMOM informing Pt of lab results, informed him to call or consult MyChart if he has any further questions.

## 2014-07-12 NOTE — Telephone Encounter (Signed)
Relation to pt: self Call back number: [phone]   Reason for call:  Pt states he did not view he's lab results on my chart and received a missed call 07/09/14 Friday. Pt would like a follow up call. Please advise

## 2014-07-13 DIAGNOSIS — H2513 Age-related nuclear cataract, bilateral: Secondary | ICD-10-CM | POA: Diagnosis not present

## 2014-07-13 LAB — HM DIABETES EYE EXAM

## 2014-07-14 DIAGNOSIS — M5136 Other intervertebral disc degeneration, lumbar region: Secondary | ICD-10-CM | POA: Diagnosis not present

## 2014-07-15 ENCOUNTER — Other Ambulatory Visit: Payer: Self-pay | Admitting: Internal Medicine

## 2014-07-22 DIAGNOSIS — L570 Actinic keratosis: Secondary | ICD-10-CM | POA: Diagnosis not present

## 2014-07-22 DIAGNOSIS — Z08 Encounter for follow-up examination after completed treatment for malignant neoplasm: Secondary | ICD-10-CM | POA: Diagnosis not present

## 2014-07-22 DIAGNOSIS — Z85820 Personal history of malignant melanoma of skin: Secondary | ICD-10-CM | POA: Diagnosis not present

## 2014-07-29 ENCOUNTER — Other Ambulatory Visit: Payer: Self-pay

## 2014-07-29 DIAGNOSIS — M5136 Other intervertebral disc degeneration, lumbar region: Secondary | ICD-10-CM | POA: Diagnosis not present

## 2014-08-11 DIAGNOSIS — M5136 Other intervertebral disc degeneration, lumbar region: Secondary | ICD-10-CM | POA: Diagnosis not present

## 2014-08-17 DIAGNOSIS — M4697 Unspecified inflammatory spondylopathy, lumbosacral region: Secondary | ICD-10-CM | POA: Diagnosis not present

## 2014-08-17 DIAGNOSIS — M5136 Other intervertebral disc degeneration, lumbar region: Secondary | ICD-10-CM | POA: Diagnosis not present

## 2014-08-23 ENCOUNTER — Telehealth: Payer: Self-pay | Admitting: Cardiovascular Disease

## 2014-08-23 NOTE — Telephone Encounter (Signed)
Pt has previously been cleared by Dr Burt Knack to hold Eliquis.    [name], MD at 07/06/2014 3:31 PM     Status: Signed       Expand All Collapse All   Pt is at low risk of holding Eliquis for 3 days as outlined for lumbar epidural injection. thx       I will forward this message to Dr Burt Knack for approval of next injection.

## 2014-08-23 NOTE — Telephone Encounter (Signed)
Ok to hold eliquis x 3 days for epidural injection. Resume when safe after procedure.

## 2014-08-23 NOTE — Telephone Encounter (Signed)
Request for surgical clearance:  1. What type of surgery is being performed? Pain Management Injection   2. When is this surgery scheduled? September 14, 2014   3. Are there any medications that need to be held prior to surgery and how long?Eliquis..3days prior to surgery   4. Name of physician performing surgery? Dr Suella Broad   5. What is your office phone and fax number? [phone] fax.  6.

## 2014-08-23 NOTE — Telephone Encounter (Signed)
Clearance faxed to [phone].

## 2014-08-30 ENCOUNTER — Other Ambulatory Visit: Payer: Self-pay | Admitting: Internal Medicine

## 2014-08-31 NOTE — Telephone Encounter (Signed)
Ok #60 

## 2014-08-31 NOTE — Telephone Encounter (Signed)
Pt is requesting refill on Tramadol.  Last OV: 06/17/2014 Last Fill: 06/16/2014 #[id number] UDS: None  Please advise.

## 2014-08-31 NOTE — Telephone Encounter (Signed)
Rx printed, awaiting MD signature.  

## 2014-08-31 NOTE — Telephone Encounter (Signed)
Rx faxed to [facility] pharmacy.  

## 2014-09-13 ENCOUNTER — Other Ambulatory Visit: Payer: Self-pay | Admitting: Cardiovascular Disease

## 2014-09-14 DIAGNOSIS — M47817 Spondylosis without myelopathy or radiculopathy, lumbosacral region: Secondary | ICD-10-CM | POA: Diagnosis not present

## 2014-09-28 ENCOUNTER — Other Ambulatory Visit: Payer: Self-pay

## 2014-09-28 DIAGNOSIS — M4697 Unspecified inflammatory spondylopathy, lumbosacral region: Secondary | ICD-10-CM | POA: Diagnosis not present

## 2014-09-28 DIAGNOSIS — M5136 Other intervertebral disc degeneration, lumbar region: Secondary | ICD-10-CM | POA: Diagnosis not present

## 2014-10-05 DIAGNOSIS — M545 Low back pain: Secondary | ICD-10-CM | POA: Diagnosis not present

## 2014-10-11 DIAGNOSIS — M545 Low back pain: Secondary | ICD-10-CM | POA: Diagnosis not present

## 2014-10-13 DIAGNOSIS — M545 Low back pain: Secondary | ICD-10-CM | POA: Diagnosis not present

## 2014-10-18 DIAGNOSIS — M545 Low back pain: Secondary | ICD-10-CM | POA: Diagnosis not present

## 2014-10-19 ENCOUNTER — Ambulatory Visit (INDEPENDENT_AMBULATORY_CARE_PROVIDER_SITE_OTHER): Payer: Medicare Other | Admitting: Internal Medicine

## 2014-10-19 ENCOUNTER — Encounter: Payer: Self-pay | Admitting: Internal Medicine

## 2014-10-19 DIAGNOSIS — I25119 Atherosclerotic heart disease of native coronary artery with unspecified angina pectoris: Secondary | ICD-10-CM

## 2014-10-19 DIAGNOSIS — E1159 Type 2 diabetes mellitus with other circulatory complications: Secondary | ICD-10-CM | POA: Diagnosis not present

## 2014-10-19 DIAGNOSIS — M549 Dorsalgia, unspecified: Secondary | ICD-10-CM | POA: Insufficient documentation

## 2014-10-19 DIAGNOSIS — I6523 Occlusion and stenosis of bilateral carotid arteries: Secondary | ICD-10-CM | POA: Diagnosis not present

## 2014-10-19 DIAGNOSIS — M545 Low back pain, unspecified: Secondary | ICD-10-CM

## 2014-10-19 DIAGNOSIS — Z79891 Long term (current) use of opiate analgesic: Secondary | ICD-10-CM | POA: Diagnosis not present

## 2014-10-19 LAB — CBC WITH DIFFERENTIAL/PLATELET
BASOS PCT: 0.6 % (ref 0.0–3.0)
Basophils Absolute: 0 10*3/uL (ref 0.0–0.1)
EOS ABS: 0.3 10*3/uL (ref 0.0–0.7)
Eosinophils Relative: 2.9 % (ref 0.0–5.0)
HCT: 43.8 % (ref 39.0–52.0)
HEMOGLOBIN: 14.7 g/dL (ref 13.0–17.0)
LYMPHS PCT: 20.4 % (ref 12.0–46.0)
Lymphs Abs: 1.8 10*3/uL (ref 0.7–4.0)
MCHC: 33.6 g/dL (ref 30.0–36.0)
MCV: 85.9 fl (ref 78.0–100.0)
MONO ABS: 0.7 10*3/uL (ref 0.1–1.0)
Monocytes Relative: 7.6 % (ref 3.0–12.0)
NEUTROS ABS: 6.2 10*3/uL (ref 1.4–7.7)
NEUTROS PCT: 68.5 % (ref 43.0–77.0)
Platelets: 256 10*3/uL (ref 150.0–400.0)
RBC: 5.1 Mil/uL (ref 4.22–5.81)
RDW: 14.8 % (ref 11.5–15.5)
WBC: 9 10*3/uL (ref 4.0–10.5)

## 2014-10-19 LAB — BASIC METABOLIC PANEL
BUN: 9 mg/dL (ref 6–23)
CHLORIDE: 104 meq/L (ref 96–112)
CO2: 28 mEq/L (ref 19–32)
CREATININE: 0.91 mg/dL (ref 0.40–1.50)
Calcium: 9.5 mg/dL (ref 8.4–10.5)
GFR: 85.36 mL/min (ref >60.00)
GLUCOSE: 136 mg/dL — AB (ref 70–99)
POTASSIUM: 4.1 meq/L (ref 3.5–5.1)
SODIUM: 139 meq/L (ref 135–145)

## 2014-10-19 LAB — HM DIABETES FOOT EXAM

## 2014-10-19 LAB — HEMOGLOBIN A1C: Hgb A1c MFr Bld: 6.5 % (ref 4.6–6.5)

## 2014-10-19 MED ORDER — PRAVASTATIN SODIUM 40 MG PO TABS: 40.0000 mg | ORAL_TABLET | Freq: Every day | ORAL | Status: DC

## 2014-10-19 NOTE — Progress Notes (Signed)
Subjective:    Patient ID: Fernando Moyer, male    DOB: [birth year], 81 y.o.   MRN: [id number]  DOS:  10/19/2014 Type of visit - description : Routine visit Interval history: In general feeling well, no major concerns, good compliance with medication. He continue to have back pain.   Review of Systems  Denies chest pain or difficulty breathing Admits to feet tingling and burning sometimes, no numbness.  Past Medical History  Diagnosis Date  . GERD with stricture     w/hx of stricture  . PVD (peripheral vascular disease) 12/07    per cath 12/07....iliac  . Carotid artery occlusion     last u/s 3-11...Marland Kitchenper vasc.surgery 2007 cath: nonobstructive CAD. mil;d MS, trivial AoS, small AAA,  . Bowen's disease     right arm BX: referred to dermatology  . Hyperlipidemia     takes Pravastatin daily  . AAA (abdominal aortic aneurysm) 05/2008    PER CARDIAC CATH  . Aortic stenosis 05/2008    PER CARDIAC CATH, MILD, MITRAL STENOSIS  . Mitral stenosis 05/2008    PER CARDIAC CATH  . Coronary artery disease 05/2008    MILD, MEDICAL MANAGEMENT  . Pulmonary hypertension   . Dyspnea      w/u included a cath and PFT's, sxms thought to be pulmonary  . Chronic back pain   . Peripheral neuropathy     right thigh; "it's been that way for years"  . Diabetes mellitus 09/07/2011  . Benign neoplasm of colon   . Stricture and stenosis of esophagus   . Myocardial infarction 2012  . COPD (chronic obstructive pulmonary disease)   . Dizziness     takes Meclizine daily  . Back pain     cause unknown but does get Cortisone injection every now and then    Past Surgical History  Procedure Laterality Date  . Appendectomy    . Esophageal dilation    . Cardiac catheterization  02/2011    LAD 50, CFX OK, RCA40, EF 55%; PCWP 17, AoV  0.9 cm squared  . Endarterectomy  08-2010    Right carotid endarterectomy    . Aortic valve replacement  08/17/2011    Procedure: AORTIC VALVE REPLACEMENT (AVR);  Surgeon: Gaye Pollack, MD;  Location: [place];  Service: Open Heart Surgery;  Laterality: N/A;  . Colonoscopy    . Sternal wires removal N/A 05/01/2013    Procedure: STERNAL WIRES REMOVAL;  Surgeon: Gaye Pollack, MD;  Location: [place] OR;  Service: Thoracic;  Laterality: N/A;  . Left and right heart catheterization with coronary angiogram N/A 02/23/2011    Procedure: LEFT AND RIGHT HEART CATHETERIZATION WITH CORONARY ANGIOGRAM;  Surgeon: [name], MD;  Location: [facility] CATH LAB;  Service: Cardiovascular;  Laterality: N/A;  . Eye surgery  10/14/13    cataract surgery L eye     History   Social History  . Marital Status: Widowed    Spouse Name: N/A  . Number of Children: 2  . Years of Education: N/A   Occupational History  . 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 Smoking status: Former Smoker -- 2.00 packs/day for 30 years    Types: Cigarettes  . Smokeless tobacco: Never Used     Comment: quit smoking in 1989  . Alcohol Use: No  . Drug Use: No  . Sexual  Activity: No   Other Topics Concern  . Not on file   Social History Narrative   LIVES BY HIMSELF    HAS 2 GROWN SONS, 5 G-KIDS (Melissa usually comes with him for his appointments)               Medication List       This list is accurate as of: 10/19/14 11:59 PM.  Always use your most recent med list.               acetaminophen 500 MG tablet  Commonly known as:  TYLENOL  Take 1,000 mg by mouth daily as needed (pain).     apixaban 5 MG Tabs tablet  Commonly known as:  ELIQUIS  Take 1 tablet (5 mg total) by mouth 2 (two) times daily.     furosemide 40 MG tablet  Commonly known as:  LASIX  Take 1 tablet (40 mg total) by mouth daily.     meclizine 25 MG tablet  Commonly known as:  ANTIVERT  TAKE 1 TABLET 3 TIMES A DAY AS NEEDED FOR DIZZINESS     omeprazole 40 MG capsule  Commonly known as:  PRILOSEC  Take 40 mg by mouth 2 (two) times  daily.     potassium chloride SA 20 MEQ tablet  Commonly known as:  KLOR-CON M20  Take 1 tablet (20 mEq total) by mouth daily.     pravastatin 40 MG tablet  Commonly known as:  PRAVACHOL  Take 1 tablet (40 mg total) by mouth daily.     tiotropium 18 MCG inhalation capsule  Commonly known as:  SPIRIVA  Place 18 mcg into inhaler and inhale daily. Place one capsule into inhaler and inhale into lungs once daily     traMADol 50 MG tablet  Commonly known as:  ULTRAM  Take 1 tablet (50 mg total) by mouth every 12 (twelve) hours as needed. for pain           Objective:   Physical Exam BP 126/58 mmHg  Pulse 76  Temp(Src) 97.7 F (36.5 C) (Oral)  Ht 5\' 7"  (1.702 m)  Wt 216 lb (97.977 kg)  BMI 33.82 kg/m2  SpO2 95%    General:   Well developed, well nourished . NAD.  HEENT:  Normocephalic . Face symmetric, atraumatic Lungs:  CTA B Normal respiratory effort, no intercostal retractions, no accessory muscle use. Heart: RRR,  no murmur.  No pretibial edema bilaterally  Abdomen: Soft, nontender, nondistended Skin: Not pale. Not jaundice Diabetic foot exam: No edema, skin without lesions, pinprick examination normal Neurologic:  alert & oriented X3.  Speech normal, gait appropriate for age and unassisted Psych--  Cognition and judgment appear intact.  Cooperative with normal attention span and concentration.  Behavior appropriate. No anxious or depressed appearing.   Assessment & Plan:

## 2014-10-19 NOTE — Progress Notes (Signed)
Pre visit review using our clinic review tool, if applicable. No additional management support is needed unless otherwise documented below in the visit note. 

## 2014-10-19 NOTE — Patient Instructions (Signed)
Get your blood work before you leave    

## 2014-10-19 NOTE — Assessment & Plan Note (Signed)
Asymptomatic. Check a CBC and BMP

## 2014-10-19 NOTE — Assessment & Plan Note (Signed)
We'll check A1c Feet exam today negative but he reports feet burning, he has neuropathy Reports an eye exam April 2016

## 2014-10-19 NOTE — Assessment & Plan Note (Addendum)
Follow-up by Dr. Nelva Bush, recently had a injection did not help. Currently doing water physical therapy. RF Ultram as needed , check a UDS

## 2014-10-20 DIAGNOSIS — M545 Low back pain: Secondary | ICD-10-CM | POA: Diagnosis not present

## 2014-10-25 DIAGNOSIS — M545 Low back pain: Secondary | ICD-10-CM | POA: Diagnosis not present

## 2014-10-27 DIAGNOSIS — M545 Low back pain: Secondary | ICD-10-CM | POA: Diagnosis not present

## 2014-11-01 ENCOUNTER — Telehealth: Payer: Self-pay

## 2014-11-01 NOTE — Telephone Encounter (Signed)
UDS:10/19/2014  Negative for Ultram: PRN    Low risk per Dr. Larose Kells 11/01/2014

## 2014-11-09 DIAGNOSIS — M4697 Unspecified inflammatory spondylopathy, lumbosacral region: Secondary | ICD-10-CM | POA: Diagnosis not present

## 2014-11-09 DIAGNOSIS — M5136 Other intervertebral disc degeneration, lumbar region: Secondary | ICD-10-CM | POA: Diagnosis not present

## 2014-11-26 ENCOUNTER — Other Ambulatory Visit: Payer: Self-pay | Admitting: Internal Medicine

## 2014-12-13 ENCOUNTER — Ambulatory Visit (INDEPENDENT_AMBULATORY_CARE_PROVIDER_SITE_OTHER): Payer: Medicare Other | Admitting: Cardiovascular Disease

## 2014-12-13 ENCOUNTER — Encounter: Payer: Self-pay | Admitting: Cardiovascular Disease

## 2014-12-13 VITALS — BP 120/70 | HR 89 | Ht 67.0 in | Wt 218.2 lb

## 2014-12-13 DIAGNOSIS — Z954 Presence of other heart-valve replacement: Secondary | ICD-10-CM | POA: Diagnosis not present

## 2014-12-13 DIAGNOSIS — Z952 Presence of prosthetic heart valve: Secondary | ICD-10-CM

## 2014-12-13 DIAGNOSIS — I6523 Occlusion and stenosis of bilateral carotid arteries: Secondary | ICD-10-CM

## 2014-12-13 DIAGNOSIS — E785 Hyperlipidemia, unspecified: Secondary | ICD-10-CM

## 2014-12-13 NOTE — Patient Instructions (Signed)
Medication Instructions:  Your physician recommends that you continue on your current medications as directed. Please refer to the Current Medication list given to you today.  Labwork: No new orders.   Testing/Procedures: No new orders.   Follow-Up: Your physician wants you to follow-up in: 6 MONTHS with Dr Cooper.  You will receive a reminder letter in the mail two months in advance. If you don't receive a letter, please call our office to schedule the follow-up appointment.   Any Other Special Instructions Will Be Listed Below (If Applicable).   

## 2014-12-13 NOTE — Progress Notes (Signed)
Cardiology Office Note Date:  12/15/2014   ID:  Fernando Moyer, DOB Jan 12, 1934, MRN [id number]  PCP:  [name], MD  Cardiologist:  [name], MD    Chief Complaint  Patient presents with  . Shortness of Breath    History of Present Illness: Fernando Moyer is a 81 y.o. male who presents for follow-up of aortic valve disease. The patient has undergone bioprosthetic aortic valve replacement in 2013. He's also followed for hypertension and paroxysmal atrial fibrillation and is maintained on chronic anticoagulation with eliquis.  Last echo in August 2015 showed normal LV systolic function and normal function of his aortic valve bioprosthesis.   The patient continues to have lots of problems with back and leg pain. Also limited by shortness of breath. He has chest pain but feels more like incision pain, tender to touch. He has undergone removal of sternal wires for the same symptoms. No exertional chest pain or tightness. Denies bleeding problems.   Past Medical History  Diagnosis Date  . GERD with stricture     w/hx of stricture  . PVD (peripheral vascular disease) 12/07    per cath 12/07....iliac  . Carotid artery occlusion     last u/s 3-11...Marland Kitchenper vasc.surgery 2007 cath: nonobstructive CAD. mil;d MS, trivial AoS, small AAA,  . Bowen's disease     right arm BX: referred to dermatology  . Hyperlipidemia     takes Pravastatin daily  . AAA (abdominal aortic aneurysm) 05/2008    PER CARDIAC CATH  . Aortic stenosis 05/2008    PER CARDIAC CATH, MILD, MITRAL STENOSIS  . Mitral stenosis 05/2008    PER CARDIAC CATH  . Coronary artery disease 05/2008    MILD, MEDICAL MANAGEMENT  . Pulmonary hypertension   . Dyspnea      w/u included a cath and PFT's, sxms thought to be pulmonary  . Chronic back pain   . Peripheral neuropathy     right thigh; "it's been that way for years"  . Diabetes mellitus 09/07/2011  . Benign neoplasm of colon   . Stricture and stenosis of esophagus   .  Myocardial infarction 2012  . COPD (chronic obstructive pulmonary disease)   . Dizziness     takes Meclizine daily  . Back pain     cause unknown but does get Cortisone injection every now and then    Past Surgical History  Procedure Laterality Date  . Appendectomy    . Esophageal dilation    . Cardiac catheterization  02/2011    LAD 50, CFX OK, RCA40, EF 55%; PCWP 17, AoV  0.9 cm squared  . Endarterectomy  08-2010    Right carotid endarterectomy    . Aortic valve replacement  08/17/2011    Procedure: AORTIC VALVE REPLACEMENT (AVR);  Surgeon: Gaye Pollack, MD;  Location: [facility];  Service: Open Heart Surgery;  Laterality: N/A;  . Colonoscopy    . Sternal wires removal N/A 05/01/2013    Procedure: STERNAL WIRES REMOVAL;  Surgeon: Gaye Pollack, MD;  Location: [facility];  Service: Thoracic;  Laterality: N/A;  . Left and right heart catheterization with coronary angiogram N/A 02/23/2011    Procedure: LEFT AND RIGHT HEART CATHETERIZATION WITH CORONARY ANGIOGRAM;  Surgeon: [name], MD;  Location: [facility] CATH LAB;  Service: Cardiovascular;  Laterality: N/A;  . Eye surgery  10/14/13    cataract surgery L eye     Current Outpatient Prescriptions  Medication Sig Dispense Refill  . acetaminophen (TYLENOL)  500 MG tablet Take 1,000 mg by mouth daily as needed (pain).    Marland Kitchen apixaban (ELIQUIS) 5 MG TABS tablet Take 1 tablet (5 mg total) by mouth 2 (two) times daily. 180 tablet 2  . furosemide (LASIX) 40 MG tablet Take 1 tablet (40 mg total) by mouth daily. 90 tablet 2  . meclizine (ANTIVERT) 25 MG tablet TAKE 1 TABLET 3 TIMES A DAY AS NEEDED FOR DIZZINESS 30 tablet 1  . omeprazole (PRILOSEC) 40 MG capsule Take 40 mg by mouth 2 (two) times daily.    Marland Kitchen oxyCODONE-acetaminophen (PERCOCET) 10-325 MG per tablet Take 1 tablet by mouth daily as needed. For pain  0  . potassium chloride SA (KLOR-CON M20) 20 MEQ tablet Take 1 tablet (20 mEq total) by mouth daily. 90 tablet 2  . pravastatin (PRAVACHOL) 40 MG  tablet Take 1 tablet (40 mg total) by mouth daily. 90 tablet 2  . tiotropium (SPIRIVA HANDIHALER) 18 MCG inhalation capsule Place 1 capsule (18 mcg total) into inhaler and inhale daily. 90 capsule 1  . traMADol (ULTRAM) 50 MG tablet Take 1 tablet (50 mg total) by mouth every 12 (twelve) hours as needed. for pain 60 tablet 2   No current facility-administered medications for this visit.    Allergies:   Review of patient's allergies indicates no known allergies.   Social History:  The patient  reports that he has quit smoking. His smoking use included Cigarettes. He has a 60 pack-year smoking history. He has never used smokeless tobacco. He reports that he does not drink alcohol or use illicit drugs.   Family History:  The patient's  family history includes Heart attack (age of onset: 42) in his father; Prostate cancer (age of onset: 81) in his brother. There is no history of Diabetes, Colon cancer, Anesthesia problems, Hypotension, Malignant hyperthermia, Pseudochol deficiency, or Stomach cancer.    ROS:  Please see the history of present illness.  Otherwise, review of systems is positive for back pain, leg pain, fatigue, shortness of breath.  All other systems are reviewed and negative.    PHYSICAL EXAM: VS:  BP 120/70 mmHg  Pulse 89  Ht 5\' 7"  (1.702 m)  Wt 218 lb 3.2 oz (98.975 kg)  BMI 34.17 kg/m2 , BMI Body mass index is 34.17 kg/(m^2). GEN: Well nourished, well developed, in no acute distress HEENT: normal Neck: no JVD, no masses. bilateral carotid bruits Cardiac: irregular with 2/6 SEM at the RUSB         Respiratory:  clear to auscultation bilaterally, normal work of breathing GI: soft, nontender, nondistended, + BS MS: no deformity or atrophy Ext: no pretibial edema Skin: warm and dry, no rash Neuro:  Strength and sensation are intact Psych: euthymic mood, full affect  EKG:  EKG is ordered today. The ekg ordered today shows NSR with RBBB, LAFB, frequent PAC's  Recent  Labs: 06/17/2014: ALT 8; TSH 4.54* 10/19/2014: BUN 9; Creatinine, Ser 0.91; Hemoglobin 14.7; Platelets 256.0; Potassium 4.1; Sodium 139   Lipid Panel     Component Value Date/Time   CHOL 144 06/17/2014 0933   TRIG 165.0* 06/17/2014 0933   TRIG 96 03/20/2006 0945   HDL 33.50* 06/17/2014 0933   CHOLHDL 4 06/17/2014 0933   CHOLHDL 3.9 CALC 03/20/2006 0945   VLDL 33.0 06/17/2014 0933   LDLCALC 78 06/17/2014 0933   LDLDIRECT 120.3 02/01/2006 1001      Wt Readings from Last 3 Encounters:  12/13/14 218 lb 3.2 oz (98.975 kg)  10/19/14 216 lb (97.977 kg)  06/17/14 212 lb 2 oz (96.219 kg)     Cardiac Studies Reviewed: 2D Echo Sept 2015: Study Conclusions  - Left ventricle: The cavity size was normal. There was moderate concentric hypertrophy. Systolic function was vigorous. The estimated ejection fraction was in the range of 65% to 70%. Wall motion was normal; there were no regional wall motion abnormalities. Doppler parameters are consistent with high ventricular filling pressure. - Aortic valve: A bioprosthesis was present. There was trivial regurgitation. Peak velocity (S): 291 cm/s. Within normal range (AVR). - Mitral valve: Moderately calcified annulus. - Left atrium: The atrium was mildly dilated.  ASSESSMENT AND PLAN: 1.  CAD, native vessel: no angina. Continue current Rx. Has had nonobstructive disease at cath in past.   2. Aortic valve disease s/p bioprosthetic AVR: normal function of his aortic valve bioprosthesis by most recent echo.  3. Paroxysmal atrial fibrillation: maintaining sinus but frequent PAC's noted. Tolerating anticoagulation with Eliquis.  4. PAD with carotid stenosis, AAA, iliac occlusion: followed by Dr Scot Dock, appears to be stable.    Current medicines are reviewed with the patient today.  The patient does not have concerns regarding medicines.  Labs/ tests ordered today include:   Orders Placed This Encounter  Procedures  . EKG  12-Lead    Disposition:   FU 6 months  Signed, [name], MD  12/15/2014 9:50 PM    [facility] [place], [place], [place]  [address] Phone: [phone]; Fax: [phone]

## 2014-12-14 ENCOUNTER — Other Ambulatory Visit: Payer: Self-pay | Admitting: Internal Medicine

## 2014-12-15 ENCOUNTER — Encounter: Payer: Self-pay | Admitting: Cardiovascular Disease

## 2014-12-15 MED ORDER — TRAMADOL HCL 50 MG PO TABS: 50.0000 mg | ORAL_TABLET | Freq: Two times a day (BID) | ORAL | Status: DC | PRN

## 2014-12-15 NOTE — Telephone Encounter (Signed)
Rx faxed to [facility] pharmacy.  

## 2014-12-15 NOTE — Telephone Encounter (Signed)
Okay #60, 2 prescriptions 

## 2014-12-15 NOTE — Telephone Encounter (Signed)
Rx printed, awaiting MD signature.  

## 2014-12-15 NOTE — Telephone Encounter (Signed)
Pt is requesting a refill on Tramadol.  Last OV: 10/19/2014 Last Fill: 08/31/2014 #60 0RF UDS: 10/19/2014 Low risk  Please advise.

## 2015-01-18 DIAGNOSIS — H524 Presbyopia: Secondary | ICD-10-CM | POA: Diagnosis not present

## 2015-01-18 DIAGNOSIS — H25811 Combined forms of age-related cataract, right eye: Secondary | ICD-10-CM | POA: Diagnosis not present

## 2015-01-18 DIAGNOSIS — H26492 Other secondary cataract, left eye: Secondary | ICD-10-CM | POA: Diagnosis not present

## 2015-01-20 DIAGNOSIS — Z08 Encounter for follow-up examination after completed treatment for malignant neoplasm: Secondary | ICD-10-CM | POA: Diagnosis not present

## 2015-01-20 DIAGNOSIS — Z85820 Personal history of malignant melanoma of skin: Secondary | ICD-10-CM | POA: Diagnosis not present

## 2015-01-20 DIAGNOSIS — L570 Actinic keratosis: Secondary | ICD-10-CM | POA: Diagnosis not present

## 2015-02-07 ENCOUNTER — Encounter: Payer: Self-pay | Admitting: Internal Medicine

## 2015-02-07 ENCOUNTER — Other Ambulatory Visit: Payer: Self-pay | Admitting: Internal Medicine

## 2015-02-07 DIAGNOSIS — K219 Gastro-esophageal reflux disease without esophagitis: Secondary | ICD-10-CM

## 2015-02-08 ENCOUNTER — Telehealth: Payer: Self-pay | Admitting: Gastroenterology

## 2015-02-08 NOTE — Telephone Encounter (Signed)
Spoke with Melissa and scheduled patient on 03/02/15 with Cecille Rubin Hvozdovic, PA-C.

## 2015-03-02 ENCOUNTER — Ambulatory Visit (INDEPENDENT_AMBULATORY_CARE_PROVIDER_SITE_OTHER): Payer: Medicare Other | Admitting: Physician Assistant

## 2015-03-02 ENCOUNTER — Telehealth: Payer: Self-pay

## 2015-03-02 ENCOUNTER — Encounter: Payer: Self-pay | Admitting: Physician Assistant

## 2015-03-02 VITALS — BP 124/70 | HR 60 | Ht 67.0 in | Wt 218.0 lb

## 2015-03-02 DIAGNOSIS — R1310 Dysphagia, unspecified: Secondary | ICD-10-CM

## 2015-03-02 DIAGNOSIS — Z7901 Long term (current) use of anticoagulants: Secondary | ICD-10-CM

## 2015-03-02 DIAGNOSIS — I480 Paroxysmal atrial fibrillation: Secondary | ICD-10-CM

## 2015-03-02 DIAGNOSIS — I6523 Occlusion and stenosis of bilateral carotid arteries: Secondary | ICD-10-CM

## 2015-03-02 NOTE — Telephone Encounter (Signed)
May hold x 48 hours, then resume post-endoscopy when ok with gastroenterologist depending on whether biopsies performed.

## 2015-03-02 NOTE — Telephone Encounter (Signed)
03/02/2015   RE: Fernando Moyer DOB: 08-06-1933 MRN: [id number]   Dear Dr Burt Knack ,    We have scheduled the above patient for an endoscopic procedure. Our records show that he is on anticoagulation therapy.    Please advise as to how long the patient may come off his therapy of Eliquis prior to his EGD, which is scheduled for 03-22-2015. Please send completed form back to [facility], [address] at [phone].  Sincerely,    Elias Else, CMA

## 2015-03-02 NOTE — Patient Instructions (Signed)
You have been scheduled for an endoscopy. Please follow written instructions given to you at your visit today. If you use inhalers (even only as needed), please bring them with you on the day of your procedure. Your physician has requested that you go to www.startemmi.com and enter the access code given to you at your visit today. This web site gives a general overview about your procedure. However, you should still follow specific instructions given to you by our office regarding your preparation for the procedure.  Please continue to take Omeprazole twice a day.  Cut food into small pieces and and chew thoroughly.  We will contact you with directions on how to hold your Eliquis.

## 2015-03-02 NOTE — Progress Notes (Signed)
Patient ID: Fernando Moyer, male   DOB: 12/24/1933, 81 y.o.   MRN: [id number]     History of Present Illness: Fernando Moyer is a delightful 81 year old male who was previously followed by Dr. Sharlett Iles. He has a history of esophageal strictures. He last had an EGD on 02/20/2013. A large hiatal hernia was noted. The mucosa of the esophagus appeared normal. Retroflexed views revealed a large hiatal hernia. Dilated #52 [name], slight resistance noted. Patient states he did well for a period of time with no dysphagia but for the past 4 months has again been having progressive dysphagia. He is unable to eat any meats. If he has any dry foods like breads or [place] he has to cut up very very small. He has to eat very slowly and take frequent sips of water to push this food down. He has no epigastric pain, nausea, vomiting, early satiety, or weight loss.  [name] has a history of a bioprosthetic aortic valve replacement, hypertension, paroxysmal atrial fibrillation, and he is maintained on chronic anticoagulation with eliquis.  Echo in August 2015 showed normal LV function.   Past Medical History  Diagnosis Date  . GERD with stricture     w/hx of stricture  . PVD (peripheral vascular disease) ([place]) 12/07    per cath 12/07....iliac  . Carotid artery occlusion     last u/s 3-11...Marland Kitchenper vasc.surgery 2007 cath: nonobstructive CAD. mil;d MS, trivial AoS, small AAA,  . Bowen's disease     right arm BX: referred to dermatology  . Hyperlipidemia     takes Pravastatin daily  . AAA (abdominal aortic aneurysm) ([place]) 05/2008    PER CARDIAC CATH  . Aortic stenosis 05/2008    PER CARDIAC CATH, MILD, MITRAL STENOSIS  . Mitral stenosis 05/2008    PER CARDIAC CATH  . Coronary artery disease 05/2008    MILD, MEDICAL MANAGEMENT  . Pulmonary hypertension ([place])   . Dyspnea      w/u included a cath and PFT's, sxms thought to be pulmonary  . Chronic back pain   . Peripheral neuropathy ([facility])     right thigh;  "it's been that way for years"  . Diabetes mellitus ([place]) 09/07/2011  . Benign neoplasm of colon   . Stricture and stenosis of esophagus   . Myocardial infarction ([place]) 2012  . COPD (chronic obstructive pulmonary disease) ([place])   . Dizziness     takes Meclizine daily  . Back pain     cause unknown but does get Cortisone injection every now and then    Past Surgical History  Procedure Laterality Date  . Appendectomy    . Esophageal dilation    . Cardiac catheterization  02/2011    LAD 50, CFX OK, RCA40, EF 55%; PCWP 17, AoV  0.9 cm squared  . Endarterectomy  08-2010    Right carotid endarterectomy    . Aortic valve replacement  08/17/2011    Procedure: AORTIC VALVE REPLACEMENT (AVR);  Surgeon: Gaye Pollack, MD;  Location: [facility];  Service: Open Heart Surgery;  Laterality: N/A;  . Colonoscopy    . Sternal wires removal N/A 05/01/2013    Procedure: STERNAL WIRES REMOVAL;  Surgeon: Gaye Pollack, MD;  Location: [facility] OR;  Service: Thoracic;  Laterality: N/A;  . Left and right heart catheterization with coronary angiogram N/A 02/23/2011    Procedure: LEFT AND RIGHT HEART CATHETERIZATION WITH CORONARY ANGIOGRAM;  Surgeon: [name], MD;  Location: [facility] CATH LAB;  Service: Cardiovascular;  Laterality: N/A;  . Eye surgery  10/14/13    cataract surgery L eye    Family History  Problem Relation Age of Onset  . Heart attack Father 32    MI  . Diabetes Neg Hx   . Colon cancer Neg Hx   . Prostate cancer Brother 34  . Anesthesia problems Neg Hx   . Hypotension Neg Hx   . Malignant hyperthermia Neg Hx   . Pseudochol deficiency Neg Hx   . Stomach cancer Neg Hx    Social History  Substance Use Topics  . Smoking status: Former Smoker -- 2.00 packs/day for 30 years    Types: Cigarettes  . Smokeless tobacco: Never Used     Comment: quit smoking in 1989  . Alcohol Use: No   Current Outpatient Prescriptions  Medication Sig Dispense Refill  . acetaminophen (TYLENOL) 500 MG tablet Take 1,000  mg by mouth daily as needed (pain).    Marland Kitchen apixaban (ELIQUIS) 5 MG TABS tablet Take 1 tablet (5 mg total) by mouth 2 (two) times daily. 180 tablet 2  . furosemide (LASIX) 40 MG tablet Take 1 tablet (40 mg total) by mouth daily. 90 tablet 2  . meclizine (ANTIVERT) 25 MG tablet TAKE 1 TABLET 3 TIMES A DAY AS NEEDED FOR DIZZINESS 30 tablet 1  . omeprazole (PRILOSEC) 40 MG capsule Take 40 mg by mouth 2 (two) times daily.    Marland Kitchen oxyCODONE-acetaminophen (PERCOCET) 10-325 MG per tablet Take 1 tablet by mouth daily as needed. For pain  0  . potassium chloride SA (KLOR-CON M20) 20 MEQ tablet Take 1 tablet (20 mEq total) by mouth daily. 90 tablet 2  . pravastatin (PRAVACHOL) 40 MG tablet Take 1 tablet (40 mg total) by mouth daily. 90 tablet 2  . tiotropium (SPIRIVA HANDIHALER) 18 MCG inhalation capsule Place 1 capsule (18 mcg total) into inhaler and inhale daily. 90 capsule 1  . traMADol (ULTRAM) 50 MG tablet Take 1 tablet (50 mg total) by mouth every 12 (twelve) hours as needed. for pain 60 tablet 2   No current facility-administered medications for this visit.   No Known Allergies   Review of Systems: Gen: Denies any fever, chills, sweats, anorexia, fatigue, weakness, malaise, weight loss, and sleep disorder CV: Denies chest pain, angina, palpitations, syncope, orthopnea, PND, peripheral edema, and claudication. Resp: Denies dyspnea at rest, dyspnea with exercise, cough, sputum, wheezing, coughing up blood, and pleurisy. GI: Denies vomiting blood, jaundice, and fecal incontinence.  Has dysphagia to solids. GU : Denies urinary burning, blood in urine, urinary frequency, urinary hesitancy, nocturnal urination, and urinary incontinence. MS: Denies joint pain, limitation of movement, and swelling, stiffness, low back pain, extremity pain. Denies muscle weakness, cramps, atrophy.  Derm: Denies rash, itching, dry skin, hives, moles, warts, or unhealing ulcers.  Psych: Denies depression, anxiety, memory loss,  suicidal ideation, hallucinations, paranoia, and confusion. Heme: Denies bruising, bleeding, and enlarged lymph nodes. Neuro:  Denies any headaches, dizziness, paresthesia Endo:  Denies any problems with DM, thyroid, adrenal   Physical Exam: BP 124/70 mmHg  Pulse 60  Ht 5\' 7"  (1.702 m)  Wt 218 lb (98.884 kg)  BMI 34.14 kg/m2 General: Pleasant, well developed , elderly, Caucasian male in no acute distress Head: Normocephalic and atraumatic Eyes:  sclerae anicteric, conjunctiva pink  Ears: Normal auditory acuity Lungs: Clear throughout to auscultation Heart: Irregular, 2/6 systolic ejection murmur Abdomen: Soft, non distended, non-tender. No masses, no hepatomegaly. Normal bowel sounds Musculoskeletal: Symmetrical with no gross deformities  Extremities: No edema  Neurological: Alert oriented x 4, grossly nonfocal Psychological:  Alert and cooperative. Normal mood and affect  Assessment and Recommendations:  81 year old male with a history of esophageal strictures presenting with recurrent dysphagia of 4 months duration. He has been advised to undergo repeat EGD with possible dilation.The risks, benefits, and alternatives to endoscopy with possible biopsy and possible dilation were discussed with the patient and they consent to proceed.  Will hold eliquis  2 days prior to endoscopic procedures - will instruct when and how to resume after procedure. Benefits and risks of procedure explained including risks of bleeding, perforation, infection, missed lesions, reactions to medications and possible need for hospitalization and surgery for complications. Additional rare but real risk of stroke or other vascular clotting events off eliquis  also explained and need to seek urgent help if any signs of these problems occur. Will communicate by phone or EMR with patient's  prescribing provider to confirm that holding eliquis is reasonable in this case. The procedure will be scheduled with Dr. Ardis Hughs per  patient request.       Dylynn Ketner, Vita [place]ley PA-C 03/02/2015,

## 2015-03-03 NOTE — Telephone Encounter (Signed)
Left voicemail to have pt return phone call. [id number] thinner instructions.

## 2015-03-03 NOTE — Progress Notes (Signed)
i agree with the above note, plan 

## 2015-03-04 NOTE — Telephone Encounter (Signed)
Pt has been notified and aware to hold Eliquis 2 days prior to his procedure. He states understanding.

## 2015-03-04 NOTE — Telephone Encounter (Signed)
Left voicemail to return call. 

## 2015-03-22 ENCOUNTER — Ambulatory Visit (AMBULATORY_SURGERY_CENTER): Payer: Medicare Other | Admitting: Gastroenterology

## 2015-03-22 ENCOUNTER — Encounter: Payer: Medicare Other | Admitting: Gastroenterology

## 2015-03-22 ENCOUNTER — Encounter: Payer: Self-pay | Admitting: Gastroenterology

## 2015-03-22 VITALS — BP 111/62 | HR 80 | Temp 97.0°F | Resp 18 | Ht 67.0 in | Wt 218.0 lb

## 2015-03-22 DIAGNOSIS — I10 Essential (primary) hypertension: Secondary | ICD-10-CM | POA: Diagnosis not present

## 2015-03-22 DIAGNOSIS — I6529 Occlusion and stenosis of unspecified carotid artery: Secondary | ICD-10-CM | POA: Diagnosis not present

## 2015-03-22 DIAGNOSIS — R1310 Dysphagia, unspecified: Secondary | ICD-10-CM

## 2015-03-22 DIAGNOSIS — I4891 Unspecified atrial fibrillation: Secondary | ICD-10-CM | POA: Diagnosis not present

## 2015-03-22 DIAGNOSIS — R42 Dizziness and giddiness: Secondary | ICD-10-CM | POA: Diagnosis not present

## 2015-03-22 DIAGNOSIS — I739 Peripheral vascular disease, unspecified: Secondary | ICD-10-CM | POA: Diagnosis not present

## 2015-03-22 DIAGNOSIS — I2510 Atherosclerotic heart disease of native coronary artery without angina pectoris: Secondary | ICD-10-CM | POA: Diagnosis not present

## 2015-03-22 DIAGNOSIS — J449 Chronic obstructive pulmonary disease, unspecified: Secondary | ICD-10-CM | POA: Diagnosis not present

## 2015-03-22 DIAGNOSIS — K222 Esophageal obstruction: Secondary | ICD-10-CM

## 2015-03-22 MED ORDER — SODIUM CHLORIDE 0.9 % IV SOLN: 500.0000 mL | INTRAVENOUS | Status: DC

## 2015-03-22 NOTE — Progress Notes (Signed)
Report to PACU, RN, vss, BBS= Clear.  

## 2015-03-22 NOTE — Progress Notes (Signed)
Called to room to assist during endoscopic procedure.  Patient ID and intended procedure confirmed with present staff. Received instructions for my participation in the procedure from the performing physician.  

## 2015-03-22 NOTE — Op Note (Addendum)
[place]  [address]. [place], [id number]   ENDOSCOPY PROCEDURE REPORT  PATIENT: Fernando, Moyer  MR#: [id number] BIRTHDATE: 09-20-1933 , 81  yrs. old GENDER: male ENDOSCOPIST: Milus Banister, MD PROCEDURE DATE:  03/22/2015 PROCEDURE:  EGD w/ balloon dilation ASA CLASS:     Class III INDICATIONS:  dysphagia (chronic, solids), esophageal stricture noted EGD 2014 Dr.  Sharlett Iles, dilated to 72 Fr. MEDICATIONS: Monitored anesthesia care and Propofol 100 mg IV TOPICAL ANESTHETIC: none  DESCRIPTION OF PROCEDURE: After the risks benefits and alternatives of the procedure were thoroughly explained, informed consent was obtained.  The LB [id number] endoscope was introduced through the mouth and advanced to the second portion of the duodenum , Without limitations.  The instrument was slowly withdrawn as the mucosa was fully examined.  There was a large hiatal hernia (5-6cm).  There was a thick Schatzki's ring vs.  focal peptic stricture with lumen 50mm, this was dilated with a TTS balloon held inflated to 50mm for 1 minute. There was typical superficial mucosal tear and self limited oozing of blood following dilation.  The examinationw was otherwise normal.  Retroflexed views revealed no abnormalities.     The scope was then withdrawn from the patient and the procedure completed. COMPLICATIONS: There were no immediate complications.  ENDOSCOPIC IMPRESSION: There was a large hiatal hernia (5-6cm).  There was a thick Schatzki's ring vs.  focal peptic stricture with lumen 61mm, this was dilated with a TTS balloon held inflated to 68mm for 1 minute. There was typical superficial mucosal tear and self limited oozing of blood following dilation.  The examinationw was otherwise normal   RECOMMENDATIONS: Continue PPI twice daily.  Continue to chew your food well, eat slowly and take small bites.  Please call if the swallowing difficulty returns. OK to resume  your eliquis tomorrow.   eSigned:  Milus Banister, MD 03/22/2015 8:55 AM Revised: 03/22/2015 8:55 AM   CC: [name], MD

## 2015-03-22 NOTE — Patient Instructions (Signed)
YOU HAD AN ENDOSCOPIC PROCEDURE TODAY AT [facility]:   Refer to the procedure report that was given to you for any specific questions about what was found during the examination.  If the procedure report does not answer your questions, please call your gastroenterologist to clarify.  If you requested that your care partner not be given the details of your procedure findings, then the procedure report has been included in a sealed envelope for you to review at your convenience later.  YOU SHOULD EXPECT: Some feelings of bloating in the abdomen. Passage of more gas than usual.  Walking can help get rid of the air that was put into your GI tract during the procedure and reduce the bloating. If you had a lower endoscopy (such as a colonoscopy or flexible sigmoidoscopy) you may notice spotting of blood in your stool or on the toilet paper. If you underwent a bowel prep for your procedure, you may not have a normal bowel movement for a few days.  Please Note:  You might notice some irritation and congestion in your nose or some drainage.  This is from the oxygen used during your procedure.  There is no need for concern and it should clear up in a day or so.  SYMPTOMS TO REPORT IMMEDIATELY:     Following upper endoscopy (EGD)  Vomiting of blood or coffee ground material  New chest pain or pain under the shoulder blades  Painful or persistently difficult swallowing  New shortness of breath  Fever of 100F or higher  Black, tarry-looking stools  For urgent or emergent issues, a gastroenterologist can be reached at any hour by calling [phone].   DIET:   FOLLOW DILATION HANDOUT.  ACTIVITY:  You should plan to take it easy for the rest of today and you should NOT DRIVE or use heavy machinery until tomorrow (because of the sedation medicines used during the test).    FOLLOW UP: Our staff will call the number listed on your records the next business day following your procedure  to check on you and address any questions or concerns that you may have regarding the information given to you following your procedure. If we do not reach you, we will leave a message.  However, if you are feeling well and you are not experiencing any problems, there is no need to return our call.  We will assume that you have returned to your regular daily activities without incident.  If any biopsies were taken you will be contacted by phone or by letter within the next 1-3 weeks.  Please call us at [phone] if you have not heard about the biopsies in 3 weeks.    SIGNATURES/CONFIDENTIALITY: You and/or your care partner have signed paperwork which will be entered into your electronic medical record.  These signatures attest to the fact that that the information above on your After Visit Summary has been reviewed and is understood.  Full responsibility of the confidentiality of this discharge information lies with you and/or your care-partner.  Resume ELIQUIS tomorrow,continue remainder of medications today. Dilation handout given.

## 2015-03-23 ENCOUNTER — Telehealth: Payer: Self-pay | Admitting: *Deleted

## 2015-03-23 NOTE — Telephone Encounter (Signed)
  Follow up Call-  Call back number 03/22/2015 02/20/2013  Post procedure Call Back phone  # [phone]  Permission to leave phone message Yes Yes     Patient questions:  Do you have a fever, pain , or abdominal swelling? No. Pain Score  0 *  Have you tolerated food without any problems? Yes.    Have you been able to return to your normal activities? Yes.    Do you have any questions about your discharge instructions: Diet   No. Medications  No. Follow up visit  No.  Do you have questions or concerns about your Care? No.  Actions: * If pain score is 4 or above: No action needed, pain <4.

## 2015-04-19 ENCOUNTER — Encounter: Payer: Self-pay | Admitting: Vascular Surgery

## 2015-04-20 ENCOUNTER — Ambulatory Visit (INDEPENDENT_AMBULATORY_CARE_PROVIDER_SITE_OTHER): Payer: Medicare Other | Admitting: Family

## 2015-04-20 ENCOUNTER — Ambulatory Visit (HOSPITAL_COMMUNITY)
Admission: RE | Admit: 2015-04-20 | Discharge: 2015-04-20 | Disposition: A | Discharge status: Home / Self Care | Payer: Medicare Other | Source: Ambulatory Visit | Attending: Vascular Surgery | Admitting: Vascular Surgery

## 2015-04-20 ENCOUNTER — Encounter: Payer: Self-pay | Admitting: Family

## 2015-04-20 VITALS — BP 154/76 | HR 88 | Temp 97.0°F | Resp 18 | Ht 67.0 in | Wt 215.0 lb

## 2015-04-20 DIAGNOSIS — I6523 Occlusion and stenosis of bilateral carotid arteries: Secondary | ICD-10-CM | POA: Insufficient documentation

## 2015-04-20 DIAGNOSIS — Z9889 Other specified postprocedural states: Secondary | ICD-10-CM

## 2015-04-20 NOTE — Patient Instructions (Signed)
Stroke Prevention Some medical conditions and behaviors are associated with an increased chance of having a stroke. You may prevent a stroke by making healthy choices and managing medical conditions. HOW CAN I REDUCE MY RISK OF HAVING A STROKE?   Stay physically active. Get at least 30 minutes of activity on most or all days.  Do not smoke. It may also be helpful to avoid exposure to secondhand smoke.  Limit alcohol use. Moderate alcohol use is considered to be:  No more than 2 drinks per day for men.  No more than 1 drink per day for nonpregnant women.  Eat healthy foods. This involves:  Eating 5 or more servings of fruits and vegetables a day.  Making dietary changes that address high blood pressure (hypertension), high cholesterol, diabetes, or obesity.  Manage your cholesterol levels.  Making food choices that are high in fiber and low in saturated fat, trans fat, and cholesterol may control cholesterol levels.  Take any prescribed medicines to control cholesterol as directed by your health care provider.  Manage your diabetes.  Controlling your carbohydrate and sugar intake is recommended to manage diabetes.  Take any prescribed medicines to control diabetes as directed by your health care provider.  Control your hypertension.  Making food choices that are low in salt (sodium), saturated fat, trans fat, and cholesterol is recommended to manage hypertension.  Ask your health care provider if you need treatment to lower your blood pressure. Take any prescribed medicines to control hypertension as directed by your health care provider.  If you are 18-39 years of age, have your blood pressure checked every 3-5 years. If you are 40 years of age or older, have your blood pressure checked every year.  Maintain a healthy weight.  Reducing calorie intake and making food choices that are low in sodium, saturated fat, trans fat, and cholesterol are recommended to manage  weight.  Stop drug abuse.  Avoid taking birth control pills.  Talk to your health care provider about the risks of taking birth control pills if you are over 35 years old, smoke, get migraines, or have ever had a blood clot.  Get evaluated for sleep disorders (sleep apnea).  Talk to your health care provider about getting a sleep evaluation if you snore a lot or have excessive sleepiness.  Take medicines only as directed by your health care provider.  For some people, aspirin or blood thinners (anticoagulants) are helpful in reducing the risk of forming abnormal blood clots that can lead to stroke. If you have the irregular heart rhythm of atrial fibrillation, you should be on a blood thinner unless there is a good reason you cannot take them.  Understand all your medicine instructions.  Make sure that other conditions (such as anemia or atherosclerosis) are addressed. SEEK IMMEDIATE MEDICAL CARE IF:   You have sudden weakness or numbness of the face, arm, or leg, especially on one side of the body.  Your face or eyelid droops to one side.  You have sudden confusion.  You have trouble speaking (aphasia) or understanding.  You have sudden trouble seeing in one or both eyes.  You have sudden trouble walking.  You have dizziness.  You have a loss of balance or coordination.  You have a sudden, severe headache with no known cause.  You have new chest pain or an irregular heartbeat. Any of these symptoms may represent a serious problem that is an emergency. Do not wait to see if the symptoms will   go away. Get medical help at once. Call your local emergency services (911 in [place] Do not drive yourself to the hospital.   This information is not intended to replace advice given to you by your health care provider. Make sure you discuss any questions you have with your health care provider.   Document Released: 04/26/2004 Document Revised: 04/09/2014 Document Reviewed:  09/19/2012 Elsevier Interactive Patient Education 2016 [employer].  

## 2015-04-20 NOTE — Progress Notes (Signed)
Chief Complaint: Extracranial Carotid Artery Stenosis   History of Present Illness  Fernando Moyer is a 82 y.o. male patient of Dr. Scot Dock who is s/p right carotid endarterectomy in May 2012. We've been following a moderate left carotid stenosis. He comes in for a 1 year follow up visit.   Of note, he does not take aspirin because of a medical reason. He's not sure exactly what happened but he was told to stop taking aspirin and also not to take nitroglycerin.  He has a small AAA that his cardiologist has been following (review of records, see report below). He had his aortic valve replaced in 2013, (bovine sourced, per pt). Dr. Burt Knack notes on 12/13/14 that pt has paroxysmal atrial fib.  He has known lumbar spine issues with radiculopathy sx's in both legs; states he has tried ESI's with little success per pt; states he is not a surgery candidate for this.   The patient denies any history of TIA or stroke symptoms, specifically the patient denies a history of amaurosis fugax or monocular blindness, denies a history unilateral  of facial drooping, denies a history of hemiplegia, and denies a history of receptive or expressive aphasia.   He has not had dizziness "in a long time".  He reports tingling and numbness in both 5th fingers, left more so than right. He denies pain or cold sensation in either UE.   The patient denies New Medical or Surgical History.  Pt Diabetic: yes  A1C was 6.5 on 10/19/14 (reveiw of records) Pt smoker: quit in 1989, started about age 15  Pt meds include: Statin : yes ASA: no Other anticoagulants/antiplatelets: Eliquis, paroxysmal atrial fib.    Past Medical History  Diagnosis Date  . GERD with stricture     w/hx of stricture  . PVD (peripheral vascular disease) ([place]) 12/07    per cath 12/07....iliac  . Carotid artery occlusion     last u/s 3-11...Marland Kitchenper vasc.surgery 2007 cath: nonobstructive CAD. mil;d MS, trivial AoS, small AAA,  . Bowen's disease      right arm BX: referred to dermatology  . Hyperlipidemia     takes Pravastatin daily  . AAA (abdominal aortic aneurysm) ([place]) 05/2008    PER CARDIAC CATH  . Aortic stenosis 05/2008    PER CARDIAC CATH, MILD, MITRAL STENOSIS  . Mitral stenosis 05/2008    PER CARDIAC CATH  . Coronary artery disease 05/2008    MILD, MEDICAL MANAGEMENT  . Pulmonary hypertension ([place])   . Dyspnea      w/u included a cath and PFT's, sxms thought to be pulmonary  . Chronic back pain   . Peripheral neuropathy ([facility])     right thigh; "it's been that way for years"  . Benign neoplasm of colon   . Stricture and stenosis of esophagus   . Myocardial infarction ([place]) 2012  . COPD (chronic obstructive pulmonary disease) ([place])   . Dizziness     takes Meclizine daily  . Back pain     cause unknown but does get Cortisone injection every now and then  . Blood transfusion without reported diagnosis   . Cataract   . Diabetes mellitus ([place]) 09/07/2011    no per pt- no medications    Social History Social History  Substance Use Topics  . Smoking status: Former Smoker -- 2.00 packs/day for 30 years    Types: Cigarettes  . Smokeless tobacco: Never Used     Comment: quit smoking in 1989  .  Alcohol Use: No    Family History Family History  Problem Relation Age of Onset  . Heart attack Father 58    MI  . Diabetes Neg Hx   . Colon cancer Neg Hx   . Anesthesia problems Neg Hx   . Hypotension Neg Hx   . Malignant hyperthermia Neg Hx   . Pseudochol deficiency Neg Hx   . Stomach cancer Neg Hx   . Esophageal cancer Neg Hx   . Rectal cancer Neg Hx   . Prostate cancer Brother 58    Surgical History Past Surgical History  Procedure Laterality Date  . Appendectomy    . Esophageal dilation    . Cardiac catheterization  02/2011    LAD 50, CFX OK, RCA40, EF 55%; PCWP 17, AoV  0.9 cm squared  . Endarterectomy  08-2010    Right carotid endarterectomy    . Aortic valve replacement  08/17/2011    Procedure: AORTIC  VALVE REPLACEMENT (AVR);  Surgeon: Gaye Pollack, MD;  Location: [facility];  Service: Open Heart Surgery;  Laterality: N/A;  . Colonoscopy    . Sternal wires removal N/A 05/01/2013    Procedure: STERNAL WIRES REMOVAL;  Surgeon: Gaye Pollack, MD;  Location: [facility];  Service: Thoracic;  Laterality: N/A;  . Left and right heart catheterization with coronary angiogram N/A 02/23/2011    Procedure: LEFT AND RIGHT HEART CATHETERIZATION WITH CORONARY ANGIOGRAM;  Surgeon: [name], MD;  Location: [facility];  Service: Cardiovascular;  Laterality: N/A;  . Eye surgery  10/14/13    cataract surgery L eye     No Known Allergies  Current Outpatient Prescriptions  Medication Sig Dispense Refill  . acetaminophen (TYLENOL) 500 MG tablet Take 1,000 mg by mouth daily as needed (pain).    Marland Kitchen apixaban (ELIQUIS) 5 MG TABS tablet Take 1 tablet (5 mg total) by mouth 2 (two) times daily. 180 tablet 2  . furosemide (LASIX) 40 MG tablet Take 1 tablet (40 mg total) by mouth daily. 90 tablet 2  . meclizine (ANTIVERT) 25 MG tablet TAKE 1 TABLET 3 TIMES A DAY AS NEEDED FOR DIZZINESS 30 tablet 1  . omeprazole (PRILOSEC) 40 MG capsule Take 40 mg by mouth 2 (two) times daily.    . potassium chloride SA (KLOR-CON M20) 20 MEQ tablet Take 1 tablet (20 mEq total) by mouth daily. 90 tablet 2  . pravastatin (PRAVACHOL) 40 MG tablet Take 1 tablet (40 mg total) by mouth daily. 90 tablet 2  . tiotropium (SPIRIVA HANDIHALER) 18 MCG inhalation capsule Place 1 capsule (18 mcg total) into inhaler and inhale daily. 90 capsule 1  . traMADol (ULTRAM) 50 MG tablet Take 1 tablet (50 mg total) by mouth every 12 (twelve) hours as needed. for pain 60 tablet 2  . oxyCODONE-acetaminophen (PERCOCET) 10-325 MG per tablet Take 1 tablet by mouth daily as needed. Reported on 04/20/2015  0   No current facility-administered medications for this visit.    Review of Systems : See HPI for pertinent positives and negatives.  Physical  Examination  Filed Vitals:   04/20/15 1010 04/20/15 1016 04/20/15 1017  BP: 108/66 158/84 154/76  Pulse: 90 88 88  Temp: 97 F (36.1 C)    Resp: 18    Height: 5\' 7"  (1.702 m)    Weight: 215 lb (97.523 kg)    SpO2: 94%     Body mass index is 33.67 kg/(m^2).  General: WDWN obese male in NAD GAIT: normal Eyes:  PERRLA Pulmonary:  Non-labored, CTAB, no  rales,  rhonchi, or wheezing.  Cardiac: irregular rhythm,  no detected murmur.  VASCULAR EXAM Carotid Bruits Right Left   Positive Positive    Aorta is not palpable. Radial pulses are 2+ right and 1+ left palpable                                                                                                                           LE Pulses Right Left       POPLITEAL  not palpable   not palpable       POSTERIOR TIBIAL   Not palpable   not palpable        DORSALIS PEDIS      ANTERIOR TIBIAL  not palpable   not palpable     Gastrointestinal: soft, nontender, BS WNL, no r/g,  no palpable masses.  Musculoskeletal: no muscle atrophy/wasting. M/S 5/5 throughout, extremities without ischemic changes  Neurologic: A&O X 3; Appropriate Affect, Speech is normal CN 2-12 intact, pain and light touch intact in extremities, Motor exam as listed above.   12/04/13 AAA Duplex at [facility], requested by Dr. Burt Knack: Technically challenging study. Probable stable dimensions of infrarenal AAA, measuring 3.3 cm x 3.6 cm. Normal caliber common and external iliac arteries, bilaterally. Chronic right common iliac artery occlusion. >50% stenosis of the right external and left common iliac arteries. Patent left external iliac artery.    Non-Invasive Vascular Imaging CAROTID DUPLEX 04/20/2015   CEREBROVASCULAR DUPLEX EVALUATION    INDICATION: Carotid artery disease     PREVIOUS INTERVENTION(S): Right carotid endarterectomy 08/01/2010    DUPLEX EXAM:     RIGHT  LEFT  Peak Systolic Velocities (cm/s) End Diastolic Velocities (cm/s) Plaque  LOCATION Peak Systolic Velocities (cm/s) End Diastolic Velocities (cm/s) Plaque  97 8  CCA PROXIMAL 94 14   90 11 HT CCA MID 88 13 HT  105 10  CCA DISTAL 68 13 HT  128 0  ECA 431 18 CP  104 14  ICA PROXIMAL 80 18 CP  92 17  ICA MID 159 40 CP  126 20  ICA DISTAL 121 22     NA ICA / CCA Ratio (PSV) 1.80  Antegrade  Vertebral Flow Retrograde   0000000 Brachial Systolic Pressure (mmHg) [id number]  Biphasic (Subclavian artery) Brachial Artery Waveforms Biphasic (Subclavian artery)    Plaque Morphology:  HM = Homogeneous, HT = Heterogeneous, CP = Calcific Plaque, SP = Smooth Plaque, IP = Irregular Plaque     ADDITIONAL FINDINGS: Resistive flow noted throughout the bilateral carotid arteries. Retrograde flow noted in the left vertebral artery with a known difference in brachial pressures.    IMPRESSION: Patent right carotid endarterectomy site with no evidence of hyperplasia or restenosis.  Left internal carotid artery velocities suggest a 40-59% stenosis.  Left external carotid artery stenosis.    Compared to the previous exam:  No significant change in comparison to the last exam on 04/14/2014.  Assessment: SIMBA ARTINO is a 82 y.o. male who is s/p right carotid endarterectomy in May 2012. We've been following a moderate left carotid stenosis.  He has no hx of stroke or TIA. Today's carotid duplex suggests a patent right carotid endarterectomy site with no evidence of hyperplasia or restenosis.  Left internal carotid artery velocities suggest a 40-59% stenosis. No significant change in comparison to the last exam on 04/14/2014.  His atherosclerotic risk factors include diet controlled DM, mild CAD, former smoker, obesity, and paroxysmal atrial fib on Eliquis.    Plan: Follow-up in 1 year with Carotid Duplex scan.   I discussed in depth with the patient the nature of atherosclerosis, and emphasized the importance of maximal medical management including strict control of blood  pressure, blood glucose, and lipid levels, obtaining regular exercise, and continued cessation of smoking.  The patient is aware that without maximal medical management the underlying atherosclerotic disease process will progress, limiting the benefit of any interventions. The patient was given information about stroke prevention and what symptoms should prompt the patient to seek immediate medical care. Thank you for allowing [place] to participate in this patient's care.  Clemon Chambers, RN, MSN, FNP-C [facility] Office: [phone]  Clinic Physician: Scot Dock  04/20/2015 10:20 AM

## 2015-04-20 NOTE — Progress Notes (Signed)
Filed Vitals:   04/20/15 1010 04/20/15 1016 04/20/15 1017  BP: 108/66 158/84 154/76  Pulse: 90 88 88  Temp: 97 F (36.1 C)    Resp: 18    Height: 5\' 7"  (1.702 m)    Weight: 215 lb (97.523 kg)    SpO2: 94%

## 2015-04-21 ENCOUNTER — Encounter: Payer: Self-pay | Admitting: Internal Medicine

## 2015-04-21 ENCOUNTER — Ambulatory Visit (INDEPENDENT_AMBULATORY_CARE_PROVIDER_SITE_OTHER): Payer: Medicare Other | Admitting: Internal Medicine

## 2015-04-21 VITALS — BP 142/56 | HR 56 | Temp 98.1°F | Ht 67.0 in | Wt 217.0 lb

## 2015-04-21 DIAGNOSIS — Z23 Encounter for immunization: Secondary | ICD-10-CM

## 2015-04-21 DIAGNOSIS — E119 Type 2 diabetes mellitus without complications: Secondary | ICD-10-CM | POA: Diagnosis not present

## 2015-04-21 DIAGNOSIS — I6523 Occlusion and stenosis of bilateral carotid arteries: Secondary | ICD-10-CM

## 2015-04-21 DIAGNOSIS — J438 Other emphysema: Secondary | ICD-10-CM | POA: Diagnosis not present

## 2015-04-21 LAB — BASIC METABOLIC PANEL WITH GFR
BUN: 14 mg/dL (ref 6–23)
CO2: 28 meq/L (ref 19–32)
Calcium: 9 mg/dL (ref 8.4–10.5)
Chloride: 101 meq/L (ref 96–112)
Creatinine, Ser: 0.99 mg/dL (ref 0.40–1.50)
GFR: 77.35 mL/min
Glucose, Bld: 148 mg/dL — ABNORMAL HIGH (ref 70–99)
Potassium: 4 meq/L (ref 3.5–5.1)
Sodium: 138 meq/L (ref 135–145)

## 2015-04-21 LAB — MICROALBUMIN / CREATININE URINE RATIO
Creatinine,U: 13.5 mg/dL
MICROALB UR: 0.7 mg/dL (ref 0.0–1.9)
Microalb Creat Ratio: 5.2 mg/g (ref 0.0–30.0)

## 2015-04-21 LAB — HEMOGLOBIN A1C: Hgb A1c MFr Bld: 7 % — ABNORMAL HIGH (ref 4.6–6.5)

## 2015-04-21 NOTE — Addendum Note (Signed)
Addended by: Mena Goes on: 04/21/2015 03:43 PM   Modules accepted: Orders

## 2015-04-21 NOTE — Progress Notes (Signed)
Subjective:    Patient ID: Fernando Moyer, male    DOB: 1933/12/31, 82 y.o.   MRN: [id number]  DOS:  04/21/2015 Type of visit - description : Routine check up, here with his granddaughter Interval history: COPD: On Spiriva, essentially asymptomatic. DM: due for a A1c, on diet control GERD with stricture: Had recently a EGD and stretching, still has mild symptoms.   Review of Systems Denies chest pain No blood in the stools or abdominal pain. No frequent or unusual cough. No wheezing. No gross hematuria  Past Medical History  Diagnosis Date  . GERD with stricture     w/hx of stricture  . PVD (peripheral vascular disease) ([place]) 12/07    per cath 12/07....iliac  . Carotid artery occlusion     last u/s 3-11...Marland Kitchenper vasc.surgery 2007 cath: nonobstructive CAD. mil;d MS, trivial AoS, small AAA,  . Bowen's disease     right arm BX: referred to dermatology  . Hyperlipidemia     takes Pravastatin daily  . AAA (abdominal aortic aneurysm) ([place]) 05/2008    PER CARDIAC CATH  . Aortic stenosis 05/2008    PER CARDIAC CATH, MILD, MITRAL STENOSIS  . Mitral stenosis 05/2008    PER CARDIAC CATH  . Coronary artery disease 05/2008    MILD, MEDICAL MANAGEMENT  . Pulmonary hypertension ([place])   . Dyspnea      w/u included a cath and PFT's, sxms thought to be pulmonary  . Chronic back pain   . Peripheral neuropathy (HCC)     right thigh; "it's been that way for years"  . Benign neoplasm of colon   . Stricture and stenosis of esophagus   . Myocardial infarction ([place]) 2012  . COPD (chronic obstructive pulmonary disease) ([place])   . Dizziness     takes Meclizine daily  . Back pain     cause unknown but does get Cortisone injection every now and then  . Blood transfusion without reported diagnosis   . Cataract   . Diabetes mellitus ([place]) 09/07/2011    no per pt- no medications    Past Surgical History  Procedure Laterality Date  . Appendectomy    . Esophageal dilation    . Cardiac  catheterization  02/2011    LAD 50, CFX OK, RCA40, EF 55%; PCWP 17, AoV  0.9 cm squared  . Endarterectomy  08-2010    Right carotid endarterectomy    . Aortic valve replacement  08/17/2011    Procedure: AORTIC VALVE REPLACEMENT (AVR);  Surgeon: Gaye Pollack, MD;  Location: [facility];  Service: Open Heart Surgery;  Laterality: N/A;  . Colonoscopy    . Sternal wires removal N/A 05/01/2013    Procedure: STERNAL WIRES REMOVAL;  Surgeon: Gaye Pollack, MD;  Location: [facility];  Service: Thoracic;  Laterality: N/A;  . Left and right heart catheterization with coronary angiogram N/A 02/23/2011    Procedure: LEFT AND RIGHT HEART CATHETERIZATION WITH CORONARY ANGIOGRAM;  Surgeon: [name], MD;  Location: [facility] CATH LAB;  Service: Cardiovascular;  Laterality: N/A;  . Eye surgery  10/14/13    cataract surgery L eye     Social History   Social History  . Marital Status: Widowed    Spouse Name: N/A  . Number of Children: 2  . Years of Education: N/A   Occupational History  . 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 Smoking status: Former Smoker -- 2.00 packs/day for 30 years    Types: Cigarettes  . Smokeless tobacco: Never Used     Comment: quit smoking in 1989  . Alcohol Use: No  . Drug Use: No  . Sexual Activity: No   Other Topics Concern  . Not on file   Social History Narrative   LIVES BY HIMSELF    HAS 2 GROWN SONS, 5 G-KIDS (Melissa usually comes with him for his appointments)               Medication List       This list is accurate as of: 04/21/15 11:59 PM.  Always use your most recent med list.               acetaminophen 500 MG tablet  Commonly known as:  TYLENOL  Take 1,000 mg by mouth daily as needed (pain). Reported on 04/21/2015     apixaban 5 MG Tabs tablet  Commonly known as:  ELIQUIS  Take 1 tablet (5 mg total) by mouth 2 (two) times daily.     furosemide 40 MG tablet    Commonly known as:  LASIX  Take 1 tablet (40 mg total) by mouth daily.     meclizine 25 MG tablet  Commonly known as:  ANTIVERT  TAKE 1 TABLET 3 TIMES A DAY AS NEEDED FOR DIZZINESS     omeprazole 40 MG capsule  Commonly known as:  PRILOSEC  Take 40 mg by mouth 2 (two) times daily.     oxyCODONE-acetaminophen 10-325 MG tablet  Commonly known as:  PERCOCET  Take 1 tablet by mouth daily as needed. Reported on 04/21/2015     potassium chloride SA 20 MEQ tablet  Commonly known as:  KLOR-CON M20  Take 1 tablet (20 mEq total) by mouth daily.     pravastatin 40 MG tablet  Commonly known as:  PRAVACHOL  Take 1 tablet (40 mg total) by mouth daily.     tiotropium 18 MCG inhalation capsule  Commonly known as:  SPIRIVA HANDIHALER  Place 1 capsule (18 mcg total) into inhaler and inhale daily.     traMADol 50 MG tablet  Commonly known as:  ULTRAM  Take 1 tablet (50 mg total) by mouth every 12 (twelve) hours as needed. for pain           Objective:   Physical Exam BP 142/56 mmHg  Pulse 56  Temp(Src) 98.1 F (36.7 C) (Oral)  Ht 5\' 7"  (1.702 m)  Wt 217 lb (98.431 kg)  BMI 33.98 kg/m2  SpO2 96% General:   Well developed, well nourished . NAD.  HEENT:  Normocephalic . Face symmetric, atraumatic Lungs:  CTA B Normal respiratory effort, no intercostal retractions, no accessory muscle use. Heart: Seems regular, + soft systolic murmur.  no pretibial edema bilaterally  Abdomen:  Not distended, soft, non-tender.  Skin: Not pale. Not jaundice Neurologic:  alert & oriented X3.  Speech normal, gait appropriate for age and unassisted Psych--  Cognition and judgment appear intact.  Cooperative with normal attention span and concentration.  Behavior appropriate. No anxious or depressed appearing.    Assessment & Plan:   Assessment DM  w/ neuropathy (feet burning, nl exam) COPD Hyperlipidemia GERD with stricture CV: --CAD --Carotid artery disease-- R CEA 2012 --A. Fib--  paroxysmal - eliquis  --Aortic valve replacement (bio) --PVD: AAA, iliAortic valve, subclavian artery stenosis  MSK-- back pain, sees Dr Nelva Bush  skin  cancer  PLAN DM: On diet control, check an A1c and micro. Also BMP.  Not on  ace inhibitors, if micro +, consider arb/ace-i  COPD: oligosymptomatic. Flu shot today Hyperlipidemia: Well-controlled per last FLP.   RTC 4 months, CPX

## 2015-04-21 NOTE — Patient Instructions (Signed)
BEFORE YOU LEAVE THE OFFICE:  GO TO THE LAB  Get the blood work    GO TO THE FRONT DESK Schedule a complete physical exam to be done in 4 months  Please be fasting

## 2015-04-21 NOTE — Progress Notes (Signed)
Pre visit review using our clinic review tool, if applicable. No additional management support is needed unless otherwise documented below in the visit note. 

## 2015-05-23 ENCOUNTER — Other Ambulatory Visit: Payer: Self-pay | Admitting: Internal Medicine

## 2015-05-23 ENCOUNTER — Other Ambulatory Visit: Payer: Self-pay | Admitting: Cardiovascular Disease

## 2015-05-23 NOTE — Telephone Encounter (Signed)
Rx request sent to pharmacy.  

## 2015-06-17 ENCOUNTER — Emergency Department (HOSPITAL_BASED_OUTPATIENT_CLINIC_OR_DEPARTMENT_OTHER)
Admission: EM | Admit: 2015-06-17 | Discharge: 2015-06-17 | Disposition: A | Discharge status: Home / Self Care | Payer: Medicare Other | Attending: Emergency Medicine | Admitting: Emergency Medicine

## 2015-06-17 ENCOUNTER — Encounter (HOSPITAL_BASED_OUTPATIENT_CLINIC_OR_DEPARTMENT_OTHER): Payer: Self-pay

## 2015-06-17 DIAGNOSIS — E119 Type 2 diabetes mellitus without complications: Secondary | ICD-10-CM | POA: Diagnosis not present

## 2015-06-17 DIAGNOSIS — Y9389 Activity, other specified: Secondary | ICD-10-CM | POA: Diagnosis not present

## 2015-06-17 DIAGNOSIS — Z87891 Personal history of nicotine dependence: Secondary | ICD-10-CM | POA: Diagnosis not present

## 2015-06-17 DIAGNOSIS — X58XXXA Exposure to other specified factors, initial encounter: Secondary | ICD-10-CM | POA: Diagnosis not present

## 2015-06-17 DIAGNOSIS — Y998 Other external cause status: Secondary | ICD-10-CM | POA: Insufficient documentation

## 2015-06-17 DIAGNOSIS — G8929 Other chronic pain: Secondary | ICD-10-CM | POA: Insufficient documentation

## 2015-06-17 DIAGNOSIS — H5711 Ocular pain, right eye: Secondary | ICD-10-CM

## 2015-06-17 DIAGNOSIS — Z79899 Other long term (current) drug therapy: Secondary | ICD-10-CM | POA: Insufficient documentation

## 2015-06-17 DIAGNOSIS — Z86018 Personal history of other benign neoplasm: Secondary | ICD-10-CM | POA: Insufficient documentation

## 2015-06-17 DIAGNOSIS — E785 Hyperlipidemia, unspecified: Secondary | ICD-10-CM | POA: Insufficient documentation

## 2015-06-17 DIAGNOSIS — Z9889 Other specified postprocedural states: Secondary | ICD-10-CM | POA: Insufficient documentation

## 2015-06-17 DIAGNOSIS — J449 Chronic obstructive pulmonary disease, unspecified: Secondary | ICD-10-CM | POA: Diagnosis not present

## 2015-06-17 DIAGNOSIS — Y9289 Other specified places as the place of occurrence of the external cause: Secondary | ICD-10-CM | POA: Diagnosis not present

## 2015-06-17 DIAGNOSIS — K219 Gastro-esophageal reflux disease without esophagitis: Secondary | ICD-10-CM | POA: Diagnosis not present

## 2015-06-17 DIAGNOSIS — I2510 Atherosclerotic heart disease of native coronary artery without angina pectoris: Secondary | ICD-10-CM | POA: Insufficient documentation

## 2015-06-17 DIAGNOSIS — I252 Old myocardial infarction: Secondary | ICD-10-CM | POA: Diagnosis not present

## 2015-06-17 DIAGNOSIS — S0591XA Unspecified injury of right eye and orbit, initial encounter: Secondary | ICD-10-CM | POA: Diagnosis present

## 2015-06-17 DIAGNOSIS — S0501XA Injury of conjunctiva and corneal abrasion without foreign body, right eye, initial encounter: Secondary | ICD-10-CM

## 2015-06-17 DIAGNOSIS — Z7901 Long term (current) use of anticoagulants: Secondary | ICD-10-CM | POA: Insufficient documentation

## 2015-06-17 MED ORDER — ERYTHROMYCIN 5 MG/GM OP OINT: 1.0000 "application " | TOPICAL_OINTMENT | Freq: Four times a day (QID) | OPHTHALMIC | Status: DC

## 2015-06-17 MED ORDER — FLUORESCEIN SODIUM 1 MG OP STRP
1.0000 | ORAL_STRIP | Freq: Once | OPHTHALMIC | Status: AC
  Administered 2015-06-17: 1 via OPHTHALMIC
  Filled 2015-06-17: qty 1

## 2015-06-17 MED ORDER — TETRACAINE HCL 0.5 % OP SOLN
2.0000 [drp] | Freq: Once | OPHTHALMIC | Status: AC
  Administered 2015-06-17: 2 [drp] via OPHTHALMIC
  Filled 2015-06-17: qty 4

## 2015-06-17 NOTE — ED Provider Notes (Signed)
CSN: [id number]     Arrival date & time 06/17/15  1438 History   First MD Initiated Contact with Patient 06/17/15 1518     Chief Complaint  Patient presents with  . Eye Pain   (Consider location/radiation/quality/duration/timing/severity/associated sxs/prior Treatment) HPI 82 y.o. male presents to the Emergency Department today complaining of right eye pain x 1 week. Unsure of foreign body. Notes pain is 5/10 and hurts with movement of eye. No fevers. No loss of vision. No vision change. Has not tried any OTC. Attempted to wash out eye several times before he came here. No other symptoms noted.   Past Medical History  Diagnosis Date  . GERD with stricture     w/hx of stricture  . PVD (peripheral vascular disease) ([place]) 12/07    per cath 12/07....iliac  . Carotid artery occlusion     last u/s 3-11...Marland Kitchenper vasc.surgery 2007 cath: nonobstructive CAD. mil;d MS, trivial AoS, small AAA,  . Bowen's disease     right arm BX: referred to dermatology  . Hyperlipidemia     takes Pravastatin daily  . AAA (abdominal aortic aneurysm) ([place]) 05/2008    PER CARDIAC CATH  . Aortic stenosis 05/2008    PER CARDIAC CATH, MILD, MITRAL STENOSIS  . Mitral stenosis 05/2008    PER CARDIAC CATH  . Coronary artery disease 05/2008    MILD, MEDICAL MANAGEMENT  . Pulmonary hypertension ([place])   . Dyspnea      w/u included a cath and PFT's, sxms thought to be pulmonary  . Chronic back pain   . Peripheral neuropathy (HCC)     right thigh; "it's been that way for years"  . Benign neoplasm of colon   . Stricture and stenosis of esophagus   . Myocardial infarction ([place]) 2012  . COPD (chronic obstructive pulmonary disease) ([place])   . Dizziness     takes Meclizine daily  . Back pain     cause unknown but does get Cortisone injection every now and then  . Blood transfusion without reported diagnosis   . Cataract   . Diabetes mellitus ([place]) 09/07/2011    no per pt- no medications   Past Surgical History  Procedure  Laterality Date  . Appendectomy    . Esophageal dilation    . Cardiac catheterization  02/2011    LAD 50, CFX OK, RCA40, EF 55%; PCWP 17, AoV  0.9 cm squared  . Endarterectomy  08-2010    Right carotid endarterectomy    . Aortic valve replacement  08/17/2011    Procedure: AORTIC VALVE REPLACEMENT (AVR);  Surgeon: Gaye Pollack, MD;  Location: [facility];  Service: Open Heart Surgery;  Laterality: N/A;  . Colonoscopy    . Sternal wires removal N/A 05/01/2013    Procedure: STERNAL WIRES REMOVAL;  Surgeon: Gaye Pollack, MD;  Location: [facility] OR;  Service: Thoracic;  Laterality: N/A;  . Left and right heart catheterization with coronary angiogram N/A 02/23/2011    Procedure: LEFT AND RIGHT HEART CATHETERIZATION WITH CORONARY ANGIOGRAM;  Surgeon: [name], MD;  Location: [facility] CATH LAB;  Service: Cardiovascular;  Laterality: N/A;  . Eye surgery  10/14/13    cataract surgery L eye    Family History  Problem Relation Age of Onset  . Heart attack Father 48    MI  . Diabetes Neg Hx   . Colon cancer Neg Hx   . Anesthesia problems Neg Hx   . Hypotension Neg Hx   . Malignant hyperthermia Neg  Hx   . Pseudochol deficiency Neg Hx   . Stomach cancer Neg Hx   . Esophageal cancer Neg Hx   . Rectal cancer Neg Hx   . Prostate cancer Brother 47   Social History  Substance Use Topics  . Smoking status: Former Smoker -- 2.00 packs/day for 30 years    Types: Cigarettes  . Smokeless tobacco: Never Used     Comment: quit smoking in 1989  . Alcohol Use: No    Review of Systems ROS reviewed and all are negative for acute change except as noted in the HPI.  Allergies  Review of patient's allergies indicates no known allergies.  Home Medications   Prior to Admission medications   Medication Sig Start Date End Date Taking? Authorizing Provider  acetaminophen (TYLENOL) 500 MG tablet Take 1,000 mg by mouth daily as needed (pain). Reported on 04/21/2015    Historical Provider, MD  ELIQUIS 5 MG TABS tablet  TAKE 1 TABLET TWICE A DAY 05/23/15   [name], MD  furosemide (LASIX) 40 MG tablet TAKE 1 TABLET DAILY 05/23/15   [name], MD  KLOR-CON M20 20 MEQ tablet TAKE 1 TABLET DAILY 05/23/15   [name], MD  meclizine (ANTIVERT) 25 MG tablet TAKE 1 TABLET 3 TIMES A DAY AS NEEDED FOR DIZZINESS    Colon Branch, MD  omeprazole (PRILOSEC) 40 MG capsule Take 40 mg by mouth 2 (two) times daily.    Historical Provider, MD  oxyCODONE-acetaminophen (PERCOCET) 10-325 MG per tablet Take 1 tablet by mouth daily as needed. Reported on 04/21/2015 12/02/14   Historical Provider, MD  pravastatin (PRAVACHOL) 40 MG tablet Take 1 tablet (40 mg total) by mouth daily. 10/19/14   Colon Branch, MD  tiotropium (SPIRIVA HANDIHALER) 18 MCG inhalation capsule Place 1 capsule (18 mcg total) into inhaler and inhale daily. 05/23/15   Colon Branch, MD  traMADol (ULTRAM) 50 MG tablet Take 1 tablet (50 mg total) by mouth every 12 (twelve) hours as needed. for pain 12/15/14   Colon Branch, MD   BP 192/69 mmHg  Pulse 87  Temp(Src) 97.5 F (36.4 C) (Oral)  Resp 18  Ht 5\' 7"  (1.702 m)  Wt 99.338 kg  BMI 34.29 kg/m2  SpO2 98% Physical Exam  Constitutional: He is oriented to person, place, and time. He appears well-developed and well-nourished.  HENT:  Head: Normocephalic and atraumatic.  Eyes: EOM are normal. Pupils are equal, round, and reactive to light. Lids are everted and swept, no foreign bodies found. No foreign body present in the right eye. Right conjunctiva is injected. Right eye exhibits normal extraocular motion and no nystagmus.  Fundoscopic exam:      The right eye shows no papilledema.  Slit lamp exam:      The right eye shows corneal abrasion and fluorescein uptake. The right eye shows no corneal flare, no corneal ulcer and no foreign body.  Corneal Abrasion noted 6oclock   Cardiovascular: Normal rate and regular rhythm.   Pulmonary/Chest: Effort normal.  Abdominal: Soft.  Musculoskeletal: Normal range of motion.   Neurological: He is alert and oriented to person, place, and time.  Skin: Skin is warm and dry.  Psychiatric: He has a normal mood and affect. His behavior is normal. Thought content normal.  Nursing note and vitals reviewed.  3:52 PM Two drops of tetracaine/proparacaine instilled into affected eye.   Fluorescein strip applied to affected eye. Wood's lamp used to assess for corneal abrasion. Corneal abrasion  identified 6oclock. No foreign bodies noted. No visible hyphema.   Tonometry performed. Right eye pressure: 17   Patient tolerated procedure well without immediate complication.    ED Course  Procedures (including critical care time) Labs Review Labs Reviewed - No data to display  Imaging Review No results found. I have personally reviewed and evaluated these images and lab results as part of my medical decision-making.   EKG Interpretation None      MDM  I have reviewed the relevant previous healthcare records. I obtained HPI from historian. Patient discussed with supervising physician  ED Course:  Assessment: Pt is a 81yM who presents with right eye pain x1 week. On exam, pt in NAD. Nontoxic/nonseptic appearing. VSS. Afebrile. Right eye show mild injected conjunctiva. Fluorescein showed corneal abrasion at [facility]. Tonometry 17. Visual acuity unremarkable. No loss of vision. No acute pain. Plan is to DC home with ABX and follow up with Opthomology. At time of discharge, Patient is in no acute distress. Vital Signs are stable. Patient is able to ambulate. Patient able to tolerate PO.    Disposition/Plan:  DC Home Additional Verbal discharge instructions given and discussed with patient.  Pt Instructed to f/u with Opthalmology  Return precautions given Pt acknowledges and agrees with plan  Supervising Physician Quintella Reichert, MD   Final diagnoses:  None      Shary Decamp, PA-C 06/17/15 [place], MD 06/18/15 [phone]

## 2015-06-17 NOTE — Discharge Instructions (Signed)
Please read and follow all provided instructions.  Your diagnoses today include:  1. Eye pain, right   2. Corneal abrasion, right, initial encounter    Tests performed today include:  Vital signs. See below for your results today.   Medications prescribed:   Take medication as prescribed   Home care instructions:  Follow any educational materials contained in this packet.  Follow-up instructions: Please follow-up with your Opthalmologist as soon as you can for further evaluation     Return instructions:   Please return to the Emergency Department if you do not get better, if you get worse, or new symptoms OR  - Fever (temperature greater than 101.8F)  - Bleeding that does not stop with holding pressure to the area    -Severe pain (please note that you may be more sore the day after your accident)  - Chest Pain  - Difficulty breathing  - Severe nausea or vomiting  - Inability to tolerate food and liquids  - Passing out  - Skin becoming red around your wounds  - Change in mental status (confusion or lethargy)  - New numbness or weakness     Please return if you have any other emergent concerns.  Additional Information:  Your vital signs today were: BP 192/69 mmHg   Pulse 87   Temp(Src) 97.5 F (36.4 C) (Oral)   Resp 18   Ht 5\' 7"  (1.702 m)   Wt 99.338 kg   BMI 34.29 kg/m2   SpO2 98% If your blood pressure (BP) was elevated above 135/85 this visit, please have this repeated by your doctor within one month. ---------------

## 2015-06-17 NOTE — ED Notes (Signed)
Pain to right eye x 1 week-unsure if FB-NAD-steady gait

## 2015-06-23 DIAGNOSIS — H02001 Unspecified entropion of right upper eyelid: Secondary | ICD-10-CM | POA: Diagnosis not present

## 2015-06-23 DIAGNOSIS — H04123 Dry eye syndrome of bilateral lacrimal glands: Secondary | ICD-10-CM | POA: Diagnosis not present

## 2015-06-23 DIAGNOSIS — H02002 Unspecified entropion of right lower eyelid: Secondary | ICD-10-CM | POA: Diagnosis not present

## 2015-07-01 ENCOUNTER — Telehealth: Payer: Self-pay | Admitting: Cardiovascular Disease

## 2015-07-01 NOTE — Telephone Encounter (Signed)
Request for surgical clearance:  1. What type of surgery is being performed? Back injection   When is this surgery scheduled? 07/20/15  2. Are there any medications that need to be held prior to surgery and how long?Stop Eliquis 3 days prior   3. Name of physician performing surgery? Dr. Nelva Bush  4. What is your office phone and fax number? [phone] 5.

## 2015-07-01 NOTE — Telephone Encounter (Signed)
Pt on Eliquis for afib, no history of stroke or TIA. Ok to hold Eliquis x3 days prior to back injection and restart 24 hours procedure. Clearance faxed to Dr. Nelva Bush at [phone].

## 2015-07-15 ENCOUNTER — Encounter: Payer: Self-pay | Admitting: Internal Medicine

## 2015-07-18 NOTE — Telephone Encounter (Signed)
Pt is requesting refill on Tramadol.  Last OV: 04/21/2015 Last Fill: 12/15/2014 #60 and 2RF UDS: 10/19/2014 Low risk  Please advise.

## 2015-07-19 ENCOUNTER — Other Ambulatory Visit: Payer: Self-pay | Admitting: Internal Medicine

## 2015-07-19 DIAGNOSIS — H26492 Other secondary cataract, left eye: Secondary | ICD-10-CM | POA: Diagnosis not present

## 2015-07-19 DIAGNOSIS — H04123 Dry eye syndrome of bilateral lacrimal glands: Secondary | ICD-10-CM | POA: Diagnosis not present

## 2015-07-19 DIAGNOSIS — H25811 Combined forms of age-related cataract, right eye: Secondary | ICD-10-CM | POA: Diagnosis not present

## 2015-07-19 DIAGNOSIS — H524 Presbyopia: Secondary | ICD-10-CM | POA: Diagnosis not present

## 2015-07-19 MED ORDER — TRAMADOL HCL 50 MG PO TABS: 50.0000 mg | ORAL_TABLET | Freq: Two times a day (BID) | ORAL | Status: DC | PRN

## 2015-07-19 NOTE — Telephone Encounter (Signed)
Rx faxed to [facility] pharmacy.  

## 2015-07-19 NOTE — Telephone Encounter (Signed)
Printed #60, no RF

## 2015-07-20 DIAGNOSIS — M5136 Other intervertebral disc degeneration, lumbar region: Secondary | ICD-10-CM | POA: Diagnosis not present

## 2015-08-01 DIAGNOSIS — H02106 Unspecified ectropion of left eye, unspecified eyelid: Secondary | ICD-10-CM | POA: Diagnosis not present

## 2015-08-01 DIAGNOSIS — H02003 Unspecified entropion of right eye, unspecified eyelid: Secondary | ICD-10-CM | POA: Diagnosis not present

## 2015-08-02 DIAGNOSIS — Z08 Encounter for follow-up examination after completed treatment for malignant neoplasm: Secondary | ICD-10-CM | POA: Diagnosis not present

## 2015-08-02 DIAGNOSIS — Z85820 Personal history of malignant melanoma of skin: Secondary | ICD-10-CM | POA: Diagnosis not present

## 2015-08-02 DIAGNOSIS — L570 Actinic keratosis: Secondary | ICD-10-CM | POA: Diagnosis not present

## 2015-08-05 DIAGNOSIS — H02042 Spastic entropion of right lower eyelid: Secondary | ICD-10-CM | POA: Diagnosis not present

## 2015-08-05 DIAGNOSIS — C44300 Unspecified malignant neoplasm of skin of unspecified part of face: Secondary | ICD-10-CM | POA: Diagnosis not present

## 2015-08-05 DIAGNOSIS — H02102 Unspecified ectropion of right lower eyelid: Secondary | ICD-10-CM | POA: Diagnosis not present

## 2015-08-05 DIAGNOSIS — H02105 Unspecified ectropion of left lower eyelid: Secondary | ICD-10-CM | POA: Diagnosis not present

## 2015-08-19 ENCOUNTER — Other Ambulatory Visit: Payer: Self-pay | Admitting: Cardiovascular Disease

## 2015-08-22 ENCOUNTER — Encounter: Payer: Self-pay | Admitting: Internal Medicine

## 2015-08-22 ENCOUNTER — Ambulatory Visit (INDEPENDENT_AMBULATORY_CARE_PROVIDER_SITE_OTHER): Payer: Medicare Other | Admitting: Internal Medicine

## 2015-08-22 VITALS — BP 126/76 | HR 90 | Temp 97.8°F | Ht 67.0 in | Wt 214.2 lb

## 2015-08-22 DIAGNOSIS — R946 Abnormal results of thyroid function studies: Secondary | ICD-10-CM | POA: Diagnosis not present

## 2015-08-22 DIAGNOSIS — I6523 Occlusion and stenosis of bilateral carotid arteries: Secondary | ICD-10-CM

## 2015-08-22 DIAGNOSIS — R7989 Other specified abnormal findings of blood chemistry: Secondary | ICD-10-CM

## 2015-08-22 DIAGNOSIS — Z0000 Encounter for general adult medical examination without abnormal findings: Secondary | ICD-10-CM

## 2015-08-22 DIAGNOSIS — I480 Paroxysmal atrial fibrillation: Secondary | ICD-10-CM

## 2015-08-22 DIAGNOSIS — E785 Hyperlipidemia, unspecified: Secondary | ICD-10-CM | POA: Diagnosis not present

## 2015-08-22 DIAGNOSIS — E118 Type 2 diabetes mellitus with unspecified complications: Secondary | ICD-10-CM | POA: Diagnosis not present

## 2015-08-22 DIAGNOSIS — Z09 Encounter for follow-up examination after completed treatment for conditions other than malignant neoplasm: Secondary | ICD-10-CM

## 2015-08-22 NOTE — Assessment & Plan Note (Addendum)
Tdap--01/17/11; PNA--9/6/13[place] Border-- 2016 Shingles--09/30/13  CCS: 11.21.14 with Fernando Moyer at [facility]- sessile polyp removed (recommended f/u 11/17) Prostate cancer screening , had a DRE and PSA 2016, PSA wnl   Diet and exercise discussed

## 2015-08-22 NOTE — Patient Instructions (Signed)
Please schedule labs to be done within few days (fasting)  Also schedule a routine checkup 4 months from today, no fasting  Stop taking oxycodone, for pain take only tramadol      Fall Prevention and Home Safety Falls cause injuries and can affect all age groups. It is possible to use preventive measures to significantly decrease the likelihood of falls. There are many simple measures which can make your home safer and prevent falls. OUTDOORS  Repair cracks and edges of walkways and driveways.  Remove high doorway thresholds.  Trim shrubbery on the main path into your home.  Have good outside lighting.  Clear walkways of tools, rocks, debris, and clutter.  Check that handrails are not broken and are securely fastened. Both sides of steps should have handrails.  Have leaves, snow, and ice cleared regularly.  Use sand or salt on walkways during winter months.  In the garage, clean up grease or oil spills. BATHROOM  Install night lights.  Install grab bars by the toilet and in the tub and shower.  Use non-skid mats or decals in the tub or shower.  Place a plastic non-slip stool in the shower to sit on, if needed.  Keep floors dry and clean up all water on the floor immediately.  Remove soap buildup in the tub or shower on a regular basis.  Secure bath mats with non-slip, double-sided rug tape.  Remove throw rugs and tripping hazards from the floors. BEDROOMS  Install night lights.  Make sure a bedside light is easy to reach.  Do not use oversized bedding.  Keep a telephone by your bedside.  Have a firm chair with side arms to use for getting dressed.  Remove throw rugs and tripping hazards from the floor. KITCHEN  Keep handles on pots and pans turned toward the center of the stove. Use back burners when possible.  Clean up spills quickly and allow time for drying.  Avoid walking on wet floors.  Avoid hot utensils and knives.  Position shelves so  they are not too high or low.  Place commonly used objects within easy reach.  If necessary, use a sturdy step stool with a grab bar when reaching.  Keep electrical cables out of the way.  Do not use floor polish or wax that makes floors slippery. If you must use wax, use non-skid floor wax.  Remove throw rugs and tripping hazards from the floor. STAIRWAYS  Never leave objects on stairs.  Place handrails on both sides of stairways and use them. Fix any loose handrails. Make sure handrails on both sides of the stairways are as long as the stairs.  Check carpeting to make sure it is firmly attached along stairs. Make repairs to worn or loose carpet promptly.  Avoid placing throw rugs at the top or bottom of stairways, or properly secure the rug with carpet tape to prevent slippage. Get rid of throw rugs, if possible.  Have an electrician put in a light switch at the top and bottom of the stairs. OTHER FALL PREVENTION TIPS  Wear low-heel or rubber-soled shoes that are supportive and fit well. Wear closed toe shoes.  When using a stepladder, make sure it is fully opened and both spreaders are firmly locked. Do not climb a closed stepladder.  Add color or contrast paint or tape to grab bars and handrails in your home. Place contrasting color strips on first and last steps.  Learn and use mobility aids as needed. Install an Dealer  emergency response system.  Turn on lights to avoid dark areas. Replace light bulbs that burn out immediately. Get light switches that glow.  Arrange furniture to create clear pathways. Keep furniture in the same place.  Firmly attach carpet with non-skid or double-sided tape.  Eliminate uneven floor surfaces.  Select a carpet pattern that does not visually hide the edge of steps.  Be aware of all pets. OTHER HOME SAFETY TIPS  Set the water temperature for 120 F (48.8 C).  Keep emergency numbers on or near the telephone.  Keep smoke  detectors on every level of the home and near sleeping areas. Document Released: 03/09/2002 Document Revised: 09/18/2011 Document Reviewed: 06/08/2011 [facility] Patient Information 2015 [place], [place]. This information is not intended to replace advice given to you by your health care provider. Make sure you discuss any questions you have with your health care provider.   Preventive Care for Adults Ages 46 and over  Blood pressure check.** / Every 1 to 2 years.  Lipid and cholesterol check.**/ Every 5 years beginning at age 76.  Lung cancer screening. / Every year if you are aged 78-80 years and have a 30-pack-year history of smoking and currently smoke or have quit within the past 15 years. Yearly screening is stopped once you have quit smoking for at least 15 years or develop a health problem that would prevent you from having lung cancer treatment.  Fecal occult blood test (FOBT) of stool. / Every year beginning at age 63 and continuing until age 13. You may not have to do this test if you get a colonoscopy every 10 years.  Flexible sigmoidoscopy** or colonoscopy.** / Every 5 years for a flexible sigmoidoscopy or every 10 years for a colonoscopy beginning at age 16 and continuing until age 61.  Hepatitis C blood test.** / For all people born from [birth year] through 1963 and any individual with known risks for hepatitis C.  Abdominal aortic aneurysm (AAA) screening.** / A one-time screening for ages 59 to 63 years who are current or former smokers.  Skin self-exam. / Monthly.  Influenza vaccine. / Every year.  Tetanus, diphtheria, and acellular pertussis (Tdap/Td) vaccine.** / 1 dose of Td every 10 years.  Varicella vaccine.** / Consult your health care provider.  Zoster vaccine.** / 1 dose for adults aged 47 years or older.  Pneumococcal 13-valent conjugate (PCV13) vaccine.** / Consult your health care provider.  Pneumococcal polysaccharide (PPSV23) vaccine.** / 1 dose for all adults  aged 42 years and older.  Meningococcal vaccine.** / Consult your health care provider.  Hepatitis A vaccine.** / Consult your health care provider.  Hepatitis B vaccine.** / Consult your health care provider.  Haemophilus influenzae type b (Hib) vaccine.** / Consult your health care provider. **Family history and personal history of risk and conditions may change your health care provider's recommendations. Document Released: 05/15/2001 Document Revised: 03/24/2013 Document Reviewed: 08/14/2010 [facility] Patient Information 2015 [place], [place]. This information is not intended to replace advice given to you by your health care provider. Make sure you discuss any questions you have with your health care provider.

## 2015-08-22 NOTE — Progress Notes (Signed)
Subjective:    Patient ID: Fernando Moyer, male    DOB: April 30, 1933, 82 y.o.   MRN: [id number]  DOS:  08/22/2015 Type of visit - description :   Here for Medicare AWV:  1. Risk factors based on Past M, S, F history: reviewed 2. Physical Activities:  Sedentary  D/t back pain 3. Depression/mood: neg screening     4. Hearing:  Patient got [employer] hearing aids 09/18/13  5. ADL's: independent, drives, still works   76. Fall Risk: no recent falls, prevention discussed   7. home Safety: does feel safe at home   8. Height, weight, & visual acuity: see VS,  s/p cataract surgery 10/14/13 on L eye,  Sees Dr. Bing Plume  9. Counseling: provided 10. Labs ordered based on risk factors: if needed   11. Referral Coordination: if needed 12. Care Plan, see assessment and plan , written personalized plan provided   13. Cognitive Assessment: motor skills and cognition appropriate for age 68. Care team updated, had eyes operated in [facility]  15. End-of-life care-- has a HC POA  In addition, today we discussed the following: Low back pain: Medication list includes tramadol and oxycodone, no clear to me which one he is taking. CAD: Good compliance with medications, no recent visit with cardiology Atrial fibrillation: Anticoagulated, his only complaint was occasional nose bleeds. High cholesterol, due for labs Diabetes: On no medicines, diet is regular   Review of Systems Constitutional: No fever. No chills. No unexplained wt changes. No unusual sweats  HEENT: No dental problems, no ear discharge, no facial swelling, no voice changes. No eye discharge, no eye  redness , no  intolerance to light   Respiratory: No wheezing , no  difficulty breathing. No cough , no mucus production  Cardiovascular: No CP, no leg swelling , no  Palpitations  GI: no nausea, no vomiting, no diarrhea , no  abdominal pain.  No blood in the stools. No dysphagia, no odynophagia    Endocrine: No polyphagia, no polyuria , no  polydipsia  GU: No dysuria, gross hematuria, difficulty urinating. No urinary urgency, no frequency.  Musculoskeletal: No joint swellings or unusual aches or pains  Skin: No change in the color of the skin, palor , no  Rash  Allergic, immunologic: No environmental allergies , no  food allergies  Neurological: No dizziness no  syncope. No headaches. No diplopia, no slurred, no slurred speech, no motor deficits, no facial  Numbness  Hematological: No enlarged lymph nodes, no easy bruising , no unusual bleedings  Psychiatry: No suicidal ideas, no hallucinations, no beavior problems, no confusion.  No unusual/severe anxiety, no depression   Past Medical History  Diagnosis Date  . GERD with stricture     w/hx of stricture  . PVD (peripheral vascular disease) ([place]) 12/07    per cath 12/07....iliac  . Carotid artery occlusion     last u/s 3-11...Marland Kitchenper vasc.surgery 2007 cath: nonobstructive CAD. mil;d MS, trivial AoS, small AAA,  . Bowen's disease     right arm BX: referred to dermatology  . Hyperlipidemia     takes Pravastatin daily  . AAA (abdominal aortic aneurysm) ([place]) 05/2008    PER CARDIAC CATH  . Aortic stenosis 05/2008    PER CARDIAC CATH, MILD, MITRAL STENOSIS  . Mitral stenosis 05/2008    PER CARDIAC CATH  . Coronary artery disease 05/2008    MILD, MEDICAL MANAGEMENT  . Pulmonary hypertension ([place])   . Dyspnea  w/u included a cath and PFT's, sxms thought to be pulmonary  . Chronic back pain   . Peripheral neuropathy (HCC)     right thigh; "it's been that way for years"  . Benign neoplasm of colon   . Stricture and stenosis of esophagus   . Myocardial infarction ([place]) 2012  . COPD (chronic obstructive pulmonary disease) ([place])   . Dizziness     takes Meclizine daily  . Back pain     cause unknown but does get Cortisone injection every now and then  . Blood transfusion without reported diagnosis   . Cataract   . Diabetes mellitus ([place]) 09/07/2011    no per pt- no  medications    Past Surgical History  Procedure Laterality Date  . Appendectomy    . Esophageal dilation    . Cardiac catheterization  02/2011    LAD 50, CFX OK, RCA40, EF 55%; PCWP 17, AoV  0.9 cm squared  . Endarterectomy  08-2010    Right carotid endarterectomy    . Aortic valve replacement  08/17/2011    Procedure: AORTIC VALVE REPLACEMENT (AVR);  Surgeon: Gaye Pollack, MD;  Location: [place];  Service: Open Heart Surgery;  Laterality: N/A;  . Colonoscopy    . Sternal wires removal N/A 05/01/2013    Procedure: STERNAL WIRES REMOVAL;  Surgeon: Gaye Pollack, MD;  Location: [place] OR;  Service: Thoracic;  Laterality: N/A;  . Left and right heart catheterization with coronary angiogram N/A 02/23/2011    Procedure: LEFT AND RIGHT HEART CATHETERIZATION WITH CORONARY ANGIOGRAM;  Surgeon: [name], MD;  Location: [facility] CATH LAB;  Service: Cardiovascular;  Laterality: N/A;  . Eye surgery  10/14/13    cataract surgery L eye   . Eye lid surgery Bilateral ~ 07-2015    Social History   Social History  . Marital Status: Widowed    Spouse Name: N/A  . Number of Children: 2  . Years of Education: N/A   Occupational History  . 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 Smoking status: Former Smoker -- 2.00 packs/day for 30 years    Types: Cigarettes  . Smokeless tobacco: Never Used     Comment: quit smoking in 1989  . Alcohol Use: No  . Drug Use: No  . Sexual Activity: Not on file   Other Topics Concern  . Not on file   Social History Narrative   LIVES BY HIMSELF    HAS 2 GROWN SONS, 5 G-KIDS (Melissa usually comes with him for his appointments)            Family History  Problem Relation Age of Onset  . Heart attack Father 21    MI  . Diabetes Neg Hx   . Colon cancer Neg Hx   . Anesthesia problems Neg Hx   . Hypotension Neg Hx   . Malignant hyperthermia Neg Hx   . Pseudochol deficiency  Neg Hx   . Stomach cancer Neg Hx   . Esophageal cancer Neg Hx   . Rectal cancer Neg Hx   . Prostate cancer Brother 73       Medication List       This list is accurate as of: 08/22/15 11:59 PM.  Always use your most recent med list.  acetaminophen 500 MG tablet  Commonly known as:  TYLENOL  Take 1,000 mg by mouth daily as needed (pain). Reported on 04/21/2015     ELIQUIS 5 MG Tabs tablet  Generic drug:  apixaban  TAKE 1 TABLET TWICE A DAY     furosemide 40 MG tablet  Commonly known as:  LASIX  TAKE 1 TABLET DAILY     KLOR-CON M20 20 MEQ tablet  Generic drug:  potassium chloride SA  TAKE 1 TABLET DAILY     meclizine 25 MG tablet  Commonly known as:  ANTIVERT  TAKE 1 TABLET 3 TIMES A DAY AS NEEDED FOR DIZZINESS     omeprazole 40 MG capsule  Commonly known as:  PRILOSEC  Take 40 mg by mouth 2 (two) times daily.     pravastatin 40 MG tablet  Commonly known as:  PRAVACHOL  Take 1 tablet (40 mg total) by mouth daily.     tiotropium 18 MCG inhalation capsule  Commonly known as:  SPIRIVA HANDIHALER  Place 1 capsule (18 mcg total) into inhaler and inhale daily.     traMADol 50 MG tablet  Commonly known as:  ULTRAM  Take 1 tablet (50 mg total) by mouth every 12 (twelve) hours as needed. for pain           Objective:   Physical Exam BP 126/76 mmHg  Pulse 90  Temp(Src) 97.8 F (36.6 C) (Oral)  Ht 5\' 7"  (1.702 m)  Wt 214 lb 4 oz (97.183 kg)  BMI 33.55 kg/m2  SpO2 96%  General:   Well developed, well nourished . NAD.  Neck: No  thyromegaly  HEENT:  Normocephalic . Face symmetric, atraumatic Lungs:  CTA B Normal respiratory effort, no intercostal retractions, no accessory muscle use. Heart: RRR,  no murmur.  No pretibial edema bilaterally  Abdomen:  Not distended, soft, non-tender. No rebound or rigidity.   DIABETIC FEET EXAM: No lower extremity edema Normal pedal pulses bilaterally Skin normal, nails normal, no calluses Pinprick  examination of the feet normal. Neurologic:  alert & oriented X3.  Speech normal, gait appropriate for age and unassisted Strength symmetric and appropriate for age.  Psych: Cognition and judgment appear intact.  Cooperative with normal attention span and concentration.  Behavior appropriate. No anxious or depressed appearing.    Assessment & Plan:   Assessment DM  w/ neuropathy (feet burning, nl exam) COPD Hyperlipidemia GERD with stricture CV: --CAD --Carotid artery disease-- R CEA 2012 --A. Fib-- paroxysmal - eliquis  --Aortic valve replacement (bio) --PVD: AAA, iliAortic valve, subclavian artery stenosis  MSK-- back pain, sees Dr Nelva Bush skin cancer  PLAN DM: Feet exam negative today, he does have symptoms of neuropathy.currently on diet control, check A1c, BMP. COPD:  asx Hyperlipidemia: On Pravachol, check FLP, AST, ALT CAD: Controlling CV RF. Not on beta blockers or ACEi;  BB relatively contraindicated due to COPD. A. fib: Anticoagulated, rate 90, at some point may benefit from BB or CCBs Back pain: On tramadol Rx by me  but also oxycodone. Recommend to use tramadol and discontinue oxycodone. TSH  slightly elevated: Check TFTs RTC 4 months

## 2015-08-22 NOTE — Progress Notes (Signed)
Pre visit review using our clinic review tool, if applicable. No additional management support is needed unless otherwise documented below in the visit note. 

## 2015-08-23 DIAGNOSIS — Z09 Encounter for follow-up examination after completed treatment for conditions other than malignant neoplasm: Secondary | ICD-10-CM | POA: Insufficient documentation

## 2015-08-23 NOTE — Assessment & Plan Note (Signed)
DM: Feet exam negative today, he does have symptoms of neuropathy.currently on diet control, check A1c, BMP. COPD:  asx Hyperlipidemia: On Pravachol, check FLP, AST, ALT CAD: Controlling CV RF. Not on beta blockers or ACEi;  BB relatively contraindicated due to COPD. A. fib: Anticoagulated, rate 90, at some point may benefit from BB or CCBs Back pain: On tramadol Rx by me  but also oxycodone. Recommend to use tramadol and discontinue oxycodone. TSH  slightly elevated: Check TFTs RTC 4 months

## 2015-08-25 ENCOUNTER — Other Ambulatory Visit (INDEPENDENT_AMBULATORY_CARE_PROVIDER_SITE_OTHER): Payer: Medicare Other

## 2015-08-25 DIAGNOSIS — E785 Hyperlipidemia, unspecified: Secondary | ICD-10-CM | POA: Diagnosis not present

## 2015-08-25 DIAGNOSIS — R946 Abnormal results of thyroid function studies: Secondary | ICD-10-CM

## 2015-08-25 DIAGNOSIS — R7989 Other specified abnormal findings of blood chemistry: Secondary | ICD-10-CM

## 2015-08-25 DIAGNOSIS — E118 Type 2 diabetes mellitus with unspecified complications: Secondary | ICD-10-CM

## 2015-08-25 LAB — LDL CHOLESTEROL, DIRECT: LDL DIRECT: 67 mg/dL

## 2015-08-25 LAB — BASIC METABOLIC PANEL
BUN: 11 mg/dL (ref 6–23)
CHLORIDE: 104 meq/L (ref 96–112)
CO2: 29 meq/L (ref 19–32)
Calcium: 9.6 mg/dL (ref 8.4–10.5)
Creatinine, Ser: 0.97 mg/dL (ref 0.40–1.50)
GFR: 79.12 mL/min (ref >60.00)
Glucose, Bld: 146 mg/dL — ABNORMAL HIGH (ref 70–99)
Potassium: 3.9 mEq/L (ref 3.5–5.1)
SODIUM: 140 meq/L (ref 135–145)

## 2015-08-25 LAB — LIPID PANEL
CHOLESTEROL: 126 mg/dL (ref 0–200)
HDL: 24 mg/dL — AB (ref >39.00)
NonHDL: 101.62
Total CHOL/HDL Ratio: 5
Triglycerides: 201 mg/dL — ABNORMAL HIGH (ref 0.0–149.0)
VLDL: 40.2 mg/dL — AB (ref 0.0–40.0)

## 2015-08-25 LAB — HEMOGLOBIN A1C: HEMOGLOBIN A1C: 6.9 % — AB (ref 4.6–6.5)

## 2015-08-25 LAB — TSH: TSH: 4.52 u[IU]/mL — AB (ref 0.35–4.50)

## 2015-08-25 LAB — T4, FREE: FREE T4: 0.73 ng/dL (ref 0.60–1.60)

## 2015-08-25 LAB — AST: AST: 24 U/L (ref 0–37)

## 2015-08-25 LAB — T3, FREE: T3, Free: 3.3 pg/mL (ref 2.3–4.2)

## 2015-08-25 LAB — ALT: ALT: 8 U/L (ref 0–53)

## 2015-09-09 ENCOUNTER — Encounter: Payer: Self-pay | Admitting: Cardiovascular Disease

## 2015-09-09 ENCOUNTER — Other Ambulatory Visit: Payer: Self-pay | Admitting: Internal Medicine

## 2015-09-09 ENCOUNTER — Ambulatory Visit (INDEPENDENT_AMBULATORY_CARE_PROVIDER_SITE_OTHER): Payer: Medicare Other | Admitting: Cardiovascular Disease

## 2015-09-09 VITALS — BP 150/82 | HR 94 | Ht 67.0 in | Wt 214.0 lb

## 2015-09-09 DIAGNOSIS — I2510 Atherosclerotic heart disease of native coronary artery without angina pectoris: Secondary | ICD-10-CM

## 2015-09-09 DIAGNOSIS — Z952 Presence of prosthetic heart valve: Secondary | ICD-10-CM

## 2015-09-09 DIAGNOSIS — Z954 Presence of other heart-valve replacement: Secondary | ICD-10-CM

## 2015-09-09 DIAGNOSIS — I480 Paroxysmal atrial fibrillation: Secondary | ICD-10-CM

## 2015-09-09 DIAGNOSIS — I6523 Occlusion and stenosis of bilateral carotid arteries: Secondary | ICD-10-CM | POA: Diagnosis not present

## 2015-09-09 NOTE — Progress Notes (Signed)
Cardiology Office Note Date:  09/11/2015   ID:  Fernando Moyer, DOB 01/16/1934, MRN [id number]  PCP:  [name], MD  Cardiologist:  [name], MD    Chief Complaint  Patient presents with  . Shortness of Breath     History of Present Illness: Fernando Moyer is a 82 y.o. male who presents for follow-up of aortic valve disease. The patient has undergone bioprosthetic aortic valve replacement in 2013. He's also followed for hypertension and paroxysmal atrial fibrillation and is maintained on chronic anticoagulation with eliquis. He has had chronic shortness of breath, but has been most limited by back and leg pain over the years. The patient had problems with chest pain after his cardiac surgery and he underwent removal of sternal wires.  The patient reports no change in symptoms. He has chronic exertional dyspnea. No orthopnea or PND. No chest pain. No edema or heart palpitations. Primarily limited by back and leg problems. He had a recent skin procedure done where he held Eliquis for 3 days and had no problems with that.  Past Medical History  Diagnosis Date  . GERD with stricture     w/hx of stricture  . PVD (peripheral vascular disease) ([place]) 12/07    per cath 12/07....iliac  . Carotid artery occlusion     last u/s 3-11...Marland Kitchenper vasc.surgery 2007 cath: nonobstructive CAD. mil;d MS, trivial AoS, small AAA,  . Bowen's disease     right arm BX: referred to dermatology  . Hyperlipidemia     takes Pravastatin daily  . AAA (abdominal aortic aneurysm) ([place]) 05/2008    PER CARDIAC CATH  . Aortic stenosis 05/2008    PER CARDIAC CATH, MILD, MITRAL STENOSIS  . Mitral stenosis 05/2008    PER CARDIAC CATH  . Coronary artery disease 05/2008    MILD, MEDICAL MANAGEMENT  . Pulmonary hypertension ([place])   . Dyspnea      w/u included a cath and PFT's, sxms thought to be pulmonary  . Chronic back pain   . Peripheral neuropathy (HCC)     right thigh; "it's been that way for years"  .  Benign neoplasm of colon   . Stricture and stenosis of esophagus   . Myocardial infarction ([place]) 2012  . COPD (chronic obstructive pulmonary disease) ([place])   . Dizziness     takes Meclizine daily  . Back pain     cause unknown but does get Cortisone injection every now and then  . Blood transfusion without reported diagnosis   . Cataract   . Diabetes mellitus ([place]) 09/07/2011    no per pt- no medications    Past Surgical History  Procedure Laterality Date  . Appendectomy    . Esophageal dilation    . Cardiac catheterization  02/2011    LAD 50, CFX OK, RCA40, EF 55%; PCWP 17, AoV  0.9 cm squared  . Endarterectomy  08-2010    Right carotid endarterectomy    . Aortic valve replacement  08/17/2011    Procedure: AORTIC VALVE REPLACEMENT (AVR);  Surgeon: Gaye Pollack, MD;  Location: [place];  Service: Open Heart Surgery;  Laterality: N/A;  . Colonoscopy    . Sternal wires removal N/A 05/01/2013    Procedure: STERNAL WIRES REMOVAL;  Surgeon: Gaye Pollack, MD;  Location: [facility] OR;  Service: Thoracic;  Laterality: N/A;  . Left and right heart catheterization with coronary angiogram N/A 02/23/2011    Procedure: LEFT AND RIGHT HEART CATHETERIZATION WITH CORONARY ANGIOGRAM;  Surgeon: Tonny Bollman, MD;  Location: [facility] CATH LAB;  Service: Cardiovascular;  Laterality: N/A;  . Eye surgery  10/14/13    cataract surgery L eye   . Eye lid surgery Bilateral ~ 07-2015    Current Outpatient Prescriptions  Medication Sig Dispense Refill  . acetaminophen (TYLENOL) 500 MG tablet Take 1,000 mg by mouth daily as needed (pain). Reported on 04/21/2015    . ELIQUIS 5 MG TABS tablet TAKE 1 TABLET TWICE A DAY 180 tablet 1  . furosemide (LASIX) 40 MG tablet TAKE 1 TABLET DAILY 90 tablet 1  . KLOR-CON M20 20 MEQ tablet TAKE 1 TABLET DAILY 90 tablet 1  . meclizine (ANTIVERT) 25 MG tablet TAKE 1 TABLET 3 TIMES A DAY AS NEEDED FOR DIZZINESS 30 tablet 1  . omeprazole (PRILOSEC) 40 MG capsule Take 40 mg by mouth 2 (two)  times daily.    . pravastatin (PRAVACHOL) 40 MG tablet Take 1 tablet (40 mg total) by mouth daily. 90 tablet 2  . tiotropium (SPIRIVA HANDIHALER) 18 MCG inhalation capsule Place 1 capsule (18 mcg total) into inhaler and inhale daily. 90 capsule 2  . traMADol (ULTRAM) 50 MG tablet Take 1 tablet (50 mg total) by mouth every 12 (twelve) hours as needed. for pain 60 tablet 0   No current facility-administered medications for this visit.    Allergies:   Review of patient's allergies indicates no known allergies.   Social History:  The patient  reports that he has quit smoking. His smoking use included Cigarettes. He has a 60 pack-year smoking history. He has never used smokeless tobacco. He reports that he does not drink alcohol or use illicit drugs.   Family History:  The patient's  family history includes Heart attack (age of onset: 87) in his father; Prostate cancer (age of onset: 31) in his brother. There is no history of Diabetes, Colon cancer, Anesthesia problems, Hypotension, Malignant hyperthermia, Pseudochol deficiency, Stomach cancer, Esophageal cancer, or Rectal cancer.   ROS:  Please see the history of present illness.  Otherwise, review of systems is positive for hearing loss, back pain, excessive fatigue, leg pain.  All other systems are reviewed and negative.   PHYSICAL EXAM: VS:  BP 150/82 mmHg  Pulse 94  Ht 5\' 7"  (1.702 m)  Wt 214 lb (97.07 kg)  BMI 33.51 kg/m2 , BMI Body mass index is 33.51 kg/(m^2).  Blood pressure on my recheck is 140/60 GEN: Well nourished, well developed, pleasant elderly male in no acute distress HEENT: normal Neck: no JVD, no masses. Bilateral carotid bruits Cardiac: RRR with 2/6 SEM at the RUSB, no diastolic murmur              Respiratory:  clear to auscultation bilaterally, normal work of breathing GI: soft, nontender, nondistended, + BS, obese MS: no deformity or atrophy Ext: no pretibial edema Skin: warm and dry, no rash Neuro:  Strength and  sensation are intact Psych: euthymic mood, full affect  EKG:  EKG is ordered today. The ekg ordered today shows NSR 96 bpm, right bundle branch block, left anterior fascicular block, PACs, no change from previous tracings.  Recent Labs: 10/19/2014: Hemoglobin 14.7; Platelets 256.0 08/25/2015: ALT 8; BUN 11; Creatinine, Ser 0.97; Potassium 3.9; Sodium 140; TSH 4.52*   Lipid Panel     Component Value Date/Time   CHOL 126 08/25/2015 0757   TRIG 201.0* 08/25/2015 0757   TRIG 96 03/20/2006 0945   HDL 24.00* 08/25/2015 0757   CHOLHDL 5 08/25/2015  0757   CHOLHDL 3.9 CALC 03/20/2006 0945   VLDL 40.2* 08/25/2015 0757   LDLCALC 78 06/17/2014 0933   LDLDIRECT 67.0 08/25/2015 0757   LDLDIRECT 120.3 02/01/2006 1001      Wt Readings from Last 3 Encounters:  09/09/15 214 lb (97.07 kg)  08/22/15 214 lb 4 oz (97.183 kg)  06/17/15 219 lb (99.338 kg)     Cardiac Studies Reviewed: 2D Echo 12/04/2013: Study Conclusions  - Left ventricle: The cavity size was normal. There was moderate concentric hypertrophy. Systolic function was vigorous. The estimated ejection fraction was in the range of 65% to 70%. Wall motion was normal; there were no regional wall motion abnormalities. Doppler parameters are consistent with high ventricular filling pressure. - Aortic valve: A bioprosthesis was present. There was trivial regurgitation. Peak velocity (S): 291 cm/s. Within normal range (AVR). - Mitral valve: Moderately calcified annulus. - Left atrium: The atrium was mildly dilated.  ASSESSMENT AND PLAN: 1.  CAD, native vessel: The patient has been managed medically. No angina. His coronary disease has been nonobstructive by cardiac catheterization in the past. Current medications are reviewed and will be continued without change.  2. Aortic valve disease status post bioprosthetic aortic valve replacement: NYHA II sx's. Last echo 2015 reviewed. Exam unchanged.  3. Paroxysmal atrial  fibrillation: The patient is anticoagulated with Eliquis. CHADS-Vasc score is 4 for age, vascular disease, and diabetes. Maintaining sinus rhythm with PAC's on his EKG.  4. Carotid stenosis and no history of stroke or TIA: Previous carotid endarterectomy. Followed by vascular surgery. On chronic anticoagulation. Most recent duplex reviewed with patency of his right CEA site.   5. Lower extremity peripheral artery disease: Patient with known iliac occlusion, managed medically without claudication.  6. Chronic dyspnea, multifactorial: A lengthy discussion about his back pain and sedentary lifestyle. He understands that he will need to focus on weight loss and exercise as much as possible. He is able to ride a stationary bike. No signs of volume overload on his exam.  Current medicines are reviewed with the patient today.  The patient does not have concerns regarding medicines.  Labs/ tests ordered today include:   Orders Placed This Encounter  Procedures  . EKG 12-Lead    Disposition:   FU 6 months  Signed, [name], MD  09/11/2015 9:44 PM    [facility] [address], [place], [place]  [address] Phone: [phone]; Fax: [phone]

## 2015-09-09 NOTE — Patient Instructions (Signed)

## 2015-10-28 ENCOUNTER — Other Ambulatory Visit: Payer: Self-pay | Admitting: Internal Medicine

## 2015-10-29 ENCOUNTER — Emergency Department (HOSPITAL_COMMUNITY): Payer: Medicare Other

## 2015-10-29 ENCOUNTER — Encounter (HOSPITAL_COMMUNITY): Payer: Self-pay | Admitting: Emergency Medicine

## 2015-10-29 ENCOUNTER — Encounter (HOSPITAL_COMMUNITY)
Admission: EM | Disposition: A | Discharge status: Home / Self Care | Payer: Self-pay | Source: Home / Self Care | Attending: Emergency Medicine

## 2015-10-29 ENCOUNTER — Ambulatory Visit (HOSPITAL_COMMUNITY)
Admission: EM | Admit: 2015-10-29 | Discharge: 2015-10-29 | Disposition: A | Discharge status: Home / Self Care | Payer: Medicare Other | Attending: Emergency Medicine | Admitting: Emergency Medicine

## 2015-10-29 DIAGNOSIS — I252 Old myocardial infarction: Secondary | ICD-10-CM | POA: Diagnosis not present

## 2015-10-29 DIAGNOSIS — G8929 Other chronic pain: Secondary | ICD-10-CM | POA: Insufficient documentation

## 2015-10-29 DIAGNOSIS — Z87891 Personal history of nicotine dependence: Secondary | ICD-10-CM | POA: Diagnosis not present

## 2015-10-29 DIAGNOSIS — Z8719 Personal history of other diseases of the digestive system: Secondary | ICD-10-CM | POA: Diagnosis not present

## 2015-10-29 DIAGNOSIS — I10 Essential (primary) hypertension: Secondary | ICD-10-CM | POA: Diagnosis not present

## 2015-10-29 DIAGNOSIS — K222 Esophageal obstruction: Secondary | ICD-10-CM | POA: Insufficient documentation

## 2015-10-29 DIAGNOSIS — E119 Type 2 diabetes mellitus without complications: Secondary | ICD-10-CM | POA: Diagnosis not present

## 2015-10-29 DIAGNOSIS — Z8679 Personal history of other diseases of the circulatory system: Secondary | ICD-10-CM | POA: Insufficient documentation

## 2015-10-29 DIAGNOSIS — J449 Chronic obstructive pulmonary disease, unspecified: Secondary | ICD-10-CM | POA: Insufficient documentation

## 2015-10-29 DIAGNOSIS — T18128A Food in esophagus causing other injury, initial encounter: Secondary | ICD-10-CM

## 2015-10-29 DIAGNOSIS — Z952 Presence of prosthetic heart valve: Secondary | ICD-10-CM | POA: Diagnosis not present

## 2015-10-29 DIAGNOSIS — I2510 Atherosclerotic heart disease of native coronary artery without angina pectoris: Secondary | ICD-10-CM | POA: Diagnosis not present

## 2015-10-29 DIAGNOSIS — K449 Diaphragmatic hernia without obstruction or gangrene: Secondary | ICD-10-CM | POA: Diagnosis not present

## 2015-10-29 DIAGNOSIS — E785 Hyperlipidemia, unspecified: Secondary | ICD-10-CM | POA: Diagnosis not present

## 2015-10-29 DIAGNOSIS — R1310 Dysphagia, unspecified: Secondary | ICD-10-CM | POA: Diagnosis not present

## 2015-10-29 DIAGNOSIS — J9811 Atelectasis: Secondary | ICD-10-CM | POA: Diagnosis not present

## 2015-10-29 DIAGNOSIS — Z7901 Long term (current) use of anticoagulants: Secondary | ICD-10-CM | POA: Diagnosis not present

## 2015-10-29 DIAGNOSIS — Q399 Congenital malformation of esophagus, unspecified: Secondary | ICD-10-CM | POA: Insufficient documentation

## 2015-10-29 DIAGNOSIS — I480 Paroxysmal atrial fibrillation: Secondary | ICD-10-CM | POA: Insufficient documentation

## 2015-10-29 DIAGNOSIS — I080 Rheumatic disorders of both mitral and aortic valves: Secondary | ICD-10-CM | POA: Diagnosis not present

## 2015-10-29 DIAGNOSIS — K219 Gastro-esophageal reflux disease without esophagitis: Secondary | ICD-10-CM | POA: Insufficient documentation

## 2015-10-29 HISTORY — PX: ESOPHAGOGASTRODUODENOSCOPY: SHX5428

## 2015-10-29 LAB — CBC WITH DIFFERENTIAL/PLATELET
Basophils Absolute: 0 10*3/uL (ref 0.0–0.1)
Basophils Relative: 0 %
Eosinophils Absolute: 0.2 10*3/uL (ref 0.0–0.7)
Eosinophils Relative: 2 %
HEMATOCRIT: 41.2 % (ref 39.0–52.0)
HEMOGLOBIN: 13.7 g/dL (ref 13.0–17.0)
LYMPHS ABS: 1.6 10*3/uL (ref 0.7–4.0)
Lymphocytes Relative: 22 %
MCH: 29.1 pg (ref 26.0–34.0)
MCHC: 33.3 g/dL (ref 30.0–36.0)
MCV: 87.7 fL (ref 78.0–100.0)
MONO ABS: 0.6 10*3/uL (ref 0.1–1.0)
MONOS PCT: 8 %
NEUTROS ABS: 4.9 10*3/uL (ref 1.7–7.7)
NEUTROS PCT: 68 %
Platelets: 213 10*3/uL (ref 150–400)
RBC: 4.7 MIL/uL (ref 4.22–5.81)
RDW: 14.8 % (ref 11.5–15.5)
WBC: 7.3 10*3/uL (ref 4.0–10.5)

## 2015-10-29 LAB — BASIC METABOLIC PANEL
Anion gap: 6 (ref 5–15)
BUN: 11 mg/dL (ref 6–20)
CHLORIDE: 109 mmol/L (ref 101–111)
CO2: 25 mmol/L (ref 22–32)
CREATININE: 0.99 mg/dL (ref 0.61–1.24)
Calcium: 9.6 mg/dL (ref 8.9–10.3)
GFR calc Af Amer: >60 mL/min (ref >60)
GFR calc non Af Amer: >60 mL/min (ref >60)
GLUCOSE: 129 mg/dL — AB (ref 65–99)
Potassium: 4 mmol/L (ref 3.5–5.1)
Sodium: 140 mmol/L (ref 135–145)

## 2015-10-29 SURGERY — EGD (ESOPHAGOGASTRODUODENOSCOPY)
Anesthesia: Moderate Sedation

## 2015-10-29 MED ORDER — SODIUM CHLORIDE 0.9 % IV BOLUS (SEPSIS)
500.0000 mL | Freq: Once | INTRAVENOUS | Status: AC
  Administered 2015-10-29: 500 mL via INTRAVENOUS

## 2015-10-29 MED ORDER — SODIUM CHLORIDE 0.9 % IV SOLN: INTRAVENOUS | Status: DC

## 2015-10-29 MED ORDER — MIDAZOLAM HCL 5 MG/ML IJ SOLN
INTRAMUSCULAR | Status: AC
  Filled 2015-10-29: qty 2

## 2015-10-29 MED ORDER — MIDAZOLAM HCL 10 MG/2ML IJ SOLN
INTRAMUSCULAR | Status: DC | PRN
  Administered 2015-10-29 (×3): 2 mg via INTRAVENOUS

## 2015-10-29 MED ORDER — FENTANYL CITRATE (PF) 100 MCG/2ML IJ SOLN
INTRAMUSCULAR | Status: DC | PRN
  Administered 2015-10-29 (×3): 25 ug via INTRAVENOUS

## 2015-10-29 MED ORDER — BUTAMBEN-TETRACAINE-BENZOCAINE 2-2-14 % EX AERO
INHALATION_SPRAY | CUTANEOUS | Status: DC | PRN
  Administered 2015-10-29: 2 via TOPICAL

## 2015-10-29 MED ORDER — GLUCAGON HCL RDNA (DIAGNOSTIC) 1 MG IJ SOLR
1.0000 mg | Freq: Once | INTRAMUSCULAR | Status: AC
  Administered 2015-10-29: 1 mg via INTRAVENOUS
  Filled 2015-10-29: qty 1

## 2015-10-29 MED ORDER — FENTANYL CITRATE (PF) 100 MCG/2ML IJ SOLN
INTRAMUSCULAR | Status: AC
  Filled 2015-10-29: qty 2

## 2015-10-29 NOTE — ED Notes (Signed)
Pt taken to xray 

## 2015-10-29 NOTE — Consult Note (Signed)
Referring Provider: Quincy Carnes, Hershal Coria, ED Primary Care Physician:  [name], MD Primary Gastroenterologist:  Dr. Ardis Hughs  Reason for Consultation:  Food impaction  HPI: Fernando Moyer is a 82 y.o. male with history of AAA, aortic stenosis status post valve replacement, hypertension, diabetes, COPD, chronic back pain, GERD, hyperlipidemia, paroxysmal A. fib on Eliquis, presenting to the ED with food impaction. Patient has a history of esophageal strictures requiring dilatation in the past.  Last had EGD in 03/2015. States yesterday he was trying to eat a steak sandwich from [employer] but continued having the sensation that there was something stuck in the bottom of his throat. He reported that he has not been able to eat or drink anything since this time.  He has been having any difficulty handling his secretions.  No fever, chills, sweats.  Denies any abdominal pain currently.  VSS.  Last took Eliquis yesterday morning as he has not been able to swallow since dinner yesterday.  EGD with Dr. Ardis Hughs 03/2015--  Endoscopic Impression: There was a large hiatal hernia (5-6cm). There was a thick Schatzki's ring vs. focal peptic stricture with lumen 56mm, this was dilated with a TTS balloon held inflated to 27mm for 1 minute. There was typical superficial mucosal tear and self limited oozing of blood following dilation. The examination was otherwise normal. RECOMMENDATIONS: Continue PPI twice daily. Continue to chew your food well, eat slowly and take small bites. Please call if the swallowing difficulty returns.   Past Medical History:  Diagnosis Date  . AAA (abdominal aortic aneurysm) ([place]) 05/2008   PER CARDIAC CATH  . Aortic stenosis 05/2008   PER CARDIAC CATH, MILD, MITRAL STENOSIS  . Back pain    cause unknown but does get Cortisone injection every now and then  . Benign neoplasm of colon   . Blood transfusion without reported diagnosis   . Bowen's disease    right arm BX: referred to  dermatology  . Carotid artery occlusion    last u/s 3-11...Marland Kitchenper vasc.surgery 2007 cath: nonobstructive CAD. mil;d MS, trivial AoS, small AAA,  . Cataract   . Chronic back pain   . COPD (chronic obstructive pulmonary disease) ([place])   . Coronary artery disease 05/2008   MILD, MEDICAL MANAGEMENT  . Diabetes mellitus ([place]) 09/07/2011   no per pt- no medications  . Dizziness    takes Meclizine daily  . Dyspnea     w/u included a cath and PFT's, sxms thought to be pulmonary  . GERD with stricture    w/hx of stricture  . Hyperlipidemia    takes Pravastatin daily  . Mitral stenosis 05/2008   PER CARDIAC CATH  . Myocardial infarction ([place]) 2012  . Peripheral neuropathy ([facility])    right thigh; "it's been that way for years"  . Pulmonary hypertension ([place])   . PVD (peripheral vascular disease) ([place]) 12/07   per cath 12/07....iliac  . Stricture and stenosis of esophagus     Past Surgical History:  Procedure Laterality Date  . AORTIC VALVE REPLACEMENT  08/17/2011   Procedure: AORTIC VALVE REPLACEMENT (AVR);  Surgeon: Gaye Pollack, MD;  Location: [facility];  Service: Open Heart Surgery;  Laterality: N/A;  . APPENDECTOMY    . CARDIAC CATHETERIZATION  02/2011   LAD 50, CFX OK, RCA40, EF 55%; PCWP 17, AoV  0.9 cm squared  . COLONOSCOPY    . ENDARTERECTOMY  08-2010   Right carotid endarterectomy    . ESOPHAGEAL DILATION    .  eye lid surgery Bilateral ~ 07-2015  . EYE SURGERY  10/14/13   cataract surgery L eye   . LEFT AND RIGHT HEART CATHETERIZATION WITH CORONARY ANGIOGRAM N/A 02/23/2011   Procedure: LEFT AND RIGHT HEART CATHETERIZATION WITH CORONARY ANGIOGRAM;  Surgeon: [name], MD;  Location[facility] C[facility] LAB;  Service: Cardiovascular;  Laterality: N/A;  . STERNAL WIRES REMOVAL N/A 05/01/2013   Procedure: STERNAL WIRES REMOVAL;  Surgeon: Gaye Pollack, MD;  Location: [facility] OR;  Service: Thoracic;  Laterality: N/A;    Prior to Admission medications   Medication Sig Start Date End Date Taking?  Authorizing Provider  acetaminophen (TYLENOL) 500 MG tablet Take 1,000 mg by mouth daily as needed (pain). Reported on 04/21/2015    Historical Provider, MD  ELIQUIS 5 MG TABS tablet TAKE 1 TABLET TWICE A DAY 08/19/15   [name], MD  furosemide (LASIX) 40 MG tablet TAKE 1 TABLET DAILY 08/19/15   [name], MD  KLOR-CON M20 20 MEQ tablet TAKE 1 TABLET DAILY 08/19/15   [name], MD  meclizine (ANTIVERT) 25 MG tablet TAKE 1 TABLET 3 TIMES A DAY AS NEEDED FOR DIZZINESS    Colon Branch, MD  omeprazole (PRILOSEC) 40 MG capsule Take 40 mg by mouth 2 (two) times daily.    Historical Provider, MD  pravastatin (PRAVACHOL) 40 MG tablet Take 1 tablet (40 mg total) by mouth daily. 09/09/15   Colon Branch, MD  tiotropium (SPIRIVA HANDIHALER) 18 MCG inhalation capsule Place 1 capsule (18 mcg total) into inhaler and inhale daily. 05/23/15   Colon Branch, MD  traMADol (ULTRAM) 50 MG tablet Take 1 tablet (50 mg total) by mouth every 12 (twelve) hours as needed. for pain 07/19/15   Colon Branch, MD    No current facility-administered medications for this encounter.    Current Outpatient Prescriptions  Medication Sig Dispense Refill  . acetaminophen (TYLENOL) 500 MG tablet Take 1,000 mg by mouth daily as needed (pain). Reported on 04/21/2015    . ELIQUIS 5 MG TABS tablet TAKE 1 TABLET TWICE A DAY 180 tablet 1  . furosemide (LASIX) 40 MG tablet TAKE 1 TABLET DAILY 90 tablet 1  . KLOR-CON M20 20 MEQ tablet TAKE 1 TABLET DAILY 90 tablet 1  . meclizine (ANTIVERT) 25 MG tablet TAKE 1 TABLET 3 TIMES A DAY AS NEEDED FOR DIZZINESS 30 tablet 1  . omeprazole (PRILOSEC) 40 MG capsule Take 40 mg by mouth 2 (two) times daily.    . pravastatin (PRAVACHOL) 40 MG tablet Take 1 tablet (40 mg total) by mouth daily. 90 tablet 2  . tiotropium (SPIRIVA HANDIHALER) 18 MCG inhalation capsule Place 1 capsule (18 mcg total) into inhaler and inhale daily. 90 capsule 2  . traMADol (ULTRAM) 50 MG tablet Take 1 tablet (50 mg total) by mouth  every 12 (twelve) hours as needed. for pain 60 tablet 0    Allergies as of 10/29/2015  . (No Known Allergies)    Family History  Problem Relation Age of Onset  . Heart attack Father 45    MI  . Prostate cancer Brother 55  . Diabetes Neg Hx   . Colon cancer Neg Hx   . Anesthesia problems Neg Hx   . Hypotension Neg Hx   . Malignant hyperthermia Neg Hx   . Pseudochol deficiency Neg Hx   . Stomach cancer Neg Hx   . Esophageal cancer Neg Hx   . Rectal cancer Neg Hx     Social  History   Social History  . Marital status: Widowed    Spouse name: N/A  . Number of children: 2  . Years of education: N/A   Occupational History  . 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 Smoking status: Former Smoker    Packs/day: 2.00    Years: 30.00    Types: Cigarettes  . Smokeless tobacco: Never Used     Comment: quit smoking in 1989  . Alcohol use No  . Drug use: No  . Sexual activity: Not on file   Other Topics Concern  . Not on file   Social History Narrative   LIVES BY HIMSELF    HAS 2 GROWN SONS, 5 G-KIDS (Melissa usually comes with him for his appointments)           Review of Systems: Ten point ROS is O/W negative except as mentioned in HPI.  Physical Exam: Vital signs in last 24 hours: Pulse Rate:  [100-104] 100 (07/29 1100) BP: (154-186)/(65-88) 186/73 (07/29 1100) SpO2:  [92 %-93 %] 92 % (07/29 1100)   Gen: awake, alert, NAD HEENT: anicteric, op clear CV: irreg, mild tachycardia Pulm: CTA b/l Abd: soft, NT/ND, +BS throughout Ext: no c/c/e Neuro: nonfocal   Lab Results:  Recent Labs  10/29/15 0853  WBC 7.3  HGB 13.7  HCT 41.2  PLT 213   BMET  Recent Labs  10/29/15 0853  NA 140  K 4.0  CL 109  CO2 25  GLUCOSE 129*  BUN 11  CREATININE 0.99  CALCIUM 9.6   Studies/Results: Dg Chest 2 View  Result Date: 10/29/2015 CLINICAL DATA:  Food impaction EXAM:  CHEST  2 VIEW COMPARISON:  05/13/2013 FINDINGS: Mild cardiomegaly. Mild peribronchial thickening and interstitial prominence. Bibasilar opacities could reflect atelectasis or scarring. No effusions or acute bony abnormality. IMPRESSION: Suspect mild bronchitic changes.  Bibasilar atelectasis or scarring. Electronically Signed   By: Rolm Baptise M.D.   On: 10/29/2015 09:29  IMPRESSION:  -82 year old male with history of esophageal stricture and food impaction.  Suspected to have a food impaction currently after eating a steak sandwich at [employer] yesterday.  PLAN: -EGD today in ED with removal of suspected food bolus.  Will not be able to dilate given recent impaction and the use of Eliquis.  Will arrange followup with Dr. Ardis Hughs for repeat dilation once Eliquis can be held appropriately with permission from Dr. Burt Knack (cardiology). -The nature of the procedure, as well as the risks, benefits, and alternatives were carefully and thoroughly reviewed with the patient. Ample time for discussion and questions allowed. The patient understood, was satisfied, and agreed to proceed.      ZEHR, JESSICA D.  10/29/2015, 11:20 AM  Pager number [phone]  Lajuan Lines. Paytin Ramakrishnan, M.D.  10/29/2015

## 2015-10-29 NOTE — Op Note (Signed)
[facility] Patient Name: Fernando Moyer Procedure Date : 10/29/2015 MRN: [id number] Attending MD: Jerene Bears , MD Date of Birth: 02-Apr-1934 CSN: [id number] Age: 82 Admit Type: Outpatient Procedure:                Upper GI endoscopy Indications:              Foreign body in the esophagus Providers:                Lajuan Lines. Hilarie Fredrickson, MD, Carolynn Comment, RN, Alfonso Patten, Technician Referring MD:             [facility] Medicines:                Fentanyl 75 micrograms IV, Midazolam 7 mg IV Complications:            No immediate complications. Estimated Blood Loss:     Estimated blood loss: none. Procedure:                Pre-Anesthesia Assessment:                           - Prior to the procedure, a History and Physical                            was performed, and patient medications and                            allergies were reviewed. The patient's tolerance of                            previous anesthesia was also reviewed. The risks                            and benefits of the procedure and the sedation                            options and risks were discussed with the patient.                            All questions were answered, and informed consent                            was obtained. Prior Anticoagulants: The patient has                            taken Eliquis (apixaban), last dose was 1 day prior                            to procedure. ASA Grade Assessment: III - A patient                            with severe systemic disease. After reviewing the  risks and benefits, the patient was deemed in                            satisfactory condition to undergo the procedure.                           After obtaining informed consent, the endoscope was                            passed under direct vision. Throughout the                            procedure, the patient's blood  pressure, pulse, and                            oxygen saturations were monitored continuously. The                            [id number]) scope was introduced through the                            mouth, and advanced to the second part of duodenum.                            The upper GI endoscopy was accomplished without                            difficulty. The patient tolerated the procedure                            well. Scope In: Scope Out: Findings:      The mid esophagus and distal esophagus were tortuous.      One moderate benign-appearing, intrinsic stenosis was found 35 cm from       the incisors. This measured less than one cm (in length) and was       traversed. There was evidence of mild pressure esophagitis just above       the narrowing, but no evidence of retained food bolus/impaction.      A 5 cm hiatal hernia was present.      The entire examined stomach was normal.      The examined duodenum was normal. Impression:               - Tortuous esophagus.                           - Benign-appearing esophageal stenosis without                            evidence of food impaction or esophageal                            obstruction.                           - 5 cm hiatal hernia.                           -  Normal stomach.                           - Normal examined duodenum. Moderate Sedation:      Moderate (conscious) sedation was administered by the endoscopy nurse       and supervised by the endoscopist. The following parameters were       monitored: oxygen saturation, heart rate, blood pressure, and response       to care. Total physician intraservice time was 13 minutes. Recommendation:           - Observe patient in emergency room for possible                            discharge same day.                           - Soft diet.                           - Continue present medications.                           - Resume Eliquis (apixaban) at prior  dose today.                           - Repeat upper endoscopy at appointment to be                            scheduled with Dr. Ardis Hughs for repeat dilation.                           - The findings and recommendations were discussed                            with the patient's family. Procedure Code(s):        --- Professional ---                           [phone], Esophagogastroduodenoscopy, flexible,                            transoral; diagnostic, including collection of                            specimen(s) by brushing or washing, when performed                            (separate procedure)                           G0500, Moderate sedation services provided by the                            same physician or other qualified health care                            professional performing  a gastrointestinal                            endoscopic service that sedation supports,                            requiring the presence of an independent trained                            observer to assist in the monitoring of the                            patient's level of consciousness and physiological                            status; initial 15 minutes of intra-service time;                            patient age 87 years or older (additional time may                            be reported with [phone], as appropriate) Diagnosis Code(s):        --- Professional ---                           Q39.9, Congenital malformation of esophagus,                            unspecified                           K22.2, Esophageal obstruction                           K44.9, Diaphragmatic hernia without obstruction or                            gangrene                           T18.108A, Unspecified foreign body in esophagus                            causing other injury, initial encounter CPT copyright 2016 [employer]. All rights reserved. The codes documented in this report are  preliminary and upon coder review may  be revised to meet current compliance requirements. Jerene Bears, MD 10/29/2015 1:39:01 PM This report has been signed electronically. Number of Addenda: 0

## 2015-10-29 NOTE — ED Provider Notes (Signed)
[facility] DEPT Provider Note   CSN: [id number] Arrival date & time: 10/29/15  [id number]  First Provider Contact:  First MD Initiated Contact with Patient 10/29/15 0825        History   Chief Complaint No chief complaint on file.   HPI Fernando Moyer is a 82 y.o. male.  The history is provided by the patient and a significant other.   82 year old male with history of AAA, aortic stenosis status post valve replacement, hypertension, diabetes, COPD, chronic back pain, GERD, hyperlipidemia, paroxysmal A. fib on Eliquis, presenting to the ED with food impaction. Patient reports he has a history of esophageal strictures requiring dilatation in the past. He states he last had this done about 1 year ago. States yesterday he was trying to eat a steak sandwich from [employer] but continued having the sensation that there was something stuck in the bottom of his throat. He states he has not been able to eat or drink anything since this time.  He is not having any difficulty handling his secretions.  No fever, chills, sweats.  No diarrhea.  Denies any abdominal pain currently.  Patient followed by GI, Dr. Ardis Hughs.  VSS.  Past Medical History:  Diagnosis Date  . AAA (abdominal aortic aneurysm) ([place]) 05/2008   PER CARDIAC CATH  . Aortic stenosis 05/2008   PER CARDIAC CATH, MILD, MITRAL STENOSIS  . Back pain    cause unknown but does get Cortisone injection every now and then  . Benign neoplasm of colon   . Blood transfusion without reported diagnosis   . Bowen's disease    right arm BX: referred to dermatology  . Carotid artery occlusion    last u/s 3-11...Marland Kitchenper vasc.surgery 2007 cath: nonobstructive CAD. mil;d MS, trivial AoS, small AAA,  . Cataract   . Chronic back pain   . COPD (chronic obstructive pulmonary disease) ([place])   . Coronary artery disease 05/2008   MILD, MEDICAL MANAGEMENT  . Diabetes mellitus ([place]) 09/07/2011   no per pt- no medications  . Dizziness    takes Meclizine daily  .  Dyspnea     w/u included a cath and PFT's, sxms thought to be pulmonary  . GERD with stricture    w/hx of stricture  . Hyperlipidemia    takes Pravastatin daily  . Mitral stenosis 05/2008   PER CARDIAC CATH  . Myocardial infarction ([place]) 2012  . Peripheral neuropathy ([facility])    right thigh; "it's been that way for years"  . Pulmonary hypertension ([place])   . PVD (peripheral vascular disease) ([place]) 12/07   per cath 12/07....iliac  . Stricture and stenosis of esophagus     Patient Active Problem List   Diagnosis Date Noted  . PCP NOTES >>>>>>>>>>>>>>>>>> 08/23/2015  . Back pain 10/19/2014  . Carotid stenosis 04/14/2014  . Toe infection 03/15/2014  . Dyspnea 02/01/2014  . Skin cancer 09/29/2013  . Subclavian artery stenosis ([place]) 04/08/2013  . Annual physical exam 02/25/2013  . Other dysphagia 02/12/2013  . History of esophageal stricture 02/12/2013  . Chest pain 12/26/2012  . Atypical chest pain 11/21/2012  . S/P AVR (aortic valve replacement) 11/21/2012  . PAF (paroxysmal atrial fibrillation) ([place]) 11/21/2012  . Dizziness 09/28/2011  . Diabetes mellitus ([place]) 09/07/2011  . Atrial flutter with rapid ventricular response ([place]) 02/22/2011  . BPH , Microscopic hematuria 01/17/2011  . Rosacea 01/17/2011  . CAROTID ARTERY DISEASE 06/24/2009  . DIZZINESS 04/04/2009  . Headache(784.0) 04/04/2009  . EMPHYSEMA 07/12/2008  . Mitral  stenosis 06/29/2008  . CAD (coronary artery disease) 06/29/2008  . HYPERTENSION, PULMONARY 06/29/2008  . AORTIC STENOSIS, moderately severe 06/29/2008  . ABDOMINAL AORTIC ANEURYSM 06/29/2008  . Hyperlipidemia 04/14/2008  . Peripheral vascular disease ([place]) 04/14/2006  . GERD, Stricture per EGD 11 2014 04/14/2006    Past Surgical History:  Procedure Laterality Date  . AORTIC VALVE REPLACEMENT  08/17/2011   Procedure: AORTIC VALVE REPLACEMENT (AVR);  Surgeon: Gaye Pollack, MD;  Location: [facility];  Service: Open Heart Surgery;  Laterality: N/A;  .  APPENDECTOMY    . CARDIAC CATHETERIZATION  02/2011   LAD 50, CFX OK, RCA40, EF 55%; PCWP 17, AoV  0.9 cm squared  . COLONOSCOPY    . ENDARTERECTOMY  08-2010   Right carotid endarterectomy    . ESOPHAGEAL DILATION    . eye lid surgery Bilateral ~ 07-2015  . EYE SURGERY  10/14/13   cataract surgery L eye   . LEFT AND RIGHT HEART CATHETERIZATION WITH CORONARY ANGIOGRAM N/A 02/23/2011   Procedure: LEFT AND RIGHT HEART CATHETERIZATION WITH CORONARY ANGIOGRAM;  Surgeon: [name], MD;  Location: [facility] CATH LAB;  Service: Cardiovascular;  Laterality: N/A;  . STERNAL WIRES REMOVAL N/A 05/01/2013   Procedure: STERNAL WIRES REMOVAL;  Surgeon: Gaye Pollack, MD;  Location: [facility] OR;  Service: Thoracic;  Laterality: N/A;       Home Medications    Prior to Admission medications   Medication Sig Start Date End Date Taking? Authorizing Provider  acetaminophen (TYLENOL) 500 MG tablet Take 1,000 mg by mouth daily as needed (pain). Reported on 04/21/2015    Historical Provider, MD  ELIQUIS 5 MG TABS tablet TAKE 1 TABLET TWICE A DAY 08/19/15   [name], MD  furosemide (LASIX) 40 MG tablet TAKE 1 TABLET DAILY 08/19/15   [name], MD  KLOR-CON M20 20 MEQ tablet TAKE 1 TABLET DAILY 08/19/15   [name], MD  meclizine (ANTIVERT) 25 MG tablet TAKE 1 TABLET 3 TIMES A DAY AS NEEDED FOR DIZZINESS    Colon Branch, MD  omeprazole (PRILOSEC) 40 MG capsule Take 40 mg by mouth 2 (two) times daily.    Historical Provider, MD  pravastatin (PRAVACHOL) 40 MG tablet Take 1 tablet (40 mg total) by mouth daily. 09/09/15   Colon Branch, MD  tiotropium (SPIRIVA HANDIHALER) 18 MCG inhalation capsule Place 1 capsule (18 mcg total) into inhaler and inhale daily. 05/23/15   Colon Branch, MD  traMADol (ULTRAM) 50 MG tablet Take 1 tablet (50 mg total) by mouth every 12 (twelve) hours as needed. for pain 07/19/15   Colon Branch, MD    Family History Family History  Problem Relation Age of Onset  . Heart attack Father 92    MI    . Diabetes Neg Hx   . Colon cancer Neg Hx   . Anesthesia problems Neg Hx   . Hypotension Neg Hx   . Malignant hyperthermia Neg Hx   . Pseudochol deficiency Neg Hx   . Stomach cancer Neg Hx   . Esophageal cancer Neg Hx   . Rectal cancer Neg Hx   . Prostate cancer Brother 14    Social History Social History  Substance Use Topics  . Smoking status: Former Smoker    Packs/day: 2.00    Years: 30.00    Types: Cigarettes  . Smokeless tobacco: Never Used     Comment: quit smoking in 1989  . Alcohol use No     Allergies  Review of patient's allergies indicates no known allergies.   Review of Systems Review of Systems  HENT: Positive for trouble swallowing.   All other systems reviewed and are negative.    Physical Exam Updated Vital Signs BP 115/79 (BP Location: Right Arm)   Pulse 90   Temp 98 F (36.7 C) (Oral)   Resp 18   SpO2 99%   Physical Exam  Constitutional: He is oriented to person, place, and time. He appears well-developed and well-nourished.  Elderly but overall appears well  HENT:  Head: Normocephalic and atraumatic.  Mouth/Throat: Oropharynx is clear and moist.  Oropharynx clear, handling secretions well, normal phonation without stridor; reports sensation of food bolus in distal esophagus  Eyes: Conjunctivae and EOM are normal. Pupils are equal, round, and reactive to light.  Neck: Normal range of motion.  Cardiovascular: Normal rate, regular rhythm and normal heart sounds.   Pulmonary/Chest: Effort normal and breath sounds normal.  Abdominal: Soft. Bowel sounds are normal. There is no tenderness. There is no rigidity and no guarding.  Soft, no distention, no focal tenderness  Musculoskeletal: Normal range of motion.  Neurological: He is alert and oriented to person, place, and time.  Skin: Skin is warm and dry.  Psychiatric: He has a normal mood and affect.  Nursing note and vitals reviewed.    ED Treatments / Results  Labs (all labs  ordered are listed, but only abnormal results are displayed) Labs Reviewed  BASIC METABOLIC PANEL - Abnormal; Notable for the following:       Result Value   Glucose, Bld 129 (*)    All other components within normal limits  CBC WITH DIFFERENTIAL/PLATELET    EKG  EKG Interpretation  Date/Time:  Saturday October 29 2015 09:01:02 EDT Ventricular Rate:  96 PR Interval:    QRS Duration: 132 QT Interval:  384 QTC Calculation: 486 R Axis:   -49 Text Interpretation:  Sinus tachycardia Multiple premature complexes, vent & supraven Probable left atrial enlargement RBBB and LAFB Left ventricular hypertrophy Minimal ST elevation, lateral leads No significant change was found Confirmed by Wyvonnia Dusky  MD, STEPHEN ([id number]) on 10/29/2015 9:11:02 AM       Radiology Dg Chest 2 View  Result Date: 10/29/2015 CLINICAL DATA:  Food impaction EXAM: CHEST  2 VIEW COMPARISON:  05/13/2013 FINDINGS: Mild cardiomegaly. Mild peribronchial thickening and interstitial prominence. Bibasilar opacities could reflect atelectasis or scarring. No effusions or acute bony abnormality. IMPRESSION: Suspect mild bronchitic changes.  Bibasilar atelectasis or scarring. Electronically Signed   By: Rolm Baptise M.D.   On: 10/29/2015 09:29   Procedures Procedures (including critical care time)  Medications Ordered in ED Medications  sodium chloride 0.9 % bolus 500 mL (0 mLs Intravenous Stopped 10/29/15 1145)  glucagon (human recombinant) (GLUCAGEN) injection 1 mg (1 mg Intravenous Given 10/29/15 1034)     Initial Impression / Assessment and Plan / ED Course  I have reviewed the triage vital signs and the nursing notes.  Pertinent labs & imaging results that were available during my care of the patient were reviewed by me and considered in my medical decision making (see chart for details).  Clinical Course   82 year old male here with food impaction. He reports history of esophageal strictures with same. He is afebrile and  nontoxic. He is currently handling his secretions well. He reports sensation of food bolus that his distal esophagus. He denies any current abdominal pain. Has not been able to eat or drink since yesterday. We'll plan  for chest x-ray, basic labs. Patient given IV fluids, trial of glucagon.  10:55 AM Delay in meds due to difficulties obtaining IV access.  Patient given glucagon without change in his symptoms.  Still not able to tolerate PO.  States feels like his prior food impactions.  Will discuss with GI.  12:19 PM Dr. Hilarie Fredrickson has completed endoscopy-- esophagus clear, food bolus passed?  He does report noted stricture which was not dilated due to patient's use of eliquis.  Can have this done OP.  Soft diet for now, follow-up in clinic.    1:37 PM Patient now tolerating oral fluids and crackers with ease.  States he feels better.  Question if his prior food bolus passed.  Will have him follow-up with Dr. Ardis Hughs as an outpatient.  Soft diet instructions for home.  Discussed plan with patient and family, they acknowledged understanding and agreed with plan of care.  Return precautions given for new or worsening symptoms.  Case discussed with attending physician, Dr. Wyvonnia Dusky, who evaluated patient and agrees with assessment and plan of care.  Final Clinical Impressions(s) / ED Diagnoses   Final diagnoses:  Difficulty swallowing    New Prescriptions New Prescriptions   No medications on file     Larene Pickett, Hershal Coria 10/29/15 [place], MD 10/29/15 [phone]

## 2015-10-29 NOTE — Discharge Instructions (Signed)
Continue your regular home medications. Recommend soft diet for the next few days-- try to avoid large pieces of meat, bread, etc. Follow-up with Dr. Ardis Hughs. Return to the ED for new or worsening symptoms.

## 2015-10-29 NOTE — ED Triage Notes (Signed)
Pt in from home with c/o trouble swallowing since eating a steak sandwich yesterday at 1200. Pt has had to have esophagus stretched before, last done 1 year ago. Pt currently unable to swallow liquids or solids since 7/28. Denies throat swelling, c/o feeling piece of food still lodged in throat.

## 2015-10-31 ENCOUNTER — Telehealth: Payer: Self-pay | Admitting: Gastroenterology

## 2015-10-31 ENCOUNTER — Encounter (HOSPITAL_COMMUNITY): Payer: Self-pay | Admitting: Internal Medicine

## 2015-10-31 MED ORDER — TRAMADOL HCL 50 MG PO TABS: 50.0000 mg | ORAL_TABLET | Freq: Two times a day (BID) | ORAL | 1 refills | Status: DC | PRN

## 2015-10-31 NOTE — Telephone Encounter (Signed)
Ok #60, print 2 RXs

## 2015-10-31 NOTE — Telephone Encounter (Signed)
Rx faxed to [facility]. Pt informed via MyChart.

## 2015-10-31 NOTE — [facility] Notes (Signed)
Ulice Dash, Thanks  Patty, HE needs EGD with balloon dilation.  At [facility], next available EUS Thursday (next week I believe).  We'll need 'anticoagulation letter' from his cardiologist about holding his eliquis 1 day prior to the dilation.  Thanks

## 2015-10-31 NOTE — Telephone Encounter (Signed)
Rx printed, awaiting MD signature.  

## 2015-10-31 NOTE — Telephone Encounter (Signed)
Pt is requesting refill on Tramadol.  Last OV: 08/22/2015 Last Fill: 07/19/2015 #60 and 0RF UDS: 10/19/2014 Low risk  Please advise.

## 2015-10-31 NOTE — Telephone Encounter (Signed)
Ulice Dash, Thanks  Kyera Felan, HE needs EGD with balloon dilation.  At [facility], next available EUS Thursday (next week I believe).  We'll need 'anticoagulation letter' from his cardiologist about holding his eliquis 1 day prior to the dilation.  Thanks

## 2015-11-01 NOTE — Telephone Encounter (Signed)
Fernando Bears, MD  Barron Alvine, RN        Janari Gagner  This bounced back to me  Not sure why  Still trying to figure out this new EPIC upgrade  This was a Ardis Hughs patient who needs OV vs. Straight to EGD for dilation  JMP

## 2015-11-02 ENCOUNTER — Other Ambulatory Visit: Payer: Self-pay

## 2015-11-02 DIAGNOSIS — R1310 Dysphagia, unspecified: Secondary | ICD-10-CM

## 2015-11-02 NOTE — Telephone Encounter (Signed)
  11/02/2015   RE: RAVION PINCHBACK DOB: 1934-01-05 MRN: [id number]   Dear Dr Burt Knack,    We have scheduled the above patient for an endoscopic procedure. Our records show that he is on anticoagulation therapy.   Please advise as to how long the patient may come off his therapy of Eliquis prior to the procedure, which is scheduled for 11/10/15.  Please fax back/ or route the completed form to Zayvien Canning at [phone].   Sincerely,    Christian Mate RN

## 2015-11-02 NOTE — Telephone Encounter (Signed)
EGD scheduled, pt instructed and medications reviewed.  Patient instructions mailed to home.  Patient to call with any questions or concerns.  

## 2015-11-03 ENCOUNTER — Encounter (HOSPITAL_COMMUNITY): Payer: Self-pay | Admitting: *Deleted

## 2015-11-03 NOTE — Telephone Encounter (Signed)
May stop Eliquis 48 hours prior to endoscopy. thanks

## 2015-11-04 ENCOUNTER — Other Ambulatory Visit: Payer: Self-pay

## 2015-11-04 NOTE — Telephone Encounter (Signed)
The pt has been notified to stop eliquis for 48 hours prior to the procedure.  He verbalized understanding.

## 2015-11-04 NOTE — Telephone Encounter (Signed)
Left message on machine to call back  

## 2015-11-10 ENCOUNTER — Ambulatory Visit (HOSPITAL_COMMUNITY)
Admission: RE | Admit: 2015-11-10 | Discharge: 2015-11-10 | Disposition: A | Discharge status: Home / Self Care | Payer: Medicare Other | Source: Ambulatory Visit | Attending: Gastroenterology | Admitting: Gastroenterology

## 2015-11-10 ENCOUNTER — Ambulatory Visit (HOSPITAL_COMMUNITY): Payer: Medicare Other | Admitting: Anesthesiology

## 2015-11-10 ENCOUNTER — Encounter (HOSPITAL_COMMUNITY): Payer: Self-pay

## 2015-11-10 ENCOUNTER — Encounter (HOSPITAL_COMMUNITY)
Admission: RE | Disposition: A | Discharge status: Home / Self Care | Payer: Self-pay | Source: Ambulatory Visit | Attending: Gastroenterology

## 2015-11-10 ENCOUNTER — Telehealth: Payer: Self-pay

## 2015-11-10 DIAGNOSIS — Z952 Presence of prosthetic heart valve: Secondary | ICD-10-CM | POA: Insufficient documentation

## 2015-11-10 DIAGNOSIS — I10 Essential (primary) hypertension: Secondary | ICD-10-CM | POA: Insufficient documentation

## 2015-11-10 DIAGNOSIS — J449 Chronic obstructive pulmonary disease, unspecified: Secondary | ICD-10-CM | POA: Diagnosis not present

## 2015-11-10 DIAGNOSIS — R42 Dizziness and giddiness: Secondary | ICD-10-CM | POA: Diagnosis not present

## 2015-11-10 DIAGNOSIS — Z87891 Personal history of nicotine dependence: Secondary | ICD-10-CM | POA: Insufficient documentation

## 2015-11-10 DIAGNOSIS — K449 Diaphragmatic hernia without obstruction or gangrene: Secondary | ICD-10-CM | POA: Diagnosis not present

## 2015-11-10 DIAGNOSIS — R1310 Dysphagia, unspecified: Secondary | ICD-10-CM

## 2015-11-10 DIAGNOSIS — E1151 Type 2 diabetes mellitus with diabetic peripheral angiopathy without gangrene: Secondary | ICD-10-CM | POA: Insufficient documentation

## 2015-11-10 DIAGNOSIS — I272 Other secondary pulmonary hypertension: Secondary | ICD-10-CM | POA: Insufficient documentation

## 2015-11-10 DIAGNOSIS — Z7901 Long term (current) use of anticoagulants: Secondary | ICD-10-CM | POA: Diagnosis not present

## 2015-11-10 DIAGNOSIS — I714 Abdominal aortic aneurysm, without rupture: Secondary | ICD-10-CM | POA: Diagnosis not present

## 2015-11-10 DIAGNOSIS — E785 Hyperlipidemia, unspecified: Secondary | ICD-10-CM | POA: Diagnosis not present

## 2015-11-10 DIAGNOSIS — K219 Gastro-esophageal reflux disease without esophagitis: Secondary | ICD-10-CM | POA: Insufficient documentation

## 2015-11-10 DIAGNOSIS — Z79899 Other long term (current) drug therapy: Secondary | ICD-10-CM | POA: Diagnosis not present

## 2015-11-10 DIAGNOSIS — K222 Esophageal obstruction: Secondary | ICD-10-CM | POA: Diagnosis not present

## 2015-11-10 DIAGNOSIS — I480 Paroxysmal atrial fibrillation: Secondary | ICD-10-CM | POA: Insufficient documentation

## 2015-11-10 DIAGNOSIS — I252 Old myocardial infarction: Secondary | ICD-10-CM | POA: Diagnosis not present

## 2015-11-10 DIAGNOSIS — E1142 Type 2 diabetes mellitus with diabetic polyneuropathy: Secondary | ICD-10-CM | POA: Diagnosis not present

## 2015-11-10 DIAGNOSIS — I2510 Atherosclerotic heart disease of native coronary artery without angina pectoris: Secondary | ICD-10-CM | POA: Diagnosis not present

## 2015-11-10 HISTORY — PX: ESOPHAGOGASTRODUODENOSCOPY (EGD) WITH PROPOFOL: SHX5813

## 2015-11-10 LAB — GLUCOSE, CAPILLARY: GLUCOSE-CAPILLARY: 116 mg/dL — AB (ref 65–99)

## 2015-11-10 SURGERY — ESOPHAGOGASTRODUODENOSCOPY (EGD) WITH PROPOFOL
Anesthesia: Monitor Anesthesia Care

## 2015-11-10 MED ORDER — LACTATED RINGERS IV SOLN
INTRAVENOUS | Status: DC
  Administered 2015-11-10: 1000 mL via INTRAVENOUS

## 2015-11-10 MED ORDER — SODIUM CHLORIDE 0.9 % IV SOLN: INTRAVENOUS | Status: DC

## 2015-11-10 MED ORDER — PROPOFOL 10 MG/ML IV BOLUS
INTRAVENOUS | Status: DC | PRN
  Administered 2015-11-10 (×4): 20 mg via INTRAVENOUS
  Administered 2015-11-10: 10 mg via INTRAVENOUS
  Administered 2015-11-10: 40 mg via INTRAVENOUS
  Administered 2015-11-10: 20 mg via INTRAVENOUS

## 2015-11-10 MED ORDER — PROPOFOL 10 MG/ML IV BOLUS
INTRAVENOUS | Status: AC
  Filled 2015-11-10: qty 20

## 2015-11-10 SURGICAL SUPPLY — 14 items

## 2015-11-10 NOTE — Discharge Instructions (Signed)
Gastrointestinal Endoscopy, Care After °Refer to this sheet in the next few weeks. These instructions provide you with information on caring for yourself after your procedure. Your caregiver may also give you more specific instructions. Your treatment has been planned according to current medical practices, but problems sometimes occur. Call your caregiver if you have any problems or questions after your procedure. °HOME CARE INSTRUCTIONS °· If you were given medicine to help you relax (sedative), do not drive, operate machinery, or sign important documents for 24 hours. °· Avoid alcohol and hot or warm beverages for the first 24 hours after the procedure. °· Only take over-the-counter or prescription medicines for pain, discomfort, or fever as directed by your caregiver. You may resume taking your normal medicines unless your caregiver tells you otherwise. Ask your caregiver when you may resume taking medicines that may cause bleeding, such as aspirin, clopidogrel, or warfarin. °· You may return to your normal diet and activities on the day after your procedure, or as directed by your caregiver. Walking may help to reduce any bloated feeling in your abdomen. °· Drink enough fluids to keep your urine clear or pale yellow. °· You may gargle with salt water if you have a sore throat. °SEEK IMMEDIATE MEDICAL CARE IF: °· You have severe nausea or vomiting. °· You have severe abdominal pain, abdominal cramps that last longer than 6 hours, or abdominal swelling (distention). °· You have severe shoulder or back pain. °· You have trouble swallowing. °· You have shortness of breath, your breathing is shallow, or you are breathing faster than normal. °· You have a fever or a rapid heartbeat. °· You vomit blood or material that looks like coffee grounds. °· You have bloody, black, or tarry stools. °MAKE SURE YOU: °· Understand these instructions. °· Will watch your condition. °· Will get help right away if you are not doing  well or get worse. °  °This information is not intended to replace advice given to you by your health care provider. Make sure you discuss any questions you have with your health care provider. °  °Document Released: 11/01/2003 Document Revised: 04/09/2014 Document Reviewed: 06/19/2011 °Elsevier Interactive Patient Education ©2016 Elsevier Inc[employer] ° °

## 2015-11-10 NOTE — Anesthesia Preprocedure Evaluation (Signed)
Anesthesia Evaluation  Patient identified by MRN, date of birth, ID band Patient awake    Reviewed: Allergy & Precautions, NPO status , Patient's Chart, lab work & pertinent test results  Airway Mallampati: II  TM Distance: >3 FB Neck ROM: Full    Dental   Pulmonary COPD, former smoker,    Pulmonary exam normal        Cardiovascular + CAD and + Past MI  Normal cardiovascular exam     Neuro/Psych    GI/Hepatic GERD  Medicated and Controlled,  Endo/Other  diabetes  Renal/GU      Musculoskeletal   Abdominal   Peds  Hematology   Anesthesia Other Findings   Reproductive/Obstetrics                             Anesthesia Physical Anesthesia Plan  ASA: III  Anesthesia Plan: MAC   Post-op Pain Management:    Induction: Intravenous  Airway Management Planned: Mask  Additional Equipment:   Intra-op Plan:   Post-operative Plan:   Informed Consent: I have reviewed the patients History and Physical, chart, labs and discussed the procedure including the risks, benefits and alternatives for the proposed anesthesia with the patient or authorized representative who has indicated his/her understanding and acceptance.     Plan Discussed with: CRNA and Surgeon  Anesthesia Plan Comments:         Anesthesia Quick Evaluation

## 2015-11-10 NOTE — Op Note (Signed)
[facility] Patient Name: Fernando Moyer Procedure Date: 11/10/2015 MRN: [id number] Attending MD: Milus Banister , MD Date of Birth: [birth year] CSN: [id number] Age: 82 Admit Type: Outpatient Procedure:                Upper GI endoscopy Indications:              Dysphagia, intermittent; dilated to 56mm with                            balloon dilator 03/2015; presented with esophageal                            food impaction 2 weeks ago, urgent EGD Dr. Hilarie Fredrickson                            found that food had passed, unable to dilated at                            that time due to daily bloodthinner use. Providers:                Milus Banister, MD, Cleda Daub, RN, William Dalton, Technician Referring MD:              Medicines:                Monitored Anesthesia Care Complications:            No immediate complications. Estimated blood loss:                            None. Estimated Blood Loss:     Estimated blood loss: none. Procedure:                Pre-Anesthesia Assessment:                           - Prior to the procedure, a History and Physical                            was performed, and patient medications and                            allergies were reviewed. The patient's tolerance of                            previous anesthesia was also reviewed. The risks                            and benefits of the procedure and the sedation                            options and risks were discussed with the patient.  All questions were answered, and informed consent                            was obtained. Prior Anticoagulants: The patient has                            taken Eliquis (apixaban), last dose was 2 days                            prior to procedure. ASA Grade Assessment: III - A                            patient with severe systemic disease. After                            reviewing the risks  and benefits, the patient was                            deemed in satisfactory condition to undergo the                            procedure.                           After obtaining informed consent, the endoscope was                            passed under direct vision. Throughout the                            procedure, the patient's blood pressure, pulse, and                            oxygen saturations were monitored continuously. The                            Endoscope was introduced through the mouth, and                            advanced to the second part of duodenum. The upper                            GI endoscopy was accomplished without difficulty.                            The patient tolerated the procedure well. Findings:      One moderate benign-appearing, intrinsic stenosis was found at the       gastroesophageal junction. This measured 1.2 cm (inner diameter) and was       traversed. A TTS dilator was passed through the scope. Dilation with a       15-16.5-18 mm balloon dilator was performed to 18 mm. There was the       typical superficial mucosal tear and minor self limited oozing of blood  following dilation.      A medium-sized hiatal hernia was present.      The exam was otherwise without abnormality. Impression:               - Benign-appearing esophageal stenosis. Dilated to                            10mm (see above).                           - Medium-sized hiatal hernia.                           - The examination was otherwise normal.                           - No specimens collected. Moderate Sedation:      N/A- Per Anesthesia Care Recommendation:           - Patient has a contact number available for                            emergencies. The signs and symptoms of potential                            delayed complications were discussed with the                            patient. Return to normal activities tomorrow.                             Written discharge instructions were provided to the                            patient.                           - Resume previous diet.                           - Continue present medications.                           - Repeat upper endoscopy in 3 weeks for retreatment                            (Dr. Ardis Hughs' office will call you to arrange this).                            You need to stay on twice daily PPI (best taken                            20-30 min prior to breakfast and dinner meals). It                            is VERY important that you chew your food well,  eat                            slowly and take small bites.                           - Resume bloodthinner tomorrow. Procedure Code(s):        --- Professional ---                           [phone], Esophagogastroduodenoscopy, flexible,                            transoral; with transendoscopic balloon dilation of                            esophagus (less than 30 mm diameter) Diagnosis Code(s):        --- Professional ---                           K22.2, Esophageal obstruction                           K44.9, Diaphragmatic hernia without obstruction or                            gangrene                           R13.10, Dysphagia, unspecified CPT copyright 2016 American Medical Association. All rights reserved. The codes documented in this report are preliminary and upon coder review may  be revised to meet current compliance requirements. Milus Banister, MD 11/10/2015 9:39:22 AM This report has been signed electronically. Number of Addenda: 0

## 2015-11-10 NOTE — Anesthesia Postprocedure Evaluation (Signed)
Anesthesia Post Note  Patient: Fernando Moyer  Procedure(s) Performed: Procedure(s) (LRB): ESOPHAGOGASTRODUODENOSCOPY (EGD) WITH PROPOFOL (N/A)  Patient location during evaluation: PACU Anesthesia Type: MAC Level of consciousness: awake and alert Pain management: pain level controlled Vital Signs Assessment: post-procedure vital signs reviewed and stable Respiratory status: spontaneous breathing, nonlabored ventilation, respiratory function stable and patient connected to nasal cannula oxygen Cardiovascular status: stable and blood pressure returned to baseline Anesthetic complications: no    Last Vitals:  Vitals:   11/10/15 1000 11/10/15 1010  BP:  (!) 160/82  Pulse:    Resp: (!) 21 16  Temp:      Last Pain:  Vitals:   11/10/15 0938  TempSrc: Axillary  PainSc: 0-No pain                 Johnnetta Holstine DAVID

## 2015-11-10 NOTE — H&P (View-Only) (Signed)
Referring Provider: Quincy Carnes, Hershal Coria, ED Primary Care Physician:  [name], MD Primary Gastroenterologist:  Dr. Ardis Hughs  Reason for Consultation:  Food impaction  HPI: Fernando Moyer is a 82 y.o. male with history of AAA, aortic stenosis status post valve replacement, hypertension, diabetes, COPD, chronic back pain, GERD, hyperlipidemia, paroxysmal A. fib on Eliquis, presenting to the ED with food impaction. Patient has a history of esophageal strictures requiring dilatation in the past.  Last had EGD in 03/2015. States yesterday he was trying to eat a steak sandwich from [employer] but continued having the sensation that there was something stuck in the bottom of his throat. He reported that he has not been able to eat or drink anything since this time.  He has been having any difficulty handling his secretions.  No fever, chills, sweats.  Denies any abdominal pain currently.  VSS.  Last took Eliquis yesterday morning as he has not been able to swallow since dinner yesterday.  EGD with Dr. Ardis Hughs 03/2015--  Endoscopic Impression: There was a large hiatal hernia (5-6cm). There was a thick Schatzki's ring vs. focal peptic stricture with lumen 35mm, this was dilated with a TTS balloon held inflated to 14mm for 1 minute. There was typical superficial mucosal tear and self limited oozing of blood following dilation. The examination was otherwise normal. RECOMMENDATIONS: Continue PPI twice daily. Continue to chew your food well, eat slowly and take small bites. Please call if the swallowing difficulty returns.   Past Medical History:  Diagnosis Date  . AAA (abdominal aortic aneurysm) ([place]) 05/2008   PER CARDIAC CATH  . Aortic stenosis 05/2008   PER CARDIAC CATH, MILD, MITRAL STENOSIS  . Back pain    cause unknown but does get Cortisone injection every now and then  . Benign neoplasm of colon   . Blood transfusion without reported diagnosis   . Bowen's disease    right arm BX: referred to  dermatology  . Carotid artery occlusion    last u/s 3-11...Marland Kitchenper vasc.surgery 2007 cath: nonobstructive CAD. mil;d MS, trivial AoS, small AAA,  . Cataract   . Chronic back pain   . COPD (chronic obstructive pulmonary disease) ([place])   . Coronary artery disease 05/2008   MILD, MEDICAL MANAGEMENT  . Diabetes mellitus ([place]) 09/07/2011   no per pt- no medications  . Dizziness    takes Meclizine daily  . Dyspnea     w/u included a cath and PFT's, sxms thought to be pulmonary  . GERD with stricture    w/hx of stricture  . Hyperlipidemia    takes Pravastatin daily  . Mitral stenosis 05/2008   PER CARDIAC CATH  . Myocardial infarction ([place]) 2012  . Peripheral neuropathy ([facility])    right thigh; "it's been that way for years"  . Pulmonary hypertension ([place])   . PVD (peripheral vascular disease) ([place]) 12/07   per cath 12/07....iliac  . Stricture and stenosis of esophagus     Past Surgical History:  Procedure Laterality Date  . AORTIC VALVE REPLACEMENT  08/17/2011   Procedure: AORTIC VALVE REPLACEMENT (AVR);  Surgeon: Gaye Pollack, MD;  Location: [facility];  Service: Open Heart Surgery;  Laterality: N/A;  . APPENDECTOMY    . CARDIAC CATHETERIZATION  02/2011   LAD 50, CFX OK, RCA40, EF 55%; PCWP 17, AoV  0.9 cm squared  . COLONOSCOPY    . ENDARTERECTOMY  08-2010   Right carotid endarterectomy    . ESOPHAGEAL DILATION    .  eye lid surgery Bilateral ~ 07-2015  . EYE SURGERY  10/14/13   cataract surgery L eye   . LEFT AND RIGHT HEART CATHETERIZATION WITH CORONARY ANGIOGRAM N/A 02/23/2011   Procedure: LEFT AND RIGHT HEART CATHETERIZATION WITH CORONARY ANGIOGRAM;  Surgeon: [name], MD;  Location: [facility] CATH LAB;  Service: Cardiovascular;  Laterality: N/A;  . STERNAL WIRES REMOVAL N/A 05/01/2013   Procedure: STERNAL WIRES REMOVAL;  Surgeon: Gaye Pollack, MD;  Location: [facility] OR;  Service: Thoracic;  Laterality: N/A;    Prior to Admission medications   Medication Sig Start Date End Date Taking?  Authorizing Provider  acetaminophen (TYLENOL) 500 MG tablet Take 1,000 mg by mouth daily as needed (pain). Reported on 04/21/2015    Historical Provider, MD  ELIQUIS 5 MG TABS tablet TAKE 1 TABLET TWICE A DAY 08/19/15   [name], MD  furosemide (LASIX) 40 MG tablet TAKE 1 TABLET DAILY 08/19/15   [name], MD  KLOR-CON M20 20 MEQ tablet TAKE 1 TABLET DAILY 08/19/15   [name], MD  meclizine (ANTIVERT) 25 MG tablet TAKE 1 TABLET 3 TIMES A DAY AS NEEDED FOR DIZZINESS    Colon Branch, MD  omeprazole (PRILOSEC) 40 MG capsule Take 40 mg by mouth 2 (two) times daily.    Historical Provider, MD  pravastatin (PRAVACHOL) 40 MG tablet Take 1 tablet (40 mg total) by mouth daily. 09/09/15   Colon Branch, MD  tiotropium (SPIRIVA HANDIHALER) 18 MCG inhalation capsule Place 1 capsule (18 mcg total) into inhaler and inhale daily. 05/23/15   Colon Branch, MD  traMADol (ULTRAM) 50 MG tablet Take 1 tablet (50 mg total) by mouth every 12 (twelve) hours as needed. for pain 07/19/15   Colon Branch, MD    No current facility-administered medications for this encounter.    Current Outpatient Prescriptions  Medication Sig Dispense Refill  . acetaminophen (TYLENOL) 500 MG tablet Take 1,000 mg by mouth daily as needed (pain). Reported on 04/21/2015    . ELIQUIS 5 MG TABS tablet TAKE 1 TABLET TWICE A DAY 180 tablet 1  . furosemide (LASIX) 40 MG tablet TAKE 1 TABLET DAILY 90 tablet 1  . KLOR-CON M20 20 MEQ tablet TAKE 1 TABLET DAILY 90 tablet 1  . meclizine (ANTIVERT) 25 MG tablet TAKE 1 TABLET 3 TIMES A DAY AS NEEDED FOR DIZZINESS 30 tablet 1  . omeprazole (PRILOSEC) 40 MG capsule Take 40 mg by mouth 2 (two) times daily.    . pravastatin (PRAVACHOL) 40 MG tablet Take 1 tablet (40 mg total) by mouth daily. 90 tablet 2  . tiotropium (SPIRIVA HANDIHALER) 18 MCG inhalation capsule Place 1 capsule (18 mcg total) into inhaler and inhale daily. 90 capsule 2  . traMADol (ULTRAM) 50 MG tablet Take 1 tablet (50 mg total) by mouth  every 12 (twelve) hours as needed. for pain 60 tablet 0    Allergies as of 10/29/2015  . (No Known Allergies)    Family History  Problem Relation Age of Onset  . Heart attack Father 31    MI  . Prostate cancer Brother 61  . Diabetes Neg Hx   . Colon cancer Neg Hx   . Anesthesia problems Neg Hx   . Hypotension Neg Hx   . Malignant hyperthermia Neg Hx   . Pseudochol deficiency Neg Hx   . Stomach cancer Neg Hx   . Esophageal cancer Neg Hx   . Rectal cancer Neg Hx     Social  History   Social History  . Marital status: Widowed    Spouse name: N/A  . Number of children: 2  . Years of education: N/A   Occupational History  . 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 Smoking status: Former Smoker    Packs/day: 2.00    Years: 30.00    Types: Cigarettes  . Smokeless tobacco: Never Used     Comment: quit smoking in 1989  . Alcohol use No  . Drug use: No  . Sexual activity: Not on file   Other Topics Concern  . Not on file   Social History Narrative   LIVES BY HIMSELF    HAS 2 GROWN SONS, 5 G-KIDS (Melissa usually comes with him for his appointments)           Review of Systems: Ten point ROS is O/W negative except as mentioned in HPI.  Physical Exam: Vital signs in last 24 hours: Pulse Rate:  [100-104] 100 (07/29 1100) BP: (154-186)/(65-88) 186/73 (07/29 1100) SpO2:  [92 %-93 %] 92 % (07/29 1100)   Gen: awake, alert, NAD HEENT: anicteric, op clear CV: irreg, mild tachycardia Pulm: CTA b/l Abd: soft, NT/ND, +BS throughout Ext: no c/c/e Neuro: nonfocal   Lab Results:  Recent Labs  10/29/15 0853  WBC 7.3  HGB 13.7  HCT 41.2  PLT 213   BMET  Recent Labs  10/29/15 0853  NA 140  K 4.0  CL 109  CO2 25  GLUCOSE 129*  BUN 11  CREATININE 0.99  CALCIUM 9.6   Studies/Results: Dg Chest 2 View  Result Date: 10/29/2015 CLINICAL DATA:  Food impaction EXAM:  CHEST  2 VIEW COMPARISON:  05/13/2013 FINDINGS: Mild cardiomegaly. Mild peribronchial thickening and interstitial prominence. Bibasilar opacities could reflect atelectasis or scarring. No effusions or acute bony abnormality. IMPRESSION: Suspect mild bronchitic changes.  Bibasilar atelectasis or scarring. Electronically Signed   By: Rolm Baptise M.D.   On: 10/29/2015 09:29  IMPRESSION:  -82 year old male with history of esophageal stricture and food impaction.  Suspected to have a food impaction currently after eating a steak sandwich at [employer] yesterday.  PLAN: -EGD today in ED with removal of suspected food bolus.  Will not be able to dilate given recent impaction and the use of Eliquis.  Will arrange followup with Dr. Ardis Hughs for repeat dilation once Eliquis can be held appropriately with permission from Dr. Burt Knack (cardiology). -The nature of the procedure, as well as the risks, benefits, and alternatives were carefully and thoroughly reviewed with the patient. Ample time for discussion and questions allowed. The patient understood, was satisfied, and agreed to proceed.      ZEHR, JESSICA D.  10/29/2015, 11:20 AM  Pager number [phone]  Lajuan Lines. Pyrtle, M.D.  10/29/2015

## 2015-11-10 NOTE — Interval H&P Note (Signed)
History and Physical Interval Note:  11/10/2015 8:43 AM  Fernando Moyer  has presented today for surgery, with the diagnosis of dysphagia  The various methods of treatment have been discussed with the patient and family. After consideration of risks, benefits and other options for treatment, the patient has consented to  Procedure(s): ESOPHAGOGASTRODUODENOSCOPY (EGD) WITH PROPOFOL (N/A) as a surgical intervention .  The patient's history has been reviewed, patient examined, no change in status, stable for surgery.  I have reviewed the patient's chart and labs.  Questions were answered to the patient's satisfaction.     Milus Banister

## 2015-11-10 NOTE — Telephone Encounter (Signed)
-----   Message from Milus Banister, MD sent at 11/10/2015  9:41 AM EDT ----- He needs repeat EGD at [facility] with dilation, holding eliquis 2 days prior.  In 3-4 weeks. I don't think we need repeat bloodthinner note about this, just had one filled out last week. t ahnks

## 2015-11-10 NOTE — Transfer of Care (Signed)
Immediate Anesthesia Transfer of Care Note  Patient: Fernando Moyer  Procedure(s) Performed: Procedure(s): ESOPHAGOGASTRODUODENOSCOPY (EGD) WITH PROPOFOL (N/A)  Patient Location: PACU  Anesthesia Type:MAC  Level of Consciousness: sedated  Airway & Oxygen Therapy: Patient Spontanous Breathing and Patient connected to nasal cannula oxygen  Post-op Assessment: Report given to RN and Post -op Vital signs reviewed and stable  Post vital signs: Reviewed and stable  Last Vitals:  Vitals:   11/10/15 0831  BP: (!) 166/74  Pulse: 88  Resp: 19  Temp: 36.5 C    Last Pain:  Vitals:   11/10/15 0831  TempSrc: Oral         Complications: No apparent anesthesia complications

## 2015-11-11 ENCOUNTER — Encounter (HOSPITAL_COMMUNITY): Payer: Self-pay | Admitting: Gastroenterology

## 2015-11-15 DIAGNOSIS — H04123 Dry eye syndrome of bilateral lacrimal glands: Secondary | ICD-10-CM | POA: Diagnosis not present

## 2015-11-15 DIAGNOSIS — H02002 Unspecified entropion of right lower eyelid: Secondary | ICD-10-CM | POA: Diagnosis not present

## 2015-11-15 DIAGNOSIS — H524 Presbyopia: Secondary | ICD-10-CM | POA: Diagnosis not present

## 2015-11-15 DIAGNOSIS — H02001 Unspecified entropion of right upper eyelid: Secondary | ICD-10-CM | POA: Diagnosis not present

## 2015-11-15 DIAGNOSIS — H25811 Combined forms of age-related cataract, right eye: Secondary | ICD-10-CM | POA: Diagnosis not present

## 2015-11-16 ENCOUNTER — Other Ambulatory Visit: Payer: Self-pay

## 2015-11-16 DIAGNOSIS — R1310 Dysphagia, unspecified: Secondary | ICD-10-CM

## 2015-11-16 NOTE — Telephone Encounter (Signed)
EGD scheduled, pt instructed and medications reviewed.  Patient instructions mailed to home.  Patient to call with any questions or concerns.  

## 2015-11-22 ENCOUNTER — Encounter (HOSPITAL_COMMUNITY): Payer: Self-pay | Admitting: *Deleted

## 2015-11-24 ENCOUNTER — Ambulatory Visit (HOSPITAL_COMMUNITY): Payer: Medicare Other | Admitting: Anesthesiology

## 2015-11-24 ENCOUNTER — Encounter (HOSPITAL_COMMUNITY): Payer: Self-pay

## 2015-11-24 ENCOUNTER — Encounter (HOSPITAL_COMMUNITY)
Admission: RE | Disposition: A | Discharge status: Home / Self Care | Payer: Self-pay | Source: Ambulatory Visit | Attending: Gastroenterology

## 2015-11-24 ENCOUNTER — Ambulatory Visit (HOSPITAL_COMMUNITY)
Admission: RE | Admit: 2015-11-24 | Discharge: 2015-11-24 | Disposition: A | Discharge status: Home / Self Care | Payer: Medicare Other | Source: Ambulatory Visit | Attending: Gastroenterology | Admitting: Gastroenterology

## 2015-11-24 DIAGNOSIS — J449 Chronic obstructive pulmonary disease, unspecified: Secondary | ICD-10-CM | POA: Insufficient documentation

## 2015-11-24 DIAGNOSIS — E785 Hyperlipidemia, unspecified: Secondary | ICD-10-CM | POA: Insufficient documentation

## 2015-11-24 DIAGNOSIS — Z7901 Long term (current) use of anticoagulants: Secondary | ICD-10-CM | POA: Diagnosis not present

## 2015-11-24 DIAGNOSIS — K449 Diaphragmatic hernia without obstruction or gangrene: Secondary | ICD-10-CM | POA: Diagnosis not present

## 2015-11-24 DIAGNOSIS — Z87891 Personal history of nicotine dependence: Secondary | ICD-10-CM | POA: Diagnosis not present

## 2015-11-24 DIAGNOSIS — Z952 Presence of prosthetic heart valve: Secondary | ICD-10-CM | POA: Insufficient documentation

## 2015-11-24 DIAGNOSIS — I739 Peripheral vascular disease, unspecified: Secondary | ICD-10-CM | POA: Diagnosis not present

## 2015-11-24 DIAGNOSIS — I10 Essential (primary) hypertension: Secondary | ICD-10-CM | POA: Diagnosis not present

## 2015-11-24 DIAGNOSIS — E1151 Type 2 diabetes mellitus with diabetic peripheral angiopathy without gangrene: Secondary | ICD-10-CM | POA: Insufficient documentation

## 2015-11-24 DIAGNOSIS — K222 Esophageal obstruction: Secondary | ICD-10-CM

## 2015-11-24 DIAGNOSIS — I080 Rheumatic disorders of both mitral and aortic valves: Secondary | ICD-10-CM | POA: Insufficient documentation

## 2015-11-24 DIAGNOSIS — K219 Gastro-esophageal reflux disease without esophagitis: Secondary | ICD-10-CM | POA: Insufficient documentation

## 2015-11-24 DIAGNOSIS — I480 Paroxysmal atrial fibrillation: Secondary | ICD-10-CM | POA: Insufficient documentation

## 2015-11-24 DIAGNOSIS — R1310 Dysphagia, unspecified: Secondary | ICD-10-CM

## 2015-11-24 DIAGNOSIS — I714 Abdominal aortic aneurysm, without rupture: Secondary | ICD-10-CM | POA: Diagnosis not present

## 2015-11-24 DIAGNOSIS — Z79899 Other long term (current) drug therapy: Secondary | ICD-10-CM | POA: Insufficient documentation

## 2015-11-24 DIAGNOSIS — I252 Old myocardial infarction: Secondary | ICD-10-CM | POA: Diagnosis not present

## 2015-11-24 DIAGNOSIS — E1142 Type 2 diabetes mellitus with diabetic polyneuropathy: Secondary | ICD-10-CM | POA: Insufficient documentation

## 2015-11-24 DIAGNOSIS — I2510 Atherosclerotic heart disease of native coronary artery without angina pectoris: Secondary | ICD-10-CM | POA: Diagnosis not present

## 2015-11-24 HISTORY — PX: ESOPHAGOGASTRODUODENOSCOPY (EGD) WITH PROPOFOL: SHX5813

## 2015-11-24 SURGERY — ESOPHAGOGASTRODUODENOSCOPY (EGD) WITH PROPOFOL
Anesthesia: Monitor Anesthesia Care

## 2015-11-24 MED ORDER — PROPOFOL 10 MG/ML IV BOLUS
INTRAVENOUS | Status: AC
  Filled 2015-11-24: qty 40

## 2015-11-24 MED ORDER — PROPOFOL 10 MG/ML IV BOLUS
INTRAVENOUS | Status: DC | PRN
  Administered 2015-11-24 (×2): 20 mg via INTRAVENOUS

## 2015-11-24 MED ORDER — SODIUM CHLORIDE 0.9 % IV SOLN: INTRAVENOUS | Status: DC

## 2015-11-24 MED ORDER — LIDOCAINE 2% (20 MG/ML) 5 ML SYRINGE
INTRAMUSCULAR | Status: DC | PRN
  Administered 2015-11-24: 100 mg via INTRAVENOUS

## 2015-11-24 MED ORDER — PROPOFOL 500 MG/50ML IV EMUL
INTRAVENOUS | Status: DC | PRN
Start: 2015-11-24 — End: 2015-11-24
  Administered 2015-11-24: 140 ug/kg/min via INTRAVENOUS

## 2015-11-24 MED ORDER — LIDOCAINE HCL (CARDIAC) 20 MG/ML IV SOLN
INTRAVENOUS | Status: AC
  Filled 2015-11-24: qty 5

## 2015-11-24 MED ORDER — LACTATED RINGERS IV SOLN
INTRAVENOUS | Status: DC
  Administered 2015-11-24: 1000 mL via INTRAVENOUS

## 2015-11-24 SURGICAL SUPPLY — 14 items

## 2015-11-24 NOTE — Anesthesia Preprocedure Evaluation (Addendum)
Anesthesia Evaluation  Patient identified by MRN, date of birth, ID band Patient awake and Patient unresponsive  General Assessment Comment:Still hoarse from last EGD.  Reviewed: Allergy & Precautions, NPO status , Patient's Chart, lab work & pertinent test results  Airway Mallampati: II  TM Distance: >3 FB Neck ROM: Full    Dental  (+) Missing, Poor Dentition, Dental Advisory Given Missing many teeth. Crowns present. Denies loose.:   Pulmonary shortness of breath, COPD, former smoker,    Pulmonary exam normal breath sounds clear to auscultation       Cardiovascular + CAD, + Past MI and + Peripheral Vascular Disease  Normal cardiovascular exam Rhythm:Regular Rate:Normal     Neuro/Psych  Headaches,  Neuromuscular disease negative psych ROS   GI/Hepatic Neg liver ROS, GERD  ,  Endo/Other  negative endocrine ROSdiabetes  Renal/GU negative Renal ROS  negative genitourinary   Musculoskeletal negative musculoskeletal ROS (+)   Abdominal   Peds negative pediatric ROS (+)  Hematology negative hematology ROS (+)   Anesthesia Other Findings   Reproductive/Obstetrics negative OB ROS                           Anesthesia Physical Anesthesia Plan  ASA: III  Anesthesia Plan: MAC   Post-op Pain Management:    Induction: Intravenous  Airway Management Planned:   Additional Equipment:   Intra-op Plan:   Post-operative Plan:   Informed Consent: I have reviewed the patients History and Physical, chart, labs and discussed the procedure including the risks, benefits and alternatives for the proposed anesthesia with the patient or authorized representative who has indicated his/her understanding and acceptance.   Dental advisory given  Plan Discussed with: CRNA  Anesthesia Plan Comments:         Anesthesia Quick Evaluation

## 2015-11-24 NOTE — Interval H&P Note (Signed)
History and Physical Interval Note:  11/24/2015 8:33 AM  Fernando Moyer  has presented today for surgery, with the diagnosis of dysphagia  The various methods of treatment have been discussed with the patient and family. After consideration of risks, benefits and other options for treatment, the patient has consented to  Procedure(s) with comments: ESOPHAGOGASTRODUODENOSCOPY (EGD) WITH PROPOFOL (N/A) - dil as a surgical intervention .  The patient's history has been reviewed, patient examined, no change in status, stable for surgery.  I have reviewed the patient's chart and labs.  Questions were answered to the patient's satisfaction.     Milus Banister

## 2015-11-24 NOTE — Transfer of Care (Signed)
Immediate Anesthesia Transfer of Care Note  Patient: Fernando Moyer  Procedure(s) Performed: Procedure(s) with comments: ESOPHAGOGASTRODUODENOSCOPY (EGD) WITH PROPOFOL (N/A) - dil  Patient Location: Endoscopy Unit  Anesthesia Type:MAC  Level of Consciousness: sedated  Airway & Oxygen Therapy: Patient Spontanous Breathing and Patient connected to nasal cannula oxygen  Post-op Assessment: Report given to RN and Post -op Vital signs reviewed and stable  Post vital signs: Reviewed and stable  Last Vitals:  Vitals:   11/24/15 0726  BP: (!) 162/63  Pulse: 84  Resp: 18  Temp: 36.4 C    Last Pain:  Vitals:   11/24/15 0726  TempSrc: Oral         Complications: No apparent anesthesia complications

## 2015-11-24 NOTE — Anesthesia Postprocedure Evaluation (Signed)
Anesthesia Post Note  Patient: Fernando Moyer  Procedure(s) Performed: Procedure(s) (LRB): ESOPHAGOGASTRODUODENOSCOPY (EGD) WITH PROPOFOL (N/A)  Patient location during evaluation: PACU Anesthesia Type: MAC Level of consciousness: awake and alert Pain management: pain level controlled Vital Signs Assessment: post-procedure vital signs reviewed and stable Respiratory status: spontaneous breathing, nonlabored ventilation, respiratory function stable and patient connected to nasal cannula oxygen Cardiovascular status: stable and blood pressure returned to baseline Anesthetic complications: no    Last Vitals:  Vitals:   11/24/15 0841 11/24/15 0850  BP: (!) 165/52 (!) 160/89  Pulse: 90 79  Resp: 20 17  Temp:      Last Pain:  Vitals:   11/24/15 0825  TempSrc: Oral                 Aviyana Sonntag J

## 2015-11-24 NOTE — Discharge Instructions (Signed)

## 2015-11-24 NOTE — Op Note (Signed)
[facility] Patient Name: Fernando Moyer Procedure Date: 11/24/2015 MRN: [id number] Attending MD: Milus Banister , MD Date of Birth: 1934/01/15 CSN: [id number] Age: 82 Admit Type: Outpatient Procedure:                Upper GI endoscopy Indications:              Dysphagia Dysphagia, intermittent; dilated with                            balloon dilator 03/2015; presented with esophageal                            food impaction 2 weeks ago, urgent EGD Dr. Hilarie Fredrickson                            found that food had passed, unable to dilated at                            that time due to daily bloodthinner use. EGD                            11/10/15 Dr. Ardis Hughs found Flowella, benign GE junction                            stricture, dilated to 71mm. Providers:                Milus Banister, MD, Cleda Daub, RN, William Dalton, Technician Referring MD:              Medicines:                Monitored Anesthesia Care Complications:            No immediate complications. Estimated blood loss:                            None. Estimated Blood Loss:     Estimated blood loss: none. Procedure:                Pre-Anesthesia Assessment:                           - Prior to the procedure, a History and Physical                            was performed, and patient medications and                            allergies were reviewed. The patient's tolerance of                            previous anesthesia was also reviewed. The risks  and benefits of the procedure and the sedation                            options and risks were discussed with the patient.                            All questions were answered, and informed consent                            was obtained. Prior Anticoagulants: The patient has                            taken Eliquis (apixaban), last dose was 3 days                            prior to procedure. ASA Grade  Assessment: III - A                            patient with severe systemic disease. After                            reviewing the risks and benefits, the patient was                            deemed in satisfactory condition to undergo the                            procedure.                           After obtaining informed consent, the endoscope was                            passed under direct vision. Throughout the                            procedure, the patient's blood pressure, pulse, and                            oxygen saturations were monitored continuously. The                            [id number]) scope was introduced through the                            mouth, and advanced to the second part of duodenum.                            The upper GI endoscopy was accomplished without                            difficulty. The patient tolerated the procedure  well. Scope In: Scope Out: Findings:      One moderate benign-appearing, intrinsic stenosis was found at the       gastroesophageal junction (Schataki's ring or focal peptic stricture).       This measured 1.4 cm (inner diameter) and was traversed. A TTS dilator       was passed through the scope. Dilation with an 18-19-20 mm balloon       dilator was performed to 20 mm.      A medium-sized hiatal hernia was present.      The exam was otherwise without abnormality. Impression:               - Benign-appearing esophageal stenosis. Dilated to                            49mm with TTS balloon dilator.                           - Medium-sized hiatal hernia.                           - The examination was otherwise normal.                           - No specimens collected. Moderate Sedation:      N/A- Per Anesthesia Care Recommendation:           - Patient has a contact number available for                            emergencies. The signs and symptoms of potential                             delayed complications were discussed with the                            patient. Return to normal activities tomorrow.                            Written discharge instructions were provided to the                            patient.                           - Resume previous diet.                           - Continue present medications, including twice                            daily PPI prilosec for another 4-6 weeks then it is                            OK to decrease to once daily prilosec (40mg  pill).                           -  Repeat upper endoscopy PRN for retreatment.                            Please call if your swallowing troubles return of                            if this dilation has not helped you eat without                            food hanging, catching.                           - Please see your PCP about the sore throat that                            has lasted 3-4 weeks, I do not think the esophageal                            issue explains your sore throat. Procedure Code(s):        --- Professional ---                           [phone], Esophagogastroduodenoscopy, flexible,                            transoral; with transendoscopic balloon dilation of                            esophagus (less than 30 mm diameter) Diagnosis Code(s):        --- Professional ---                           K22.2, Esophageal obstruction                           K44.9, Diaphragmatic hernia without obstruction or                            gangrene                           R13.10, Dysphagia, unspecified CPT copyright 2016 [employer]. All rights reserved. The codes documented in this report are preliminary and upon coder review may  be revised to meet current compliance requirements. Milus Banister, MD 11/24/2015 8:26:32 AM This report has been signed electronically. Number of Addenda: 0

## 2015-11-24 NOTE — H&P (View-Only) (Signed)
Referring Provider: Quincy Carnes, Hershal Coria, ED Primary Care Physician:  [name], MD Primary Gastroenterologist:  Dr. Ardis Hughs  Reason for Consultation:  Food impaction  HPI: Fernando Moyer is a 82 y.o. male with history of AAA, aortic stenosis status post valve replacement, hypertension, diabetes, COPD, chronic back pain, GERD, hyperlipidemia, paroxysmal A. fib on Eliquis, presenting to the ED with food impaction. Patient has a history of esophageal strictures requiring dilatation in the past.  Last had EGD in 03/2015. States yesterday he was trying to eat a steak sandwich from [employer] but continued having the sensation that there was something stuck in the bottom of his throat. He reported that he has not been able to eat or drink anything since this time.  He has been having any difficulty handling his secretions.  No fever, chills, sweats.  Denies any abdominal pain currently.  VSS.  Last took Eliquis yesterday morning as he has not been able to swallow since dinner yesterday.  EGD with Dr. Ardis Hughs 03/2015--  Endoscopic Impression: There was a large hiatal hernia (5-6cm). There was a thick Schatzki's ring vs. focal peptic stricture with lumen 91mm, this was dilated with a TTS balloon held inflated to 32mm for 1 minute. There was typical superficial mucosal tear and self limited oozing of blood following dilation. The examination was otherwise normal. RECOMMENDATIONS: Continue PPI twice daily. Continue to chew your food well, eat slowly and take small bites. Please call if the swallowing difficulty returns.   Past Medical History:  Diagnosis Date  . AAA (abdominal aortic aneurysm) ([place]) 05/2008   PER CARDIAC CATH  . Aortic stenosis 05/2008   PER CARDIAC CATH, MILD, MITRAL STENOSIS  . Back pain    cause unknown but does get Cortisone injection every now and then  . Benign neoplasm of colon   . Blood transfusion without reported diagnosis   . Bowen's disease    right arm BX: referred to  dermatology  . Carotid artery occlusion    last u/s 3-11...Marland Kitchenper vasc.surgery 2007 cath: nonobstructive CAD. mil;d MS, trivial AoS, small AAA,  . Cataract   . Chronic back pain   . COPD (chronic obstructive pulmonary disease) ([place])   . Coronary artery disease 05/2008   MILD, MEDICAL MANAGEMENT  . Diabetes mellitus ([place]) 09/07/2011   no per pt- no medications  . Dizziness    takes Meclizine daily  . Dyspnea     w/u included a cath and PFT's, sxms thought to be pulmonary  . GERD with stricture    w/hx of stricture  . Hyperlipidemia    takes Pravastatin daily  . Mitral stenosis 05/2008   PER CARDIAC CATH  . Myocardial infarction ([place]) 2012  . Peripheral neuropathy ([facility])    right thigh; "it's been that way for years"  . Pulmonary hypertension ([place])   . PVD (peripheral vascular disease) ([place]) 12/07   per cath 12/07....iliac  . Stricture and stenosis of esophagus     Past Surgical History:  Procedure Laterality Date  . AORTIC VALVE REPLACEMENT  08/17/2011   Procedure: AORTIC VALVE REPLACEMENT (AVR);  Surgeon: Gaye Pollack, MD;  Location: [facility];  Service: Open Heart Surgery;  Laterality: N/A;  . APPENDECTOMY    . CARDIAC CATHETERIZATION  02/2011   LAD 50, CFX OK, RCA40, EF 55%; PCWP 17, AoV  0.9 cm squared  . COLONOSCOPY    . ENDARTERECTOMY  08-2010   Right carotid endarterectomy    . ESOPHAGEAL DILATION    .  eye lid surgery Bilateral ~ 07-2015  . EYE SURGERY  10/14/13   cataract surgery L eye   . LEFT AND RIGHT HEART CATHETERIZATION WITH CORONARY ANGIOGRAM N/A 02/23/2011   Procedure: LEFT AND RIGHT HEART CATHETERIZATION WITH CORONARY ANGIOGRAM;  Surgeon: [name], MD;  Location: [facility] CATH LAB;  Service: Cardiovascular;  Laterality: N/A;  . STERNAL WIRES REMOVAL N/A 05/01/2013   Procedure: STERNAL WIRES REMOVAL;  Surgeon: Gaye Pollack, MD;  Location: [facility] OR;  Service: Thoracic;  Laterality: N/A;    Prior to Admission medications   Medication Sig Start Date End Date Taking?  Authorizing Provider  acetaminophen (TYLENOL) 500 MG tablet Take 1,000 mg by mouth daily as needed (pain). Reported on 04/21/2015    Historical Provider, MD  ELIQUIS 5 MG TABS tablet TAKE 1 TABLET TWICE A DAY 08/19/15   [name], MD  furosemide (LASIX) 40 MG tablet TAKE 1 TABLET DAILY 08/19/15   [name], MD  KLOR-CON M20 20 MEQ tablet TAKE 1 TABLET DAILY 08/19/15   [name], MD  meclizine (ANTIVERT) 25 MG tablet TAKE 1 TABLET 3 TIMES A DAY AS NEEDED FOR DIZZINESS    Colon Branch, MD  omeprazole (PRILOSEC) 40 MG capsule Take 40 mg by mouth 2 (two) times daily.    Historical Provider, MD  pravastatin (PRAVACHOL) 40 MG tablet Take 1 tablet (40 mg total) by mouth daily. 09/09/15   Colon Branch, MD  tiotropium (SPIRIVA HANDIHALER) 18 MCG inhalation capsule Place 1 capsule (18 mcg total) into inhaler and inhale daily. 05/23/15   Colon Branch, MD  traMADol (ULTRAM) 50 MG tablet Take 1 tablet (50 mg total) by mouth every 12 (twelve) hours as needed. for pain 07/19/15   Colon Branch, MD    No current facility-administered medications for this encounter.    Current Outpatient Prescriptions  Medication Sig Dispense Refill  . acetaminophen (TYLENOL) 500 MG tablet Take 1,000 mg by mouth daily as needed (pain). Reported on 04/21/2015    . ELIQUIS 5 MG TABS tablet TAKE 1 TABLET TWICE A DAY 180 tablet 1  . furosemide (LASIX) 40 MG tablet TAKE 1 TABLET DAILY 90 tablet 1  . KLOR-CON M20 20 MEQ tablet TAKE 1 TABLET DAILY 90 tablet 1  . meclizine (ANTIVERT) 25 MG tablet TAKE 1 TABLET 3 TIMES A DAY AS NEEDED FOR DIZZINESS 30 tablet 1  . omeprazole (PRILOSEC) 40 MG capsule Take 40 mg by mouth 2 (two) times daily.    . pravastatin (PRAVACHOL) 40 MG tablet Take 1 tablet (40 mg total) by mouth daily. 90 tablet 2  . tiotropium (SPIRIVA HANDIHALER) 18 MCG inhalation capsule Place 1 capsule (18 mcg total) into inhaler and inhale daily. 90 capsule 2  . traMADol (ULTRAM) 50 MG tablet Take 1 tablet (50 mg total) by mouth  every 12 (twelve) hours as needed. for pain 60 tablet 0    Allergies as of 10/29/2015  . (No Known Allergies)    Family History  Problem Relation Age of Onset  . Heart attack Father 19    MI  . Prostate cancer Brother 17  . Diabetes Neg Hx   . Colon cancer Neg Hx   . Anesthesia problems Neg Hx   . Hypotension Neg Hx   . Malignant hyperthermia Neg Hx   . Pseudochol deficiency Neg Hx   . Stomach cancer Neg Hx   . Esophageal cancer Neg Hx   . Rectal cancer Neg Hx     Social  History   Social History  . Marital status: Widowed    Spouse name: N/A  . Number of children: 2  . Years of education: N/A   Occupational History  . 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 Smoking status: Former Smoker    Packs/day: 2.00    Years: 30.00    Types: Cigarettes  . Smokeless tobacco: Never Used     Comment: quit smoking in 1989  . Alcohol use No  . Drug use: No  . Sexual activity: Not on file   Other Topics Concern  . Not on file   Social History Narrative   LIVES BY HIMSELF    HAS 2 GROWN SONS, 5 G-KIDS (Melissa usually comes with him for his appointments)           Review of Systems: Ten point ROS is O/W negative except as mentioned in HPI.  Physical Exam: Vital signs in last 24 hours: Pulse Rate:  [100-104] 100 (07/29 1100) BP: (154-186)/(65-88) 186/73 (07/29 1100) SpO2:  [92 %-93 %] 92 % (07/29 1100)   Gen: awake, alert, NAD HEENT: anicteric, op clear CV: irreg, mild tachycardia Pulm: CTA b/l Abd: soft, NT/ND, +BS throughout Ext: no c/c/e Neuro: nonfocal   Lab Results:  Recent Labs  10/29/15 0853  WBC 7.3  HGB 13.7  HCT 41.2  PLT 213   BMET  Recent Labs  10/29/15 0853  NA 140  K 4.0  CL 109  CO2 25  GLUCOSE 129*  BUN 11  CREATININE 0.99  CALCIUM 9.6   Studies/Results: Dg Chest 2 View  Result Date: 10/29/2015 CLINICAL DATA:  Food impaction EXAM:  CHEST  2 VIEW COMPARISON:  05/13/2013 FINDINGS: Mild cardiomegaly. Mild peribronchial thickening and interstitial prominence. Bibasilar opacities could reflect atelectasis or scarring. No effusions or acute bony abnormality. IMPRESSION: Suspect mild bronchitic changes.  Bibasilar atelectasis or scarring. Electronically Signed   By: Rolm Baptise M.D.   On: 10/29/2015 09:29  IMPRESSION:  -82 year old male with history of esophageal stricture and food impaction.  Suspected to have a food impaction currently after eating a steak sandwich at [employer] yesterday.  PLAN: -EGD today in ED with removal of suspected food bolus.  Will not be able to dilate given recent impaction and the use of Eliquis.  Will arrange followup with Dr. Ardis Hughs for repeat dilation once Eliquis can be held appropriately with permission from Dr. Burt Knack (cardiology). -The nature of the procedure, as well as the risks, benefits, and alternatives were carefully and thoroughly reviewed with the patient. Ample time for discussion and questions allowed. The patient understood, was satisfied, and agreed to proceed.      ZEHR, JESSICA D.  10/29/2015, 11:20 AM  Pager number [phone]  Lajuan Lines. Pyrtle, M.D.  10/29/2015

## 2015-11-25 ENCOUNTER — Encounter (HOSPITAL_COMMUNITY): Payer: Self-pay | Admitting: Gastroenterology

## 2015-12-03 DIAGNOSIS — M5136 Other intervertebral disc degeneration, lumbar region: Secondary | ICD-10-CM | POA: Diagnosis not present

## 2015-12-03 DIAGNOSIS — G894 Chronic pain syndrome: Secondary | ICD-10-CM | POA: Diagnosis not present

## 2015-12-05 ENCOUNTER — Encounter: Payer: Self-pay | Admitting: Gastroenterology

## 2015-12-06 ENCOUNTER — Telehealth: Payer: Self-pay | Admitting: Internal Medicine

## 2015-12-06 NOTE — Telephone Encounter (Signed)
[facility]  Patient Name: Fernando Moyer  DOB: Feb 12, 1934    Initial Comment Caller states the caller wants to schedule her grandfather an appointment to get his throat checked out. He was seen in the ER within the last month for an inability to swallow. Ever since he was seen in the ER his throat has bothered him. After speaking for a couple of minutes even, his throat will hurt so bad that he cannot talk any further.    Nurse Assessment  Nurse: Harlow Mares, RN, Suanne Marker Date/Time (Eastern Time): 12/06/2015 4:50:43 PM  Confirm and document reason for call. If symptomatic, describe symptoms. You must click the next button to save text entered. ---Caller states the caller wants to schedule her grandfather an appointment to get his throat checked out. He was seen in the ER within the last month for an inability to swallow. Ever since he was seen in the ER his throat has bothered him. After speaking for a couple of minutes even, his throat will hurt so bad that he cannot talk any further.  Has the patient traveled out of the country within the last 30 days? ---Not Applicable  Does the patient have any new or worsening symptoms? ---Yes  Will a triage be completed? ---Yes  Related visit to physician within the last 2 weeks? ---Yes  Does the PT have any chronic conditions? (i.e. diabetes, asthma, etc.) ---Yes  List chronic conditions. ---[place];  Is this a behavioral health or substance abuse call? ---No     Guidelines    Guideline Title Affirmed Question Affirmed Notes  Sore Throat SEVERE (e.g., excruciating) throat pain    Final Disposition User   See Physician within [place], RN, Suanne Marker    Comments  Reports that patient has had his esophagus stretched a few times but continues to have a sore throat with hoarseness. Appt scheduled for tomorrow 12/07/15 @ 11:15am with Evern Core, PA at the [place] office. Caller voiced understanding.   Referrals   REFERRED TO PCP OFFICE   Disagree/Comply: Comply

## 2015-12-07 ENCOUNTER — Ambulatory Visit (INDEPENDENT_AMBULATORY_CARE_PROVIDER_SITE_OTHER): Payer: Medicare Other | Admitting: Medical

## 2015-12-07 ENCOUNTER — Encounter: Payer: Self-pay | Admitting: Medical

## 2015-12-07 VITALS — BP 140/58 | HR 80 | Temp 98.2°F | Ht 67.0 in | Wt 211.4 lb

## 2015-12-07 DIAGNOSIS — J020 Streptococcal pharyngitis: Secondary | ICD-10-CM

## 2015-12-07 DIAGNOSIS — I6523 Occlusion and stenosis of bilateral carotid arteries: Secondary | ICD-10-CM | POA: Diagnosis not present

## 2015-12-07 DIAGNOSIS — J029 Acute pharyngitis, unspecified: Secondary | ICD-10-CM

## 2015-12-07 LAB — POCT RAPID STREP A (OFFICE): RAPID STREP A SCREEN: POSITIVE — AB

## 2015-12-07 MED ORDER — AMOXICILLIN 875 MG PO TABS: 875.0000 mg | ORAL_TABLET | Freq: Two times a day (BID) | ORAL | 0 refills | Status: DC

## 2015-12-07 MED FILL — AMOXICILLIN 875 MG TABLET: 875 | 10 days supply | Qty: 20 | Fill #0

## 2015-12-07 NOTE — Patient Instructions (Addendum)
Your strep test was positive. I am prescribing amoxicillin  antibiotic. Rest hydrate, tylenol for fever and warm salt water gargles. Follow up in 7 days or as needed.  I don't think recent symptoms related to our esopohageal stenosis or dilation. If after above treatment if symptoms persist please let us know.

## 2015-12-07 NOTE — Progress Notes (Signed)
   Subjective:    Patient ID: Fernando Moyer, male    DOB: 06/10/1933, 82 y.o.   MRN: [id number]  HPI    He states over past 5-7 days pain in throat was moderate to severe. With this no reported fever, chills, sweats or bodyaches.   Pt has history of dysphagia(more so over past month). He was having difficulty swallowing and needed 2 dilations over past month  Per grand daughter. Dilated last  on 24 th. Family called his GI MD and they stated not likely  problem from dilation. But be seen by pcp.     Review of Systems  Constitutional: Negative for chills, fatigue and fever.  HENT: Positive for sore throat. Negative for congestion, nosebleeds, rhinorrhea and sinus pressure.   Respiratory: Negative for cough, chest tightness, shortness of breath and wheezing.   Cardiovascular: Negative for chest pain and palpitations.  Gastrointestinal: Negative for abdominal pain and blood in stool.  Musculoskeletal: Negative for back pain and myalgias.  Neurological: Negative for dizziness, speech difficulty, light-headedness and numbness.  Hematological: Positive for adenopathy. Does not bruise/bleed easily.  Psychiatric/Behavioral: Negative for behavioral problems and confusion.       Objective:   Physical Exam  General  Mental Status - Alert. General Appearance - Well groomed. Not in acute distress.  Skin Rashes- No Rashes.  HEENT Head- Normal. Ear Auditory Canal - Left- Normal. Right - Normal.Tympanic Membrane- Left- Normal. Right- Normal. Eye Sclera/Conjunctiva- Left- Normal. Right- Normal. Nose & Sinuses Nasal Mucosa- Left-  Not Boggy and Congested. Right-  Not Boggy and  Congested.Bilateral  No maxillary and no  frontal sinus pressure. Mouth & Throat Lips: Upper Lip- Normal: no dryness, cracking, pallor, cyanosis, or vesicular eruption. Lower Lip-Normal: no dryness, cracking, pallor, cyanosis or vesicular eruption. Buccal Mucosa- Bilateral- No Aphthous ulcers. Oropharynx- No  Discharge or Erythema. Tonsils: Characteristics- Bilateral-  Erythema and Congestion. Size/Enlargement- Bilateral- 1+enlargement. Discharge- bilateral-None.  Neck Neck- Supple. No Masses. Mild shoddy tender submandibular nodes.   Chest and Lung Exam Auscultation: Breath Sounds:-Clear even and unlabored.  Cardiovascular Auscultation:Rythm- Regular, rate and rhythm. Murmurs & Other Heart Sounds:Ausculatation of the heart reveal- No Murmurs.  Lymphatic Head & Neck General Head & Neck Lymphatics: Bilateral: Description- No Localized lymphadenopathy.       Assessment & Plan:  Your strep test was positive. I am prescribing amoxicillin  antibiotic. Rest hydrate, tylenol for fever and warm salt water gargles. Follow up in 7 days or as needed.  I don't think recent symptoms related to our esopohageal stenosis or dilation.. If after above treatment if symptoms persist please let us know.

## 2015-12-07 NOTE — Progress Notes (Signed)
Pre visit review using our clinic tool,if applicable. No additional management support is needed unless otherwise documented below in the visit note.  

## 2015-12-08 ENCOUNTER — Ambulatory Visit: Payer: Medicare Other | Attending: Physical Medicine and Rehabilitation | Admitting: Physical Therapy

## 2015-12-08 ENCOUNTER — Encounter: Payer: Self-pay | Admitting: Physical Therapy

## 2015-12-08 DIAGNOSIS — M5441 Lumbago with sciatica, right side: Secondary | ICD-10-CM | POA: Insufficient documentation

## 2015-12-08 DIAGNOSIS — R262 Difficulty in walking, not elsewhere classified: Secondary | ICD-10-CM | POA: Diagnosis not present

## 2015-12-08 DIAGNOSIS — M6281 Muscle weakness (generalized): Secondary | ICD-10-CM

## 2015-12-08 DIAGNOSIS — M5442 Lumbago with sciatica, left side: Secondary | ICD-10-CM | POA: Diagnosis not present

## 2015-12-08 NOTE — Therapy (Signed)
[place] [address] [place], [place], [address] Phone: [phone]   Fax:  [phone]  Physical Therapy Evaluation  Patient Details  Name: Fernando Moyer MRN: [id number] Date of Birth: September 18, 1933 Referring Provider: Nelva Bush  Encounter Date: 12/08/2015      PT End of Session - 12/08/15 0857    Visit Number 1   Date for PT Re-Evaluation 02/07/16   PT Start Time 0835   PT Stop Time 0920   PT Time Calculation (min) 45 min   Activity Tolerance Patient tolerated treatment well   Behavior During Therapy [facility] for tasks assessed/performed      Past Medical History:  Diagnosis Date  . AAA (abdominal aortic aneurysm) ([place]) 05/2008   PER CARDIAC CATH 3.5 X 3. 5 CM  . Aortic stenosis 05/2008   PER CARDIAC CATH, MILD, MITRAL STENOSIS  . Back pain    cause unknown but does get Cortisone injection every now and then  . Benign neoplasm of colon   . Bowen's disease    right arm BX: referred to dermatology  . Carotid artery occlusion    last u/s 3-11...Marland Kitchenper vasc.surgery 2007 cath: nonobstructive CAD. mil;d MS, trivial AoS, small AAA,  . Cataract    RIGHT  . Chronic back pain   . COPD (chronic obstructive pulmonary disease) ([place])   . Coronary artery disease 05/2008   MILD, MEDICAL MANAGEMENT  . Diabetes mellitus ([place]) 09/07/2011   no per pt- no medications  . Dyspnea    with exertion only  . GERD with stricture    w/hx of stricture  . Hyperlipidemia    takes Pravastatin daily  . Mitral stenosis 05/2008   PER CARDIAC CATH  . Myocardial infarction ([place]) 2012  . Peripheral neuropathy (HCC)    right thigh; "it's been that way for years"  . Pulmonary hypertension ([place])   . PVD (peripheral vascular disease) ([place]) 12/07   per cath 12/07....iliac  . Stricture and stenosis of esophagus     Past Surgical History:  Procedure Laterality Date  . AORTIC VALVE REPLACEMENT  08/17/2011   Procedure: AORTIC VALVE REPLACEMENT (AVR);   Surgeon: Gaye Pollack, MD;  Location: [place];  Service: Open Heart Surgery;  Laterality: N/A;  . APPENDECTOMY    . CARDIAC CATHETERIZATION  02/2011   LAD 50, CFX OK, RCA40, EF 55%; PCWP 17, AoV  0.9 cm squared  . COLONOSCOPY    . ENDARTERECTOMY  08-2010   Right carotid endarterectomy    . ESOPHAGEAL DILATION    . ESOPHAGOGASTRODUODENOSCOPY N/A 10/29/2015   Procedure: ESOPHAGOGASTRODUODENOSCOPY (EGD);  Surgeon: Jerene Bears, MD;  Location: [facility] ENDOSCOPY;  Service: Endoscopy;  Laterality: N/A;  . ESOPHAGOGASTRODUODENOSCOPY (EGD) WITH PROPOFOL N/A 11/10/2015   Procedure: ESOPHAGOGASTRODUODENOSCOPY (EGD) WITH PROPOFOL;  Surgeon: Milus Banister, MD;  Location: [facility] ENDOSCOPY;  Service: Endoscopy;  Laterality: N/A;  . ESOPHAGOGASTRODUODENOSCOPY (EGD) WITH PROPOFOL N/A 11/24/2015   Procedure: ESOPHAGOGASTRODUODENOSCOPY (EGD) WITH PROPOFOL;  Surgeon: Milus Banister, MD;  Location: [facility] ENDOSCOPY;  Service: Endoscopy;  Laterality: N/A;  dil  . eye lid surgery Bilateral ~ 07-2015  . EYE SURGERY  10/14/13   cataract surgery LEFT eye   . LEFT AND RIGHT HEART CATHETERIZATION WITH CORONARY ANGIOGRAM N/A 02/23/2011   Procedure: LEFT AND RIGHT HEART CATHETERIZATION WITH CORONARY ANGIOGRAM;  Surgeon: [name], MD;  [facility] C[facility] LAB;  Service: Cardiovascular;  Laterality: N/A;  . STERNAL WIRES REMOVAL N/A 05/01/2013   Procedure:  STERNAL WIRES REMOVAL;  Surgeon: Gaye Pollack, MD;  Location: [facility] OR;  Service: Thoracic;  Laterality: N/A;    There were no vitals filed for this visit.       Subjective Assessment - 12/08/15 0834    Subjective Patient reports that he has had LBP for a number of years, he reports that he has had multiple injections, reports nothing has really helped.  Reports x-rays show DDD and arthritis   How long can you sit comfortably? 15 minutes   How long can you stand comfortably? 5 minutes   How long can you walk comfortably? 100 feet   Patient Stated Goals have less pain    Currently in Pain? Yes   Pain Score 6    Pain Location Back   Pain Orientation Lower   Pain Descriptors / Indicators Aching;Sharp   Pain Type Chronic pain   Pain Onset More than a month ago   Pain Frequency Constant   Aggravating Factors  activity will increase pain to 9/10   Pain Relieving Factors nothing helps, a 5-6/10 is as good as it gets   Effect of Pain on Daily Activities limits everything            [facility] PT Assessment - 12/08/15 0001      Assessment   Medical Diagnosis LBP   Referring Provider Ramos   Onset Date/Surgical Date 11/07/15   Prior Therapy no     Precautions   Precautions None     Balance Screen   Has the patient fallen in the past 6 months No   Has the patient had a decrease in activity level because of a fear of falling?  No   Is the patient reluctant to leave their home because of a fear of falling?  No     Home Environment   Additional Comments lives alone, does his own housework     Prior Function   Level of Independence Independent   Vocation Part time employment   Vocation Requirements some driving   Leisure tries to do some exercises at home     Posture/Postural Control   Posture Comments fwd head, decreased lordosis     ROM / Strength   AROM / PROM / Strength AROM;Strength     AROM   Overall AROM Comments Lumbar ROM was decreased 75% for flexion and side bending, decreased 100% for extension with pain for all motions     Strength   Overall Strength Comments Right hip 3+/5, all other strength of the LE's was 4-/5 with some pain in the low back     Flexibility   Soft Tissue Assessment /Muscle Length --  very tight HS, calves and pirifomris     Palpation   Palpation comment he is very tender in the lumbar area and into the buttock., some tightness in the lumbar paraspinals     Special Tests    Special Tests --  pain with SLR and slump testing                   [facility] Adult PT Treatment/Exercise - 12/08/15 0001       Modalities   Modalities Moist Heat;Electrical Stimulation     Moist Heat Therapy   Number Minutes Moist Heat 15 Minutes   Moist Heat Location Lumbar Spine     Electrical Stimulation   Electrical Stimulation Location lumbar area   Electrical Stimulation Action IFC   Electrical Stimulation Parameters supine   Electrical Stimulation  Goals Pain                PT Education - 12/08/15 0857    Education provided Yes   Education Details Wms Flexion exercises   Person(s) Educated Patient   Methods Explanation;Handout   Comprehension Verbalized understanding          PT Short Term Goals - 12/08/15 0902      PT SHORT TERM GOAL #1   Title independent with HEP   Time 2   Period Weeks   Status New           PT Long Term Goals - 12/08/15 [id number]      PT LONG TERM GOAL #1   Title understand posture and body mechanics   Time 8   Period Weeks   Status New     PT LONG TERM GOAL #2   Title decrease pain 25%   Time 8   Period Weeks   Status New     PT LONG TERM GOAL #3   Title increase lumbar ROM 25%   Time 8   Period Weeks   Status New     PT LONG TERM GOAL #4   Title walk 400 feet without rest   Time 8   Period Weeks   Status New               Plan - 12/08/15 [id number]    Clinical Impression Statement Patient with LBP for many years.  Reports that he has not had any therapy in the past, has tried some exercises, very tight in the LE's.  Limited in all ADL's   Rehab Potential Good   PT Frequency 2x / week   PT Duration 8 weeks   PT Treatment/Interventions ADLs/Self Care Home Management;Cryotherapy;Electrical Stimulation;Moist Heat;Traction;Ultrasound;Functional mobility training;Therapeutic exercise;Therapeutic activities;Manual techniques   PT Next Visit Plan slowly try to add exercises   Consulted and Agree with Plan of Care Patient      Patient will benefit from skilled therapeutic intervention in order to improve the following deficits and  impairments:     Visit Diagnosis: Bilateral low back pain with sciatica, sciatica laterality unspecified - Plan: PT plan of care cert/re-cert  Difficulty in walking, not elsewhere classified - Plan: PT plan of care cert/re-cert  Muscle weakness (generalized) - Plan: PT plan of care cert/re-cert      G-Codes - 0000000 0912    Functional Assessment Tool Used foto 75% limitation   Functional Limitation Other PT primary   Other PT Primary Current Status [id number]) At least 60 percent but less than 80 percent impaired, limited or restricted   Other PT Primary Goal Status [id number]) At least 40 percent but less than 60 percent impaired, limited or restricted       Problem List Patient Active Problem List   Diagnosis Date Noted  . Benign esophageal stricture 11/24/2015  . Hiatal hernia 11/24/2015  . Esophageal obstruction due to food impaction   . PCP NOTES >>>>>>>>>>>>>>>>>> 08/23/2015  . Back pain 10/19/2014  . Carotid stenosis 04/14/2014  . Toe infection 03/15/2014  . Dyspnea 02/01/2014  . Skin cancer 09/29/2013  . Subclavian artery stenosis ([place]) 04/08/2013  . Annual physical exam 02/25/2013  . Dysphagia 02/12/2013  . History of esophageal stricture 02/12/2013  . Chest pain 12/26/2012  . Atypical chest pain 11/21/2012  . S/P AVR (aortic valve replacement) 11/21/2012  . PAF (paroxysmal atrial fibrillation) ([place]) 11/21/2012  . Dizziness 09/28/2011  . Diabetes mellitus ([place]) 09/07/2011  .  Atrial flutter with rapid ventricular response ([place]) 02/22/2011  . BPH , Microscopic hematuria 01/17/2011  . Rosacea 01/17/2011  . CAROTID ARTERY DISEASE 06/24/2009  . DIZZINESS 04/04/2009  . Headache(784.0) 04/04/2009  . EMPHYSEMA 07/12/2008  . Mitral stenosis 06/29/2008  . CAD (coronary artery disease) 06/29/2008  . HYPERTENSION, PULMONARY 06/29/2008  . AORTIC STENOSIS, moderately severe 06/29/2008  . ABDOMINAL AORTIC ANEURYSM 06/29/2008  . Hyperlipidemia 04/14/2008  . Peripheral  vascular disease ([place]) 04/14/2006  . GERD, Stricture per EGD 11 2014 04/14/2006    Sumner Boast., PT 12/08/2015, 9:19 AM  [address] [place], [place], [address] Phone: [phone]   Fax:  [phone]  Name: Fernando Moyer MRN: [id number] Date of Birth: 1933-08-14

## 2015-12-12 ENCOUNTER — Ambulatory Visit: Payer: Medicare Other | Admitting: Physical Therapy

## 2015-12-12 ENCOUNTER — Encounter: Payer: Self-pay | Admitting: Physical Therapy

## 2015-12-12 DIAGNOSIS — M5442 Lumbago with sciatica, left side: Secondary | ICD-10-CM | POA: Diagnosis not present

## 2015-12-12 DIAGNOSIS — M5441 Lumbago with sciatica, right side: Secondary | ICD-10-CM

## 2015-12-12 DIAGNOSIS — M6281 Muscle weakness (generalized): Secondary | ICD-10-CM | POA: Diagnosis not present

## 2015-12-12 DIAGNOSIS — R262 Difficulty in walking, not elsewhere classified: Secondary | ICD-10-CM

## 2015-12-12 NOTE — Therapy (Signed)
[place], [place], [address] Phone: [phone]   Fax:  [phone]  Physical Therapy Treatment  Patient Details  Name: Fernando Moyer MRN: [id number] Date of Birth: [birth year] Referring Provider: Nelva Bush  Encounter Date: 12/12/2015      PT End of Session - 12/12/15 0913    Visit Number 2   Date for PT Re-Evaluation 02/07/16   PT Start Time 0837   PT Stop Time 0925   PT Time Calculation (min) 48 min   Activity Tolerance Patient tolerated treatment well   Behavior During Therapy [facility] for tasks assessed/performed      Past Medical History:  Diagnosis Date  . AAA (abdominal aortic aneurysm) ([place]) 05/2008   PER CARDIAC CATH 3.5 X 3. 5 CM  . Aortic stenosis 05/2008   PER CARDIAC CATH, MILD, MITRAL STENOSIS  . Back pain    cause unknown but does get Cortisone injection every now and then  . Benign neoplasm of colon   . Bowen's disease    right arm BX: referred to dermatology  . Carotid artery occlusion    last u/s 3-11...Marland Kitchenper vasc.surgery 2007 cath: nonobstructive CAD. mil;d MS, trivial AoS, small AAA,  . Cataract    RIGHT  . Chronic back pain   . COPD (chronic obstructive pulmonary disease) ([place])   . Coronary artery disease 05/2008   MILD, MEDICAL MANAGEMENT  . Diabetes mellitus ([place]) 09/07/2011   no per pt- no medications  . Dyspnea    with exertion only  . GERD with stricture    w/hx of stricture  . Hyperlipidemia    takes Pravastatin daily  . Mitral stenosis 05/2008   PER CARDIAC CATH  . Myocardial infarction ([place]) 2012  . Peripheral neuropathy (HCC)    right thigh; "it's been that way for years"  . Pulmonary hypertension ([place])   . PVD (peripheral vascular disease) ([place]) 12/07   per cath 12/07....iliac  . Stricture and stenosis of esophagus     Past Surgical History:  Procedure Laterality Date  . AORTIC VALVE REPLACEMENT  08/17/2011   Procedure: AORTIC VALVE REPLACEMENT (AVR);   Surgeon: Gaye Pollack, MD;  Location: [place];  Service: Open Heart Surgery;  Laterality: N/A;  . APPENDECTOMY    . CARDIAC CATHETERIZATION  02/2011   LAD 50, CFX OK, RCA40, EF 55%; PCWP 17, AoV  0.9 cm squared  . COLONOSCOPY    . ENDARTERECTOMY  08-2010   Right carotid endarterectomy    . ESOPHAGEAL DILATION    . ESOPHAGOGASTRODUODENOSCOPY N/A 10/29/2015   Procedure: ESOPHAGOGASTRODUODENOSCOPY (EGD);  Surgeon: Jerene Bears, MD;  Location: [facility] ENDOSCOPY;  Service: Endoscopy;  Laterality: N/A;  . ESOPHAGOGASTRODUODENOSCOPY (EGD) WITH PROPOFOL N/A 11/10/2015   Procedure: ESOPHAGOGASTRODUODENOSCOPY (EGD) WITH PROPOFOL;  Surgeon: Milus Banister, MD;  [facility] ENDOSCOPY;  Service: Endoscopy;  Laterality: N/A;  . ESOPHAGOGASTRODUODENOSCOPY (EGD) WITH PROPOFOL N/A 11/24/2015   Procedure: ESOPHAGOGASTRODUODENOSCOPY (EGD) WITH PROPOFOL;  Surgeon: Milus Banister, MD;  Location: [facility] ENDOSCOPY;  Service: Endoscopy;  Laterality: N/A;  dil  . eye lid surgery Bilateral ~ 07-2015  . EYE SURGERY  10/14/13   cataract surgery LEFT eye   . LEFT AND RIGHT HEART CATHETERIZATION WITH CORONARY ANGIOGRAM N/A 02/23/2011   Procedure: LEFT AND RIGHT HEART CATHETERIZATION WITH CORONARY ANGIOGRAM;  Surgeon: [name], MD;  Location: [facility] CATH LAB;  Service: Cardiovascular;  Laterality: N/A;  . STERNAL WIRES REMOVAL N/A 05/01/2013   Procedure:  STERNAL WIRES REMOVAL;  Surgeon: Gaye Pollack, MD;  Location: [facility] OR;  Service: Thoracic;  Laterality: N/A;    There were no vitals filed for this visit.      Subjective Assessment - 12/12/15 0838    Subjective "Pretty good, Are they going to do that electric thing today, it has helped more than anything else"   Currently in Pain? Yes   Pain Score 4    Pain Location Back   Pain Orientation Mid                         [facility] Adult PT Treatment/Exercise - 12/12/15 0001      Exercises   Exercises Lumbar     Lumbar Exercises: Aerobic   Tread Mill NuStep  L4 x6 min      Lumbar Exercises: Machines for Strengthening   Cybex Knee Extension 10lb 2x10   Cybex Knee Flexion 20lb 2x10   Leg Press 20lb 3 x10    Other Lumbar Machine Exercise Row, Lats 25lb 2x10     Lumbar Exercises: Standing   Other Standing Lumbar Exercises Standing straight arm pull downs 35lb 2x15      Modalities   Modalities Moist Heat;Electrical Stimulation     Moist Heat Therapy   Number Minutes Moist Heat 15 Minutes   Moist Heat Location Lumbar Spine     Electrical Stimulation   Electrical Stimulation Location lumbar area   Electrical Stimulation Action IFC   Electrical Stimulation Parameters Supine, to pt tolerance   Electrical Stimulation Goals Pain                  PT Short Term Goals - 12/08/15 0902      PT SHORT TERM GOAL #1   Title independent with HEP   Time 2   Period Weeks   Status New           PT Long Term Goals - 12/08/15 [id number]      PT LONG TERM GOAL #1   Title understand posture and body mechanics   Time 8   Period Weeks   Status New     PT LONG TERM GOAL #2   Title decrease pain 25%   Time 8   Period Weeks   Status New     PT LONG TERM GOAL #3   Title increase lumbar ROM 25%   Time 8   Period Weeks   Status New     PT LONG TERM GOAL #4   Title walk 400 feet without rest   Time 8   Period Weeks   Status New               Plan - 12/12/15 0913    Clinical Impression Statement Pt with a  progression to machine level interventions. Pt does require cues for pacing with all exercises attempting to finish them fast. Cues also provided to maintain appropriate form with standing core strengthening exercises. Pt does reports increase fatigue with today's activity. Pt ~ to be HOA at times.   Rehab Potential Good   PT Frequency 2x / week   PT Duration 8 weeks   PT Treatment/Interventions ADLs/Self Care Home Management;Cryotherapy;Electrical Stimulation;Moist Heat;Traction;Ultrasound;Functional mobility  training;Therapeutic exercise;Therapeutic activities;Manual techniques   PT Next Visit Plan slowly try to add exercises for core strengthening and functional endurance.      Patient will benefit from skilled therapeutic intervention in order to improve the following deficits and impairments:  Visit Diagnosis: Muscle weakness (generalized)  Difficulty in walking, not elsewhere classified  Bilateral low back pain with sciatica, sciatica laterality unspecified     Problem List Patient Active Problem List   Diagnosis Date Noted  . Benign esophageal stricture 11/24/2015  . Hiatal hernia 11/24/2015  . Esophageal obstruction due to food impaction   . PCP NOTES >>>>>>>>>>>>>>>>>> 08/23/2015  . Back pain 10/19/2014  . Carotid stenosis 04/14/2014  . Toe infection 03/15/2014  . Dyspnea 02/01/2014  . Skin cancer 09/29/2013  . Subclavian artery stenosis ([place]) 04/08/2013  . Annual physical exam 02/25/2013  . Dysphagia 02/12/2013  . History of esophageal stricture 02/12/2013  . Chest pain 12/26/2012  . Atypical chest pain 11/21/2012  . S/P AVR (aortic valve replacement) 11/21/2012  . PAF (paroxysmal atrial fibrillation) ([place]) 11/21/2012  . Dizziness 09/28/2011  . Diabetes mellitus ([place]) 09/07/2011  . Atrial flutter with rapid ventricular response ([place]) 02/22/2011  . BPH , Microscopic hematuria 01/17/2011  . Rosacea 01/17/2011  . CAROTID ARTERY DISEASE 06/24/2009  . DIZZINESS 04/04/2009  . Headache(784.0) 04/04/2009  . EMPHYSEMA 07/12/2008  . Mitral stenosis 06/29/2008  . CAD (coronary artery disease) 06/29/2008  . HYPERTENSION, PULMONARY 06/29/2008  . AORTIC STENOSIS, moderately severe 06/29/2008  . ABDOMINAL AORTIC ANEURYSM 06/29/2008  . Hyperlipidemia 04/14/2008  . Peripheral vascular disease ([place]) 04/14/2006  . GERD, Stricture per EGD 11 2014 04/14/2006    Scot Jun, PTA  12/12/2015, 9:17 AM  [place] [place], [place], [address] Phone: [phone]   Fax:  [phone]  Name: Fernando Moyer MRN: [id number] Date of Birth: [birth year]

## 2015-12-15 ENCOUNTER — Encounter: Payer: Self-pay | Admitting: Physical Therapy

## 2015-12-15 ENCOUNTER — Ambulatory Visit: Payer: Medicare Other | Admitting: Physical Therapy

## 2015-12-15 DIAGNOSIS — M5442 Lumbago with sciatica, left side: Secondary | ICD-10-CM | POA: Diagnosis not present

## 2015-12-15 DIAGNOSIS — M6281 Muscle weakness (generalized): Secondary | ICD-10-CM

## 2015-12-15 DIAGNOSIS — R262 Difficulty in walking, not elsewhere classified: Secondary | ICD-10-CM

## 2015-12-15 DIAGNOSIS — M5441 Lumbago with sciatica, right side: Secondary | ICD-10-CM

## 2015-12-15 NOTE — Therapy (Addendum)
[place] [address] [place], [place], [address] Phone: [phone]   Fax:  [phone]  Physical Therapy Treatment  Patient Details  Name: Fernando Moyer MRN: [id number] Date of Birth: [birth year] Referring Provider: Nelva Bush  Encounter Date: 12/15/2015      PT End of Session - 12/15/15 0925    Visit Number 3   Date for PT Re-Evaluation 02/07/16   PT Start Time 0842   PT Stop Time 0940   PT Time Calculation (min) 58 min   Activity Tolerance Patient limited by fatigue;Patient limited by pain   Behavior During Therapy [facility] for tasks assessed/performed      Past Medical History:  Diagnosis Date  . AAA (abdominal aortic aneurysm) ([place]) 05/2008   PER CARDIAC CATH 3.5 X 3. 5 CM  . Aortic stenosis 05/2008   PER CARDIAC CATH, MILD, MITRAL STENOSIS  . Back pain    cause unknown but does get Cortisone injection every now and then  . Benign neoplasm of colon   . Bowen's disease    right arm BX: referred to dermatology  . Carotid artery occlusion    last u/s 3-11...Marland Kitchenper vasc.surgery 2007 cath: nonobstructive CAD. mil;d MS, trivial AoS, small AAA,  . Cataract    RIGHT  . Chronic back pain   . COPD (chronic obstructive pulmonary disease) ([place])   . Coronary artery disease 05/2008   MILD, MEDICAL MANAGEMENT  . Diabetes mellitus ([place]) 09/07/2011   no per pt- no medications  . Dyspnea    with exertion only  . GERD with stricture    w/hx of stricture  . Hyperlipidemia    takes Pravastatin daily  . Mitral stenosis 05/2008   PER CARDIAC CATH  . Myocardial infarction ([place]) 2012  . Peripheral neuropathy (HCC)    right thigh; "it's been that way for years"  . Pulmonary hypertension ([place])   . PVD (peripheral vascular disease) ([place]) 12/07   per cath 12/07....iliac  . Stricture and stenosis of esophagus     Past Surgical History:  Procedure Laterality Date  . AORTIC VALVE REPLACEMENT  08/17/2011   Procedure: AORTIC VALVE  REPLACEMENT (AVR);  Surgeon: Gaye Pollack, MD;  Location: [place];  Service: Open Heart Surgery;  Laterality: N/A;  . APPENDECTOMY    . CARDIAC CATHETERIZATION  02/2011   LAD 50, CFX OK, RCA40, EF 55%; PCWP 17, AoV  0.9 cm squared  . COLONOSCOPY    . ENDARTERECTOMY  08-2010   Right carotid endarterectomy    . ESOPHAGEAL DILATION    . ESOPHAGOGASTRODUODENOSCOPY N/A 10/29/2015   Procedure: ESOPHAGOGASTRODUODENOSCOPY (EGD);  Surgeon: Jerene Bears, MD;  Location: [facility] ENDOSCOPY;  Service: Endoscopy;  Laterality: N/A;  . ESOPHAGOGASTRODUODENOSCOPY (EGD) WITH PROPOFOL N/A 11/10/2015   Procedure: ESOPHAGOGASTRODUODENOSCOPY (EGD) WITH PROPOFOL;  Surgeon: Milus Banister, MD;  [facility];  Service: Endoscopy;  Laterality: N/A;  . ESOPHAGOGASTRODUODENOSCOPY (EGD) WITH PROPOFOL N/A 11/24/2015   Procedure: ESOPHAGOGASTRODUODENOSCOPY (EGD) WITH PROPOFOL;  Surgeon: Milus Banister, MD;  [facility]OS[facility];  Service: Endoscopy;  Laterality: N/A;  dil  . eye lid surgery Bilateral ~ 07-2015  . EYE SURGERY  10/14/13   cataract surgery LEFT eye   . LEFT AND RIGHT HEART CATHETERIZATION WITH CORONARY ANGIOGRAM N/A 02/23/2011   Procedure: LEFT AND RIGHT HEART CATHETERIZATION WITH CORONARY ANGIOGRAM;  Surgeon: [name], MD;  Location: [facility] CATH LAB;  Service: Cardiovascular;  Laterality: N/A;  . STERNAL WIRES REMOVAL N/A 05/01/2013  Procedure: STERNAL WIRES REMOVAL;  Surgeon: Gaye Pollack, MD;  [facility];  Service: Thoracic;  Laterality: N/A;    There were no vitals filed for this visit.      Subjective Assessment - 12/15/15 0842    Subjective "A little slow today, that thing started yesterday afternoon"   Currently in Pain? Yes   Pain Score 7    Pain Location Back   Pain Orientation Mid                         [facility] Adult PT Treatment/Exercise - 12/15/15 0001      Lumbar Exercises: Stretches   Passive Hamstring Stretch 4 reps;10 seconds   Single Knee to Chest  Stretch 2 reps;10 seconds   Lower Trunk Rotation 2 reps;10 seconds   Piriformis Stretch 3 reps;10 seconds     Lumbar Exercises: Aerobic   Tread Mill NuStep L4 x6 min      Lumbar Exercises: Machines for Strengthening   Cybex Knee Extension 10lb 2x10   Cybex Knee Flexion 25lb 2x10   Leg Press 20lb 3 x10    Other Lumbar Machine Exercise Row, Lats 25lb 2x10     Modalities   Modalities Moist Heat;Electrical Stimulation     Moist Heat Therapy   Number Minutes Moist Heat 15 Minutes   Moist Heat Location Lumbar Spine     Electrical Stimulation   Electrical Stimulation Location lumbar area   Electrical Stimulation Action IFC   Electrical Stimulation Parameters supine to pt tolerance   Electrical Stimulation Goals Pain                  PT Short Term Goals - 12/08/15 0902      PT SHORT TERM GOAL #1   Title independent with HEP   Time 2   Period Weeks   Status New           PT Long Term Goals - 12/08/15 7124      PT LONG TERM GOAL #1   Title understand posture and body mechanics   Time 8   Period Weeks   Status New     PT LONG TERM GOAL #2   Title decrease pain 25%   Time 8   Period Weeks   Status New     PT LONG TERM GOAL #3   Title increase lumbar ROM 25%   Time 8   Period Weeks   Status New     PT LONG TERM GOAL #4   Title walk 400 feet without rest   Time 8   Period Weeks   Status New               Plan - 12/15/15 5809    Clinical Impression Statement Pt enters clinic reporting increase fatigue and low back pain that began yesterday. Pt required increase time to complete today's exercise. Pt slow to get on and off equipment. Pt with tight bilat HS R>L.   Rehab Potential Good   PT Frequency 2x / week   PT Duration 8 weeks   PT Treatment/Interventions ADLs/Self Care Home Management;Cryotherapy;Electrical Stimulation;Moist Heat;Traction;Ultrasound;Functional mobility training;Therapeutic exercise;Therapeutic activities;Manual techniques    PT Next Visit Plan slowly try to add exercises for core strengthening and functional endurance.      Patient will benefit from skilled therapeutic intervention in order to improve the following deficits and impairments:     Visit Diagnosis: Difficulty in walking, not elsewhere classified  Bilateral low back pain with sciatica, sciatica laterality unspecified  Muscle weakness (generalized)     Problem List Patient Active Problem List   Diagnosis Date Noted  . Benign esophageal stricture 11/24/2015  . Hiatal hernia 11/24/2015  . Esophageal obstruction due to food impaction   . PCP NOTES >>>>>>>>>>>>>>>>>> 08/23/2015  . Back pain 10/19/2014  . Carotid stenosis 04/14/2014  . Toe infection 03/15/2014  . Dyspnea 02/01/2014  . Skin cancer 09/29/2013  . Subclavian artery stenosis ([place]) 04/08/2013  . Annual physical exam 02/25/2013  . Dysphagia 02/12/2013  . History of esophageal stricture 02/12/2013  . Chest pain 12/26/2012  . Atypical chest pain 11/21/2012  . S/P AVR (aortic valve replacement) 11/21/2012  . PAF (paroxysmal atrial fibrillation) ([place]) 11/21/2012  . Dizziness 09/28/2011  . Diabetes mellitus ([place]) 09/07/2011  . Atrial flutter with rapid ventricular response ([place]) 02/22/2011  . BPH , Microscopic hematuria 01/17/2011  . Rosacea 01/17/2011  . CAROTID ARTERY DISEASE 06/24/2009  . DIZZINESS 04/04/2009  . Headache(784.0) 04/04/2009  . EMPHYSEMA 07/12/2008  . Mitral stenosis 06/29/2008  . CAD (coronary artery disease) 06/29/2008  . HYPERTENSION, PULMONARY 06/29/2008  . AORTIC STENOSIS, moderately severe 06/29/2008  . ABDOMINAL AORTIC ANEURYSM 06/29/2008  . Hyperlipidemia 04/14/2008  . Peripheral vascular disease ([place]) 04/14/2006  . GERD, Stricture per EGD 11 2014 04/14/2006    PHYSICAL THERAPY DISCHARGE SUMMARY  Visits from Start of Care: 3   Plan: Patient agrees to discharge.  Patient goals were not met. Patient is being discharged due to the patient's  request.  ?????     Scot Jun, PTA 12/15/2015, 9:30 AM  [place] [place], [place], [address] Phone: [phone]   Fax:  [phone]  Name: Fernando Moyer MRN: [id number] Date of Birth: 22-Sep-1933

## 2015-12-23 ENCOUNTER — Encounter: Payer: Self-pay | Admitting: Physical Therapy

## 2015-12-27 ENCOUNTER — Encounter: Payer: Self-pay | Admitting: Internal Medicine

## 2015-12-27 ENCOUNTER — Ambulatory Visit (INDEPENDENT_AMBULATORY_CARE_PROVIDER_SITE_OTHER): Payer: Medicare Other | Admitting: Internal Medicine

## 2015-12-27 VITALS — BP 132/58 | HR 87 | Temp 97.8°F | Resp 16 | Ht 67.0 in | Wt 212.4 lb

## 2015-12-27 DIAGNOSIS — R7989 Other specified abnormal findings of blood chemistry: Secondary | ICD-10-CM

## 2015-12-27 DIAGNOSIS — R946 Abnormal results of thyroid function studies: Secondary | ICD-10-CM | POA: Diagnosis not present

## 2015-12-27 DIAGNOSIS — E118 Type 2 diabetes mellitus with unspecified complications: Secondary | ICD-10-CM | POA: Diagnosis not present

## 2015-12-27 DIAGNOSIS — I6523 Occlusion and stenosis of bilateral carotid arteries: Secondary | ICD-10-CM

## 2015-12-27 DIAGNOSIS — Z23 Encounter for immunization: Secondary | ICD-10-CM | POA: Diagnosis not present

## 2015-12-27 LAB — HEMOGLOBIN A1C: HEMOGLOBIN A1C: 6.7 % — AB (ref 4.6–6.5)

## 2015-12-27 LAB — TSH: TSH: 3.53 u[IU]/mL (ref 0.35–4.50)

## 2015-12-27 NOTE — Progress Notes (Signed)
Subjective:    Patient ID: Fernando Moyer, male    DOB: December 22, 1933, 82 y.o.   MRN: [id number]  DOS:  12/27/2015 Type of visit - description : rov Interval history: Good medication compliance, has no major concerns GERD stricture: s/p ER eval, EGDs,  notes reviewed , had a food impaction Note from cardiology reviewed: Felt to be stable  Review of Systems  No chest pain other than the chronic soreness at the chest wall since heart surgery.  Past Medical History:  Diagnosis Date  . AAA (abdominal aortic aneurysm) ([place]) 05/2008   PER CARDIAC CATH 3.5 X 3. 5 CM  . Aortic stenosis 05/2008   PER CARDIAC CATH, MILD, MITRAL STENOSIS  . Back pain    cause unknown but does get Cortisone injection every now and then  . Benign neoplasm of colon   . Bowen's disease    right arm BX: referred to dermatology  . Carotid artery occlusion    last u/s 3-11...Marland Kitchenper vasc.surgery 2007 cath: nonobstructive CAD. mil;d MS, trivial AoS, small AAA,  . Cataract    RIGHT  . Chronic back pain   . COPD (chronic obstructive pulmonary disease) ([place])   . Coronary artery disease 05/2008   MILD, MEDICAL MANAGEMENT  . Diabetes mellitus ([place]) 09/07/2011   no per pt- no medications  . Dyspnea    with exertion only  . GERD with stricture    w/hx of stricture  . Hyperlipidemia    takes Pravastatin daily  . Mitral stenosis 05/2008   PER CARDIAC CATH  . Myocardial infarction ([place]) 2012  . Peripheral neuropathy (HCC)    right thigh; "it's been that way for years"  . Pulmonary hypertension ([place])   . PVD (peripheral vascular disease) ([place]) 12/07   per cath 12/07....iliac  . Stricture and stenosis of esophagus     Past Surgical History:  Procedure Laterality Date  . AORTIC VALVE REPLACEMENT  08/17/2011   Procedure: AORTIC VALVE REPLACEMENT (AVR);  Surgeon: Gaye Pollack, MD;  Location: [place];  Service: Open Heart Surgery;  Laterality: N/A;  . APPENDECTOMY    . CARDIAC CATHETERIZATION  02/2011   LAD 50, CFX OK,  RCA40, EF 55%; PCWP 17, AoV  0.9 cm squared  . COLONOSCOPY    . ENDARTERECTOMY  08-2010   Right carotid endarterectomy    . ESOPHAGEAL DILATION    . ESOPHAGOGASTRODUODENOSCOPY N/A 10/29/2015   Procedure: ESOPHAGOGASTRODUODENOSCOPY (EGD);  Surgeon: Jerene Bears, MD;  Location: [facility] ENDOSCOPY;  Service: Endoscopy;  Laterality: N/A;  . ESOPHAGOGASTRODUODENOSCOPY (EGD) WITH PROPOFOL N/A 11/10/2015   Procedure: ESOPHAGOGASTRODUODENOSCOPY (EGD) WITH PROPOFOL;  Surgeon: Milus Banister, MD;  [facility]OS[facility];  Service: Endoscopy;  Laterality: N/A;  . ESOPHAGOGASTRODUODENOSCOPY (EGD) WITH PROPOFOL N/A 11/24/2015   Procedure: ESOPHAGOGASTRODUODENOSCOPY (EGD) WITH PROPOFOL;  Surgeon: Milus Banister, MD;  [facility];  Service: Endoscopy;  Laterality: N/A;  dil  . eye lid surgery Bilateral ~ 07-2015  . EYE SURGERY  10/14/13   cataract surgery LEFT eye   . LEFT AND RIGHT HEART CATHETERIZATION WITH CORONARY ANGIOGRAM N/A 02/23/2011   Procedure: LEFT AND RIGHT HEART CATHETERIZATION WITH CORONARY ANGIOGRAM;  Surgeon: [name], MD;  Location: [facility] CATH LAB;  Service: Cardiovascular;  Laterality: N/A;  . STERNAL WIRES REMOVAL N/A 05/01/2013   Procedure: STERNAL WIRES REMOVAL;  Surgeon: Gaye Pollack, MD;  [facility] OR;  Service: Thoracic;  Laterality: N/A;    Social History   Social History  . Marital  status: Widowed    Spouse name: N/A  . Number of children: 2  . Years of education: N/A   Occupational History  . 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 Smoking status: Former Smoker    Packs/day: 2.00    Years: 30.00    Types: Cigarettes    Quit date: 04/03/1987  . Smokeless tobacco: Never Used     Comment: quit smoking in 1989  . Alcohol use No  . Drug use: No  . Sexual activity: Not on file   Other Topics Concern  . Not on file   Social History Narrative   LIVES BY HIMSELF    HAS 2  GROWN SONS, 5 G-KIDS (Melissa usually comes with him for his appointments)               Medication List       Accurate as of 12/27/15  5:53 PM. Always use your most recent med list.          acetaminophen 500 MG tablet Commonly known as:  TYLENOL Take 1,000 mg by mouth every 6 (six) hours as needed (pain). Reported on 04/21/2015   ELIQUIS 5 MG Tabs tablet Generic drug:  apixaban TAKE 1 TABLET TWICE A DAY   furosemide 40 MG tablet Commonly known as:  LASIX TAKE 1 TABLET DAILY   KLOR-CON M20 20 MEQ tablet Generic drug:  potassium chloride SA TAKE 1 TABLET DAILY   meclizine 25 MG tablet Commonly known as:  ANTIVERT TAKE 1 TABLET 3 TIMES A DAY AS NEEDED FOR DIZZINESS   omeprazole 40 MG capsule Commonly known as:  PRILOSEC Take 40 mg by mouth 2 (two) times daily.   pravastatin 40 MG tablet Commonly known as:  PRAVACHOL Take 1 tablet (40 mg total) by mouth daily.   tiotropium 18 MCG inhalation capsule Commonly known as:  SPIRIVA HANDIHALER Place 1 capsule (18 mcg total) into inhaler and inhale daily.   traMADol 50 MG tablet Commonly known as:  ULTRAM Take 1 tablet (50 mg total) by mouth every 12 (twelve) hours as needed. for pain          Objective:   Physical Exam BP (!) 132/58 (BP Location: Right Arm, Patient Position: Sitting, Cuff Size: Normal)   Pulse 87   Temp 97.8 F (36.6 C) (Oral)   Resp 16   Ht 5\' 7"  (1.702 m)   Wt 212 lb 6 oz (96.3 kg)   SpO2 96%   BMI 33.26 kg/m  General:   Well developed, well nourished . NAD.  HEENT:  Normocephalic . Face symmetric, atraumatic Lungs:  CTA B Normal respiratory effort, no intercostal retractions, no accessory muscle use. Heart: irreg,  Soft syst  murmur.  Trace  pretibial edema bilaterally  Skin: Not pale. Not jaundice Neurologic:  alert & oriented X3.  Speech normal, gait appropriate for age and unassisted Psych--  Cognition and judgment appear intact.  Cooperative with normal attention span and  concentration.  Behavior appropriate. No anxious or depressed appearing.      Assessment & Plan:   Assessment DM  w/ neuropathy (feet burning, nl exam) COPD Hyperlipidemia GERD with stricture, EGD x 3 ~ July 2017  CV: --CAD --Carotid artery disease-- R CEA 2012 --A. Fib-- paroxysmal - eliquis  --Aortic valve replacement (bio) --PVD: AAA, iliAortic valve, subclavian artery stenosis  MSK-- back pain, sees  Dr Nelva Bush skin cancer  PLAN DM: Last A1c 6.9, diet encourage, check A1c COPD: Flu shot today Hyperlipidemia: Last TG slightly elevated, again healthy diet is encouraged, recheck labs on RTC Elevated TSH: Borderline, recheck a TSH RTC 4-5 months

## 2015-12-27 NOTE — Progress Notes (Signed)
Pre visit review using our clinic review tool, if applicable. No additional management support is needed unless otherwise documented below in the visit note. 

## 2015-12-27 NOTE — Patient Instructions (Addendum)
GO TO THE LAB : Get the blood work     GO TO THE FRONT DESK Schedule your next appointment for a  Check up in 4-5 months, fasting   

## 2015-12-27 NOTE — Assessment & Plan Note (Signed)
DM: Last A1c 6.9, diet encourage, check A1c COPD: Flu shot today Hyperlipidemia: Last TG slightly elevated, again healthy diet is encouraged, recheck labs on RTC Elevated TSH: Borderline, recheck a TSH RTC 4-5 months

## 2016-01-17 ENCOUNTER — Encounter: Payer: Self-pay | Admitting: Gastroenterology

## 2016-01-18 ENCOUNTER — Encounter: Payer: Self-pay | Admitting: Gastroenterology

## 2016-02-02 DIAGNOSIS — Z85820 Personal history of malignant melanoma of skin: Secondary | ICD-10-CM | POA: Diagnosis not present

## 2016-02-02 DIAGNOSIS — Z08 Encounter for follow-up examination after completed treatment for malignant neoplasm: Secondary | ICD-10-CM | POA: Diagnosis not present

## 2016-02-02 DIAGNOSIS — L570 Actinic keratosis: Secondary | ICD-10-CM | POA: Diagnosis not present

## 2016-02-18 ENCOUNTER — Other Ambulatory Visit: Payer: Self-pay | Admitting: Cardiovascular Disease

## 2016-03-14 ENCOUNTER — Other Ambulatory Visit: Payer: Self-pay | Admitting: Internal Medicine

## 2016-04-25 ENCOUNTER — Ambulatory Visit: Payer: Medicare Other | Admitting: Family

## 2016-04-25 ENCOUNTER — Encounter (HOSPITAL_COMMUNITY): Payer: Medicare Other

## 2016-04-26 ENCOUNTER — Encounter: Payer: Self-pay | Admitting: Internal Medicine

## 2016-04-26 ENCOUNTER — Telehealth: Payer: Self-pay | Admitting: Internal Medicine

## 2016-04-26 ENCOUNTER — Ambulatory Visit (INDEPENDENT_AMBULATORY_CARE_PROVIDER_SITE_OTHER): Payer: Medicare Other | Admitting: Internal Medicine

## 2016-04-26 VITALS — BP 126/80 | HR 94 | Temp 97.8°F | Resp 16 | Ht 67.0 in | Wt 213.2 lb

## 2016-04-26 DIAGNOSIS — E1159 Type 2 diabetes mellitus with other circulatory complications: Secondary | ICD-10-CM | POA: Diagnosis not present

## 2016-04-26 DIAGNOSIS — R079 Chest pain, unspecified: Secondary | ICD-10-CM | POA: Diagnosis not present

## 2016-04-26 DIAGNOSIS — I25810 Atherosclerosis of coronary artery bypass graft(s) without angina pectoris: Secondary | ICD-10-CM

## 2016-04-26 LAB — BASIC METABOLIC PANEL
BUN: 12 mg/dL (ref 6–23)
CHLORIDE: 103 meq/L (ref 96–112)
CO2: 27 mEq/L (ref 19–32)
Calcium: 9 mg/dL (ref 8.4–10.5)
Creatinine, Ser: 0.97 mg/dL (ref 0.40–1.50)
GFR: 78.99 mL/min (ref >60.00)
Glucose, Bld: 176 mg/dL — ABNORMAL HIGH (ref 70–99)
POTASSIUM: 3.7 meq/L (ref 3.5–5.1)
SODIUM: 138 meq/L (ref 135–145)

## 2016-04-26 LAB — HEMOGLOBIN A1C: HEMOGLOBIN A1C: 6.9 % — AB (ref 4.6–6.5)

## 2016-04-26 NOTE — Progress Notes (Signed)
Subjective:    Patient ID: Fernando Moyer, male    DOB: Apr 02, 1934, 83 y.o.   MRN: [id number]  DOS:  04/26/2016 Type of visit - description : Routine checkup Interval history: Her main concern today is chest pain. This is going on for years, worse lately, located on the left side of the chest, at rest, not exertional, no obvious triggers, may last 10 minutes. No associated with sweats or nausea. On and off nasal bleeds abundant in 2-3 occasions otherwise very mild. Denies gum bleeding, blood in the stools or urine. Back pain, at baseline, on tramadol.   Review of Systems Denies actual  Abdominal pain, postprandial symptoms. Still has some dysphagia.   Past Medical History:  Diagnosis Date  . AAA (abdominal aortic aneurysm) ([place]) 05/2008   PER CARDIAC CATH 3.5 X 3. 5 CM  . Aortic stenosis 05/2008   PER CARDIAC CATH, MILD, MITRAL STENOSIS  . Back pain    cause unknown but does get Cortisone injection every now and then  . Benign neoplasm of colon   . Bowen's disease    right arm BX: referred to dermatology  . Carotid artery occlusion    last u/s 3-11...Marland Kitchenper vasc.surgery 2007 cath: nonobstructive CAD. mil;d MS, trivial AoS, small AAA,  . Cataract    RIGHT  . Chronic back pain   . COPD (chronic obstructive pulmonary disease) ([place])   . Coronary artery disease 05/2008   MILD, MEDICAL MANAGEMENT  . Diabetes mellitus ([place]) 09/07/2011   no per pt- no medications  . Dyspnea    with exertion only  . GERD with stricture    w/hx of stricture  . Hyperlipidemia    takes Pravastatin daily  . Mitral stenosis 05/2008   PER CARDIAC CATH  . Myocardial infarction 2012  . Peripheral neuropathy (HCC)    right thigh; "it's been that way for years"  . Pulmonary hypertension   . PVD (peripheral vascular disease) ([place]) 12/07   per cath 12/07....iliac  . Stricture and stenosis of esophagus     Past Surgical History:  Procedure Laterality Date  . AORTIC VALVE REPLACEMENT  08/17/2011   Procedure: AORTIC VALVE REPLACEMENT (AVR);  Surgeon: Gaye Pollack, MD;  Location: [place];  Service: Open Heart Surgery;  Laterality: N/A;  . APPENDECTOMY    . CARDIAC CATHETERIZATION  02/2011   LAD 50, CFX OK, RCA40, EF 55%; PCWP 17, AoV  0.9 cm squared  . COLONOSCOPY    . ENDARTERECTOMY  08-2010   Right carotid endarterectomy    . ESOPHAGEAL DILATION    . ESOPHAGOGASTRODUODENOSCOPY N/A 10/29/2015   Procedure: ESOPHAGOGASTRODUODENOSCOPY (EGD);  Surgeon: Jerene Bears, MD;  [facility] ENDOSCOPY;  Service: Endoscopy;  Laterality: N/A;  . ESOPHAGOGASTRODUODENOSCOPY (EGD) WITH PROPOFOL N/A 11/10/2015   Procedure: ESOPHAGOGASTRODUODENOSCOPY (EGD) WITH PROPOFOL;  Surgeon: Milus Banister, MD;  [facility];  Service: Endoscopy;  Laterality: N/A;  . ESOPHAGOGASTRODUODENOSCOPY (EGD) WITH PROPOFOL N/A 11/24/2015   Procedure: ESOPHAGOGASTRODUODENOSCOPY (EGD) WITH PROPOFOL;  Surgeon: Milus Banister, MD;  [facility]OS[facility];  Service: Endoscopy;  Laterality: N/A;  dil  . eye lid surgery Bilateral ~ 07-2015  . EYE SURGERY  10/14/13   cataract surgery LEFT eye   . LEFT AND RIGHT HEART CATHETERIZATION WITH CORONARY ANGIOGRAM N/A 02/23/2011   Procedure: LEFT AND RIGHT HEART CATHETERIZATION WITH CORONARY ANGIOGRAM;  Surgeon: [name], MD;  Location: [facility] CATH LAB;  Service: Cardiovascular;  Laterality: N/A;  . STERNAL WIRES REMOVAL N/A 05/01/2013  Procedure: STERNAL WIRES REMOVAL;  Surgeon: Gaye Pollack, MD;  [facility];  Service: Thoracic;  Laterality: N/A;    Social History   Social History  . Marital status: Widowed    Spouse name: N/A  . Number of children: 2  . Years of education: N/A   Occupational History  . 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 Smoking status: Former Smoker    Packs/day: 2.00    Years: 30.00    Types: Cigarettes    Quit date: 04/03/1987  . Smokeless tobacco:  Never Used     Comment: quit smoking in 1989  . Alcohol use No  . Drug use: No  . Sexual activity: Not on file   Other Topics Concern  . Not on file   Social History Narrative   LIVES BY HIMSELF    HAS 2 GROWN SONS, 5 G-KIDS (Melissa usually comes with him for his appointments)             Allergies as of 04/26/2016   No Known Allergies     Medication List       Accurate as of 04/26/16  7:12 PM. Always use your most recent med list.          acetaminophen 500 MG tablet Commonly known as:  TYLENOL Take 1,000 mg by mouth every 6 (six) hours as needed (pain). Reported on 04/21/2015   ELIQUIS 5 MG Tabs tablet Generic drug:  apixaban TAKE 1 TABLET TWICE A DAY   furosemide 40 MG tablet Commonly known as:  LASIX TAKE 1 TABLET DAILY   KLOR-CON M20 20 MEQ tablet Generic drug:  potassium chloride SA TAKE 1 TABLET DAILY   meclizine 25 MG tablet Commonly known as:  ANTIVERT TAKE 1 TABLET 3 TIMES A DAY AS NEEDED FOR DIZZINESS   omeprazole 40 MG capsule Commonly known as:  PRILOSEC Take 40 mg by mouth 2 (two) times daily.   pravastatin 40 MG tablet Commonly known as:  PRAVACHOL Take 1 tablet (40 mg total) by mouth daily.   tiotropium 18 MCG inhalation capsule Commonly known as:  SPIRIVA HANDIHALER Place 1 capsule (18 mcg total) into inhaler and inhale daily.   traMADol 50 MG tablet Commonly known as:  ULTRAM Take 1 tablet (50 mg total) by mouth every 12 (twelve) hours as needed. for pain          Objective:   Physical Exam BP 126/80 (BP Location: Left Arm, Patient Position: Sitting, Cuff Size: Normal)   Pulse 94   Temp 97.8 F (36.6 C) (Oral)   Resp 16   Ht 5\' 7"  (1.702 m)   Wt 213 lb 4 oz (96.7 kg)   SpO2 95%   BMI 33.40 kg/m  General:   Well developed, well nourished . NAD.  HEENT:  Normocephalic . Face symmetric, atraumatic Lungs:  CTA B Normal respiratory effort, no intercostal retractions, no accessory muscle use. Chest wall : Mild to  moderately TTP at the anterior chest, worse on the left Heart: RRR, soft systolic murmur.  no pretibial edema bilaterally  Abdomen:  Not distended, soft, non-tender. No rebound or rigidity.  Skin: Not pale. Not jaundice Neurologic:  alert & oriented X3.  Speech normal, gait appropriate for age and unassisted Psych--  Cognition and judgment appear intact.  Cooperative with normal attention span and concentration.  Behavior appropriate. No  anxious or depressed appearing.    Assessment & Plan:   Assessment DM  w/ neuropathy (feet burning, nl exam) COPD Hyperlipidemia GERD with stricture, EGD x 3 ~ July 2017  CV: --CAD,  Last myoview 2014 --Carotid artery disease-- R CEA 2012 --A. Fib-- paroxysmal - eliquis  --Aortic valve replacement (bio) --PVD: AAA, iliAortic valve, subclavian artery stenosis  --[id number] open heart surgery 2013. CT chest 2014 no PE, gallbladder stones. MSK-- back pain, sees Dr Nelva Bush skin cancer  PLAN DM: Diet control, check A1c Hyperlipidemia: Last FLP satisfactory, continue Pravachol CAD: Chronic CP,  apparently slightly worse lately. Exam is consistent with a MSK issue. EKG- no different from previous. Will get a BMP. Recommend ER if symptoms severe otherwise to keep his appointment with cardiology Atrial fibrillation: Anticoagulated, occasional nosebleeds. If that increase will refer to ENT RTC 07-2016 for Medicare wellness with my nurse and a routine checkup.

## 2016-04-26 NOTE — Patient Instructions (Addendum)
GO TO THE LAB : Get the blood work     GO TO THE FRONT DESK Schedule your next appointment for a  routine check up by 07/2016. Schedule a Medicare wellness the same day with one of our registered nurses  ER if severe or persistent chest pain  Call if the nosebleeds increase in frequency and intensity. Use saline nose drops daily.

## 2016-04-26 NOTE — Telephone Encounter (Signed)
08/22/15 PR PPPS, SUBSEQ VISIT [id number] patient scheduled AWV for 08/21/16 with Hoyle Sauer at 8:30am and follow up with PCP at 9:30am.

## 2016-04-26 NOTE — Assessment & Plan Note (Signed)
DM: Diet control, check A1c Hyperlipidemia: Last FLP satisfactory, continue Pravachol CAD: Chronic CP,  apparently slightly worse lately. Exam is consistent with a MSK issue. EKG- no different from previous. Will get a BMP. Recommend ER if symptoms severe otherwise to keep his appointment with cardiology Atrial fibrillation: Anticoagulated, occasional nosebleeds. If that increase will refer to ENT RTC 07-2016 for Medicare wellness with my nurse and a routine checkup.

## 2016-04-26 NOTE — Progress Notes (Signed)
Pre visit review using our clinic review tool, if applicable. No additional management support is needed unless otherwise documented below in the visit note. 

## 2016-04-30 ENCOUNTER — Encounter: Payer: Self-pay | Admitting: Vascular Surgery

## 2016-05-02 ENCOUNTER — Ambulatory Visit: Payer: Medicare Other | Admitting: Family

## 2016-05-02 ENCOUNTER — Encounter (HOSPITAL_COMMUNITY): Payer: Medicare Other

## 2016-05-08 ENCOUNTER — Ambulatory Visit (INDEPENDENT_AMBULATORY_CARE_PROVIDER_SITE_OTHER): Payer: Medicare Other | Admitting: Internal Medicine

## 2016-05-08 ENCOUNTER — Encounter: Payer: Self-pay | Admitting: Internal Medicine

## 2016-05-08 VITALS — BP 134/82 | HR 93 | Temp 97.6°F | Resp 14 | Ht 67.0 in | Wt 211.5 lb

## 2016-05-08 DIAGNOSIS — J441 Chronic obstructive pulmonary disease with (acute) exacerbation: Secondary | ICD-10-CM

## 2016-05-08 DIAGNOSIS — I25810 Atherosclerosis of coronary artery bypass graft(s) without angina pectoris: Secondary | ICD-10-CM | POA: Diagnosis not present

## 2016-05-08 MED ORDER — PREDNISONE 10 MG PO TABS: ORAL_TABLET | ORAL | 0 refills | Status: DC

## 2016-05-08 MED ORDER — ALBUTEROL SULFATE HFA 108 (90 BASE) MCG/ACT IN AERS: 2.0000 | INHALATION_SPRAY | Freq: Four times a day (QID) | RESPIRATORY_TRACT | 1 refills | Status: DC | PRN

## 2016-05-08 MED ORDER — AZITHROMYCIN 250 MG PO TABS: ORAL_TABLET | ORAL | 0 refills | Status: DC

## 2016-05-08 NOTE — Progress Notes (Signed)
Subjective:    Patient ID: Fernando Moyer, male    DOB: 1933-04-09, 83 y.o.   MRN: [id number]  DOS:  05/08/2016 Type of visit - description : Acute visit Interval history: Symptoms started -according to the patient- few weeks ago, gradually getting worse: Sore throat, cough above baseline, gray colored sputum present. He feels fatigue, slightly weak. Also has noted  nasal discharge sometimes bloody.   Review of Systems Denies fevers , having some chills. Son has similar symptoms No chest pain but difficulty breathing and wheezing above baseline. Good compliance with the Spiriva.  Past Medical History:  Diagnosis Date  . AAA (abdominal aortic aneurysm) ([place]) 05/2008   PER CARDIAC CATH 3.5 X 3. 5 CM  . Aortic stenosis 05/2008   PER CARDIAC CATH, MILD, MITRAL STENOSIS  . Back pain    cause unknown but does get Cortisone injection every now and then  . Benign neoplasm of colon   . Bowen's disease    right arm BX: referred to dermatology  . Carotid artery occlusion    last u/s 3-11...Marland Kitchenper vasc.surgery 2007 cath: nonobstructive CAD. mil;d MS, trivial AoS, small AAA,  . Cataract    RIGHT  . Chronic back pain   . COPD (chronic obstructive pulmonary disease) ([place])   . Coronary artery disease 05/2008   MILD, MEDICAL MANAGEMENT  . Diabetes mellitus ([place]) 09/07/2011   no per pt- no medications  . Dyspnea    with exertion only  . GERD with stricture    w/hx of stricture  . Hyperlipidemia    takes Pravastatin daily  . Mitral stenosis 05/2008   PER CARDIAC CATH  . Myocardial infarction 2012  . Peripheral neuropathy ([facility])    right thigh; "it's been that way for years"  . Pulmonary hypertension   . PVD (peripheral vascular disease) ([place]) 12/07   per cath 12/07....iliac  . Stricture and stenosis of esophagus     Past Surgical History:  Procedure Laterality Date  . AORTIC VALVE REPLACEMENT  08/17/2011   Procedure: AORTIC VALVE REPLACEMENT (AVR);  Surgeon: Gaye Pollack, MD;   Location: [facility];  Service: Open Heart Surgery;  Laterality: N/A;  . APPENDECTOMY    . CARDIAC CATHETERIZATION  02/2011   LAD 50, CFX OK, RCA40, EF 55%; PCWP 17, AoV  0.9 cm squared  . COLONOSCOPY    . ENDARTERECTOMY  08-2010   Right carotid endarterectomy    . ESOPHAGEAL DILATION    . ESOPHAGOGASTRODUODENOSCOPY N/A 10/29/2015   Procedure: ESOPHAGOGASTRODUODENOSCOPY (EGD);  Surgeon: Jerene Bears, MD;  Location: [facility] ENDOSCOPY;  Service: Endoscopy;  Laterality: N/A;  . ESOPHAGOGASTRODUODENOSCOPY (EGD) WITH PROPOFOL N/A 11/10/2015   Procedure: ESOPHAGOGASTRODUODENOSCOPY (EGD) WITH PROPOFOL;  Surgeon: Milus Banister, MD;  Location: [facility] ENDOSCOPY;  Service: Endoscopy;  Laterality: N/A;  . ESOPHAGOGASTRODUODENOSCOPY (EGD) WITH PROPOFOL N/A 11/24/2015   Procedure: ESOPHAGOGASTRODUODENOSCOPY (EGD) WITH PROPOFOL;  Surgeon: Milus Banister, MD;  Location: [facility] ENDOSCOPY;  Service: Endoscopy;  Laterality: N/A;  dil  . eye lid surgery Bilateral ~ 07-2015  . EYE SURGERY  10/14/13   cataract surgery LEFT eye   . LEFT AND RIGHT HEART CATHETERIZATION WITH CORONARY ANGIOGRAM N/A 02/23/2011   Procedure: LEFT AND RIGHT HEART CATHETERIZATION WITH CORONARY ANGIOGRAM;  Surgeon: [name], MD;  Location: [facility] CATH LAB;  Service: Cardiovascular;  Laterality: N/A;  . STERNAL WIRES REMOVAL N/A 05/01/2013   Procedure: STERNAL WIRES REMOVAL;  Surgeon: Gaye Pollack, MD;  Location: [facility];  Service: Thoracic;  Laterality: N/A;    Social History   Social History  . Marital status: Widowed    Spouse name: N/A  . Number of children: 2  . Years of education: N/A   Occupational History  . 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 Smoking status: Former Smoker    Packs/day: 2.00    Years: 30.00    Types: Cigarettes    Quit date: 04/03/1987  . Smokeless tobacco: Never Used     Comment: quit smoking in 1989  . Alcohol use No  . Drug  use: No  . Sexual activity: Not on file   Other Topics Concern  . Not on file   Social History Narrative   LIVES BY HIMSELF    HAS 2 GROWN SONS, 5 G-KIDS (Melissa usually comes with him for his appointments)             Allergies as of 05/08/2016   No Known Allergies     Medication List       Accurate as of 05/08/16  1:00 PM. Always use your most recent med list.          acetaminophen 500 MG tablet Commonly known as:  TYLENOL Take 1,000 mg by mouth every 6 (six) hours as needed (pain). Reported on 04/21/2015   albuterol 108 (90 Base) MCG/ACT inhaler Commonly known as:  VENTOLIN HFA Inhale 2 puffs into the lungs every 6 (six) hours as needed for wheezing or shortness of breath.   azithromycin 250 MG tablet Commonly known as:  ZITHROMAX Z-PAK 2 tabs a day the first day, then 1 tab a day x 4 days   ELIQUIS 5 MG Tabs tablet Generic drug:  apixaban TAKE 1 TABLET TWICE A DAY   furosemide 40 MG tablet Commonly known as:  LASIX TAKE 1 TABLET DAILY   KLOR-CON M20 20 MEQ tablet Generic drug:  potassium chloride SA TAKE 1 TABLET DAILY   meclizine 25 MG tablet Commonly known as:  ANTIVERT TAKE 1 TABLET 3 TIMES A DAY AS NEEDED FOR DIZZINESS   omeprazole 40 MG capsule Commonly known as:  PRILOSEC Take 40 mg by mouth 2 (two) times daily.   pravastatin 40 MG tablet Commonly known as:  PRAVACHOL Take 1 tablet (40 mg total) by mouth daily.   predniSONE 10 MG tablet Commonly known as:  DELTASONE 2 tabs a day x 5 days   tiotropium 18 MCG inhalation capsule Commonly known as:  SPIRIVA HANDIHALER Place 1 capsule (18 mcg total) into inhaler and inhale daily.   traMADol 50 MG tablet Commonly known as:  ULTRAM Take 1 tablet (50 mg total) by mouth every 12 (twelve) hours as needed. for pain          Objective:   Physical Exam BP 134/82 (BP Location: Left Arm, Patient Position: Sitting, Cuff Size: Normal)   Pulse 93   Temp 97.6 F (36.4 C) (Oral)   Resp 14   Ht  5\' 7"  (1.702 m)   Wt 211 lb 8 oz (95.9 kg)   SpO2 94%   BMI 33.13 kg/m  General:   Well developed, well nourished . NAD.  HEENT:  Normocephalic . Face symmetric, atraumatic. TMs normal, throat symmetric, no red. Nose congested Lungs:  Slightly decreased breath sounds, a few rhonchi, prolonged expiratory time and end mild expiratory wheezing Normal respiratory effort, no intercostal retractions,  no accessory muscle use. Heart: irreg,  no murmur.  No pretibial edema bilaterally  Skin: Not pale. Not jaundice Neurologic:  alert & oriented X3.  Speech normal, gait appropriate for age and unassisted Psych--  Cognition and judgment appear intact.  Cooperative with normal attention span and concentration.  Behavior appropriate. No anxious or depressed appearing.      Assessment & Plan:     Assessment DM  w/ neuropathy (feet burning, nl exam) COPD Hyperlipidemia GERD with stricture, EGD x 3 ~ July 2017  CV: --CAD,  Last myoview 2014 --Carotid artery disease-- R CEA 2012 --A. Fib-- paroxysmal - eliquis  --Aortic valve replacement (bio) --PVD: AAA, iliAortic valve, subclavian artery stenosis  --[id number] open heart surgery 2013. CT chest 2014 no PE, gallbladder stones. MSK-- back pain, sees Dr Nelva Bush skin cancer  PLAN Mild  COPD exacerbation: Prednisone 20 mg 5 days, Mucinex DM, and cont. spiriva, add albuterol as needed, zpack. See AVS.

## 2016-05-08 NOTE — Patient Instructions (Signed)
Rest, fluids , tylenol  For cough:  Take Mucinex DM twice a day as needed until better  For wheezing and chest congestion: Continue Spiriva daily Take albuterol 2 puffs every 6 hours as needed   For nasal congestion: Use OTC Nasocort or Flonase : 2 nasal sprays on each side of the nose in the morning until you feel better   Avoid decongestants such as  Pseudoephedrine or phenylephrine     Take the antibiotic as prescribed  , Zithromax  Take prednisone as prescribed   Call if not gradually better over the next  10 days  Call anytime if the symptoms are severe

## 2016-05-08 NOTE — Assessment & Plan Note (Signed)
Mild  COPD exacerbation: Prednisone 20 mg 5 days, Mucinex DM, and cont. spiriva, add albuterol as needed, zpack. See AVS.

## 2016-05-08 NOTE — Progress Notes (Signed)
Pre visit review using our clinic review tool, if applicable. No additional management support is needed unless otherwise documented below in the visit note. 

## 2016-05-10 ENCOUNTER — Encounter: Payer: Self-pay | Admitting: Family

## 2016-05-16 ENCOUNTER — Ambulatory Visit (HOSPITAL_COMMUNITY)
Admission: RE | Admit: 2016-05-16 | Discharge: 2016-05-16 | Disposition: A | Discharge status: Home / Self Care | Payer: Medicare Other | Source: Ambulatory Visit | Attending: Vascular Surgery | Admitting: Vascular Surgery

## 2016-05-16 ENCOUNTER — Encounter: Payer: Self-pay | Admitting: Family

## 2016-05-16 ENCOUNTER — Ambulatory Visit (INDEPENDENT_AMBULATORY_CARE_PROVIDER_SITE_OTHER): Payer: Medicare Other | Admitting: Family

## 2016-05-16 VITALS — BP 110/63 | HR 83 | Temp 98.4°F | Resp 24 | Ht 67.0 in | Wt 209.0 lb

## 2016-05-16 DIAGNOSIS — I771 Stricture of artery: Secondary | ICD-10-CM | POA: Diagnosis not present

## 2016-05-16 DIAGNOSIS — I745 Embolism and thrombosis of iliac artery: Secondary | ICD-10-CM | POA: Diagnosis not present

## 2016-05-16 DIAGNOSIS — Z9889 Other specified postprocedural states: Secondary | ICD-10-CM | POA: Insufficient documentation

## 2016-05-16 DIAGNOSIS — Z87891 Personal history of nicotine dependence: Secondary | ICD-10-CM | POA: Diagnosis not present

## 2016-05-16 DIAGNOSIS — I6523 Occlusion and stenosis of bilateral carotid arteries: Secondary | ICD-10-CM | POA: Diagnosis not present

## 2016-05-16 DIAGNOSIS — I70213 Atherosclerosis of native arteries of extremities with intermittent claudication, bilateral legs: Secondary | ICD-10-CM

## 2016-05-16 DIAGNOSIS — I714 Abdominal aortic aneurysm, without rupture, unspecified: Secondary | ICD-10-CM

## 2016-05-16 LAB — VAS US CAROTID
Left CCA dist dias: 15 cm/s
Left CCA dist sys: 97 cm/s
Left CCA prox dias: 13 cm/s
Left CCA prox sys: 123 cm/s
RIGHT CCA MID DIAS: 9 cm/s
RIGHT VERTEBRAL DIAS: 4 cm/s
Right CCA prox dias: 9 cm/s
Right CCA prox sys: 158 cm/s
Right cca dist sys: -145 cm/s

## 2016-05-16 NOTE — Patient Instructions (Signed)
Stroke Prevention Some medical conditions and behaviors are associated with an increased chance of having a stroke. You may prevent a stroke by making healthy choices and managing medical conditions. How can I reduce my risk of having a stroke?  Stay physically active. Get at least 30 minutes of activity on most or all days.  Do not smoke. It may also be helpful to avoid exposure to secondhand smoke.  Limit alcohol use. Moderate alcohol use is considered to be:  No more than 2 drinks per day for men.  No more than 1 drink per day for nonpregnant women.  Eat healthy foods. This involves:  Eating 5 or more servings of fruits and vegetables a day.  Making dietary changes that address high blood pressure (hypertension), high cholesterol, diabetes, or obesity.  Manage your cholesterol levels.  Making food choices that are high in fiber and low in saturated fat, trans fat, and cholesterol may control cholesterol levels.  Take any prescribed medicines to control cholesterol as directed by your health care provider.  Manage your diabetes.  Controlling your carbohydrate and sugar intake is recommended to manage diabetes.  Take any prescribed medicines to control diabetes as directed by your health care provider.  Control your hypertension.  Making food choices that are low in salt (sodium), saturated fat, trans fat, and cholesterol is recommended to manage hypertension.  Ask your health care provider if you need treatment to lower your blood pressure. Take any prescribed medicines to control hypertension as directed by your health care provider.  If you are 18-39 years of age, have your blood pressure checked every 3-5 years. If you are 40 years of age or older, have your blood pressure checked every year.  Maintain a healthy weight.  Reducing calorie intake and making food choices that are low in sodium, saturated fat, trans fat, and cholesterol are recommended to manage  weight.  Stop drug abuse.  Avoid taking birth control pills.  Talk to your health care provider about the risks of taking birth control pills if you are over 35 years old, smoke, get migraines, or have ever had a blood clot.  Get evaluated for sleep disorders (sleep apnea).  Talk to your health care provider about getting a sleep evaluation if you snore a lot or have excessive sleepiness.  Take medicines only as directed by your health care provider.  For some people, aspirin or blood thinners (anticoagulants) are helpful in reducing the risk of forming abnormal blood clots that can lead to stroke. If you have the irregular heart rhythm of atrial fibrillation, you should be on a blood thinner unless there is a good reason you cannot take them.  Understand all your medicine instructions.  Make sure that other conditions (such as anemia or atherosclerosis) are addressed. Get help right away if:  You have sudden weakness or numbness of the face, arm, or leg, especially on one side of the body.  Your face or eyelid droops to one side.  You have sudden confusion.  You have trouble speaking (aphasia) or understanding.  You have sudden trouble seeing in one or both eyes.  You have sudden trouble walking.  You have dizziness.  You have a loss of balance or coordination.  You have a sudden, severe headache with no known cause.  You have new chest pain or an irregular heartbeat. Any of these symptoms may represent a serious problem that is an emergency. Do not wait to see if the symptoms will go away.   Get medical help at once. Call your local emergency services (911 in [place] Do not drive yourself to the hospital. This information is not intended to replace advice given to you by your health care provider. Make sure you discuss any questions you have with your health care provider. Document Released: 04/26/2004 Document Revised: 08/25/2015 Document Reviewed: 09/19/2012 Elsevier  Interactive Patient Education  2017 [employer]  

## 2016-05-16 NOTE — Progress Notes (Addendum)
[facility] [place] HISTORY AND PHYSICAL   MRN : [id number]  History of Present Illness:   Fernando Moyer is a 83 y.o. male patient of Dr. Scot Dock who is s/p right carotid endarterectomy in May 2012. We've been following a moderate left carotid stenosis. He comes in for a 1 year follow up visit.   He has a small AAA that his cardiologist had been following (review of records, see report below). He had his aortic valve replaced in 2013, (bovine sourced, per pt). Dr. Burt Knack notes on 12/13/14 that pt has paroxysmal atrial fib.  He has known lumbar spine issues with radiculopathy sx's in both legs; states he has tried ESI's with little success per pt; states he is not a surgery candidate for this, sees Dr. Nelva Bush for this. He started riding his stationary bike a few days ago, 15 minutes daily.  He reports bilateral calf pain after walking about 300 feet, resolves with rest, states this is improving.   The patient denies any history of TIA or stroke symptoms, specifically the patient denies a history of amaurosis fugax or monocular blindness, denies a history unilateral  of facial drooping, denies a history of hemiplegia, and denies a history of receptive or expressive aphasia.   He has not had dizziness "in a long time".  He reports tingling and numbness in both 5th fingers, left more so than right. He denies pain or cold sensation in either UE. He also reports left hand occasionally feels stiff, could not use it for about 2 minutes, then resolved, states he can live with this for now.   Pt Diabetic: yes  A1C was 6.9 on 04-26-16 (reveiw of records) Pt smoker: quit in 1989, started about age 64  Pt meds include: Statin : yes ASA: no Other anticoagulants/antiplatelets: Eliquis, paroxysmal atrial fib.       Current Outpatient Prescriptions  Medication Sig Dispense Refill  . acetaminophen (TYLENOL) 500 MG tablet Take 1,000 mg by mouth every 6 (six) hours as  needed (pain). Reported on 04/21/2015    . albuterol (VENTOLIN HFA) 108 (90 Base) MCG/ACT inhaler Inhale 2 puffs into the lungs every 6 (six) hours as needed for wheezing or shortness of breath. 1 Inhaler 1  . azithromycin (ZITHROMAX Z-PAK) 250 MG tablet 2 tabs a day the first day, then 1 tab a day x 4 days 6 tablet 0  . ELIQUIS 5 MG TABS tablet TAKE 1 TABLET TWICE A DAY 180 tablet 2  . furosemide (LASIX) 40 MG tablet TAKE 1 TABLET DAILY 90 tablet 2  . KLOR-CON M20 20 MEQ tablet TAKE 1 TABLET DAILY 90 tablet 2  . meclizine (ANTIVERT) 25 MG tablet TAKE 1 TABLET 3 TIMES A DAY AS NEEDED FOR DIZZINESS 30 tablet 1  . omeprazole (PRILOSEC) 40 MG capsule Take 40 mg by mouth 2 (two) times daily.    . pravastatin (PRAVACHOL) 40 MG tablet Take 1 tablet (40 mg total) by mouth daily. (Patient taking differently: Take 40 mg by mouth every evening. ) 90 tablet 2  . predniSONE (DELTASONE) 10 MG tablet 2 tabs a day x 5 days 10 tablet 0  . tiotropium (SPIRIVA HANDIHALER) 18 MCG inhalation capsule Place 1 capsule (18 mcg total) into inhaler and inhale daily. 90 capsule 3  . traMADol (ULTRAM) 50 MG tablet Take 1 tablet (50 mg total) by mouth every 12 (twelve) hours as needed. for pain 60 tablet 1   No current facility-administered medications for this visit.  Past Medical History:  Diagnosis Date  . AAA (abdominal aortic aneurysm) ([place]) 05/2008   PER CARDIAC CATH 3.5 X 3. 5 CM  . Aortic stenosis 05/2008   PER CARDIAC CATH, MILD, MITRAL STENOSIS  . Back pain    cause unknown but does get Cortisone injection every now and then  . Benign neoplasm of colon   . Bowen's disease    right arm BX: referred to dermatology  . Carotid artery occlusion    last u/s 3-11...Marland Kitchenper vasc.surgery 2007 cath: nonobstructive CAD. mil;d MS, trivial AoS, small AAA,  . Cataract    RIGHT  . Chronic back pain   . COPD (chronic obstructive pulmonary disease) ([place])   . Coronary artery disease 05/2008   MILD, MEDICAL MANAGEMENT  .  Diabetes mellitus ([place]) 09/07/2011   no per pt- no medications  . Dyspnea    with exertion only  . GERD with stricture    w/hx of stricture  . Hyperlipidemia    takes Pravastatin daily  . Mitral stenosis 05/2008   PER CARDIAC CATH  . Myocardial infarction 2012  . Peripheral neuropathy (HCC)    right thigh; "it's been that way for years"  . Pulmonary hypertension   . PVD (peripheral vascular disease) ([place]) 12/07   per cath 12/07....iliac  . Stricture and stenosis of esophagus     Social History Social History  Substance Use Topics  . Smoking status: Former Smoker    Packs/day: 2.00    Years: 30.00    Types: Cigarettes    Quit date: 04/03/1987  . Smokeless tobacco: Never Used     Comment: quit smoking in 1989  . Alcohol use No    Family History Family History  Problem Relation Age of Onset  . Heart attack Father 33    MI  . Prostate cancer Brother 34  . Diabetes Neg Hx   . Colon cancer Neg Hx   . Anesthesia problems Neg Hx   . Hypotension Neg Hx   . Malignant hyperthermia Neg Hx   . Pseudochol deficiency Neg Hx   . Stomach cancer Neg Hx   . Esophageal cancer Neg Hx   . Rectal cancer Neg Hx     Surgical History Past Surgical History:  Procedure Laterality Date  . AORTIC VALVE REPLACEMENT  08/17/2011   Procedure: AORTIC VALVE REPLACEMENT (AVR);  Surgeon: Gaye Pollack, MD;  Location: [facility];  Service: Open Heart Surgery;  Laterality: N/A;  . APPENDECTOMY    . CARDIAC CATHETERIZATION  02/2011   LAD 50, CFX OK, RCA40, EF 55%; PCWP 17, AoV  0.9 cm squared  . COLONOSCOPY    . ENDARTERECTOMY  08-2010   Right carotid endarterectomy    . ESOPHAGEAL DILATION    . ESOPHAGOGASTRODUODENOSCOPY N/A 10/29/2015   Procedure: ESOPHAGOGASTRODUODENOSCOPY (EGD);  Surgeon: Jerene Bears, MD;  Location: [facility] ENDOSCOPY;  Service: Endoscopy;  Laterality: N/A;  . ESOPHAGOGASTRODUODENOSCOPY (EGD) WITH PROPOFOL N/A 11/10/2015   Procedure: ESOPHAGOGASTRODUODENOSCOPY (EGD) WITH PROPOFOL;  Surgeon:  Milus Banister, MD;  Location: [facility] ENDOSCOPY;  Service: Endoscopy;  Laterality: N/A;  . ESOPHAGOGASTRODUODENOSCOPY (EGD) WITH PROPOFOL N/A 11/24/2015   Procedure: ESOPHAGOGASTRODUODENOSCOPY (EGD) WITH PROPOFOL;  Surgeon: Milus Banister, MD;  Location: [facility] ENDOSCOPY;  Service: Endoscopy;  Laterality: N/A;  dil  . eye lid surgery Bilateral ~ 07-2015  . EYE SURGERY  10/14/13   cataract surgery LEFT eye   . LEFT AND RIGHT HEART CATHETERIZATION WITH CORONARY ANGIOGRAM N/A 02/23/2011   Procedure: LEFT  AND RIGHT HEART CATHETERIZATION WITH CORONARY ANGIOGRAM;  Surgeon: [name], MD;  Location: [facility] CATH LAB;  Service: Cardiovascular;  Laterality: N/A;  . STERNAL WIRES REMOVAL N/A 05/01/2013   Procedure: STERNAL WIRES REMOVAL;  Surgeon: Gaye Pollack, MD;  Location: [facility] OR;  Service: Thoracic;  Laterality: N/A;    No Known Allergies  Current Outpatient Prescriptions  Medication Sig Dispense Refill  . acetaminophen (TYLENOL) 500 MG tablet Take 1,000 mg by mouth every 6 (six) hours as needed (pain). Reported on 04/21/2015    . albuterol (VENTOLIN HFA) 108 (90 Base) MCG/ACT inhaler Inhale 2 puffs into the lungs every 6 (six) hours as needed for wheezing or shortness of breath. 1 Inhaler 1  . azithromycin (ZITHROMAX Z-PAK) 250 MG tablet 2 tabs a day the first day, then 1 tab a day x 4 days 6 tablet 0  . ELIQUIS 5 MG TABS tablet TAKE 1 TABLET TWICE A DAY 180 tablet 2  . furosemide (LASIX) 40 MG tablet TAKE 1 TABLET DAILY 90 tablet 2  . KLOR-CON M20 20 MEQ tablet TAKE 1 TABLET DAILY 90 tablet 2  . meclizine (ANTIVERT) 25 MG tablet TAKE 1 TABLET 3 TIMES A DAY AS NEEDED FOR DIZZINESS 30 tablet 1  . omeprazole (PRILOSEC) 40 MG capsule Take 40 mg by mouth 2 (two) times daily.    . pravastatin (PRAVACHOL) 40 MG tablet Take 1 tablet (40 mg total) by mouth daily. (Patient taking differently: Take 40 mg by mouth every evening. ) 90 tablet 2  . predniSONE (DELTASONE) 10 MG tablet 2 tabs a day x 5 days 10 tablet 0   . tiotropium (SPIRIVA HANDIHALER) 18 MCG inhalation capsule Place 1 capsule (18 mcg total) into inhaler and inhale daily. 90 capsule 3  . traMADol (ULTRAM) 50 MG tablet Take 1 tablet (50 mg total) by mouth every 12 (twelve) hours as needed. for pain 60 tablet 1   No current facility-administered medications for this visit.      REVIEW OF SYSTEMS: See HPI for pertinent positives and negatives.  Physical Examination Vitals:   05/16/16 0936 05/16/16 0939  BP: 132/65 110/63  Pulse: 83   Resp: (!) 24   Temp: 98.4 F (36.9 C)   TempSrc: Oral   SpO2: 98%   Weight: 209 lb (94.8 kg)   Height: 5\' 7"  (1.702 m)    Body mass index is 32.73 kg/m.  General:  WDWN obese male in NAD GAIT: normal Eyes: PERRLA Pulmonary:  Non-labored at rest, slightly labored taking off and putting on his socks and shoes, CTAB, no rales,  rhonchi, or wheezing.  Cardiac: Regular rhythm,  no detected murmur.  VASCULAR EXAM Carotid Bruits Right Left   Positive Positive    Aorta is not palpable. Radial pulses are 3+ right and 2+ left palpable  LE Pulses Right Left       FEMORAL  not palpable (obese)  not palpable        POPLITEAL  not palpable   not palpable       POSTERIOR TIBIAL   Not palpable   not palpable        DORSALIS PEDIS      ANTERIOR TIBIAL  not palpable   not palpable     Gastrointestinal: soft, nontender, BS WNL, no r/g,  no palpable masses.  Musculoskeletal: no muscle atrophy/wasting. M/S 5/5 throughout, extremities without ischemic changes  Neurologic: A&O X 3; Appropriate Affect, Speech is normal CN 2-12 intact, pain and light touch intact in extremities, Motor exam as listed above    12/04/13 AAA Duplex at [facility], requested by Dr. Burt Knack: Technically challenging study. Probable stable dimensions of infrarenal AAA, measuring 3.3 cm x  3.6 cm. Normal caliber common and external iliac arteries, bilaterally. Chronic right common iliac artery occlusion. >50% stenosis of the right external and left common iliac arteries. Patent left external iliac artery.        ASSESSMENT:  Fernando Moyer is a 83 y.o. male who is s/p right carotid endarterectomy in May 2012. We've been following a moderate left carotid stenosis.  He has no hx of stroke or TIA. 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 Accurate blood pressures will therefore be in the right arm.  He has bilateral calf claudication after walking about 300 feet, resolves with rest; claudication is improving since he started daily use of his stationary bike and more walking.   According to 2015 aortoiliac duplex, he has known right common iliac artery occlusion and >50% stenosis of the right external and left common iliac arteries, and a small AAA. See Plan.  His atherosclerotic risk factors include currently controlled DM (not on any DM medications), mild CAD, former smoker, obesity, and paroxysmal atrial fib on Eliquis.    DATA Today's carotid duplex suggests a patent right carotid endarterectomy site with no evidence of hyperplasia or restenosis.  Left internal carotid artery velocities suggest a 40-59% stenosis. Right vertebral artery flow is antegrade, left is retrograde. Right subclavian artery waveforms are triphasic, left are biphasic. No significant change in comparison to the last exam on 04-20-15.    Plan: Follow-up in 1 year with Carotid Duplex scan, bilateral aortoiliac duplex, and ABI's.   I discussed in depth with the patient the nature of atherosclerosis, and emphasized the importance of maximal medical management including strict control of blood pressure, blood glucose, and lipid levels, obtaining regular exercise, and cessation of smoking.  The  patient is aware that without maximal medical management the underlying atherosclerotic disease process will progress, limiting the benefit of any interventions. Consideration for repair of AAA would be made when the size approaches 4.8 or 5.0 cm, growth > 1 cm/yr, and symptomatic status. The patient was given information about stroke prevention and what symptoms should prompt the patient to seek immediate medical care. The patient was given information about AAA including signs, symptoms, treatment,  what symptoms should prompt the patient to seek immediate medical care, and how to minimize the risk of enlargement and rupture of aneurysms. The patient was given information about PAD including signs, symptoms, treatment, what symptoms should prompt the patient to seek immediate medical care, and risk reduction measures to take. Thank you for allowing [place] to participate in this patient's  care.  Clemon Chambers, RN, MSN, FNP-C [facility] Office: [phone]  Clinic MD: Scot Dock 05/16/2016 9:44 AM

## 2016-05-18 DIAGNOSIS — G894 Chronic pain syndrome: Secondary | ICD-10-CM | POA: Diagnosis not present

## 2016-05-18 DIAGNOSIS — M5136 Other intervertebral disc degeneration, lumbar region: Secondary | ICD-10-CM | POA: Diagnosis not present

## 2016-05-24 NOTE — Addendum Note (Signed)
Addended by: Lianne Cure A on: 05/24/2016 03:29 PM   Modules accepted: Orders

## 2016-06-05 ENCOUNTER — Other Ambulatory Visit: Payer: Self-pay | Admitting: Internal Medicine

## 2016-06-05 DIAGNOSIS — M5136 Other intervertebral disc degeneration, lumbar region: Secondary | ICD-10-CM | POA: Diagnosis not present

## 2016-07-02 ENCOUNTER — Telehealth: Payer: Self-pay | Admitting: Internal Medicine

## 2016-07-02 ENCOUNTER — Encounter: Payer: Self-pay | Admitting: Internal Medicine

## 2016-07-02 NOTE — Telephone Encounter (Signed)
Unable to reach patient's granddaughter at this time. Left message for a call back when available.

## 2016-07-02 NOTE — Telephone Encounter (Signed)
Please see the patient's granddaughter message. Call and see if he needs to be seen here this week or send him to the ER/urgent care if needed.

## 2016-07-03 ENCOUNTER — Other Ambulatory Visit: Payer: Self-pay | Admitting: Behavioral Health

## 2016-07-03 MED ORDER — TRAMADOL HCL 50 MG PO TABS: 50.0000 mg | ORAL_TABLET | Freq: Two times a day (BID) | ORAL | 1 refills | Status: DC | PRN

## 2016-07-03 NOTE — Telephone Encounter (Signed)
Noted, thx.

## 2016-07-03 NOTE — Telephone Encounter (Signed)
Per the granddaughter Fernando Moyer), she notices that for the past 2 weeks her grandfather's breathing has worsened. She verbalized that it's been an ongoing issue with his breathing for some years now, but it seems to her here recently that he's trying to catch his breath, almost like it hurts to talk. However, she does not report his breathing to be severely difficult, but more like mild to moderate (not struggling for each breath or speaking one word at a time). Currently denies that the patient is confused/disoriented, has slurred speech, loud wheezing, chest pain or palpitations. Per Fernando Moyer, her grandfather is short of breath at rest and occasionally some days have a continuous cough, but none present on yesterday. An appointment has been scheduled for the patient to be seen in office with PCP on 07/04/16 at 11:00 AM. Advised the granddaughter, that if his condition changes or symptoms worsen, seek the nearest emergency department. She verbalized understanding. Prior to call ending, she did request a refill for her grandfather's Tramadol medication. Message routed to Dr. Larose Kells for review.

## 2016-07-03 NOTE — Addendum Note (Signed)
Addended by: Eduard Roux E on: 07/03/2016 02:02 PM   Modules accepted: Orders

## 2016-07-04 ENCOUNTER — Encounter: Payer: Self-pay | Admitting: Internal Medicine

## 2016-07-04 ENCOUNTER — Ambulatory Visit (INDEPENDENT_AMBULATORY_CARE_PROVIDER_SITE_OTHER): Payer: Medicare Other | Admitting: Internal Medicine

## 2016-07-04 ENCOUNTER — Ambulatory Visit (HOSPITAL_BASED_OUTPATIENT_CLINIC_OR_DEPARTMENT_OTHER)
Admission: RE | Admit: 2016-07-04 | Discharge: 2016-07-04 | Disposition: A | Discharge status: Home / Self Care | Payer: Medicare Other | Source: Ambulatory Visit | Attending: Internal Medicine | Admitting: Internal Medicine

## 2016-07-04 VITALS — BP 128/82 | HR 102 | Temp 98.5°F | Resp 16 | Ht 67.0 in | Wt 213.0 lb

## 2016-07-04 DIAGNOSIS — I700 Atherosclerosis of aorta: Secondary | ICD-10-CM | POA: Insufficient documentation

## 2016-07-04 DIAGNOSIS — R0609 Other forms of dyspnea: Secondary | ICD-10-CM | POA: Insufficient documentation

## 2016-07-04 DIAGNOSIS — I745 Embolism and thrombosis of iliac artery: Secondary | ICD-10-CM

## 2016-07-04 DIAGNOSIS — I6522 Occlusion and stenosis of left carotid artery: Secondary | ICD-10-CM | POA: Insufficient documentation

## 2016-07-04 DIAGNOSIS — J984 Other disorders of lung: Secondary | ICD-10-CM | POA: Insufficient documentation

## 2016-07-04 DIAGNOSIS — Z952 Presence of prosthetic heart valve: Secondary | ICD-10-CM | POA: Diagnosis not present

## 2016-07-04 DIAGNOSIS — R0602 Shortness of breath: Secondary | ICD-10-CM | POA: Diagnosis not present

## 2016-07-04 LAB — BASIC METABOLIC PANEL
BUN: 12 mg/dL (ref 6–23)
CALCIUM: 9.1 mg/dL (ref 8.4–10.5)
CO2: 28 meq/L (ref 19–32)
Chloride: 104 mEq/L (ref 96–112)
Creatinine, Ser: 1.02 mg/dL (ref 0.40–1.50)
GFR: 74.5 mL/min (ref >60.00)
Glucose, Bld: 123 mg/dL — ABNORMAL HIGH (ref 70–99)
Potassium: 4.3 mEq/L (ref 3.5–5.1)
SODIUM: 138 meq/L (ref 135–145)

## 2016-07-04 LAB — CBC WITH DIFFERENTIAL/PLATELET
BASOS ABS: 0.2 10*3/uL — AB (ref 0.0–0.1)
Basophils Relative: 1.7 % (ref 0.0–3.0)
Eosinophils Absolute: 0.3 10*3/uL (ref 0.0–0.7)
Eosinophils Relative: 3.5 % (ref 0.0–5.0)
HEMATOCRIT: 30.9 % — AB (ref 39.0–52.0)
HEMOGLOBIN: 9.9 g/dL — AB (ref 13.0–17.0)
LYMPHS PCT: 21.5 % (ref 12.0–46.0)
Lymphs Abs: 1.9 10*3/uL (ref 0.7–4.0)
MCHC: 32.1 g/dL (ref 30.0–36.0)
MCV: 75.4 fl — ABNORMAL LOW (ref 78.0–100.0)
MONOS PCT: 10.3 % (ref 3.0–12.0)
Monocytes Absolute: 0.9 10*3/uL (ref 0.1–1.0)
NEUTROS ABS: 5.5 10*3/uL (ref 1.4–7.7)
Neutrophils Relative %: 63 % (ref 43.0–77.0)
Platelets: 319 10*3/uL (ref 150.0–400.0)
RBC: 4.1 Mil/uL — AB (ref 4.22–5.81)
RDW: 17.3 % — ABNORMAL HIGH (ref 11.5–15.5)
WBC: 8.7 10*3/uL (ref 4.0–10.5)

## 2016-07-04 MED ORDER — GLYCOPYRROLATE-FORMOTEROL 9-4.8 MCG/ACT IN AERO: 1.0000 | INHALATION_SPRAY | Freq: Two times a day (BID) | RESPIRATORY_TRACT | 1 refills | Status: DC

## 2016-07-04 MED FILL — BEVESPI AEROSPHERE INHALER: 9-4.8 | 30 days supply | Qty: 11 | Fill #0

## 2016-07-04 NOTE — Progress Notes (Signed)
Subjective:    Patient ID: Fernando Moyer, male    DOB: 06-08-1933, 83 y.o.   MRN: [id number]  DOS:  07/04/2016 Type of visit - description : Acute visit, here with Melissa Interval history:  Patient was seen 05/08/2016 with a COPD exacerbation, he got better but he has not been back to his previous baseline. The patient and his granddaughter reports more shortness of breath, dyspnea on exertion. He is able to walk only 40-50 yards before he needs to stop due to SOB and also back pain. Cough remains on and off and at baseline. When asked, admits that he has cough up occasional brown sputum and blood ; he has nosebleeds and he is not sure if the blood is coming from the chest or the sinuses.  Also, needs a UDS and contract for tramadol prescription.   Wt Readings from Last 3 Encounters:  07/04/16 213 lb (96.6 kg)  05/16/16 209 lb (94.8 kg)  05/08/16 211 lb 8 oz (95.9 kg)     Review of Systems No fever, some chills. Nosebleeds on and off. Lower extremity edema at baseline. No orthopnea No GERD symptoms, on PPIs. + Wheezing on and off.   Past Medical History:  Diagnosis Date  . AAA (abdominal aortic aneurysm) ([place]) 05/2008   PER CARDIAC CATH 3.5 X 3. 5 CM  . Aortic stenosis 05/2008   PER CARDIAC CATH, MILD, MITRAL STENOSIS  . Back pain    cause unknown but does get Cortisone injection every now and then  . Benign neoplasm of colon   . Bowen's disease    right arm BX: referred to dermatology  . Carotid artery occlusion    last u/s 3-11...Marland Kitchenper vasc.surgery 2007 cath: nonobstructive CAD. mil;d MS, trivial AoS, small AAA,  . Cataract    RIGHT  . Chronic back pain   . COPD (chronic obstructive pulmonary disease) ([place])   . Coronary artery disease 05/2008   MILD, MEDICAL MANAGEMENT  . Diabetes mellitus ([place]) 09/07/2011   no per pt- no medications  . Dyspnea    with exertion only  . GERD with stricture    w/hx of stricture  . Hyperlipidemia    takes Pravastatin daily  .  Mitral stenosis 05/2008   PER CARDIAC CATH  . Myocardial infarction 2012  . Peripheral neuropathy (HCC)    right thigh; "it's been that way for years"  . Pulmonary hypertension   . PVD (peripheral vascular disease) ([place]) 12/07   per cath 12/07....iliac  . Stricture and stenosis of esophagus     Past Surgical History:  Procedure Laterality Date  . AORTIC VALVE REPLACEMENT  08/17/2011   Procedure: AORTIC VALVE REPLACEMENT (AVR);  Surgeon: Gaye Pollack, MD;  Location: [facility];  Service: Open Heart Surgery;  Laterality: N/A;  . APPENDECTOMY    . CARDIAC CATHETERIZATION  02/2011   LAD 50, CFX OK, RCA40, EF 55%; PCWP 17, AoV  0.9 cm squared  . COLONOSCOPY    . ENDARTERECTOMY  08-2010   Right carotid endarterectomy    . ESOPHAGEAL DILATION    . ESOPHAGOGASTRODUODENOSCOPY N/A 10/29/2015   Procedure: ESOPHAGOGASTRODUODENOSCOPY (EGD);  Surgeon: Jerene Bears, MD;  Location: [facility] ENDOSCOPY;  Service: Endoscopy;  Laterality: N/A;  . ESOPHAGOGASTRODUODENOSCOPY (EGD) WITH PROPOFOL N/A 11/10/2015   Procedure: ESOPHAGOGASTRODUODENOSCOPY (EGD) WITH PROPOFOL;  Surgeon: Milus Banister, MD;  Location: [facility] ENDOSCOPY;  Service: Endoscopy;  Laterality: N/A;  . ESOPHAGOGASTRODUODENOSCOPY (EGD) WITH PROPOFOL N/A 11/24/2015   Procedure: ESOPHAGOGASTRODUODENOSCOPY (EGD) WITH  PROPOFOL;  Surgeon: Milus Banister, MD;  Location: [facility] ENDOSCOPY;  Service: Endoscopy;  Laterality: N/A;  dil  . eye lid surgery Bilateral ~ 07-2015  . EYE SURGERY  10/14/13   cataract surgery LEFT eye   . LEFT AND RIGHT HEART CATHETERIZATION WITH CORONARY ANGIOGRAM N/A 02/23/2011   Procedure: LEFT AND RIGHT HEART CATHETERIZATION WITH CORONARY ANGIOGRAM;  Surgeon: [name], MD;  Location: [facility] CATH LAB;  Service: Cardiovascular;  Laterality: N/A;  . STERNAL WIRES REMOVAL N/A 05/01/2013   Procedure: STERNAL WIRES REMOVAL;  Surgeon: Gaye Pollack, MD;  Location: [facility] OR;  Service: Thoracic;  Laterality: N/A;    Social History   Social History    . Marital status: Widowed    Spouse name: N/A  . Number of children: 2  . Years of education: N/A   Occupational History  . 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 Smoking status: Former Smoker    Packs/day: 2.00    Years: 30.00    Types: Cigarettes    Quit date: 04/03/1987  . Smokeless tobacco: Never Used     Comment: quit smoking in 1989  . Alcohol use No  . Drug use: No  . Sexual activity: Not on file   Other Topics Concern  . Not on file   Social History Narrative   LIVES BY HIMSELF    HAS 2 GROWN SONS, 5 G-KIDS    Lenna Sciara is her granddaughter, she is a Art therapist for the school system, usually comes with him for his appointments             Allergies as of 07/04/2016   No Known Allergies     Medication List       Accurate as of 07/04/16 11:59 PM. Always use your most recent med list.          acetaminophen 500 MG tablet Commonly known as:  TYLENOL Take 1,000 mg by mouth every 6 (six) hours as needed (pain). Reported on 04/21/2015   albuterol 108 (90 Base) MCG/ACT inhaler Commonly known as:  VENTOLIN HFA Inhale 2 puffs into the lungs every 6 (six) hours as needed for wheezing or shortness of breath.   ELIQUIS 5 MG Tabs tablet Generic drug:  apixaban TAKE 1 TABLET TWICE A DAY   furosemide 40 MG tablet Commonly known as:  LASIX TAKE 1 TABLET DAILY   Glycopyrrolate-Formoterol 9-4.8 MCG/ACT Aero Commonly known as:  BEVESPI AEROSPHERE Inhale 1 Inhaler into the lungs 2 (two) times daily.   KLOR-CON M20 20 MEQ tablet Generic drug:  potassium chloride SA TAKE 1 TABLET DAILY   meclizine 25 MG tablet Commonly known as:  ANTIVERT TAKE 1 TABLET 3 TIMES A DAY AS NEEDED FOR DIZZINESS   omeprazole 40 MG capsule Commonly known as:  PRILOSEC Take 40 mg by mouth 2 (two) times daily.   pravastatin 40 MG tablet Commonly known as:  PRAVACHOL Take 1 tablet (40 mg total)  by mouth daily.   tiotropium 18 MCG inhalation capsule Commonly known as:  SPIRIVA HANDIHALER Place 1 capsule (18 mcg total) into inhaler and inhale daily.   traMADol 50 MG tablet Commonly known as:  ULTRAM Take 1 tablet (50 mg total) by mouth every 12 (twelve) hours as needed. for pain          Objective:   Physical Exam BP 128/82 (BP Location:  Left Arm, Patient Position: Sitting, Cuff Size: Normal)   Pulse (!) 102   Temp 98.5 F (36.9 C) (Oral)   Resp 16   Ht 5\' 7"  (1.702 m)   Wt 213 lb (96.6 kg)   SpO2 93%   BMI 33.36 kg/m  General:   Well developed, well nourished . NAD.  HEENT:  Normocephalic . Face symmetric, atraumatic. TMs normal, nose not congested. Question of a slight increased JVD. No supraclavicular mass  Lungs:  Decreased breath sounds throughout, increased expiratory time, no crackles or actual wheezes. No increased work of breathing. Normal respiratory effort, no intercostal retractions, no accessory muscle use. Heart: Seems regular today with occasional extra beat,  + systolic murmur.  Trace pretibial edema bilaterally  Abdomen:  Not distended, soft, non-tender. No rebound or rigidity.  Skin: Not pale. Not jaundice Neurologic:  alert & oriented X3.  Speech normal, gait appropriate for age and unassisted Psych--  Cognition and judgment appear intact.  Cooperative with normal attention span and concentration.  Behavior appropriate. No anxious or depressed appearing.    Assessment & Plan:    Assessment DM  w/ neuropathy (feet burning, nl exam) COPD Hyperlipidemia GERD with stricture, EGD x 3 ~ July 2017  CV: --CAD,  Last myoview 2014 --Carotid artery disease-- R CEA 2012 --A. Fib-- paroxysmal - eliquis  --Aortic valve replacement (bio) --PVD: AAA, iliAortic valve, subclavian artery stenosis  --FU:XNATF open heart surgery 2013. CT chest 2014 no PE, gallbladder stones. MSK-- back pain, sees Dr Nelva Bush, tramadol per PCP skin cancer  PLAN    DOE: Presents with increased difficulty breathing from baseline, has a history of COPD, CAD. Has chronic chest pain at baseline. Exam decreased breath sounds, question of increased JVD. Weight is relatively stable, has gained 4 pounds. O2 sat did not drop after exertion in the office. Will increase his COPD treatment, increase Lasix temporarily, reassess in 2 weeks Plan:  BMP, CBC, chest x-ray. Change Spiriva to [place].  For the next  weeks, take 2 Lasix instead of one on every other day. Addendum: Blood work show definitely decreasing hemoglobin with iron deficiency. We are sending him to GI, no extra Lasix as recommended yesterday. Will send a message to cardiology, hold Eliquis? Back pain: UDS and contract for tramadol Come back in 3 weeks, call anytime if he is worse.

## 2016-07-04 NOTE — Progress Notes (Signed)
Pre visit review using our clinic review tool, if applicable. No additional management support is needed unless otherwise documented below in the visit note. 

## 2016-07-04 NOTE — Patient Instructions (Addendum)
GO TO THE LAB : Get the blood work     GO TO THE FRONT DESK Schedule your next appointment for a  checkup in 3 weeks   STOP BY THE FIRST FLOOR:  get the XR    Stop Spiriva, start BEVESPI 2 puffs twice a day   Take 2 tablets of Lasix tomorrow, Saturday and Monday.

## 2016-07-05 ENCOUNTER — Other Ambulatory Visit (INDEPENDENT_AMBULATORY_CARE_PROVIDER_SITE_OTHER): Payer: Medicare Other

## 2016-07-05 ENCOUNTER — Telehealth: Payer: Self-pay | Admitting: Internal Medicine

## 2016-07-05 DIAGNOSIS — D509 Iron deficiency anemia, unspecified: Secondary | ICD-10-CM

## 2016-07-05 DIAGNOSIS — D649 Anemia, unspecified: Secondary | ICD-10-CM

## 2016-07-05 LAB — FERRITIN: Ferritin: 8.6 ng/mL — ABNORMAL LOW (ref 22.0–322.0)

## 2016-07-05 LAB — IRON: IRON: 24 ug/dL — AB (ref 42–165)

## 2016-07-05 MED ORDER — FERROUS SULFATE 325 (65 FE) MG PO TABS: 325.0000 mg | ORAL_TABLET | Freq: Two times a day (BID) | ORAL | 3 refills | Status: DC

## 2016-07-05 NOTE — Telephone Encounter (Signed)
LMOM for Fernando Moyer informing of lab results, chest x-ray results and recommendations. Rx sent to [facility], GI referral placed, and instructed her to call if questions/concerns.

## 2016-07-05 NOTE — Assessment & Plan Note (Signed)
decreased breath sounds, question of increased JVD. Weight is relatively stable, has gained 4 pounds. O2 sat did not drop after exertion in the office. Will increase his COPD treatment, increase Lasix temporarily, reassess in 2 weeks Plan:  BMP, CBC, chest x-ray. Change Spiriva to Anoro.  For the next  weeks, take 2 Lasix instead of one on every other day. Addendum: Blood work show definitely decreasing hemoglobin with iron deficiency. We are sending him to GI, no extra Lasix as recommended yesterday. Will send a message to cardiology, hold Eliquis? Back pain: UDS and contract for tramadol Come back in 3 weeks, call anytime if he is worse.

## 2016-07-05 NOTE — Telephone Encounter (Signed)
Please contact Fernando Moyer (or the patient) Labs show anemia, iron deficiency, that likely explain the more difficulty breathing. Chest x-ray was okay. Plan: GI referral due to anemia Start iron 325 mg 1 by mouth twice a day #60, 3 refills No need to do the extra Lasix as recommended yesterday.

## 2016-07-10 DIAGNOSIS — H25811 Combined forms of age-related cataract, right eye: Secondary | ICD-10-CM | POA: Diagnosis not present

## 2016-07-10 DIAGNOSIS — H02002 Unspecified entropion of right lower eyelid: Secondary | ICD-10-CM | POA: Diagnosis not present

## 2016-07-10 DIAGNOSIS — H26492 Other secondary cataract, left eye: Secondary | ICD-10-CM | POA: Diagnosis not present

## 2016-07-10 DIAGNOSIS — H04123 Dry eye syndrome of bilateral lacrimal glands: Secondary | ICD-10-CM | POA: Diagnosis not present

## 2016-07-10 DIAGNOSIS — H02001 Unspecified entropion of right upper eyelid: Secondary | ICD-10-CM | POA: Diagnosis not present

## 2016-07-10 DIAGNOSIS — H524 Presbyopia: Secondary | ICD-10-CM | POA: Diagnosis not present

## 2016-07-24 DIAGNOSIS — M9905 Segmental and somatic dysfunction of pelvic region: Secondary | ICD-10-CM | POA: Diagnosis not present

## 2016-07-24 DIAGNOSIS — M5136 Other intervertebral disc degeneration, lumbar region: Secondary | ICD-10-CM | POA: Diagnosis not present

## 2016-07-24 DIAGNOSIS — Q72812 Congenital shortening of left lower limb: Secondary | ICD-10-CM | POA: Diagnosis not present

## 2016-07-24 DIAGNOSIS — M9903 Segmental and somatic dysfunction of lumbar region: Secondary | ICD-10-CM | POA: Diagnosis not present

## 2016-07-24 DIAGNOSIS — M9904 Segmental and somatic dysfunction of sacral region: Secondary | ICD-10-CM | POA: Diagnosis not present

## 2016-07-26 ENCOUNTER — Encounter: Payer: Self-pay | Admitting: Nurse Practitioner

## 2016-07-26 ENCOUNTER — Other Ambulatory Visit (INDEPENDENT_AMBULATORY_CARE_PROVIDER_SITE_OTHER): Payer: Medicare Other

## 2016-07-26 ENCOUNTER — Telehealth: Payer: Self-pay | Admitting: Cardiovascular Disease

## 2016-07-26 ENCOUNTER — Ambulatory Visit (INDEPENDENT_AMBULATORY_CARE_PROVIDER_SITE_OTHER): Payer: Medicare Other | Admitting: Nurse Practitioner

## 2016-07-26 VITALS — BP 140/58 | HR 93 | Ht 67.0 in | Wt 212.0 lb

## 2016-07-26 DIAGNOSIS — K922 Gastrointestinal hemorrhage, unspecified: Secondary | ICD-10-CM

## 2016-07-26 DIAGNOSIS — Z7901 Long term (current) use of anticoagulants: Secondary | ICD-10-CM | POA: Diagnosis not present

## 2016-07-26 DIAGNOSIS — I745 Embolism and thrombosis of iliac artery: Secondary | ICD-10-CM | POA: Diagnosis not present

## 2016-07-26 DIAGNOSIS — D509 Iron deficiency anemia, unspecified: Secondary | ICD-10-CM | POA: Diagnosis not present

## 2016-07-26 LAB — CBC WITH DIFFERENTIAL/PLATELET
BASOS ABS: 0.2 10*3/uL — AB (ref 0.0–0.1)
Basophils Relative: 2.9 % (ref 0.0–3.0)
EOS PCT: 2.7 % (ref 0.0–5.0)
Eosinophils Absolute: 0.2 10*3/uL (ref 0.0–0.7)
HCT: 37.7 % — ABNORMAL LOW (ref 39.0–52.0)
HEMOGLOBIN: 12.2 g/dL — AB (ref 13.0–17.0)
Lymphocytes Relative: 17.3 % (ref 12.0–46.0)
Lymphs Abs: 1.3 10*3/uL (ref 0.7–4.0)
MCHC: 32.4 g/dL (ref 30.0–36.0)
MCV: 82.4 fl (ref 78.0–100.0)
MONO ABS: 0.6 10*3/uL (ref 0.1–1.0)
Monocytes Relative: 7.9 % (ref 3.0–12.0)
Neutro Abs: 5.2 10*3/uL (ref 1.4–7.7)
Neutrophils Relative %: 69.2 % (ref 43.0–77.0)
Platelets: 259 10*3/uL (ref 150.0–400.0)
RBC: 4.57 Mil/uL (ref 4.22–5.81)
RDW: 26.4 % — ABNORMAL HIGH (ref 11.5–15.5)
WBC: 7.5 10*3/uL (ref 4.0–10.5)

## 2016-07-26 LAB — BASIC METABOLIC PANEL
BUN: 12 mg/dL (ref 6–23)
CO2: 27 mEq/L (ref 19–32)
Calcium: 9.2 mg/dL (ref 8.4–10.5)
Chloride: 104 mEq/L (ref 96–112)
Creatinine, Ser: 1.12 mg/dL (ref 0.40–1.50)
GFR: 66.87 mL/min (ref >60.00)
Glucose, Bld: 147 mg/dL — ABNORMAL HIGH (ref 70–99)
POTASSIUM: 4 meq/L (ref 3.5–5.1)
Sodium: 139 mEq/L (ref 135–145)

## 2016-07-26 NOTE — Telephone Encounter (Signed)
New Message   Fernando Moyer is calling from [employer] and she verbalized that she is calling for rn or to speak with Dr.Cooper  I informed Fernando Moyer that RN wants me to take a message and I asked for more information:  Pt is actually bleeding and pt is on eliquis and make sure that pt dosent need to be admitted

## 2016-07-26 NOTE — Telephone Encounter (Signed)
Pt with GI bleeding, planned endoscopy early next week. He will hold Eliquis until endoscopy done.

## 2016-07-26 NOTE — Telephone Encounter (Signed)
Dr Burt Knack contacted GI and advised okay for pt to hold Eliquis.

## 2016-07-26 NOTE — Patient Instructions (Addendum)
Your physician has requested that you go to the basement for lab work before leaving today.  Hold your Eliquis until further notice.  Continue your Prilosec twice daily.   You have been scheduled for an endoscopy. Please follow written instructions given to you at your visit today. If you use inhalers (even only as needed), please bring them with you on the day of your procedure.

## 2016-07-26 NOTE — Progress Notes (Signed)
HPI: Patient is an 83 year old male with COPD, DM 2 , HTN, AFib, aortic valve disease, status post bioprosthetic aortic valve replacement 2013. here for evaluation of anemia. Hemoglobin 13.07 October 2015, down to 9.9 earlier this month. MCV only 75. Ferritin 8.6 Patient has been on Eliquis for 4 years. He recently started ferrous sulfate and stools now black.  No black stools before iron. No abdominal pain. No nausea. No unexpected weight loss. Patient does not take NSAIDs, uses Tylenol for pain.He complains of fatigue. Patient known to Dr. Ardis Hughs, he has a history of esophageal strictures, s/p balloon dilation in Aug 2017. He has a hx of adenomatous colon polyps as well but no longer under surveillance   Past Medical History:  Diagnosis Date  . AAA (abdominal aortic aneurysm) ([place]) 05/2008   PER CARDIAC CATH 3.5 X 3. 5 CM  . Aortic stenosis 05/2008   PER CARDIAC CATH, MILD, MITRAL STENOSIS  . Back pain    cause unknown but does get Cortisone injection every now and then  . Benign neoplasm of colon   . Bowen's disease    right arm BX: referred to dermatology  . Carotid artery occlusion    last u/s 3-11...[facility] Kitchenper vasc.surgery 2007 cath: nonobstructive CAD. mil;d MS, trivial AoS, small AAA,  . Cataract    RIGHT  . Chronic back pain   . COPD (chronic obstructive pulmonary disease) ([place])   . Coronary artery disease 05/2008   MILD, MEDICAL MANAGEMENT  . Diabetes mellitus ([place]) 09/07/2011   no per pt- no medications  . Dyspnea    with exertion only  . GERD with stricture    w/hx of stricture  . Hyperlipidemia    takes Pravastatin daily  . Mitral stenosis 05/2008   PER CARDIAC CATH  . Myocardial infarction ([place]) 2012  . Peripheral neuropathy    right thigh; "it's been that way for years"  . Pulmonary hypertension ([place])   . PVD (peripheral vascular disease) ([place]) 12/07   per cath 12/07....iliac  . Stricture and stenosis of esophagus     Patient's surgical history, family medical  history, social history, medications and allergies were all reviewed in Epic    Physical Exam: BP (!) 140/58   Pulse 93   Ht 5\' 7"  (1.702 m)   Wt 212 lb (96.2 kg)   BMI 33.20 kg/m   GENERAL: Well developed white male in NAD PSYCH: :Pleasant, cooperative, normal affect HEENT:  conjunctiva pink, mucous membranes moist, neck supple without masses CARDIAC:  RRR, +murmur heard PULM: Normal respiratory effort, lungs CTA bilaterally, no wheezing ABDOMEN:  soft, nontender, nondistended, no obvious masses, no hepatomegaly,  normal bowel sounds Rectal: Dark brown, grossly heme positive stool  SKIN:  turgor, no lesions seen Musculoskeletal: Normal muscle tone, normal strength NEURO: Alert and oriented x 3, no focal neurologic deficits  ASSESSMENT and PLAN:  1. 83 yo male year old with new multiple medical problems presenting with new microcytic anemia, heme positive stools on Eliquis. Hgb 9.9, down from baseline of mid 13.  Stools black since starting iron. No abdominal pain, nausea, bowel changes etc..  -Stop Eliquis, I spoke to Cardiologist- Dr. Burt Knack and he agrees -continue BID PPI -continue iron -Will begin evaluation with EGD. Procedure to be done by Dr. Loletha Carrow on Tuesday. The risks and benefits of EGD were discussed and the patient agrees to proceed. Will obtain repeat CBC today, if hemoglobin has continued to decline then patient may need to be admitted for  blood and inpatient EGD. If hgb stable then continue with above plan.  -will need to make a decision about resumption of Eliquis following EGD.    2. History of colorectal polyps, tubular adenoma 2015. Surveillance was not recommended  3. Aortic valve disease, s/p bioprosthetic valve replacement.   4. Afib, rate controlled. On Eliquis.   5. COPD, not on home 02  6. CAD. EF 65-70%  Cc: [name], MD  Tye Savoy , NP 07/26/2016, 3:03 PM

## 2016-07-27 ENCOUNTER — Ambulatory Visit: Payer: Medicare Other | Admitting: Internal Medicine

## 2016-07-27 DIAGNOSIS — M9905 Segmental and somatic dysfunction of pelvic region: Secondary | ICD-10-CM | POA: Diagnosis not present

## 2016-07-27 DIAGNOSIS — Q72812 Congenital shortening of left lower limb: Secondary | ICD-10-CM | POA: Diagnosis not present

## 2016-07-27 DIAGNOSIS — M9903 Segmental and somatic dysfunction of lumbar region: Secondary | ICD-10-CM | POA: Diagnosis not present

## 2016-07-27 DIAGNOSIS — M5136 Other intervertebral disc degeneration, lumbar region: Secondary | ICD-10-CM | POA: Diagnosis not present

## 2016-07-27 DIAGNOSIS — M9904 Segmental and somatic dysfunction of sacral region: Secondary | ICD-10-CM | POA: Diagnosis not present

## 2016-07-27 NOTE — Progress Notes (Signed)
Patient of yours staffed out by me in clinic yesterday.  PG put him on with me for EGD next week b/c I had an opening.

## 2016-07-27 NOTE — Progress Notes (Signed)
Thank you for sending this case to me, and for discussing it with me in clinic yesterday. I have reviewed the entire note, and the outlined plan is what we discussed. If EGD unrevealing, I will discuss with Dr Ardis Hughs scheduling the patient for either colonoscopy or capsule study shotly thereafter.   Wilfrid Lund, MD

## 2016-07-30 DIAGNOSIS — M9905 Segmental and somatic dysfunction of pelvic region: Secondary | ICD-10-CM | POA: Diagnosis not present

## 2016-07-30 DIAGNOSIS — Q72812 Congenital shortening of left lower limb: Secondary | ICD-10-CM | POA: Diagnosis not present

## 2016-07-30 DIAGNOSIS — M9904 Segmental and somatic dysfunction of sacral region: Secondary | ICD-10-CM | POA: Diagnosis not present

## 2016-07-30 DIAGNOSIS — M9903 Segmental and somatic dysfunction of lumbar region: Secondary | ICD-10-CM | POA: Diagnosis not present

## 2016-07-30 DIAGNOSIS — M5136 Other intervertebral disc degeneration, lumbar region: Secondary | ICD-10-CM | POA: Diagnosis not present

## 2016-07-30 NOTE — Progress Notes (Signed)
I agree with the above note, plan 

## 2016-07-31 ENCOUNTER — Ambulatory Visit (AMBULATORY_SURGERY_CENTER): Payer: Medicare Other | Admitting: Gastroenterology

## 2016-07-31 ENCOUNTER — Encounter: Payer: Self-pay | Admitting: Gastroenterology

## 2016-07-31 VITALS — BP 136/80 | HR 87 | Temp 99.3°F | Resp 13 | Ht 67.0 in | Wt 212.0 lb

## 2016-07-31 DIAGNOSIS — D500 Iron deficiency anemia secondary to blood loss (chronic): Secondary | ICD-10-CM

## 2016-07-31 DIAGNOSIS — K922 Gastrointestinal hemorrhage, unspecified: Secondary | ICD-10-CM | POA: Diagnosis not present

## 2016-07-31 DIAGNOSIS — Z85820 Personal history of malignant melanoma of skin: Secondary | ICD-10-CM | POA: Diagnosis not present

## 2016-07-31 DIAGNOSIS — L570 Actinic keratosis: Secondary | ICD-10-CM | POA: Diagnosis not present

## 2016-07-31 DIAGNOSIS — Z08 Encounter for follow-up examination after completed treatment for malignant neoplasm: Secondary | ICD-10-CM | POA: Diagnosis not present

## 2016-07-31 MED ORDER — SODIUM CHLORIDE 0.9 % IV SOLN: 500.0000 mL | INTRAVENOUS | Status: DC

## 2016-07-31 NOTE — Progress Notes (Signed)
Spontaneous respirations throughout. VSS. Resting comfortably. To PACU on room air. Report to  Celia RN. 

## 2016-07-31 NOTE — Op Note (Signed)
[facility] Patient Name: Fernando Moyer Procedure Date: 07/31/2016 4:45 PM MRN: [id number] Endoscopist: Mallie Mussel L. Loletha Carrow , MD Age: 83 Referring MD:  Date of Birth: [birth year] Gender: Male Account #: [id number] Procedure:                Upper GI endoscopy Indications:              Iron deficiency anemia Medicines:                Monitored Anesthesia Care Procedure:                Pre-Anesthesia Assessment:                           - Prior to the procedure, a History and Physical                            was performed, and patient medications and                            allergies were reviewed. The patient's tolerance of                            previous anesthesia was also reviewed. The risks                            and benefits of the procedure and the sedation                            options and risks were discussed with the patient.                            All questions were answered, and informed consent                            was obtained. Prior Anticoagulants: The patient has                            taken Eliquis (apixaban), last dose was 5 days                            prior to procedure. ASA Grade Assessment: III - A                            patient with severe systemic disease. After                            reviewing the risks and benefits, the patient was                            deemed in satisfactory condition to undergo the                            procedure.  After obtaining informed consent, the endoscope was                            passed under direct vision. Throughout the                            procedure, the patient's blood pressure, pulse, and                            oxygen saturations were monitored continuously. The                            Endoscope was introduced through the mouth, and                            advanced to the second part of duodenum. The upper        GI endoscopy was accomplished without difficulty.                            The patient tolerated the procedure well. Scope In: Scope Out: Findings:                 The larynx was normal.                           One mild benign-appearing, intrinsic stenosis was                            found at the gastroesophageal junction. This                            measured 1.4 cm (inner diameter) and was traversed.                           A medium-sized (6 cm) hiatal hernia was present. No                            Cameron erosions were present in the hernia.                           The stomach was normal.                           The cardia and gastric fundus were normal on                            retroflexion.                           The examined duodenum was normal. Complications:            No immediate complications. Estimated Blood Loss:     Estimated blood loss: none. Impression:               - Normal larynx.                           -  Benign-appearing esophageal stenosis.                           - Medium-sized hiatal hernia.                           - Normal stomach.                           - Normal examined duodenum.                           - No specimens collected.                           This patient reports frequent and severe nosebleeds                            for several months (though none in last several                            days while off [place]).                           ENT evaluation is in order.                           The patient's PCP will receive a copy of this                            report and a message regarding this issue.                           Hemoglobin went from 9.9 -> 12.2 while on iron                            tablets for three weeks. Recommendation:           - Patient has a contact number available for                            emergencies. The signs and symptoms of potential                             delayed complications were discussed with the                            patient. Return to normal activities tomorrow.                            Written discharge instructions were provided to the                            patient.                           - Resume previous diet.  Continue other medications.                           Continue iron tablets at same dose for the next 4                            weeks, then have blood counts checked periodically                            by primary care.                           - Resume Eliquis (apixaban) at prior dose today.                           - Return to primary care physician at the next                            available appointment. Azhar Yogi L. Loletha Carrow, MD 07/31/2016 5:13:52 PM This report has been signed electronically.

## 2016-07-31 NOTE — Patient Instructions (Signed)
Discharge instructions given. Normal exam. Resume previous medications. Resume Eliquis at prior dose today. YOU HAD AN ENDOSCOPIC PROCEDURE TODAY AT [facility]:   Refer to the procedure report that was given to you for any specific questions about what was found during the examination.  If the procedure report does not answer your questions, please call your gastroenterologist to clarify.  If you requested that your care partner not be given the details of your procedure findings, then the procedure report has been included in a sealed envelope for you to review at your convenience later.  YOU SHOULD EXPECT: Some feelings of bloating in the abdomen. Passage of more gas than usual.  Walking can help get rid of the air that was put into your GI tract during the procedure and reduce the bloating. If you had a lower endoscopy (such as a colonoscopy or flexible sigmoidoscopy) you may notice spotting of blood in your stool or on the toilet paper. If you underwent a bowel prep for your procedure, you may not have a normal bowel movement for a few days.  Please Note:  You might notice some irritation and congestion in your nose or some drainage.  This is from the oxygen used during your procedure.  There is no need for concern and it should clear up in a day or so.  SYMPTOMS TO REPORT IMMEDIATELY:    Following upper endoscopy (EGD)  Vomiting of blood or coffee ground material  New chest pain or pain under the shoulder blades  Painful or persistently difficult swallowing  New shortness of breath  Fever of 100F or higher  Black, tarry-looking stools  For urgent or emergent issues, a gastroenterologist can be reached at any hour by calling [phone].   DIET:  We do recommend a small meal at first, but then you may proceed to your regular diet.  Drink plenty of fluids but you should avoid alcoholic beverages for 24 hours.  ACTIVITY:  You should plan to take it easy for the rest  of today and you should NOT DRIVE or use heavy machinery until tomorrow (because of the sedation medicines used during the test).    FOLLOW UP: Our staff will call the number listed on your records the next business day following your procedure to check on you and address any questions or concerns that you may have regarding the information given to you following your procedure. If we do not reach you, we will leave a message.  However, if you are feeling well and you are not experiencing any problems, there is no need to return our call.  We will assume that you have returned to your regular daily activities without incident.  If any biopsies were taken you will be contacted by phone or by letter within the next 1-3 weeks.  Please call us at [phone] if you have not heard about the biopsies in 3 weeks.    SIGNATURES/CONFIDENTIALITY: You and/or your care partner have signed paperwork which will be entered into your electronic medical record.  These signatures attest to the fact that that the information above on your After Visit Summary has been reviewed and is understood.  Full responsibility of the confidentiality of this discharge information lies with you and/or your care-partner.

## 2016-08-01 ENCOUNTER — Telehealth: Payer: Self-pay | Admitting: *Deleted

## 2016-08-01 DIAGNOSIS — M9905 Segmental and somatic dysfunction of pelvic region: Secondary | ICD-10-CM | POA: Diagnosis not present

## 2016-08-01 DIAGNOSIS — M5136 Other intervertebral disc degeneration, lumbar region: Secondary | ICD-10-CM | POA: Diagnosis not present

## 2016-08-01 DIAGNOSIS — M9903 Segmental and somatic dysfunction of lumbar region: Secondary | ICD-10-CM | POA: Diagnosis not present

## 2016-08-01 DIAGNOSIS — M9904 Segmental and somatic dysfunction of sacral region: Secondary | ICD-10-CM | POA: Diagnosis not present

## 2016-08-01 DIAGNOSIS — Q72812 Congenital shortening of left lower limb: Secondary | ICD-10-CM | POA: Diagnosis not present

## 2016-08-01 NOTE — Telephone Encounter (Signed)
No answer left message and will call back again this afternoon for follow up. Sm

## 2016-08-01 NOTE — Telephone Encounter (Signed)
Called patient's number provided in Admitting ,no answer, unable to leave a message(no voicemail box). Also called patient's home #, no answer, message left for the patient.

## 2016-08-02 ENCOUNTER — Telehealth: Payer: Self-pay | Admitting: Internal Medicine

## 2016-08-02 DIAGNOSIS — M9905 Segmental and somatic dysfunction of pelvic region: Secondary | ICD-10-CM | POA: Diagnosis not present

## 2016-08-02 DIAGNOSIS — M9904 Segmental and somatic dysfunction of sacral region: Secondary | ICD-10-CM | POA: Diagnosis not present

## 2016-08-02 DIAGNOSIS — D649 Anemia, unspecified: Secondary | ICD-10-CM

## 2016-08-02 DIAGNOSIS — Q72812 Congenital shortening of left lower limb: Secondary | ICD-10-CM | POA: Diagnosis not present

## 2016-08-02 DIAGNOSIS — R040 Epistaxis: Secondary | ICD-10-CM

## 2016-08-02 DIAGNOSIS — M5136 Other intervertebral disc degeneration, lumbar region: Secondary | ICD-10-CM | POA: Diagnosis not present

## 2016-08-02 DIAGNOSIS — M9903 Segmental and somatic dysfunction of lumbar region: Secondary | ICD-10-CM | POA: Diagnosis not present

## 2016-08-02 NOTE — Telephone Encounter (Signed)
See EGD report, essentially normal, hemoglobin improving with iron supplements, he reported to GI he is having nosebleeds and GI recommend a ENT eval. Please enter a ENT referral, dx  nosebleeds, anemia. Advise patient to continue with iron and normal medications including ELIQUIS (please put it back on the med list) Please be sure he has a follow-up with me in the next few weeks. ====  EGD report: - Normal larynx. - Benign-appearing esophageal stenosis. - Medium-sized hiatal hernia. - Normal stomach. - Normal examined duodenum. - No specimens collected. This patient reports frequent and severe nosebleeds for several months (though none in last several days while off [place]). ENT evaluation is in order.

## 2016-08-02 NOTE — Telephone Encounter (Signed)
ENT referral placed. Eliquis has already been added back to list. Pt has 3 week f/u scheduled tomorrow w/ PCP.

## 2016-08-03 ENCOUNTER — Ambulatory Visit (INDEPENDENT_AMBULATORY_CARE_PROVIDER_SITE_OTHER): Payer: Medicare Other | Admitting: Internal Medicine

## 2016-08-03 ENCOUNTER — Encounter: Payer: Self-pay | Admitting: Internal Medicine

## 2016-08-03 VITALS — BP 128/84 | HR 83 | Temp 98.4°F | Resp 16 | Ht 67.0 in | Wt 213.0 lb

## 2016-08-03 DIAGNOSIS — I745 Embolism and thrombosis of iliac artery: Secondary | ICD-10-CM

## 2016-08-03 DIAGNOSIS — I25810 Atherosclerosis of coronary artery bypass graft(s) without angina pectoris: Secondary | ICD-10-CM

## 2016-08-03 DIAGNOSIS — E1159 Type 2 diabetes mellitus with other circulatory complications: Secondary | ICD-10-CM

## 2016-08-03 DIAGNOSIS — D649 Anemia, unspecified: Secondary | ICD-10-CM

## 2016-08-03 DIAGNOSIS — R0609 Other forms of dyspnea: Secondary | ICD-10-CM | POA: Diagnosis not present

## 2016-08-03 DIAGNOSIS — R5383 Other fatigue: Secondary | ICD-10-CM

## 2016-08-03 LAB — CBC WITH DIFFERENTIAL/PLATELET
BASOS PCT: 1.3 % (ref 0.0–3.0)
Basophils Absolute: 0.1 10*3/uL (ref 0.0–0.1)
EOS PCT: 2.6 % (ref 0.0–5.0)
Eosinophils Absolute: 0.2 10*3/uL (ref 0.0–0.7)
HEMATOCRIT: 41.4 % (ref 39.0–52.0)
HEMOGLOBIN: 13.3 g/dL (ref 13.0–17.0)
LYMPHS ABS: 1.3 10*3/uL (ref 0.7–4.0)
LYMPHS PCT: 17.2 % (ref 12.0–46.0)
MCHC: 32.1 g/dL (ref 30.0–36.0)
MCV: 84.3 fl (ref 78.0–100.0)
MONOS PCT: 7 % (ref 3.0–12.0)
Monocytes Absolute: 0.5 10*3/uL (ref 0.1–1.0)
Neutro Abs: 5.5 10*3/uL (ref 1.4–7.7)
Neutrophils Relative %: 71.9 % (ref 43.0–77.0)
Platelets: 228 10*3/uL (ref 150.0–400.0)
RBC: 4.9 Mil/uL (ref 4.22–5.81)
RDW: 25.1 % — ABNORMAL HIGH (ref 11.5–15.5)
WBC: 7.6 10*3/uL (ref 4.0–10.5)

## 2016-08-03 LAB — VITAMIN B12: Vitamin B-12: 250 pg/mL (ref 211–911)

## 2016-08-03 LAB — TSH: TSH: 4.39 u[IU]/mL (ref 0.35–4.50)

## 2016-08-03 LAB — HEMOGLOBIN A1C: Hgb A1c MFr Bld: 5.8 % (ref 4.6–6.5)

## 2016-08-03 LAB — FOLATE: Folate: 17.9 ng/mL (ref >5.9)

## 2016-08-03 MED ORDER — FERROUS SULFATE 325 (65 FE) MG PO TABS: 325.0000 mg | ORAL_TABLET | Freq: Two times a day (BID) | ORAL | 0 refills | Status: DC

## 2016-08-03 MED ORDER — FERROUS SULFATE 325 (65 FE) MG PO TABS: 325.0000 mg | ORAL_TABLET | Freq: Two times a day (BID) | ORAL | 0 refills | Status: AC

## 2016-08-03 NOTE — Progress Notes (Signed)
Subjective:    Patient ID: Fernando Moyer, male    DOB: 08/27/1933, 83 y.o.   MRN: [id number]  DOS:  08/03/2016 Type of visit - description : f/u Interval history: Was seen with DOE above from baseline, labs showed significant anemia, was a started on iron, saw GI, had a EGD. All  records reviewed. Here for follow-up   Wt Readings from Last 3 Encounters:  08/03/16 213 lb (96.6 kg)  07/31/16 212 lb (96.2 kg)  07/26/16 212 lb (96.2 kg)     Review of Systems Doing about the same, DOE has not changed. He feels weak in general. Today he reports nosebleeds, on and off for the last 6-8 weeks, none for the last 4 days. Is hard for the pt to quantify the bleeding but sometimes is "a lot". Always from the left nostril. Denies any sinus congestion. No chest pain Respiratory status is reviewed, has minimal cough, good compliance with inhalers. Denies nausea, vomiting, diarrhea. No abdominal pain. Stools become black after he started iron.  Past Medical History:  Diagnosis Date  . AAA (abdominal aortic aneurysm) ([place]) 05/2008   PER CARDIAC CATH 3.5 X 3. 5 CM  . Aortic stenosis 05/2008   PER CARDIAC CATH, MILD, MITRAL STENOSIS  . Back pain    cause unknown but does get Cortisone injection every now and then  . Benign neoplasm of colon   . Bowen's disease    right arm BX: referred to dermatology  . Carotid artery occlusion    last u/s 3-11...Marland Kitchenper vasc.surgery 2007 cath: nonobstructive CAD. mil;d MS, trivial AoS, small AAA,  . Cataract    RIGHT removed  . Chronic back pain   . COPD (chronic obstructive pulmonary disease) ([place])   . Coronary artery disease 05/2008   MILD, MEDICAL MANAGEMENT  . Diabetes mellitus ([place]) 09/07/2011   no per pt- no medications  . Dyspnea    with exertion only  . GERD with stricture    w/hx of stricture  . Hyperlipidemia    takes Pravastatin daily  . Mitral stenosis 05/2008   PER CARDIAC CATH  . Myocardial infarction ([place]) 2012  . Peripheral neuropathy    right thigh; "it's been that way for years"  . Pulmonary hypertension ([place])   . PVD (peripheral vascular disease) ([place]) 12/07   per cath 12/07....iliac  . Stricture and stenosis of esophagus     Past Surgical History:  Procedure Laterality Date  . AORTIC VALVE REPLACEMENT  08/17/2011   Procedure: AORTIC VALVE REPLACEMENT (AVR);  Surgeon: Gaye Pollack, MD;  Location: [facility];  Service: Open Heart Surgery;  Laterality: N/A;  . APPENDECTOMY    . CARDIAC CATHETERIZATION  02/2011   LAD 50, CFX OK, RCA40, EF 55%; PCWP 17, AoV  0.9 cm squared  . COLONOSCOPY    . ENDARTERECTOMY  08-2010   Right carotid endarterectomy    . ESOPHAGEAL DILATION    . ESOPHAGOGASTRODUODENOSCOPY N/A 10/29/2015   Procedure: ESOPHAGOGASTRODUODENOSCOPY (EGD);  Surgeon: Jerene Bears, MD;  Location[facility] ENDOSCOPY;  Service: Endoscopy;  Laterality: N/A;  . ESOPHAGOGASTRODUODENOSCOPY (EGD) WITH PROPOFOL N/A 11/10/2015   Procedure: ESOPHAGOGASTRODUODENOSCOPY (EGD) WITH PROPOFOL;  Surgeon: Milus Banister, MD;  Location: [facility] ENDOSCOPY;  Service: Endoscopy;  Laterality: N/A;  . ESOPHAGOGASTRODUODENOSCOPY (EGD) WITH PROPOFOL N/A 11/24/2015   Procedure: ESOPHAGOGASTRODUODENOSCOPY (EGD) WITH PROPOFOL;  Surgeon: Milus Banister, MD;  Location: [facility] ENDOSCOPY;  Service: Endoscopy;  Laterality: N/A;  dil  . eye lid surgery Bilateral ~ 07-2015  .  EYE SURGERY  10/14/13   cataract surgery LEFT eye   . LEFT AND RIGHT HEART CATHETERIZATION WITH CORONARY ANGIOGRAM N/A 02/23/2011   Procedure: LEFT AND RIGHT HEART CATHETERIZATION WITH CORONARY ANGIOGRAM;  Surgeon: [name], MD;  Location: [facility] CATH LAB;  Service: Cardiovascular;  Laterality: N/A;  . STERNAL WIRES REMOVAL N/A 05/01/2013   Procedure: STERNAL WIRES REMOVAL;  Surgeon: Gaye Pollack, MD;  Location: [facility] OR;  Service: Thoracic;  Laterality: N/A;  . UPPER GASTROINTESTINAL ENDOSCOPY      Social History   Social History  . Marital status: Widowed    Spouse name: N/A  . Number of  children: 2  . Years of education: N/A   Occupational History  . 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 Smoking status: Former Smoker    Packs/day: 2.00    Years: 30.00    Types: Cigarettes    Quit date: 04/03/1987  . Smokeless tobacco: Never Used     Comment: quit smoking in 1989  . Alcohol use No  . Drug use: No  . Sexual activity: Not on file   Other Topics Concern  . Not on file   Social History Narrative   LIVES BY HIMSELF    HAS 2 GROWN SONS, 5 G-KIDS    Lenna Sciara is her granddaughter, she is a Art therapist for the school system, usually comes with him for his appointments             Allergies as of 08/03/2016   No Known Allergies     Medication List       Accurate as of 08/03/16 11:59 PM. Always use your most recent med list.          acetaminophen 500 MG tablet Commonly known as:  TYLENOL Take 1,000 mg by mouth every 6 (six) hours as needed (pain). Reported on 04/21/2015   albuterol 108 (90 Base) MCG/ACT inhaler Commonly known as:  VENTOLIN HFA Inhale 2 puffs into the lungs every 6 (six) hours as needed for wheezing or shortness of breath.   ELIQUIS 5 MG Tabs tablet Generic drug:  apixaban TAKE 1 TABLET TWICE A DAY   ferrous sulfate 325 (65 FE) MG tablet Commonly known as:  FERROUSUL Take 1 tablet (325 mg total) by mouth 2 (two) times daily with a meal.   furosemide 40 MG tablet Commonly known as:  LASIX TAKE 1 TABLET DAILY   Glycopyrrolate-Formoterol 9-4.8 MCG/ACT Aero Commonly known as:  BEVESPI AEROSPHERE Inhale 1 Inhaler into the lungs 2 (two) times daily.   KLOR-CON M20 20 MEQ tablet Generic drug:  potassium chloride SA TAKE 1 TABLET DAILY   meclizine 25 MG tablet Commonly known as:  ANTIVERT TAKE 1 TABLET 3 TIMES A DAY AS NEEDED FOR DIZZINESS   omeprazole 40 MG capsule Commonly known as:  PRILOSEC Take 40 mg by mouth 2 (two) times daily.     pravastatin 40 MG tablet Commonly known as:  PRAVACHOL Take 1 tablet (40 mg total) by mouth daily.   traMADol 50 MG tablet Commonly known as:  ULTRAM Take 1 tablet (50 mg total) by mouth every 12 (twelve) hours as needed. for pain          Objective:   Physical Exam BP 128/84 (BP Location: Left Arm, Patient Position: Sitting, Cuff Size: Normal)   Pulse 83   Temp 98.4 F (  36.9 C) (Oral)   Resp 16   Ht 5\' 7"  (1.702 m)   Wt 213 lb (96.6 kg)   SpO2 97%   BMI 33.36 kg/m  General:   Well developed, well nourished . NAD.  HEENT:  Normocephalic . Face symmetric, atraumatic Lungs:  CTA B Normal respiratory effort, no intercostal retractions, no accessory muscle use. Heart: Seems regular w/a extra beat, + systolic murmur.  trace pretibial edema bilaterally  Abdomen:  Not distended, soft, non-tender. No rebound or rigidity.  Skin: Not pale. Not jaundice Neurologic:  alert & oriented X3.  Speech normal, gait appropriate for age and unassisted Psych--  Cognition and judgment appear intact.  Cooperative with normal attention span and concentration.  Behavior appropriate. No anxious or depressed appearing.    Assessment & Plan:    Assessment DM  w/ neuropathy (feet burning, nl exam) COPD Hyperlipidemia GERD with stricture, EGD x 3 ~ July 2017  CV: --CAD,  Last myoview 2014 --Carotid artery disease-- R CEA 2012 --A. Fib-- paroxysmal - eliquis  --Aortic valve replacement (bio) --PVD: AAA, iliAortic valve, subclavian artery stenosis  --XN:ATFTD open heart surgery 2013. CT chest 2014 no PE, gallbladder stones. MSK-- back pain, sees Dr Nelva Bush, tramadol per PCP skin cancer  PLAN  DOE, now also c/o fatigue: See last visit, he was found to have anemia (hemoglobin: 9.9), other labs were satisfactory, chest x-ray showed no CHF. I felt sxs were due to anemia, he started iron, saw GI, had a EGD which was essentially unremarkable. At the time, he reported nosebleeds and GI  recommended to continue PPIs and get a ENT eval. Patient hemoglobin increased to 12.2. Despite increasing  hemoglobin, the patient continue with DOE abovebaseline and feeling weak. Respiratory sxs are well-controlled with only occasional cough, good compliance with inhalers. O2 sat 97% EKG today sinus rhythm, PACs, at baseline. Etiology of increased DOE-fatigue unclear. + h/o aortic valve disease. Plan: CBC, TSH, D22, folic acid.  ENT referral.  Arrange a cardiology visit, DOE cardiac? Due to valvular disease?Marland Kitchen  Check a echocardiogram.  Anemia: See above, continue iron supplements. DM, check A1c   RTC 2 months . ER if symptoms severe

## 2016-08-03 NOTE — Patient Instructions (Signed)
GO TO THE LAB : Get the blood work     GO TO THE FRONT DESK Schedule your next appointment for a  routine checkup in 2 months

## 2016-08-03 NOTE — Progress Notes (Signed)
Pre visit review using our clinic review tool, if applicable. No additional management support is needed unless otherwise documented below in the visit note. 

## 2016-08-04 NOTE — Assessment & Plan Note (Signed)
DOE, now also c/o fatigue: See last visit, he was found to have anemia (hemoglobin: 9.9), other labs were satisfactory, chest x-ray showed no CHF. I felt sxs were due to anemia, he started iron, saw GI, had a EGD which was essentially unremarkable. At the time, he reported nosebleeds and GI recommended to continue PPIs and get a ENT eval. Patient hemoglobin increased to 12.2. Despite increasing  hemoglobin, the patient continue with DOE abovebaseline and feeling weak. Respiratory sxs are well-controlled with only occasional cough, good compliance with inhalers. O2 sat 97% EKG today sinus rhythm, PACs, at baseline. Etiology of increased DOE-fatigue unclear. + h/o aortic valve disease. Plan: CBC, TSH, Y81, folic acid.  ENT referral.  Arrange a cardiology visit, DOE cardiac? Due to valvular disease?Marland Kitchen  Check a echocardiogram.  Anemia: See above, continue iron supplements. DM, check A1c   RTC 2 months . ER if symptoms severe

## 2016-08-06 DIAGNOSIS — M9903 Segmental and somatic dysfunction of lumbar region: Secondary | ICD-10-CM | POA: Diagnosis not present

## 2016-08-06 DIAGNOSIS — M5136 Other intervertebral disc degeneration, lumbar region: Secondary | ICD-10-CM | POA: Diagnosis not present

## 2016-08-06 DIAGNOSIS — Q72812 Congenital shortening of left lower limb: Secondary | ICD-10-CM | POA: Diagnosis not present

## 2016-08-06 DIAGNOSIS — M9904 Segmental and somatic dysfunction of sacral region: Secondary | ICD-10-CM | POA: Diagnosis not present

## 2016-08-06 DIAGNOSIS — M9905 Segmental and somatic dysfunction of pelvic region: Secondary | ICD-10-CM | POA: Diagnosis not present

## 2016-08-07 ENCOUNTER — Telehealth (HOSPITAL_COMMUNITY): Payer: Self-pay | Admitting: Internal Medicine

## 2016-08-07 DIAGNOSIS — Q72812 Congenital shortening of left lower limb: Secondary | ICD-10-CM | POA: Diagnosis not present

## 2016-08-07 DIAGNOSIS — M9905 Segmental and somatic dysfunction of pelvic region: Secondary | ICD-10-CM | POA: Diagnosis not present

## 2016-08-07 DIAGNOSIS — M9904 Segmental and somatic dysfunction of sacral region: Secondary | ICD-10-CM | POA: Diagnosis not present

## 2016-08-07 DIAGNOSIS — M5136 Other intervertebral disc degeneration, lumbar region: Secondary | ICD-10-CM | POA: Diagnosis not present

## 2016-08-07 DIAGNOSIS — M9903 Segmental and somatic dysfunction of lumbar region: Secondary | ICD-10-CM | POA: Diagnosis not present

## 2016-08-09 DIAGNOSIS — M9903 Segmental and somatic dysfunction of lumbar region: Secondary | ICD-10-CM | POA: Diagnosis not present

## 2016-08-09 DIAGNOSIS — M9905 Segmental and somatic dysfunction of pelvic region: Secondary | ICD-10-CM | POA: Diagnosis not present

## 2016-08-09 DIAGNOSIS — M9904 Segmental and somatic dysfunction of sacral region: Secondary | ICD-10-CM | POA: Diagnosis not present

## 2016-08-09 DIAGNOSIS — M5136 Other intervertebral disc degeneration, lumbar region: Secondary | ICD-10-CM | POA: Diagnosis not present

## 2016-08-09 DIAGNOSIS — Q72812 Congenital shortening of left lower limb: Secondary | ICD-10-CM | POA: Diagnosis not present

## 2016-08-14 DIAGNOSIS — M9904 Segmental and somatic dysfunction of sacral region: Secondary | ICD-10-CM | POA: Diagnosis not present

## 2016-08-14 DIAGNOSIS — M9905 Segmental and somatic dysfunction of pelvic region: Secondary | ICD-10-CM | POA: Diagnosis not present

## 2016-08-14 DIAGNOSIS — M5136 Other intervertebral disc degeneration, lumbar region: Secondary | ICD-10-CM | POA: Diagnosis not present

## 2016-08-14 DIAGNOSIS — M9903 Segmental and somatic dysfunction of lumbar region: Secondary | ICD-10-CM | POA: Diagnosis not present

## 2016-08-14 DIAGNOSIS — Q72812 Congenital shortening of left lower limb: Secondary | ICD-10-CM | POA: Diagnosis not present

## 2016-08-15 ENCOUNTER — Telehealth (HOSPITAL_COMMUNITY): Payer: Self-pay | Admitting: Internal Medicine

## 2016-08-15 ENCOUNTER — Telehealth: Payer: Self-pay | Admitting: Internal Medicine

## 2016-08-15 NOTE — Telephone Encounter (Signed)
Caller name: Rollene Fare Relationship to patient: Can be reached: [phone] Pharmacy:  Reason for call: Please [facility] @ [address] needs call back to clarify order

## 2016-08-15 NOTE — Telephone Encounter (Signed)
Please call them, let me know what they need to clarify.

## 2016-08-15 NOTE — Telephone Encounter (Signed)
I contacted Dr. Ethel Rana office in regards to a voice mail I received from patient's daughter Lenna Sciara. I had called patient on 5/11 and 5/14 to get patient scheduled for an echo. She called wanting to confirm patient's appt on 5/23. The patient's appointment on 5/23 is to see a Cardiologist. I called Dr. Ethel Rana office to get clarify on whether the patient needed the echo prior to the Cardiology appt or if it needed to wait. I spoke with Langley Gauss and she voiced that she would send a note to the nurses to research and call me back.

## 2016-08-16 DIAGNOSIS — M5136 Other intervertebral disc degeneration, lumbar region: Secondary | ICD-10-CM | POA: Diagnosis not present

## 2016-08-16 DIAGNOSIS — Q72812 Congenital shortening of left lower limb: Secondary | ICD-10-CM | POA: Diagnosis not present

## 2016-08-16 DIAGNOSIS — M9905 Segmental and somatic dysfunction of pelvic region: Secondary | ICD-10-CM | POA: Diagnosis not present

## 2016-08-16 DIAGNOSIS — M9903 Segmental and somatic dysfunction of lumbar region: Secondary | ICD-10-CM | POA: Diagnosis not present

## 2016-08-16 DIAGNOSIS — M9904 Segmental and somatic dysfunction of sacral region: Secondary | ICD-10-CM | POA: Diagnosis not present

## 2016-08-16 NOTE — Telephone Encounter (Signed)
Spoke with Rollene Fare on 05/17/218 at approximately 11:05am. To clarify Dr. Ethel Rana instructions, the Patient is to have an echocardiogram prior to his appointment with Cardiology on 08/22/2016.

## 2016-08-17 NOTE — Telephone Encounter (Signed)
08/07/2016 11:19 AM Phone (Outgoing) Encarnacion, [place] (Self) [phone] (H)   Left Message - Called pt and lmsg for him to CB to get scheduled for an echo.     By Cherie Dark A    08/10/2016 09:47 AM Phone ([address] Jamiel, Goncalves (Self) [phone] Jerilynn Mages)   Left Message - Called pt and lmsg for him to CB to get scheduled for echo.     By Cherie Dark A    08/13/2016 03:22 PM Phone (Incoming) Nasiir, Monts (Self) [phone] Jerilynn Mages)   Called pt and lmsg for him to CB to get scheduled for echo.     By Verdene Rio

## 2016-08-20 DIAGNOSIS — M9904 Segmental and somatic dysfunction of sacral region: Secondary | ICD-10-CM | POA: Diagnosis not present

## 2016-08-20 DIAGNOSIS — M9905 Segmental and somatic dysfunction of pelvic region: Secondary | ICD-10-CM | POA: Diagnosis not present

## 2016-08-20 DIAGNOSIS — Q72812 Congenital shortening of left lower limb: Secondary | ICD-10-CM | POA: Diagnosis not present

## 2016-08-20 DIAGNOSIS — M5136 Other intervertebral disc degeneration, lumbar region: Secondary | ICD-10-CM | POA: Diagnosis not present

## 2016-08-20 DIAGNOSIS — M9903 Segmental and somatic dysfunction of lumbar region: Secondary | ICD-10-CM | POA: Diagnosis not present

## 2016-08-21 ENCOUNTER — Ambulatory Visit: Payer: Medicare Other | Admitting: Internal Medicine

## 2016-08-22 ENCOUNTER — Ambulatory Visit: Payer: Medicare Other | Admitting: Physician Assistant

## 2016-08-23 DIAGNOSIS — M9905 Segmental and somatic dysfunction of pelvic region: Secondary | ICD-10-CM | POA: Diagnosis not present

## 2016-08-23 DIAGNOSIS — M5136 Other intervertebral disc degeneration, lumbar region: Secondary | ICD-10-CM | POA: Diagnosis not present

## 2016-08-23 DIAGNOSIS — Q72812 Congenital shortening of left lower limb: Secondary | ICD-10-CM | POA: Diagnosis not present

## 2016-08-23 DIAGNOSIS — M9903 Segmental and somatic dysfunction of lumbar region: Secondary | ICD-10-CM | POA: Diagnosis not present

## 2016-08-23 DIAGNOSIS — M9904 Segmental and somatic dysfunction of sacral region: Secondary | ICD-10-CM | POA: Diagnosis not present

## 2016-08-28 DIAGNOSIS — M9905 Segmental and somatic dysfunction of pelvic region: Secondary | ICD-10-CM | POA: Diagnosis not present

## 2016-08-28 DIAGNOSIS — M9904 Segmental and somatic dysfunction of sacral region: Secondary | ICD-10-CM | POA: Diagnosis not present

## 2016-08-28 DIAGNOSIS — Q72812 Congenital shortening of left lower limb: Secondary | ICD-10-CM | POA: Diagnosis not present

## 2016-08-28 DIAGNOSIS — M9903 Segmental and somatic dysfunction of lumbar region: Secondary | ICD-10-CM | POA: Diagnosis not present

## 2016-08-28 DIAGNOSIS — M5136 Other intervertebral disc degeneration, lumbar region: Secondary | ICD-10-CM | POA: Diagnosis not present

## 2016-08-29 DIAGNOSIS — J342 Deviated nasal septum: Secondary | ICD-10-CM | POA: Diagnosis not present

## 2016-08-29 DIAGNOSIS — R1310 Dysphagia, unspecified: Secondary | ICD-10-CM | POA: Diagnosis not present

## 2016-08-29 DIAGNOSIS — J019 Acute sinusitis, unspecified: Secondary | ICD-10-CM | POA: Diagnosis not present

## 2016-08-29 DIAGNOSIS — R040 Epistaxis: Secondary | ICD-10-CM | POA: Diagnosis not present

## 2016-08-29 DIAGNOSIS — Z87891 Personal history of nicotine dependence: Secondary | ICD-10-CM | POA: Diagnosis not present

## 2016-08-29 DIAGNOSIS — R490 Dysphonia: Secondary | ICD-10-CM | POA: Diagnosis not present

## 2016-08-29 HISTORY — PX: LARYNGOSCOPY: SUR817

## 2016-08-30 ENCOUNTER — Ambulatory Visit (HOSPITAL_COMMUNITY): Payer: Medicare Other | Attending: Internal Medicine

## 2016-08-30 ENCOUNTER — Other Ambulatory Visit: Payer: Self-pay

## 2016-08-30 DIAGNOSIS — E785 Hyperlipidemia, unspecified: Secondary | ICD-10-CM | POA: Diagnosis not present

## 2016-08-30 DIAGNOSIS — I272 Pulmonary hypertension, unspecified: Secondary | ICD-10-CM | POA: Insufficient documentation

## 2016-08-30 DIAGNOSIS — J439 Emphysema, unspecified: Secondary | ICD-10-CM | POA: Insufficient documentation

## 2016-08-30 DIAGNOSIS — Q72812 Congenital shortening of left lower limb: Secondary | ICD-10-CM | POA: Diagnosis not present

## 2016-08-30 DIAGNOSIS — I714 Abdominal aortic aneurysm, without rupture: Secondary | ICD-10-CM | POA: Diagnosis not present

## 2016-08-30 DIAGNOSIS — Z85828 Personal history of other malignant neoplasm of skin: Secondary | ICD-10-CM | POA: Diagnosis not present

## 2016-08-30 DIAGNOSIS — M9905 Segmental and somatic dysfunction of pelvic region: Secondary | ICD-10-CM | POA: Diagnosis not present

## 2016-08-30 DIAGNOSIS — Z952 Presence of prosthetic heart valve: Secondary | ICD-10-CM | POA: Insufficient documentation

## 2016-08-30 DIAGNOSIS — I739 Peripheral vascular disease, unspecified: Secondary | ICD-10-CM | POA: Diagnosis not present

## 2016-08-30 DIAGNOSIS — I25810 Atherosclerosis of coronary artery bypass graft(s) without angina pectoris: Secondary | ICD-10-CM | POA: Diagnosis not present

## 2016-08-30 DIAGNOSIS — I4891 Unspecified atrial fibrillation: Secondary | ICD-10-CM | POA: Diagnosis not present

## 2016-08-30 DIAGNOSIS — M5136 Other intervertebral disc degeneration, lumbar region: Secondary | ICD-10-CM | POA: Diagnosis not present

## 2016-08-30 DIAGNOSIS — R0609 Other forms of dyspnea: Secondary | ICD-10-CM | POA: Insufficient documentation

## 2016-08-30 DIAGNOSIS — M9904 Segmental and somatic dysfunction of sacral region: Secondary | ICD-10-CM | POA: Diagnosis not present

## 2016-08-30 DIAGNOSIS — I081 Rheumatic disorders of both mitral and tricuspid valves: Secondary | ICD-10-CM | POA: Insufficient documentation

## 2016-08-30 DIAGNOSIS — I4892 Unspecified atrial flutter: Secondary | ICD-10-CM | POA: Insufficient documentation

## 2016-08-30 DIAGNOSIS — M9903 Segmental and somatic dysfunction of lumbar region: Secondary | ICD-10-CM | POA: Diagnosis not present

## 2016-08-30 DIAGNOSIS — Z87891 Personal history of nicotine dependence: Secondary | ICD-10-CM | POA: Diagnosis not present

## 2016-08-30 MED ORDER — PERFLUTREN LIPID MICROSPHERE
1.0000 mL | INTRAVENOUS | Status: AC | PRN
  Administered 2016-08-30: 2 mL via INTRAVENOUS

## 2016-09-03 ENCOUNTER — Encounter: Payer: Self-pay | Admitting: Physician Assistant

## 2016-09-03 ENCOUNTER — Ambulatory Visit (INDEPENDENT_AMBULATORY_CARE_PROVIDER_SITE_OTHER): Payer: Medicare Other | Admitting: Physician Assistant

## 2016-09-03 VITALS — BP 198/60 | HR 64 | Ht 67.0 in | Wt 208.4 lb

## 2016-09-03 DIAGNOSIS — R030 Elevated blood-pressure reading, without diagnosis of hypertension: Secondary | ICD-10-CM

## 2016-09-03 DIAGNOSIS — I745 Embolism and thrombosis of iliac artery: Secondary | ICD-10-CM

## 2016-09-03 DIAGNOSIS — R0602 Shortness of breath: Secondary | ICD-10-CM | POA: Diagnosis not present

## 2016-09-03 DIAGNOSIS — I714 Abdominal aortic aneurysm, without rupture, unspecified: Secondary | ICD-10-CM

## 2016-09-03 DIAGNOSIS — I779 Disorder of arteries and arterioles, unspecified: Secondary | ICD-10-CM

## 2016-09-03 DIAGNOSIS — I350 Nonrheumatic aortic (valve) stenosis: Secondary | ICD-10-CM

## 2016-09-03 DIAGNOSIS — I25810 Atherosclerosis of coronary artery bypass graft(s) without angina pectoris: Secondary | ICD-10-CM

## 2016-09-03 DIAGNOSIS — I739 Peripheral vascular disease, unspecified: Secondary | ICD-10-CM

## 2016-09-03 DIAGNOSIS — I480 Paroxysmal atrial fibrillation: Secondary | ICD-10-CM | POA: Diagnosis not present

## 2016-09-03 NOTE — Patient Instructions (Signed)
Your physician recommends that you continue on your current medications as directed. Please refer to the Current Medication list given to you today.  Your physician recommends that you return for lab work in:  [place] has requested that you have a [place]. For further information please visit [email or web address]. Please follow instruction sheet, as given.  Your physician recommends that you schedule a follow-up appointment in:  [place]

## 2016-09-03 NOTE — Progress Notes (Signed)
Cardiology Office Note:    Date:  09/03/2016   ID:  Fernando Moyer, DOB June 25, 1933, MRN [id number]  PCP:  Fernando Branch, MD  Cardiologist:  Dr. Sherren Moyer    Referring MD: Fernando Branch, MD   Chief Complaint  Patient presents with  . Shortness of Breath    History of Present Illness:    Fernando Moyer is a 83 y.o. male with a hx of aortic stenosis status post bioprosthetic AVR in 2013, CAD, paroxysmal AF, HTN, carotid artery disease status post right CEA, AAA. He is on Apixaban for anticoagulation.  He has a history of chronic multifactorial dyspnea related to chronic back pain and sedentary lifestyle. Last seen by Dr. Burt Moyer 09/2015. He was recently seen by primary care in 5/18 with complaints of fatigue and worsening dyspnea with exertion. He was noted to be anemic and was seen by GI. Records indicate that EGD was unremarkable. Hemoglobin has improved over time on Iron. He did see ENT for epistaxis. His epistaxis has since resolved. Follow-up echocardiogram demonstrated worsening gradients across the aortic valve, normal LV function. He has been asked to follow-up with cardiology for further assessment.  Fernando Moyer is here alone today. He was under the impression he was seeing Dr. Burt Moyer today. He had questions about his echocardiogram that was recently performed. The patient notes chronic dyspnea since his valve surgery. He also has had chronic chest pain since his valve surgery. He's had some sternal wires removed in the past. He notes significant fatigue. He feels that his breathing has worsened over the past several months. His chest pain is unchanged. He denies syncope, orthopnea, PND or edema. He has had black stools since starting on iron.  Prior CV studies:   The following studies were reviewed today:  Echo 08/30/16 Narrow LVOT, vigorous LVF, EF 65-70, aortic valve mean gradient 23 mmHg (peak 43 mmHg), MAC, moderate MR, severe LAE, mildly reduced RVSF, PASP 50  Carotid US  05/2016 R CEA patent; L 40-59; known L subclavian steal  Echo 12/2013 Moderate concentric LVH, vigorous LVF, EF 65-70, normal wall motion, AVR okay (mean 15, peak 34), moderate MAC, mild LAE  Abd AAA [place] 12/2013 Probable stable dimensions of infrarenal AAA, measuring 3.3 cm x 3.6 cm. Normal caliber common and external iliac arteries, bilaterally. Chronic right common iliac artery occlusion. >50% stenosis of the right external and left common iliac arteries. Patent left external iliac artery. f/u 1 year  Myoview 10/2012 No ischemia, EF 62  Chest CTA 10/2012 IMPRESSION: Stable appearance of abdominal aortic aneurysm at 3.7 cm AP diameter.  Diffuse atherosclerotic change throughout the abdominal aorta and major abdominal and pelvic vessels.  Moderate stenosis of the infrarenal abdominal aorta.  Moderate stenosis of the right common iliac artery.  Ulcerated plaque formation in the left common iliac artery origin.  Cholelithiasis.  Fat containing umbilical hernia.  LHC 05/2008 LM irregularities LCx irregularities LAD mid 50, D1 proximal 70 RCA proximal 30-40, distal 50 EF 60 Renal arteries patent bilaterally  Past Medical History:  Diagnosis Date  . AAA (abdominal aortic aneurysm) ([place]) 05/2008   PER CARDIAC CATH 3.5 X 3. 5 CM  . Aortic stenosis 05/2008   PER CARDIAC CATH, MILD, MITRAL STENOSIS  . Back pain    cause unknown but does get Cortisone injection every now and then  . Benign neoplasm of Fernando   . Bowen's disease    right arm BX: referred to dermatology  . Carotid artery occlusion  last u/s 3-11...Marland Kitchenper vasc.surgery 2007 cath: nonobstructive CAD. mil;d MS, trivial AoS, small AAA,  . Cataract    RIGHT removed  . Chronic back pain   . COPD (chronic obstructive pulmonary disease) ([place])   . Coronary artery disease 05/2008   MILD, MEDICAL MANAGEMENT  . Diabetes mellitus ([place]) 09/07/2011   no per pt- no medications  . Dyspnea    with exertion only  . GERD with stricture     w/hx of stricture  . Hyperlipidemia    takes Pravastatin daily  . Mitral stenosis 05/2008   PER CARDIAC CATH  . Myocardial infarction ([place]) 2012  . Peripheral neuropathy    right thigh; "it's been that way for years"  . Pulmonary hypertension ([place])   . PVD (peripheral vascular disease) ([place]) 12/07   per cath 12/07....iliac  . Stricture and stenosis of esophagus     Past Surgical History:  Procedure Laterality Date  . AORTIC VALVE REPLACEMENT  08/17/2011   Procedure: AORTIC VALVE REPLACEMENT (AVR);  Surgeon: Gaye Pollack, MD;  Location: [facility];  Service: Open Heart Surgery;  Laterality: N/A;  . APPENDECTOMY    . CARDIAC CATHETERIZATION  02/2011   LAD 50, CFX OK, RCA40, EF 55%; PCWP 17, AoV  0.9 cm squared  . ENDARTERECTOMY  08-2010   Right carotid endarterectomy    . ESOPHAGEAL DILATION    . ESOPHAGOGASTRODUODENOSCOPY N/A 10/29/2015   Procedure: ESOPHAGOGASTRODUODENOSCOPY (EGD);  Surgeon: Jerene Bears, MD;  Location[facility] ENDOSCOPY;  Service: Endoscopy;  Laterality: N/A;  . ESOPHAGOGASTRODUODENOSCOPY (EGD) WITH PROPOFOL N/A 11/10/2015   Procedure: ESOPHAGOGASTRODUODENOSCOPY (EGD) WITH PROPOFOL;  Surgeon: Milus Banister, MD;  Location: [facility] ENDOSCOPY;  Service: Endoscopy;  Laterality: N/A;  . ESOPHAGOGASTRODUODENOSCOPY (EGD) WITH PROPOFOL N/A 11/24/2015   Procedure: ESOPHAGOGASTRODUODENOSCOPY (EGD) WITH PROPOFOL;  Surgeon: Milus Banister, MD;  Location: [facility] ENDOSCOPY;  Service: Endoscopy;  Laterality: N/A;  dil  . eye lid surgery Bilateral ~ 07-2015  . EYE SURGERY  10/14/13   cataract surgery LEFT eye   . LARYNGOSCOPY  08/29/2016   transnasal  . LEFT AND RIGHT HEART CATHETERIZATION WITH CORONARY ANGIOGRAM N/A 02/23/2011   Procedure: LEFT AND RIGHT HEART CATHETERIZATION WITH CORONARY ANGIOGRAM;  Surgeon: Fernando Mocha, MD;  Location: [facility] CATH LAB;  Service: Cardiovascular;  Laterality: N/A;  . STERNAL WIRES REMOVAL N/A 05/01/2013   Procedure: STERNAL WIRES REMOVAL;  Surgeon: Gaye Pollack, MD;  Location: [facility] OR;  Service: Thoracic;  Laterality: N/A;    Current Medications: Current Meds  Medication Sig  . acetaminophen (TYLENOL) 500 MG tablet Take 1,000 mg by mouth every 6 (six) hours as needed (pain). Reported on 04/21/2015  . ELIQUIS 5 MG TABS tablet TAKE 1 TABLET TWICE A DAY  . ferrous sulfate (FERROUSUL) 325 (65 FE) MG tablet Take 1 tablet (325 mg total) by mouth 2 (two) times daily with a meal.  . furosemide (LASIX) 40 MG tablet TAKE 1 TABLET DAILY  . Glycopyrrolate-Formoterol (BEVESPI AEROSPHERE) 9-4.8 MCG/ACT AERO Inhale 1 Inhaler into the lungs 2 (two) times daily.  Marland Kitchen KLOR-CON M20 20 MEQ tablet TAKE 1 TABLET DAILY  . meclizine (ANTIVERT) 25 MG tablet TAKE 1 TABLET 3 TIMES A DAY AS NEEDED FOR DIZZINESS  . omeprazole (PRILOSEC) 40 MG capsule Take 40 mg by mouth 2 (two) times daily.  . pravastatin (PRAVACHOL) 40 MG tablet Take 1 tablet (40 mg total) by mouth daily.  . traMADol (ULTRAM) 50 MG tablet Take 1 tablet (50 mg total) by mouth every  12 (twelve) hours as needed. for pain   Current Facility-Administered Medications for the 09/03/16 encounter (Office Visit) with Richardson Dopp T, PA-C  Medication  . 0.9 %  sodium chloride infusion     Allergies:   Patient has no known allergies.   Social History   Social History  . Marital status: Widowed    Spouse name: N/A  . Number of children: 2  . Years of education: N/A   Occupational History  . 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 Smoking status: Former Smoker    Packs/day: 2.00    Years: 30.00    Types: Cigarettes    Quit date: 04/03/1987  . Smokeless tobacco: Never Used     Comment: quit smoking in 1989  . Alcohol use No  . Drug use: No  . Sexual activity: Not Asked   Other Topics Concern  . None   Social History Narrative   LIVES BY HIMSELF    HAS 2 GROWN SONS, 5 G-KIDS    Melissa is her granddaughter, she  is a Art therapist for the school system, usually comes with him for his appointments            Family Hx: The patient's family history includes Heart attack (age of onset: 46) in his father; Prostate cancer (age of onset: 80) in his brother. There is no history of Fernando cancer, Anesthesia problems, Hypotension, Malignant hyperthermia, Pseudochol deficiency, Stomach cancer, Esophageal cancer, Rectal cancer, or Pancreatic cancer.  ROS:   Please see the history of present illness.    Review of Systems  Constitution: Positive for malaise/fatigue.  HENT: Positive for hearing loss.   Cardiovascular: Positive for dyspnea on exertion.  Musculoskeletal: Positive for back pain, joint pain and myalgias.  Gastrointestinal: Positive for hematochezia.   All other systems reviewed and are negative.   EKGs/Labs/Other Test Reviewed:    EKG:  EKG is not ordered today.  The ekg ordered today demonstrates n/a   Recent Labs: 07/26/2016: BUN 12; Creatinine, Ser 1.12; Potassium 4.0; Sodium 139 08/03/2016: Hemoglobin 13.3; Platelets 228.0 Platelet estimate appears normal.; TSH 4.39   Recent Lipid Panel Lab Results  Component Value Date/Time   CHOL 126 08/25/2015 07:57 AM   TRIG 201.0 (H) 08/25/2015 07:57 AM   TRIG 96 03/20/2006 09:45 AM   HDL 24.00 (L) 08/25/2015 07:57 AM   CHOLHDL 5 08/25/2015 07:57 AM   LDLCALC 78 06/17/2014 09:33 AM   LDLDIRECT 67.0 08/25/2015 07:57 AM    Physical Exam:    VS:  BP (!) 198/60   Pulse 64   Ht _0  (1.702 m)   Wt 208 lb 6.4 oz (94.5 kg)   BMI 32.64 kg/m     Wt Readings from Last 3 Encounters:  09/03/16 208 lb 6.4 oz (94.5 kg)  08/03/16 213 lb (96.6 kg)  07/31/16 212 lb (96.2 kg)     Physical Exam  Constitutional: He is oriented to person, place, and time. He appears well-developed and well-nourished. No distress.  HENT:  Head: Normocephalic and atraumatic.  Eyes: No scleral icterus.  Neck: Normal range of motion. No JVD present.   Cardiovascular: Normal rate, regular rhythm, S1 normal and S2 normal.   Murmur heard.  Low-pitched systolic murmur is present with a grade of 2/6  at the upper right sternal border  Holosystolic murmur of grade 2/6 is also  present at the lower left sternal border. Pulmonary/Chest: Effort normal and breath sounds normal. He has no wheezes. He has no rhonchi. He has no rales.  Abdominal: Soft. There is no tenderness.  Musculoskeletal: He exhibits no edema.  Neurological: He is alert and oriented to person, place, and time.  Skin: Skin is warm and dry.  Psychiatric: He has a normal mood and affect.    ASSESSMENT:    1. Shortness of breath   2. Aortic valve stenosis, etiology of cardiac valve disease unspecified   3. Coronary artery disease involving coronary bypass graft of native heart without angina pectoris   4. PAF (paroxysmal atrial fibrillation) ([place])   5. Bilateral carotid artery disease ([place])   6. AAA (abdominal aortic aneurysm) without rupture ([place])   7. Blood pressure elevated without history of HTN    PLAN:    In order of problems listed above:  1. Shortness of breath -  He has had chronic shortness of breath that has been felt to be multifactorial. He is fairly sedentary and limited by back pain. I reviewed his recent echocardiogram with Dr. Burt Moyer. His gradients have increased but are not severe enough to cause his current symptoms. It has been 4 years since his last assessment for ischemia.  -  Arrange nuclear stress test to rule out ischemia  2. Aortic valve stenosis, etiology of cardiac valve disease unspecified -  Status post bioprosthetic AVR in 2013. Recent echocardiogram with increased mean gradient of 23. This is not severe enough to cause his current symptoms. We will need to continue to monitor his bioprosthetic aortic valve stenosis with a repeat echocardiogram in approximately 12 months.  Continue SBE prophylaxis.  3. Coronary artery disease involving coronary  bypass graft of native heart without angina pectoris - Moderate nonobstructive disease noted by cardiac catheterization in 2010. Low risk Myoview in 2014. He denies any change in his chronic chest pain. His breathing has gotten worse. Proceed with stress testing as outlined above. He is not on aspirin as he is on Eliquis. Continue statin.  4. PAF (paroxysmal atrial fibrillation) ([place]) - Recent ECG with PCP demonstrated normal sinus rhythm. Continue Eliquis for anticoagulation.  5. Bilateral carotid artery disease ([place]) - Status post right CEA. Carotid Dopplers are followed by vascular surgery.  6. AAA (abdominal aortic aneurysm) without rupture ([place]) - Follow-up ultrasound planned with vascular surgery in 2/19.  7. Elevated blood pressure - Blood pressure markedly elevated today. His blood pressure is usually well controlled and he is not currently on medical therapy. I have asked him to obtain several readings over the next couple of weeks and send those to me. If his blood pressure remains above target, he will need medical therapy. We could consider beta blocker given his history of aneurysm. Otherwise I would place him on amlodipine.  Dispo:  Return in about 3 months (around 12/04/2016) for Routine Follow Up, w/ Dr. Burt Moyer.   Medication Adjustments/Labs and Tests Ordered: Current medicines are reviewed at length with the patient today.  Concerns regarding medicines are outlined above.  Orders/Tests:  Orders Placed This Encounter  Procedures  . CBC with Differential/Platelet  . Myocardial Perfusion Imaging   Medication changes: No orders of the defined types were placed in this encounter.  Signed, Richardson Dopp, PA-C  09/03/2016 5:25 PM    [facility] [address], [place], [place]  [address] Phone: [phone]; Fax: [phone]

## 2016-09-05 ENCOUNTER — Telehealth (HOSPITAL_COMMUNITY): Payer: Self-pay | Admitting: *Deleted

## 2016-09-05 NOTE — Telephone Encounter (Signed)
Left message on voicemail per DPR in reference to upcoming appointment scheduled on 09/07/16 with detailed instructions given per Myocardial Perfusion Study Information Sheet for the test. LM to arrive 15 minutes early, and that it is imperative to arrive on time for appointment to keep from having the test rescheduled. If you need to cancel or reschedule your appointment, please call the office within 24 hours of your appointment. Failure to do so may result in a cancellation of your appointment, and a $50 no show fee. Phone number given for call back for any questions. Joella Saefong Jacqueline    

## 2016-09-06 DIAGNOSIS — M5136 Other intervertebral disc degeneration, lumbar region: Secondary | ICD-10-CM | POA: Diagnosis not present

## 2016-09-06 DIAGNOSIS — M9903 Segmental and somatic dysfunction of lumbar region: Secondary | ICD-10-CM | POA: Diagnosis not present

## 2016-09-06 DIAGNOSIS — Q72812 Congenital shortening of left lower limb: Secondary | ICD-10-CM | POA: Diagnosis not present

## 2016-09-06 DIAGNOSIS — M9904 Segmental and somatic dysfunction of sacral region: Secondary | ICD-10-CM | POA: Diagnosis not present

## 2016-09-06 DIAGNOSIS — M9905 Segmental and somatic dysfunction of pelvic region: Secondary | ICD-10-CM | POA: Diagnosis not present

## 2016-09-07 ENCOUNTER — Telehealth: Payer: Self-pay | Admitting: *Deleted

## 2016-09-07 ENCOUNTER — Encounter: Payer: Self-pay | Admitting: Physician Assistant

## 2016-09-07 ENCOUNTER — Ambulatory Visit (HOSPITAL_COMMUNITY): Payer: Medicare Other | Attending: Cardiovascular Disease

## 2016-09-07 DIAGNOSIS — I350 Nonrheumatic aortic (valve) stenosis: Secondary | ICD-10-CM | POA: Diagnosis not present

## 2016-09-07 DIAGNOSIS — I10 Essential (primary) hypertension: Secondary | ICD-10-CM | POA: Diagnosis not present

## 2016-09-07 DIAGNOSIS — I714 Abdominal aortic aneurysm, without rupture: Secondary | ICD-10-CM | POA: Insufficient documentation

## 2016-09-07 DIAGNOSIS — R0602 Shortness of breath: Secondary | ICD-10-CM | POA: Insufficient documentation

## 2016-09-07 DIAGNOSIS — I2510 Atherosclerotic heart disease of native coronary artery without angina pectoris: Secondary | ICD-10-CM | POA: Diagnosis not present

## 2016-09-07 DIAGNOSIS — I4891 Unspecified atrial fibrillation: Secondary | ICD-10-CM | POA: Insufficient documentation

## 2016-09-07 LAB — MYOCARDIAL PERFUSION IMAGING
CHL CUP NUCLEAR SDS: 3
CHL CUP NUCLEAR SRS: 2
CHL CUP NUCLEAR SSS: 5
CSEPPHR: 96 {beats}/min
LV dias vol: 126 mL (ref 62–150)
LVSYSVOL: 61 mL
RATE: 0.31
Rest HR: 81 {beats}/min
TID: 1.04

## 2016-09-07 MED ORDER — REGADENOSON 0.4 MG/5ML IV SOLN
0.4000 mg | Freq: Once | INTRAVENOUS | Status: AC
  Administered 2016-09-07: 0.4 mg via INTRAVENOUS

## 2016-09-07 MED ORDER — TECHNETIUM TC 99M TETROFOSMIN IV KIT
29.2000 | PACK | Freq: Once | INTRAVENOUS | Status: AC | PRN
  Administered 2016-09-07: 29.2 via INTRAVENOUS
  Filled 2016-09-07: qty 30

## 2016-09-07 MED ORDER — TECHNETIUM TC 99M TETROFOSMIN IV KIT
10.2000 | PACK | Freq: Once | INTRAVENOUS | Status: AC | PRN
  Administered 2016-09-07: 10.2 via INTRAVENOUS
  Filled 2016-09-07: qty 11

## 2016-09-07 NOTE — Telephone Encounter (Signed)
-----   Message from Liliane Shi, [place] sent at 09/07/2016  4:39 PM EDT ----- Please call the patient. The stress test is normal.  Continue current management and follow up as planned.  Please fax a copy of this study result to his PCP:  Colon Branch, MD  Thanks! Richardson Dopp, PA-C    09/07/2016 4:37 PM

## 2016-09-07 NOTE — Telephone Encounter (Signed)
Pt notified of Myoview results by phone with verbal understanding. Pt aware to continue on current Tx plan. I will fax a copy to Dr. Larose Kells. Pt thanked me for my call today.

## 2016-09-11 DIAGNOSIS — M9905 Segmental and somatic dysfunction of pelvic region: Secondary | ICD-10-CM | POA: Diagnosis not present

## 2016-09-11 DIAGNOSIS — M9904 Segmental and somatic dysfunction of sacral region: Secondary | ICD-10-CM | POA: Diagnosis not present

## 2016-09-11 DIAGNOSIS — Q72812 Congenital shortening of left lower limb: Secondary | ICD-10-CM | POA: Diagnosis not present

## 2016-09-11 DIAGNOSIS — M9903 Segmental and somatic dysfunction of lumbar region: Secondary | ICD-10-CM | POA: Diagnosis not present

## 2016-09-11 DIAGNOSIS — M5136 Other intervertebral disc degeneration, lumbar region: Secondary | ICD-10-CM | POA: Diagnosis not present

## 2016-09-13 ENCOUNTER — Telehealth: Payer: Self-pay | Admitting: Physician Assistant

## 2016-09-13 ENCOUNTER — Other Ambulatory Visit: Payer: Self-pay | Admitting: Otolaryngology

## 2016-09-13 DIAGNOSIS — J329 Chronic sinusitis, unspecified: Secondary | ICD-10-CM | POA: Diagnosis not present

## 2016-09-13 DIAGNOSIS — R1314 Dysphagia, pharyngoesophageal phase: Secondary | ICD-10-CM | POA: Diagnosis not present

## 2016-09-13 DIAGNOSIS — R490 Dysphonia: Secondary | ICD-10-CM | POA: Diagnosis not present

## 2016-09-13 NOTE — Telephone Encounter (Signed)
Lm to call back ./cy 

## 2016-09-13 NOTE — Telephone Encounter (Signed)
Follow Up:    Returning Fernando Moyer's call from 09-07-16.

## 2016-09-14 NOTE — Telephone Encounter (Signed)
I returned pt's call from yesterday. I was off on Thursday 6/14. Message states pt is calling me back from when I called him on Friday 09/07/16. I s/w pt on 6/8 and went over his Myoview results at that time.

## 2016-09-18 ENCOUNTER — Other Ambulatory Visit: Payer: Medicare Other

## 2016-09-18 ENCOUNTER — Ambulatory Visit
Admission: RE | Admit: 2016-09-18 | Discharge: 2016-09-18 | Disposition: A | Discharge status: Home / Self Care | Payer: Medicare Other | Source: Ambulatory Visit | Attending: Otolaryngology | Admitting: Otolaryngology

## 2016-09-18 DIAGNOSIS — M9904 Segmental and somatic dysfunction of sacral region: Secondary | ICD-10-CM | POA: Diagnosis not present

## 2016-09-18 DIAGNOSIS — R1314 Dysphagia, pharyngoesophageal phase: Secondary | ICD-10-CM

## 2016-09-18 DIAGNOSIS — M5136 Other intervertebral disc degeneration, lumbar region: Secondary | ICD-10-CM | POA: Diagnosis not present

## 2016-09-18 DIAGNOSIS — M9905 Segmental and somatic dysfunction of pelvic region: Secondary | ICD-10-CM | POA: Diagnosis not present

## 2016-09-18 DIAGNOSIS — K449 Diaphragmatic hernia without obstruction or gangrene: Secondary | ICD-10-CM | POA: Diagnosis not present

## 2016-09-18 DIAGNOSIS — M9903 Segmental and somatic dysfunction of lumbar region: Secondary | ICD-10-CM | POA: Diagnosis not present

## 2016-09-18 DIAGNOSIS — Q72812 Congenital shortening of left lower limb: Secondary | ICD-10-CM | POA: Diagnosis not present

## 2016-09-19 ENCOUNTER — Ambulatory Visit: Payer: Medicare Other | Admitting: *Deleted

## 2016-09-19 ENCOUNTER — Telehealth: Payer: Self-pay | Admitting: Internal Medicine

## 2016-09-19 NOTE — Telephone Encounter (Signed)
Called pt to r/s awv appt on 10/12/16 at 8:30 am. Lvm for pt to call office to r/s appt.

## 2016-09-25 ENCOUNTER — Telehealth: Payer: Self-pay

## 2016-09-25 DIAGNOSIS — M9905 Segmental and somatic dysfunction of pelvic region: Secondary | ICD-10-CM | POA: Diagnosis not present

## 2016-09-25 DIAGNOSIS — I714 Abdominal aortic aneurysm, without rupture, unspecified: Secondary | ICD-10-CM

## 2016-09-25 DIAGNOSIS — M9903 Segmental and somatic dysfunction of lumbar region: Secondary | ICD-10-CM | POA: Diagnosis not present

## 2016-09-25 DIAGNOSIS — Q72812 Congenital shortening of left lower limb: Secondary | ICD-10-CM | POA: Diagnosis not present

## 2016-09-25 DIAGNOSIS — M9904 Segmental and somatic dysfunction of sacral region: Secondary | ICD-10-CM | POA: Diagnosis not present

## 2016-09-25 DIAGNOSIS — M5136 Other intervertebral disc degeneration, lumbar region: Secondary | ICD-10-CM | POA: Diagnosis not present

## 2016-09-25 NOTE — Telephone Encounter (Signed)
[place], Pt's grand-daughter of telephone call with Dr. Jimmye Norman today- and recommendations. US Aorta duplex ordered. X-rays given to [facility] for abstraction to cardio.

## 2016-09-25 NOTE — Telephone Encounter (Signed)
Spoke w/ Melissa for > 15 mins- just wanted to speak with someone to reassure her that PCP is aware of Pt's symptoms and that care is being coordinated appropriately.

## 2016-09-25 NOTE — Telephone Encounter (Signed)
Damita Dunnings, [place] at 09/25/2016 10:26 AM   Status: Signed    Spoke w/ Dr. Jola Baptist Burna Mortimer), he is seeing Pt today in office- x-rays performed and provider noted 4.6cm AAA. Pt was telling provider about hoarsness and "throat issues"- Pt has also reported to provider "movement in chest and belly." Dr. Jimmye Norman wonders if symptoms could be related to enlarging AAA. Dr. Jimmye Norman would like to discuss with PCP further when PCP available.   [phone] (mobile) [phone] (office)    Damita Dunnings, [place] at 09/25/2016 11:07 AM   Status: Signed    Pt dropped off CD imaging of x-rays.

## 2016-09-25 NOTE — Telephone Encounter (Signed)
I spoke with Dr. Jimmye Norman who is concerned about the x-ray findings. I then spoke with Melissa, currently in the patient has mild hoarseness and raspy voice (recently evaluated with barium swallow study showing esophageal stricture). No chest pain. Had back pain but it has improved significantly after the chiropractor manipulation. Plan will be as previously commended with a ultrasound of the aorta, ER if severe back or abdominal or chest pain. He has some other symptoms, see below, I recommend Melissa to bring the patient in 2 days for assessment.

## 2016-09-25 NOTE — Telephone Encounter (Addendum)
Melissa would like to speak with nurse regarding message below best # [phone]

## 2016-09-25 NOTE — Telephone Encounter (Signed)
History of AAA. Last duplex 2015. Please enter a order for a aorta duplex (@ cardiology) DX AAA. If he develops back or abdominal pain he needs to let us know immediately or go to the ER. Arrange a cardiology office visit regards AAA

## 2016-09-28 ENCOUNTER — Encounter: Payer: Self-pay | Admitting: Internal Medicine

## 2016-09-28 ENCOUNTER — Ambulatory Visit (INDEPENDENT_AMBULATORY_CARE_PROVIDER_SITE_OTHER): Payer: Medicare Other | Admitting: Internal Medicine

## 2016-09-28 VITALS — BP 152/64 | HR 73 | Temp 98.1°F | Resp 16 | Ht 67.0 in | Wt 208.5 lb

## 2016-09-28 DIAGNOSIS — K222 Esophageal obstruction: Secondary | ICD-10-CM

## 2016-09-28 DIAGNOSIS — R490 Dysphonia: Secondary | ICD-10-CM | POA: Diagnosis not present

## 2016-09-28 DIAGNOSIS — D649 Anemia, unspecified: Secondary | ICD-10-CM

## 2016-09-28 DIAGNOSIS — I714 Abdominal aortic aneurysm, without rupture, unspecified: Secondary | ICD-10-CM

## 2016-09-28 DIAGNOSIS — D509 Iron deficiency anemia, unspecified: Secondary | ICD-10-CM | POA: Diagnosis not present

## 2016-09-28 DIAGNOSIS — Z09 Encounter for follow-up examination after completed treatment for conditions other than malignant neoplasm: Secondary | ICD-10-CM | POA: Diagnosis not present

## 2016-09-28 DIAGNOSIS — I745 Embolism and thrombosis of iliac artery: Secondary | ICD-10-CM

## 2016-09-28 DIAGNOSIS — R1310 Dysphagia, unspecified: Secondary | ICD-10-CM

## 2016-09-28 LAB — COMPREHENSIVE METABOLIC PANEL
ALBUMIN: 4 g/dL (ref 3.6–5.1)
ALK PHOS: 71 U/L (ref 40–115)
ALT: 9 U/L (ref 9–46)
AST: 22 U/L (ref 10–35)
BILIRUBIN TOTAL: 1 mg/dL (ref 0.2–1.2)
BUN: 14 mg/dL (ref 7–25)
CO2: 23 mmol/L (ref 20–31)
CREATININE: 1.12 mg/dL — AB (ref 0.70–1.11)
Calcium: 9.2 mg/dL (ref 8.6–10.3)
Chloride: 103 mmol/L (ref 98–110)
Glucose, Bld: 139 mg/dL — ABNORMAL HIGH (ref 65–99)
Potassium: 4 mmol/L (ref 3.5–5.3)
SODIUM: 140 mmol/L (ref 135–146)
TOTAL PROTEIN: 6.1 g/dL (ref 6.1–8.1)

## 2016-09-28 LAB — CBC WITH DIFFERENTIAL/PLATELET
BASOS PCT: 1 %
Basophils Absolute: 85 cells/uL (ref 0–200)
Eosinophils Absolute: 255 cells/uL (ref 15–500)
Eosinophils Relative: 3 %
HCT: 43.4 % (ref 38.5–50.0)
Hemoglobin: 14.5 g/dL (ref 13.2–17.1)
LYMPHS PCT: 20 %
Lymphs Abs: 1700 cells/uL (ref 850–3900)
MCH: 29.4 pg (ref 27.0–33.0)
MCHC: 33.4 g/dL (ref 32.0–36.0)
MCV: 87.9 fL (ref 80.0–100.0)
MONOS PCT: 10 %
MPV: 9.6 fL (ref 7.5–12.5)
Monocytes Absolute: 850 cells/uL (ref 200–950)
NEUTROS PCT: 66 %
Neutro Abs: 5610 cells/uL (ref 1500–7800)
PLATELETS: 242 10*3/uL (ref 140–400)
RBC: 4.94 MIL/uL (ref 4.20–5.80)
RDW: 17.1 % — AB (ref 11.0–15.0)
WBC: 8.5 10*3/uL (ref 3.8–10.8)

## 2016-09-28 MED ORDER — OMEPRAZOLE 40 MG PO CPDR: 40.0000 mg | DELAYED_RELEASE_CAPSULE | Freq: Two times a day (BID) | ORAL | 1 refills | Status: DC

## 2016-09-28 NOTE — Progress Notes (Signed)
Pre visit review using our clinic review tool, if applicable. No additional management support is needed unless otherwise documented below in the visit note. 

## 2016-09-28 NOTE — Assessment & Plan Note (Addendum)
Abdominal Aortic aneurysm: Well known  AAA visualized recently on a XR; currently with no symptoms that indicate a rupture or an acute enlargement. We have the ultrasound pending and a cardiology referral. CP: Patient's family very concerned about pt's CP and hoarseness; and they fear it could be related to another aneurysm located in the chest.  Pain is not  c/w a thoracic aortic aneurysm, seems to be clearly related to a well-known GI problem. Chest x-ray last month show a normal mediastinum. Rec observation. Hoarseness: Status post ENT eval. Had a fiberoptic scope and a  barium swallow, see below, they rec to see GI: 1. Moderate size hiatal hernia with mild gastroesophageal reflux. 2. Barium pill lodges just above the hiatal hernia indicating a short segment distal esophageal stricture. Esophageal stricture: Had a EGD last month, found to have a benign esophageal stricture, he was on Eliquis, no dilatation performed. Sx likely due to increase esophageal stenosis so  I spoke with [facility] Going,  they will contact the patient and most likely will set up a OV, hold anticoagulants and proceeded with EGD.  For now continue PPIs twice a day (compliance? No recent RFs , Rx printed), also rec a liquid-puree diet and low threshold for a  ER vist if sx worse. Anemia: Recheck labs. He was somewhat tender at the epigastric area , will recheck a CBC, CMP Also: I left a detailed message to Lenna Sciara, family member and send a message

## 2016-09-28 NOTE — Patient Instructions (Addendum)
GO TO THE LAB : Get the blood work     Take omeprazole twice a day, see prescription  Diet: Only quits or very soft foods such as mashed potatoes, jello, pudding, apple sauce  Go to the ER if severe chest pain, unable to keep  yourself hydrated.

## 2016-09-28 NOTE — Progress Notes (Addendum)
Subjective:    Patient ID: Fernando Moyer, male    DOB: Dec 02, 1933, 83 y.o.   MRN: [id number]  DOS:  09/28/2016 Type of visit - description : Acute Interval history: Recently had a x-ray at the chiropractor and it showed a calcified aneurysm of the abdomen, that prompted this visit however he has other concerns.  The main symptom he has today is hoarseness and dysphagia. The dysphagia is going on for a while, has seen GI and workup was reviewed. Hoarseness is going on for at least 2 months, has seen ENT , notes reviewed.  When asked, admits to odynophagia. He also has occasionally post-prandial, mild, regurgitation . (+)dysphagia Last night did have more noticeable  nausea and vomited postprandially. No hematemesis. When asked admits to chest pain, it is constant, going on for several months, again worse with eating, no radiation to the back.  As far as the abdominal aortic aneurysm: Currently reports no abdominal pain, he had back pain but is better after chiropractor manipulation.   Review of Systems No fever chills. Continue with DOE, not a new issue  Past Medical History:  Diagnosis Date  . AAA (abdominal aortic aneurysm) ([place]) 05/2008   PER CARDIAC CATH 3.5 X 3. 5 CM  . Aortic stenosis 05/2008   PER CARDIAC CATH, MILD, MITRAL STENOSIS  . Back pain    cause unknown but does get Cortisone injection every now and then  . Benign neoplasm of colon   . Bowen's disease    right arm BX: referred to dermatology  . Carotid artery occlusion    last u/s 3-11...Marland Kitchenper vasc.surgery 2007 cath: nonobstructive CAD. mil;d MS, trivial AoS, small AAA,  . Cataract    RIGHT removed  . Chronic back pain   . COPD (chronic obstructive pulmonary disease) ([place])   . Coronary artery disease 05/2008   MILD, MEDICAL MANAGEMENT  . Diabetes mellitus ([place]) 09/07/2011   no per pt- no medications  . Dyspnea    with exertion only  . GERD with stricture    w/hx of stricture  . History of nuclear stress  test    Myoview 6/18: EF 51, normal perfusion; Low Risk  . Hyperlipidemia    takes Pravastatin daily  . Mitral stenosis 05/2008   PER CARDIAC CATH  . Myocardial infarction ([place]) 2012  . Peripheral neuropathy    right thigh; "it's been that way for years"  . Pulmonary hypertension ([place])   . PVD (peripheral vascular disease) ([place]) 12/07   per cath 12/07....iliac  . Stricture and stenosis of esophagus     Past Surgical History:  Procedure Laterality Date  . AORTIC VALVE REPLACEMENT  08/17/2011   Procedure: AORTIC VALVE REPLACEMENT (AVR);  Surgeon: Gaye Pollack, MD;  Location: [facility];  Service: Open Heart Surgery;  Laterality: N/A;  . APPENDECTOMY    . CARDIAC CATHETERIZATION  02/2011   LAD 50, CFX OK, RCA40, EF 55%; PCWP 17, AoV  0.9 cm squared  . ENDARTERECTOMY  08-2010   Right carotid endarterectomy    . ESOPHAGEAL DILATION    . ESOPHAGOGASTRODUODENOSCOPY N/A 10/29/2015   Procedure: ESOPHAGOGASTRODUODENOSCOPY (EGD);  Surgeon: Jerene Bears, MD;  Location[facility] ENDOSCOPY;  Service: Endoscopy;  Laterality: N/A;  . ESOPHAGOGASTRODUODENOSCOPY (EGD) WITH PROPOFOL N/A 11/10/2015   Procedure: ESOPHAGOGASTRODUODENOSCOPY (EGD) WITH PROPOFOL;  Surgeon: Milus Banister, MD;  Location: [facility] ENDOSCOPY;  Service: Endoscopy;  Laterality: N/A;  . ESOPHAGOGASTRODUODENOSCOPY (EGD) WITH PROPOFOL N/A 11/24/2015   Procedure: ESOPHAGOGASTRODUODENOSCOPY (EGD) WITH PROPOFOL;  Surgeon: Milus Banister, MD;  Location: [facility] ENDOSCOPY;  Service: Endoscopy;  Laterality: N/A;  dil  . eye lid surgery Bilateral ~ 07-2015  . EYE SURGERY  10/14/13   cataract surgery LEFT eye   . LARYNGOSCOPY  08/29/2016   transnasal  . LEFT AND RIGHT HEART CATHETERIZATION WITH CORONARY ANGIOGRAM N/A 02/23/2011   Procedure: LEFT AND RIGHT HEART CATHETERIZATION WITH CORONARY ANGIOGRAM;  Surgeon: [name], MD;  Location: [facility] CATH LAB;  Service: Cardiovascular;  Laterality: N/A;  . STERNAL WIRES REMOVAL N/A 05/01/2013   Procedure: STERNAL  WIRES REMOVAL;  Surgeon: Gaye Pollack, MD;  Location: [facility] OR;  Service: Thoracic;  Laterality: N/A;    Social History   Social History  . Marital status: Widowed    Spouse name: N/A  . Number of children: 2  . Years of education: N/A   Occupational History  . 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 Smoking status: Former Smoker    Packs/day: 2.00    Years: 30.00    Types: Cigarettes    Quit date: 04/03/1987  . Smokeless tobacco: Never Used     Comment: quit smoking in 1989  . Alcohol use No  . Drug use: No  . Sexual activity: Not on file   Other Topics Concern  . Not on file   Social History Narrative   LIVES BY HIMSELF    HAS 2 GROWN SONS, 5 G-KIDS    Lenna Sciara is her granddaughter, she is a Art therapist for the school system, usually comes with him for his appointments             Allergies as of 09/28/2016   No Known Allergies     Medication List       Accurate as of 09/28/16 10:19 PM. Always use your most recent med list.          acetaminophen 500 MG tablet Commonly known as:  TYLENOL Take 1,000 mg by mouth every 6 (six) hours as needed (pain). Reported on 04/21/2015   ELIQUIS 5 MG Tabs tablet Generic drug:  apixaban TAKE 1 TABLET TWICE A DAY   ferrous sulfate 325 (65 FE) MG tablet Commonly known as:  FERROUSUL Take 1 tablet (325 mg total) by mouth 2 (two) times daily with a meal.   furosemide 40 MG tablet Commonly known as:  LASIX TAKE 1 TABLET DAILY   Glycopyrrolate-Formoterol 9-4.8 MCG/ACT Aero Commonly known as:  BEVESPI AEROSPHERE Inhale 1 Inhaler into the lungs 2 (two) times daily.   KLOR-CON M20 20 MEQ tablet Generic drug:  potassium chloride SA TAKE 1 TABLET DAILY   meclizine 25 MG tablet Commonly known as:  ANTIVERT TAKE 1 TABLET 3 TIMES A DAY AS NEEDED FOR DIZZINESS   omeprazole 40 MG capsule Commonly known as:  PRILOSEC Take 1 capsule  (40 mg total) by mouth 2 (two) times daily.   pravastatin 40 MG tablet Commonly known as:  PRAVACHOL Take 1 tablet (40 mg total) by mouth daily.   traMADol 50 MG tablet Commonly known as:  ULTRAM Take 1 tablet (50 mg total) by mouth every 12 (twelve) hours as needed. for pain          Objective:   Physical Exam BP (!) 152/64 (BP Location: Right Arm, Patient Position: Sitting, Cuff Size: Normal)   Pulse 73   Temp 98.1 F (36.7  C) (Oral)   Resp 16   Ht 5\' 7"  (1.702 m)   Wt 208 lb 8 oz (94.6 kg)   SpO2 97%   BMI 32.66 kg/m  General:   Well developed, well nourished. During the visit, he had trouble with nausea and cough a couple of times HEENT:  Normocephalic . Face symmetric, atraumatic. Neck: No mass. + Hoarseness noted. Lungs:  CTA B Normal respiratory effort, no intercostal retractions, no accessory muscle use. Heart: RRR,  no murmur.  no pretibial edema bilaterally  Abdomen:  Not distended, soft, tender in the epigastric area without mass or rebound. Positive bowel sounds, positive bruit.  Skin: Not pale. Not jaundice Neurologic:  alert & oriented X3.  Speech normal, gait appropriate for age and unassisted Psych--  Cognition and judgment appear intact.  Cooperative with normal attention span and concentration.  Behavior appropriate. No anxious or depressed appearing.    Assessment & Plan:    Assessment DM  w/ neuropathy (feet burning, nl exam) COPD Hyperlipidemia GERD with stricture, EGD x 3 ~ July 2017  CV: --CAD,  Last myoview 2014 --Carotid artery disease-- R CEA 2012 --A. Fib-- paroxysmal - eliquis  --Aortic valve replacement (bio) --PVD: AAA, iliAortic valve, subclavian artery stenosis  --KD:XIPJA open heart surgery 2013. CT chest 2014 no PE, gallbladder stones. MSK-- back pain, sees Dr Nelva Bush, tramadol per PCP skin cancer  PLAN  Abdominal Aortic aneurysm: Well known  AAA visualized recently on a XR; currently with no symptoms that indicate a  rupture or an acute enlargement. We have the ultrasound pending and a cardiology referral. CP: Patient's family very concerned about pt's CP and hoarseness; and they fear it could be related to another aneurysm located in the chest.  Pain is not  c/w a thoracic aortic aneurysm, seems to be clearly related to a well-known GI problem. Chest x-ray last month show a normal mediastinum. Rec observation. Hoarseness: Status post ENT eval. Had a fiberoptic scope and a  barium swallow, see below, they rec to see GI: 1. Moderate size hiatal hernia with mild gastroesophageal reflux. 2. Barium pill lodges just above the hiatal hernia indicating a short segment distal esophageal stricture. Esophageal stricture: Had a EGD last month, found to have a benign esophageal stricture, he was on Eliquis, no dilatation performed. Sx likely due to increase esophageal stenosis so  I spoke with Wallace Going,  they will contact the patient and most likely will set up a OV, hold anticoagulants and proceeded with EGD.  For now continue PPIs twice a day (compliance? No recent RFs , Rx printed), also rec a liquid-puree diet and low threshold for a  ER vist if sx worse. Anemia: Recheck labs. He was somewhat tender at the epigastric area, will recheck a CBC, CMP Also: I left a detailed message to Lenna Sciara, family member and send a message  Today, I spent more than 45   min with the patient: >50% of the time counseling regards his sx, reviewing the chart, coordinating his care and leaving a detail message to the family

## 2016-10-01 ENCOUNTER — Ambulatory Visit (HOSPITAL_COMMUNITY)
Admission: RE | Admit: 2016-10-01 | Discharge: 2016-10-01 | Disposition: A | Discharge status: Home / Self Care | Payer: Medicare Other | Source: Ambulatory Visit | Attending: Cardiology | Admitting: Cardiology

## 2016-10-01 DIAGNOSIS — I745 Embolism and thrombosis of iliac artery: Secondary | ICD-10-CM | POA: Insufficient documentation

## 2016-10-01 DIAGNOSIS — Z951 Presence of aortocoronary bypass graft: Secondary | ICD-10-CM | POA: Diagnosis not present

## 2016-10-01 DIAGNOSIS — J449 Chronic obstructive pulmonary disease, unspecified: Secondary | ICD-10-CM | POA: Diagnosis not present

## 2016-10-01 DIAGNOSIS — I2510 Atherosclerotic heart disease of native coronary artery without angina pectoris: Secondary | ICD-10-CM | POA: Insufficient documentation

## 2016-10-01 DIAGNOSIS — Z87891 Personal history of nicotine dependence: Secondary | ICD-10-CM | POA: Insufficient documentation

## 2016-10-01 DIAGNOSIS — I714 Abdominal aortic aneurysm, without rupture, unspecified: Secondary | ICD-10-CM

## 2016-10-01 DIAGNOSIS — I10 Essential (primary) hypertension: Secondary | ICD-10-CM | POA: Diagnosis not present

## 2016-10-01 DIAGNOSIS — E1151 Type 2 diabetes mellitus with diabetic peripheral angiopathy without gangrene: Secondary | ICD-10-CM | POA: Diagnosis not present

## 2016-10-01 DIAGNOSIS — E785 Hyperlipidemia, unspecified: Secondary | ICD-10-CM | POA: Insufficient documentation

## 2016-10-01 DIAGNOSIS — I708 Atherosclerosis of other arteries: Secondary | ICD-10-CM | POA: Diagnosis not present

## 2016-10-02 ENCOUNTER — Telehealth: Payer: Self-pay | Admitting: Internal Medicine

## 2016-10-02 ENCOUNTER — Other Ambulatory Visit: Payer: Medicare Other | Admitting: *Deleted

## 2016-10-02 DIAGNOSIS — I350 Nonrheumatic aortic (valve) stenosis: Secondary | ICD-10-CM

## 2016-10-02 NOTE — Telephone Encounter (Signed)
Labs were okay, ultrasound yesterday shows stable infrarenal aortic aneurysm. Please call Lenna Sciara how is the patient doing? Feeling better?. Needs to see GI. Let me know although if he is not feeling better needs to go to the ER

## 2016-10-02 NOTE — Telephone Encounter (Signed)
LMOM requesting call back

## 2016-10-03 LAB — CBC WITH DIFFERENTIAL/PLATELET
BASOS: 1 %
Basophils Absolute: 0 10*3/uL (ref 0.0–0.2)
EOS (ABSOLUTE): 0.3 10*3/uL (ref 0.0–0.4)
Eos: 3 %
HEMATOCRIT: 43.5 % (ref 37.5–51.0)
Hemoglobin: 15 g/dL (ref 13.0–17.7)
IMMATURE GRANULOCYTES: 0 %
Immature Grans (Abs): 0 10*3/uL (ref 0.0–0.1)
LYMPHS ABS: 1.7 10*3/uL (ref 0.7–3.1)
Lymphs: 20 %
MCH: 30.1 pg (ref 26.6–33.0)
MCHC: 34.5 g/dL (ref 31.5–35.7)
MCV: 87 fL (ref 79–97)
MONOS ABS: 0.6 10*3/uL (ref 0.1–0.9)
Monocytes: 7 %
NEUTROS PCT: 69 %
Neutrophils Absolute: 5.8 10*3/uL (ref 1.4–7.0)
Platelets: 231 10*3/uL (ref 150–379)
RBC: 4.98 x10E6/uL (ref 4.14–5.80)
RDW: 17.1 % — AB (ref 12.3–15.4)
WBC: 8.5 10*3/uL (ref 3.4–10.8)

## 2016-10-05 ENCOUNTER — Telehealth: Payer: Self-pay

## 2016-10-05 ENCOUNTER — Telehealth: Payer: Self-pay | Admitting: *Deleted

## 2016-10-05 NOTE — Telephone Encounter (Signed)
DPR for pt's granddaughter Lenna Sciara who called back today and been notified of pt's lab results by phone as well as MY CHART.

## 2016-10-05 NOTE — Telephone Encounter (Signed)
LMOM requesting call back

## 2016-10-05 NOTE — Telephone Encounter (Signed)
-----   Message from Willia Craze, NP sent at 10/04/2016  5:12 PM EDT ----- Chong Sicilian, will you schedule this please? Thanks ----- Message ----- From: Milus Banister, MD Sent: 09/28/2016  11:43 PM To: Willia Craze, NP  Yes, egd with balloon dilation, next available in [place].  Will need to hold eliquis for 2 days prior (need anticoag note to the prescribing MD). Thanks   ----- Message ----- From: Willia Craze, NP Sent: 09/28/2016   4:19 PM To: Milus Banister, MD  Jacqulyn Cane,  I got a call from [facility] about this patient.   I saw him late April for new anemia on Eliquis. Mallie Mussel did an EGD (he had an opening). A mild esophageal stricture found but patient had no complaints of dysphagia so not dilated. He has been seeing an ENT for nosebleeds ( possibly cause of anemia). Patient mentioned swallowing problems to ENT who ordered barium swallow on 6/18. The barium pill lodged in esoph.   I advised swallowing precautions and told Jacqulyn Bath I would get with patient about next step. Do you want EGD with dilation? He is on blood thinner so will need to get that held.   Thanks, Nevin Bloodgood

## 2016-10-07 ENCOUNTER — Encounter: Payer: Self-pay | Admitting: Internal Medicine

## 2016-10-08 NOTE — Telephone Encounter (Signed)
See MyChart message from [facility] sent over weekend.

## 2016-10-09 DIAGNOSIS — M5136 Other intervertebral disc degeneration, lumbar region: Secondary | ICD-10-CM | POA: Diagnosis not present

## 2016-10-09 DIAGNOSIS — M9904 Segmental and somatic dysfunction of sacral region: Secondary | ICD-10-CM | POA: Diagnosis not present

## 2016-10-09 DIAGNOSIS — M9905 Segmental and somatic dysfunction of pelvic region: Secondary | ICD-10-CM | POA: Diagnosis not present

## 2016-10-09 DIAGNOSIS — M9903 Segmental and somatic dysfunction of lumbar region: Secondary | ICD-10-CM | POA: Diagnosis not present

## 2016-10-09 DIAGNOSIS — Q72812 Congenital shortening of left lower limb: Secondary | ICD-10-CM | POA: Diagnosis not present

## 2016-10-11 NOTE — Telephone Encounter (Signed)
Left message on machine to call back  

## 2016-10-12 ENCOUNTER — Ambulatory Visit: Payer: Medicare Other | Admitting: *Deleted

## 2016-10-12 ENCOUNTER — Ambulatory Visit: Payer: Medicare Other | Admitting: Internal Medicine

## 2016-10-12 NOTE — Telephone Encounter (Signed)
Ok, thanks.

## 2016-10-12 NOTE — Telephone Encounter (Signed)
I spoke with the pt's daughter and she states that the pt does not want to set up any further testing until he finds out why his "throat is sore and it hurts to talk".  They have contacted Dr Larose Kells and will call back if they decide to set up EGD.

## 2016-10-15 NOTE — Progress Notes (Deleted)
Subjective:   Fernando Moyer is a 83 y.o. male who presents for Medicare Annual/Subsequent preventive examination.  Review of Systems:  No ROS.  Medicare Wellness Visit. Additional risk factors are reflected in the social history.    Sleep patterns:   Home Safety/Smoke Alarms: Feels safe in home. Smoke alarms in place.  Living environment; residence and Firearm Safety:  [place] Safety/Bike Helmet: Wears seat belt.   Counseling:   Eye Exam-  Dental-  Male:   CCS- No longer doing routine screening due to age. PSA-  Lab Results  Component Value Date   PSA 3.56 06/17/2014   PSA 3.68 01/17/2011   PSA 2.62 02/01/2006       Objective:    Vitals: There were no vitals taken for this visit.  There is no height or weight on file to calculate BMI.  Tobacco History  Smoking Status  . Former Smoker  . Packs/day: 2.00  . Years: 30.00  . Types: Cigarettes  . Quit date: 04/03/1987  Smokeless Tobacco  . Never Used    Comment: quit smoking in 1989     Counseling given: Not Answered   Past Medical History:  Diagnosis Date  . AAA (abdominal aortic aneurysm) ([place]) 05/2008   PER CARDIAC CATH 3.5 X 3. 5 CM  . Aortic stenosis 05/2008   PER CARDIAC CATH, MILD, MITRAL STENOSIS  . Back pain    cause unknown but does get Cortisone injection every now and then  . Benign neoplasm of colon   . Bowen's disease    right arm BX: referred to dermatology  . Carotid artery occlusion    last u/s 3-11...Marland Kitchenper vasc.surgery 2007 cath: nonobstructive CAD. mil;d MS, trivial AoS, small AAA,  . Cataract    RIGHT removed  . Chronic back pain   . COPD (chronic obstructive pulmonary disease) ([place])   . Coronary artery disease 05/2008   MILD, MEDICAL MANAGEMENT  . Diabetes mellitus ([place]) 09/07/2011   no per pt- no medications  . Dyspnea    with exertion only  . GERD with stricture    w/hx of stricture  . History of nuclear stress test    Myoview 6/18: EF 51, normal perfusion; Low Risk  .  Hyperlipidemia    takes Pravastatin daily  . Mitral stenosis 05/2008   PER CARDIAC CATH  . Myocardial infarction ([place]) 2012  . Peripheral neuropathy    right thigh; "it's been that way for years"  . Pulmonary hypertension ([place])   . PVD (peripheral vascular disease) ([place]) 12/07   per cath 12/07....iliac  . Stricture and stenosis of esophagus    Past Surgical History:  Procedure Laterality Date  . AORTIC VALVE REPLACEMENT  08/17/2011   Procedure: AORTIC VALVE REPLACEMENT (AVR);  Surgeon: Gaye Pollack, MD;  Location: [place];  Service: Open Heart Surgery;  Laterality: N/A;  . APPENDECTOMY    . CARDIAC CATHETERIZATION  02/2011   LAD 50, CFX OK, RCA40, EF 55%; PCWP 17, AoV  0.9 cm squared  . ENDARTERECTOMY  08-2010   Right carotid endarterectomy    . ESOPHAGEAL DILATION    . ESOPHAGOGASTRODUODENOSCOPY N/A 10/29/2015   Procedure: ESOPHAGOGASTRODUODENOSCOPY (EGD);  Surgeon: Jerene Bears, MD;  Location[facility] ENDOSCOPY;  Service: Endoscopy;  Laterality: N/A;  . ESOPHAGOGASTRODUODENOSCOPY (EGD) WITH PROPOFOL N/A 11/10/2015   Procedure: ESOPHAGOGASTRODUODENOSCOPY (EGD) WITH PROPOFOL;  Surgeon: Milus Banister, MD;  Location: [facility] ENDOSCOPY;  Service: Endoscopy;  Laterality: N/A;  . ESOPHAGOGASTRODUODENOSCOPY (EGD) WITH PROPOFOL N/A  11/24/2015   Procedure: ESOPHAGOGASTRODUODENOSCOPY (EGD) WITH PROPOFOL;  Surgeon: Milus Banister, MD;  Location: [facility] ENDOSCOPY;  Service: Endoscopy;  Laterality: N/A;  dil  . eye lid surgery Bilateral ~ 07-2015  . EYE SURGERY  10/14/13   cataract surgery LEFT eye   . LARYNGOSCOPY  08/29/2016   transnasal  . LEFT AND RIGHT HEART CATHETERIZATION WITH CORONARY ANGIOGRAM N/A 02/23/2011   Procedure: LEFT AND RIGHT HEART CATHETERIZATION WITH CORONARY ANGIOGRAM;  Surgeon: [name], MD;  Location: [facility] CATH LAB;  Service: Cardiovascular;  Laterality: N/A;  . STERNAL WIRES REMOVAL N/A 05/01/2013   Procedure: STERNAL WIRES REMOVAL;  Surgeon: Gaye Pollack, MD;  Location: [facility] OR;   Service: Thoracic;  Laterality: N/A;   Family History  Problem Relation Age of Onset  . Heart attack Father 42       MI  . Prostate cancer Brother 35  . Colon cancer Neg Hx   . Anesthesia problems Neg Hx   . Hypotension Neg Hx   . Malignant hyperthermia Neg Hx   . Pseudochol deficiency Neg Hx   . Stomach cancer Neg Hx   . Esophageal cancer Neg Hx   . Rectal cancer Neg Hx   . Pancreatic cancer Neg Hx    History  Sexual Activity  . Sexual activity: Not on file    Outpatient Encounter Prescriptions as of 10/19/2016  Medication Sig  . acetaminophen (TYLENOL) 500 MG tablet Take 1,000 mg by mouth every 6 (six) hours as needed (pain). Reported on 04/21/2015  . ELIQUIS 5 MG TABS tablet TAKE 1 TABLET TWICE A DAY  . ferrous sulfate (FERROUSUL) 325 (65 FE) MG tablet Take 1 tablet (325 mg total) by mouth 2 (two) times daily with a meal.  . furosemide (LASIX) 40 MG tablet TAKE 1 TABLET DAILY  . Glycopyrrolate-Formoterol (BEVESPI AEROSPHERE) 9-4.8 MCG/ACT AERO Inhale 1 Inhaler into the lungs 2 (two) times daily.  Marland Kitchen KLOR-CON M20 20 MEQ tablet TAKE 1 TABLET DAILY  . meclizine (ANTIVERT) 25 MG tablet TAKE 1 TABLET 3 TIMES A DAY AS NEEDED FOR DIZZINESS  . omeprazole (PRILOSEC) 40 MG capsule Take 1 capsule (40 mg total) by mouth 2 (two) times daily.  . pravastatin (PRAVACHOL) 40 MG tablet Take 1 tablet (40 mg total) by mouth daily.  . traMADol (ULTRAM) 50 MG tablet Take 1 tablet (50 mg total) by mouth every 12 (twelve) hours as needed. for pain   Facility-Administered Encounter Medications as of 10/19/2016  Medication  . 0.9 %  sodium chloride infusion    Activities of Daily Living In your present state of health, do you have any difficulty performing the following activities: 12/27/2015  Hearing? Y  Vision? N  Difficulty concentrating or making decisions? N  Walking or climbing stairs? N  Dressing or bathing? N  Doing errands, shopping? N  Some recent data might be hidden    Patient Care  Team: Colon Branch, MD as PCP - General Angelia Mould, MD as Consulting Physician (Vascular Surgery) [name], MD as Consulting Physician (Cardiology) Murvin Donning, MD (Dermatology) Calvert Cantor, MD as Consulting Physician (Ophthalmology) Suella Broad, MD as Consulting Physician (Physical Medicine and Rehabilitation) Melissa Montane, MD as Consulting Physician (Otolaryngology)   Assessment:    Physical assessment deferred to PCP.  Exercise Activities and Dietary recommendations   Diet (meal preparation, eat out, water intake, caffeinated beverages, dairy products, fruits and vegetables): {Desc; diets:16563} Breakfast: Lunch:  Dinner:      Goals  None     Fall Risk Fall Risk  12/27/2015 08/22/2015 04/21/2015 10/19/2014 06/29/2013  Falls in the past year? No No No No No   Depression Screen PHQ 2/9 Scores 12/27/2015 08/22/2015 04/21/2015 10/19/2014  PHQ - 2 Score 0 0 0 0    Cognitive Function        Immunization History  Administered Date(s) Administered  . Influenza Split 01/17/2011  . Influenza Whole 05/06/2009  . Influenza, High Dose Seasonal PF 04/21/2015, 12/27/2015  . Influenza, Seasonal, Injecte, Preservative Fre 04/18/2012  . Influenza,inj,Quad PF,36+ Mos 01/20/2013, 02/01/2014  . Pneumococcal Conjugate-13 06/17/2014  . Pneumococcal Polysaccharide-23 03/02/2006, 12/07/2011  . Td 04/02/1998  . Tdap 01/17/2011  . Zoster 09/30/2013   Screening Tests Health Maintenance  Topic Date Due  . URINE MICROALBUMIN  04/20/2016  . OPHTHALMOLOGY EXAM  08/04/2016  . FOOT EXAM  08/21/2016  . INFLUENZA VACCINE  10/31/2016  . HEMOGLOBIN A1C  02/03/2017  . TETANUS/TDAP  01/16/2021  . PNA vac Low Risk Adult  Completed      Plan:   ***  I have personally reviewed and noted the following in the patient's chart:   . Medical and social history . Use of alcohol, tobacco or illicit drugs  . Current medications and supplements . Functional ability and  status . Nutritional status . Physical activity . Advanced directives . List of other physicians . Hospitalizations, surgeries, and ER visits in previous 12 months . Vitals . Screenings to include cognitive, depression, and falls . Referrals and appointments  In addition, I have reviewed and discussed with patient certain preventive protocols, quality metrics, and best practice recommendations. A written personalized care plan for preventive services as well as general preventive health recommendations were provided to patient.     Naaman Plummer [place], [place]  10/15/2016

## 2016-10-18 ENCOUNTER — Telehealth: Payer: Self-pay

## 2016-10-18 NOTE — Telephone Encounter (Signed)
Left message on machine to call back  

## 2016-10-18 NOTE — Telephone Encounter (Signed)
Notes   [name], MD at 10/18/2016 8:51 AM   Status: Signed    May hold 48 hours thanks

## 2016-10-18 NOTE — Telephone Encounter (Signed)
RE: Fernando Moyer DOB: 17-Jun-1933 MRN: [id number]   Dear Dr Burt Knack,    We have scheduled the above patient for an endoscopic procedure (EGD) with Dr Ardis Hughs. Our records show that he is on anticoagulation therapy.   Please advise as to how long the patient may come off his therapy of Eliquis prior to the procedure.  Please fax back/ or route the completed form to Ikesha Siller at [phone].   Sincerely,    Jeoffrey Massed RN

## 2016-10-18 NOTE — Progress Notes (Signed)
May hold 48 hours thanks

## 2016-10-19 ENCOUNTER — Ambulatory Visit: Payer: Medicare Other | Admitting: *Deleted

## 2016-10-19 NOTE — Telephone Encounter (Signed)
The pt's granddaughter has been advised of the 48 hour hold and verbalized understanding

## 2016-10-23 ENCOUNTER — Ambulatory Visit (AMBULATORY_SURGERY_CENTER): Payer: Self-pay

## 2016-10-23 VITALS — Ht 67.0 in | Wt 205.0 lb

## 2016-10-23 DIAGNOSIS — R1319 Other dysphagia: Secondary | ICD-10-CM

## 2016-10-23 NOTE — Progress Notes (Signed)
No allergies to eggs or soy No diet meds No home oxygen No past problems with anesthesia  Declined emmi 

## 2016-10-30 ENCOUNTER — Encounter: Payer: Self-pay | Admitting: Internal Medicine

## 2016-10-30 ENCOUNTER — Ambulatory Visit (INDEPENDENT_AMBULATORY_CARE_PROVIDER_SITE_OTHER): Payer: Medicare Other | Admitting: Internal Medicine

## 2016-10-30 VITALS — BP 128/78 | HR 78 | Temp 97.6°F | Resp 14 | Ht 67.0 in | Wt 204.5 lb

## 2016-10-30 DIAGNOSIS — I745 Embolism and thrombosis of iliac artery: Secondary | ICD-10-CM

## 2016-10-30 DIAGNOSIS — R079 Chest pain, unspecified: Secondary | ICD-10-CM

## 2016-10-30 DIAGNOSIS — I714 Abdominal aortic aneurysm, without rupture, unspecified: Secondary | ICD-10-CM

## 2016-10-30 DIAGNOSIS — R490 Dysphonia: Secondary | ICD-10-CM | POA: Diagnosis not present

## 2016-10-30 NOTE — Progress Notes (Signed)
Subjective:    Patient ID: Fernando Moyer, male    DOB: 04/05/1933, 83 y.o.   MRN: [id number]  DOS:  10/30/2016 Type of visit - description : Routine checkup Interval history: Since the last visit is doing about the same. Continue with hoarseness and sore throat. Continue with anterior chest pain Labs and ultrasound since the last visit are reviewed with the patient.    Review of Systems   Past Medical History:  Diagnosis Date  . AAA (abdominal aortic aneurysm) ([place]) 05/2008   PER CARDIAC CATH 3.5 X 3. 5 CM  . Aortic stenosis 05/2008   PER CARDIAC CATH, MILD, MITRAL STENOSIS  . Back pain    cause unknown but does get Cortisone injection every now and then  . Benign neoplasm of colon   . Bowen's disease    right arm BX: referred to dermatology  . Carotid artery occlusion    last u/s 3-11...Marland Kitchenper vasc.surgery 2007 cath: nonobstructive CAD. mil;d MS, trivial AoS, small AAA,  . Cataract    RIGHT removed  . Chronic back pain   . COPD (chronic obstructive pulmonary disease) ([place])   . Coronary artery disease 05/2008   MILD, MEDICAL MANAGEMENT  . Diabetes mellitus ([place]) 09/07/2011   no per pt- no medications  . Dyspnea    with exertion only  . GERD with stricture    w/hx of stricture  . History of nuclear stress test    Myoview 6/18: EF 51, normal perfusion; Low Risk  . Hyperlipidemia    takes Pravastatin daily  . Mitral stenosis 05/2008   PER CARDIAC CATH  . Myocardial infarction ([place]) 2012  . Peripheral neuropathy    right thigh; "it's been that way for years"  . Pulmonary hypertension ([place])   . PVD (peripheral vascular disease) ([place]) 12/07   per cath 12/07....iliac  . Stricture and stenosis of esophagus     Past Surgical History:  Procedure Laterality Date  . AORTIC VALVE REPLACEMENT  08/17/2011   Procedure: AORTIC VALVE REPLACEMENT (AVR);  Surgeon: Gaye Pollack, MD;  Location: [facility];  Service: Open Heart Surgery;  Laterality: N/A;  . APPENDECTOMY    . CARDIAC  CATHETERIZATION  02/2011   LAD 50, CFX OK, RCA40, EF 55%; PCWP 17, AoV  0.9 cm squared  . ENDARTERECTOMY  08-2010   Right carotid endarterectomy    . ESOPHAGEAL DILATION    . ESOPHAGOGASTRODUODENOSCOPY N/A 10/29/2015   Procedure: ESOPHAGOGASTRODUODENOSCOPY (EGD);  Surgeon: Jerene Bears, MD;  Location: [facility] ENDOSCOPY;  Service: Endoscopy;  Laterality: N/A;  . ESOPHAGOGASTRODUODENOSCOPY (EGD) WITH PROPOFOL N/A 11/10/2015   Procedure: ESOPHAGOGASTRODUODENOSCOPY (EGD) WITH PROPOFOL;  Surgeon: Milus Banister, MD;  Location: [facility] ENDOSCOPY;  Service: Endoscopy;  Laterality: N/A;  . ESOPHAGOGASTRODUODENOSCOPY (EGD) WITH PROPOFOL N/A 11/24/2015   Procedure: ESOPHAGOGASTRODUODENOSCOPY (EGD) WITH PROPOFOL;  Surgeon: Milus Banister, MD;  Location: [facility] ENDOSCOPY;  Service: Endoscopy;  Laterality: N/A;  dil  . eye lid surgery Bilateral ~ 07-2015  . EYE SURGERY  10/14/13   cataract surgery LEFT eye   . LARYNGOSCOPY  08/29/2016   transnasal  . LEFT AND RIGHT HEART CATHETERIZATION WITH CORONARY ANGIOGRAM N/A 02/23/2011   Procedure: LEFT AND RIGHT HEART CATHETERIZATION WITH CORONARY ANGIOGRAM;  Surgeon: [name], MD;  Location: [facility] CATH LAB;  Service: Cardiovascular;  Laterality: N/A;  . STERNAL WIRES REMOVAL N/A 05/01/2013   Procedure: STERNAL WIRES REMOVAL;  Surgeon: Gaye Pollack, MD;  Location: [facility];  Service: Thoracic;  Laterality: N/A;  Social History   Social History  . Marital status: Widowed    Spouse name: N/A  . Number of children: 2  . Years of education: N/A   Occupational History  . 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 Smoking status: Former Smoker    Packs/day: 2.00    Years: 30.00    Types: Cigarettes    Quit date: 04/03/1987  . Smokeless tobacco: Never Used     Comment: quit smoking in 1989  . Alcohol use No  . Drug use: No  . Sexual activity: Not on file   Other Topics Concern  . Not  on file   Social History Narrative   LIVES BY HIMSELF    HAS 2 GROWN SONS, 5 G-KIDS    Lenna Sciara is her granddaughter, she is a Art therapist for the school system, usually comes with him for his appointments             Allergies as of 10/30/2016   No Known Allergies     Medication List       Accurate as of 10/30/16 11:59 PM. Always use your most recent med list.          acetaminophen 500 MG tablet Commonly known as:  TYLENOL Take 1,000 mg by mouth every 6 (six) hours as needed (pain). Reported on 04/21/2015   ELIQUIS 5 MG Tabs tablet Generic drug:  apixaban TAKE 1 TABLET TWICE A DAY   ferrous sulfate 325 (65 FE) MG tablet Commonly known as:  FERROUSUL Take 1 tablet (325 mg total) by mouth 2 (two) times daily with a meal.   furosemide 40 MG tablet Commonly known as:  LASIX TAKE 1 TABLET DAILY   KLOR-CON M20 20 MEQ tablet Generic drug:  potassium chloride SA TAKE 1 TABLET DAILY   meclizine 25 MG tablet Commonly known as:  ANTIVERT TAKE 1 TABLET 3 TIMES A DAY AS NEEDED FOR DIZZINESS   omeprazole 40 MG capsule Commonly known as:  PRILOSEC Take 1 capsule (40 mg total) by mouth 2 (two) times daily.   pravastatin 40 MG tablet Commonly known as:  PRAVACHOL Take 1 tablet (40 mg total) by mouth daily.   traMADol 50 MG tablet Commonly known as:  ULTRAM Take 1 tablet (50 mg total) by mouth every 12 (twelve) hours as needed. for pain          Objective:   Physical Exam BP 128/78 (BP Location: Left Arm, Patient Position: Sitting, Cuff Size: Normal)   Pulse 78   Temp 97.6 F (36.4 C) (Oral)   Resp 14   Ht 5\' 7"  (1.702 m)   Wt 204 lb 8 oz (92.8 kg)   SpO2 97%   BMI 32.03 kg/m  General:   Well developed, well nourished . NAD.  HEENT:  Normocephalic . Face symmetric, atraumatic. + Hoarseness Neck: No mass or LAD Lungs:  CTA B Normal respiratory effort, no intercostal retractions, no accessory muscle use. Heart: RRR,  no murmur.  No pretibial edema  bilaterally  Skin: Not pale. Not jaundice Neurologic:  alert & oriented X3.  Speech normal, gait appropriate for age and unassisted Psych--  Cognition and judgment appear intact.  Cooperative with normal attention span and concentration.  Behavior appropriate. No anxious or depressed appearing.      Assessment & Plan:   Assessment DM  w/ neuropathy (feet  burning, nl exam) COPD Hyperlipidemia GERD with stricture, EGD x 3 ~ July 2017  CV: --CAD,  Last myoview 2014 --Carotid artery disease-- R CEA 2012 --A. Fib-- paroxysmal - eliquis  --Aortic valve replacement (bio) --PVD: AAA, iliAortic valve, subclavian artery stenosis  --LM:[facility] open heart surgery 2013. CT chest 2014 no PE, gallbladder stones. MSK-- back pain, sees Dr Nelva Bush, tramadol per PCP skin cancer  PLAN  Infrarenal AAA:  Had a ultrasound 7 -2-18,excerpts  stable dimensions of infrarenal AAA, measuring 3.6 cm x 3.7 cm. Normal caliber common and external iliac arteries, bilaterally. Chronic right common iliac artery occlusion. >50% stenosis of the left common iliac artery.  Repeat 1 year  Chest pain: Unchanged, likely GI related, to have a EGD in few days. Hoarseness: Ongoing problem, s/p ENT eval ,  After GI eval, if he is not improving, will consider further investigation. CT chest?. Esophageal stricture: To have a EGD soon. Anemia: Last hemoglobin satisfactory, on iron. RTC 3-4 months, CPX

## 2016-10-30 NOTE — Progress Notes (Deleted)
Subjective:   Fernando Moyer is a 83 y.o. male who presents for Medicare Annual/Subsequent preventive examination.  Review of Systems:  No ROS.  Medicare Wellness Visit. Additional risk factors are reflected in the social history.    Sleep patterns:   Home Safety/Smoke Alarms: Feels safe in home. Smoke alarms in place.  Living environment; residence and Firearm Safety:  [place] Safety/Bike Helmet: Wears seat belt.   Counseling:   Eye Exam-  Dental-  Male:   CCS-  No longer doing routine screening due to age.   PSA-  Lab Results  Component Value Date   PSA 3.56 06/17/2014   PSA 3.68 01/17/2011   PSA 2.62 02/01/2006       Objective:    Vitals: There were no vitals taken for this visit.  There is no height or weight on file to calculate BMI.  Tobacco History  Smoking Status  . Former Smoker  . Packs/day: 2.00  . Years: 30.00  . Types: Cigarettes  . Quit date: 04/03/1987  Smokeless Tobacco  . Never Used    Comment: quit smoking in 1989     Counseling given: Not Answered   Past Medical History:  Diagnosis Date  . AAA (abdominal aortic aneurysm) ([place]) 05/2008   PER CARDIAC CATH 3.5 X 3. 5 CM  . Aortic stenosis 05/2008   PER CARDIAC CATH, MILD, MITRAL STENOSIS  . Back pain    cause unknown but does get Cortisone injection every now and then  . Benign neoplasm of colon   . Bowen's disease    right arm BX: referred to dermatology  . Carotid artery occlusion    last u/s 3-11...Marland Kitchenper vasc.surgery 2007 cath: nonobstructive CAD. mil;d MS, trivial AoS, small AAA,  . Cataract    RIGHT removed  . Chronic back pain   . COPD (chronic obstructive pulmonary disease) ([place])   . Coronary artery disease 05/2008   MILD, MEDICAL MANAGEMENT  . Diabetes mellitus ([place]) 09/07/2011   no per pt- no medications  . Dyspnea    with exertion only  . GERD with stricture    w/hx of stricture  . History of nuclear stress test    Myoview 6/18: EF 51, normal perfusion; Low Risk  .  Hyperlipidemia    takes Pravastatin daily  . Mitral stenosis 05/2008   PER CARDIAC CATH  . Myocardial infarction ([place]) 2012  . Peripheral neuropathy    right thigh; "it's been that way for years"  . Pulmonary hypertension ([place])   . PVD (peripheral vascular disease) ([place]) 12/07   per cath 12/07....iliac  . Stricture and stenosis of esophagus    Past Surgical History:  Procedure Laterality Date  . AORTIC VALVE REPLACEMENT  08/17/2011   Procedure: AORTIC VALVE REPLACEMENT (AVR);  Surgeon: Gaye Pollack, MD;  Location: [facility];  Service: Open Heart Surgery;  Laterality: N/A;  . APPENDECTOMY    . CARDIAC CATHETERIZATION  02/2011   LAD 50, CFX OK, RCA40, EF 55%; PCWP 17, AoV  0.9 cm squared  . ENDARTERECTOMY  08-2010   Right carotid endarterectomy    . ESOPHAGEAL DILATION    . ESOPHAGOGASTRODUODENOSCOPY N/A 10/29/2015   Procedure: ESOPHAGOGASTRODUODENOSCOPY (EGD);  Surgeon: Jerene Bears, MD;  Location: [facility] ENDOSCOPY;  Service: Endoscopy;  Laterality: N/A;  . ESOPHAGOGASTRODUODENOSCOPY (EGD) WITH PROPOFOL N/A 11/10/2015   Procedure: ESOPHAGOGASTRODUODENOSCOPY (EGD) WITH PROPOFOL;  Surgeon: Milus Banister, MD;  Location: [facility] ENDOSCOPY;  Service: Endoscopy;  Laterality: N/A;  . ESOPHAGOGASTRODUODENOSCOPY (EGD)  WITH PROPOFOL N/A 11/24/2015   Procedure: ESOPHAGOGASTRODUODENOSCOPY (EGD) WITH PROPOFOL;  Surgeon: Milus Banister, MD;  Location: [facility] ENDOSCOPY;  Service: Endoscopy;  Laterality: N/A;  dil  . eye lid surgery Bilateral ~ 07-2015  . EYE SURGERY  10/14/13   cataract surgery LEFT eye   . LARYNGOSCOPY  08/29/2016   transnasal  . LEFT AND RIGHT HEART CATHETERIZATION WITH CORONARY ANGIOGRAM N/A 02/23/2011   Procedure: LEFT AND RIGHT HEART CATHETERIZATION WITH CORONARY ANGIOGRAM;  Surgeon: [name], MD;  Location: [facility] CATH LAB;  Service: Cardiovascular;  Laterality: N/A;  . STERNAL WIRES REMOVAL N/A 05/01/2013   Procedure: STERNAL WIRES REMOVAL;  Surgeon: Gaye Pollack, MD;  Location: [facility] OR;   Service: Thoracic;  Laterality: N/A;   Family History  Problem Relation Age of Onset  . Heart attack Father 94       MI  . Prostate cancer Brother 72  . Colon cancer Neg Hx   . Anesthesia problems Neg Hx   . Hypotension Neg Hx   . Malignant hyperthermia Neg Hx   . Pseudochol deficiency Neg Hx   . Stomach cancer Neg Hx   . Esophageal cancer Neg Hx   . Rectal cancer Neg Hx   . Pancreatic cancer Neg Hx    History  Sexual Activity  . Sexual activity: Not on file    Outpatient Encounter Prescriptions as of 10/30/2016  Medication Sig  . acetaminophen (TYLENOL) 500 MG tablet Take 1,000 mg by mouth every 6 (six) hours as needed (pain). Reported on 04/21/2015  . ELIQUIS 5 MG TABS tablet TAKE 1 TABLET TWICE A DAY  . ferrous sulfate (FERROUSUL) 325 (65 FE) MG tablet Take 1 tablet (325 mg total) by mouth 2 (two) times daily with a meal.  . furosemide (LASIX) 40 MG tablet TAKE 1 TABLET DAILY  . KLOR-CON M20 20 MEQ tablet TAKE 1 TABLET DAILY  . meclizine (ANTIVERT) 25 MG tablet TAKE 1 TABLET 3 TIMES A DAY AS NEEDED FOR DIZZINESS (Patient not taking: Reported on 10/23/2016)  . omeprazole (PRILOSEC) 40 MG capsule Take 1 capsule (40 mg total) by mouth 2 (two) times daily.  . pravastatin (PRAVACHOL) 40 MG tablet Take 1 tablet (40 mg total) by mouth daily.  . traMADol (ULTRAM) 50 MG tablet Take 1 tablet (50 mg total) by mouth every 12 (twelve) hours as needed. for pain   Facility-Administered Encounter Medications as of 10/30/2016  Medication  . 0.9 %  sodium chloride infusion    Activities of Daily Living In your present state of health, do you have any difficulty performing the following activities: 12/27/2015  Hearing? Y  Vision? N  Difficulty concentrating or making decisions? N  Walking or climbing stairs? N  Dressing or bathing? N  Doing errands, shopping? N  Some recent data might be hidden    Patient Care Team: Colon Branch, MD as PCP - General Angelia Mould, MD as  Consulting Physician (Vascular Surgery) [name], MD as Consulting Physician (Cardiology) Murvin Donning, MD (Dermatology) Calvert Cantor, MD as Consulting Physician (Ophthalmology) Suella Broad, MD as Consulting Physician (Physical Medicine and Rehabilitation) Melissa Montane, MD as Consulting Physician (Otolaryngology)   Assessment:    Physical assessment deferred to PCP.  Exercise Activities and Dietary recommendations   Diet (meal preparation, eat out, water intake, caffeinated beverages, dairy products, fruits and vegetables): {Desc; diets:16563} Breakfast: Lunch:  Dinner:      Goals    None     Fall Risk Fall  Risk  12/27/2015 08/22/2015 04/21/2015 10/19/2014 06/29/2013  Falls in the past year? No No No No No   Depression Screen PHQ 2/9 Scores 12/27/2015 08/22/2015 04/21/2015 10/19/2014  PHQ - 2 Score 0 0 0 0    Cognitive Function        Immunization History  Administered Date(s) Administered  . Influenza Split 01/17/2011  . Influenza Whole 05/06/2009  . Influenza, High Dose Seasonal PF 04/21/2015, 12/27/2015  . Influenza, Seasonal, Injecte, Preservative Fre 04/18/2012  . Influenza,inj,Quad PF,36+ Mos 01/20/2013, 02/01/2014  . Pneumococcal Conjugate-13 06/17/2014  . Pneumococcal Polysaccharide-23 03/02/2006, 12/07/2011  . Td 04/02/1998  . Tdap 01/17/2011  . Zoster 09/30/2013   Screening Tests Health Maintenance  Topic Date Due  . URINE MICROALBUMIN  04/20/2016  . OPHTHALMOLOGY EXAM  08/04/2016  . FOOT EXAM  08/21/2016  . INFLUENZA VACCINE  10/31/2016  . HEMOGLOBIN A1C  02/03/2017  . TETANUS/TDAP  01/16/2021  . PNA vac Low Risk Adult  Completed      Plan:   ***  I have personally reviewed and noted the following in the patient's chart:   . Medical and social history . Use of alcohol, tobacco or illicit drugs  . Current medications and supplements . Functional ability and status . Nutritional status . Physical activity . Advanced  directives . List of other physicians . Hospitalizations, surgeries, and ER visits in previous 12 months . Vitals . Screenings to include cognitive, depression, and falls . Referrals and appointments  In addition, I have reviewed and discussed with patient certain preventive protocols, quality metrics, and best practice recommendations. A written personalized care plan for preventive services as well as general preventive health recommendations were provided to patient.     Naaman Plummer [place], [place]  10/30/2016

## 2016-10-30 NOTE — Patient Instructions (Addendum)
Please come back in 3-4 months , physical exam

## 2016-10-31 NOTE — Telephone Encounter (Signed)
Called to discuss with granddaughter.  After discussing details, apologizes were offered.  Granddaughter was content and plans to keep her appt with Dr. Nani Ravens tomorrow.

## 2016-10-31 NOTE — Assessment & Plan Note (Signed)
Infrarenal AAA:  Had a ultrasound 7 -2-18,excerpts  stable dimensions of infrarenal AAA, measuring 3.6 cm x 3.7 cm. Normal caliber common and external iliac arteries, bilaterally. Chronic right common iliac artery occlusion. >50% stenosis of the left common iliac artery.  Repeat 1 year  Chest pain: Unchanged, likely GI related, to have a EGD in few days. Hoarseness: Ongoing problem, s/p ENT eval ,  After GI eval, if he is not improving, will consider further investigation. CT chest?. Esophageal stricture: To have a EGD soon. Anemia: Last hemoglobin satisfactory, on iron. RTC 3-4 months, CPX

## 2016-10-31 NOTE — Telephone Encounter (Signed)
Per appt notes, he was scheduled for 10 am w/ Glenard Haring for AWV, unsure who left message for 1030. Either way his appt w/ you wasn't until 11. Which I brought him back at 11:08AM. Which he then stated "about dang(but explicit) time." That's when I informed him that he was late to see Angel at 10 for AWV and then his regular appt with you wasn't until 11AM. Pt arrived at 10:23 AM. I apologized for the confusion and offered him juice, water while he waited, which he refused.

## 2016-11-02 ENCOUNTER — Encounter: Payer: Self-pay | Admitting: Gastroenterology

## 2016-11-02 ENCOUNTER — Ambulatory Visit (AMBULATORY_SURGERY_CENTER): Payer: Medicare Other | Admitting: Gastroenterology

## 2016-11-02 VITALS — BP 124/63 | HR 86 | Temp 97.7°F | Resp 17 | Ht 67.0 in | Wt 208.0 lb

## 2016-11-02 DIAGNOSIS — R1319 Other dysphagia: Secondary | ICD-10-CM | POA: Diagnosis present

## 2016-11-02 DIAGNOSIS — K449 Diaphragmatic hernia without obstruction or gangrene: Secondary | ICD-10-CM

## 2016-11-02 DIAGNOSIS — I272 Pulmonary hypertension, unspecified: Secondary | ICD-10-CM | POA: Diagnosis not present

## 2016-11-02 DIAGNOSIS — I2510 Atherosclerotic heart disease of native coronary artery without angina pectoris: Secondary | ICD-10-CM | POA: Diagnosis not present

## 2016-11-02 DIAGNOSIS — R1310 Dysphagia, unspecified: Secondary | ICD-10-CM | POA: Diagnosis not present

## 2016-11-02 DIAGNOSIS — K222 Esophageal obstruction: Secondary | ICD-10-CM | POA: Diagnosis not present

## 2016-11-02 MED ORDER — SODIUM CHLORIDE 0.9 % IV SOLN: 500.0000 mL | INTRAVENOUS | Status: DC

## 2016-11-02 NOTE — Progress Notes (Signed)
To recovery, report to McCoy, RN, VSS 

## 2016-11-02 NOTE — Op Note (Signed)
[facility] Patient Name: Fernando Moyer Procedure Date: 11/02/2016 8:33 AM MRN: [id number] Endoscopist: Milus Banister , MD Age: 83 Referring MD:  Date of Birth: [birth year] Gender: Male Account #: [id number] Procedure:                Upper GI endoscopy Indications:              Dysphagia history of esophageal stricture; biggest                            complaint is sore throat and hoarseness however Medicines:                Monitored Anesthesia Care Procedure:                Pre-Anesthesia Assessment:                           - Prior to the procedure, a History and Physical                            was performed, and patient medications and                            allergies were reviewed. The patient's tolerance of                            previous anesthesia was also reviewed. The risks                            and benefits of the procedure and the sedation                            options and risks were discussed with the patient.                            All questions were answered, and informed consent                            was obtained. Prior Anticoagulants: The patient has                            taken Eliquis (apixaban), last dose was 3 days                            prior to procedure. ASA Grade Assessment: III - A                            patient with severe systemic disease. After                            reviewing the risks and benefits, the patient was                            deemed in satisfactory condition to undergo the  procedure.                           After obtaining informed consent, the endoscope was                            passed under direct vision. Throughout the                            procedure, the patient's blood pressure, pulse, and                            oxygen saturations were monitored continuously. The                            Endoscope was introduced through the mouth,  and                            advanced to the second part of duodenum. The upper                            GI endoscopy was accomplished without difficulty.                            The patient tolerated the procedure well. Scope In: Scope Out: Findings:                 One mild benign-appearing, intrinsic stenosis was                            found at the gastroesophageal junction (peptic                            stricture vs. thick Schatzki's ring). A TTS dilator                            was passed through the scope. Dilation with a                            16-17-18 mm balloon dilator was performed to 17 mm.                            There was the typicical superfical tear and minor,                            self limited bleeding following dilation.                           A medium-sized hiatal hernia was present.                           Mild inflammation was found in the entire examined                            stomach.  The examined duodenum was normal. Complications:            No immediate complications. Estimated blood loss:                            None. Estimated Blood Loss:     Estimated blood loss: none. Impression:               - Hiatal hernia.                           - Peptic appearing GE junction stricture, dilated                            to 3mm today with TTS balloon.                           - Mild pan-gastritis. Recommendation:           - Patient has a contact number available for                            emergencies. The signs and symptoms of potential                            delayed complications were discussed with the                            patient. Return to normal activities tomorrow.                            Written discharge instructions were provided to the                            patient.                           - Resume previous diet.                           - Continue present  medications. You should be                            taking PPI (omperazole 40mg  pills) one pill 20-30                            min prior to breakfast and dinner meals. Please                            also start taking ranitidine 150mg  (OTC) at bedtime                            every night.                           - It is unlikely that these GI findings explain  your severe sore throat or hoarsenss, please follow                            up with your PCP, ENT to consider other testing for                            those issues.                           - No repeat upper endoscopy. Milus Banister, MD 11/02/2016 9:01:06 AM This report has been signed electronically.

## 2016-11-02 NOTE — Progress Notes (Signed)
Pt's states no medical or surgical changes since previsit or office visit. 

## 2016-11-02 NOTE — Patient Instructions (Signed)
Discharge instructions given. Handout on a dilatation diet. Resume previous medications. Follow recommendations on procedure report. YOU HAD AN ENDOSCOPIC PROCEDURE TODAY AT [facility]:   Refer to the procedure report that was given to you for any specific questions about what was found during the examination.  If the procedure report does not answer your questions, please call your gastroenterologist to clarify.  If you requested that your care partner not be given the details of your procedure findings, then the procedure report has been included in a sealed envelope for you to review at your convenience later.  YOU SHOULD EXPECT: Some feelings of bloating in the abdomen. Passage of more gas than usual.  Walking can help get rid of the air that was put into your GI tract during the procedure and reduce the bloating. If you had a lower endoscopy (such as a colonoscopy or flexible sigmoidoscopy) you may notice spotting of blood in your stool or on the toilet paper. If you underwent a bowel prep for your procedure, you may not have a normal bowel movement for a few days.  Please Note:  You might notice some irritation and congestion in your nose or some drainage.  This is from the oxygen used during your procedure.  There is no need for concern and it should clear up in a day or so.  SYMPTOMS TO REPORT IMMEDIATELY:    Following upper endoscopy (EGD)  Vomiting of blood or coffee ground material  New chest pain or pain under the shoulder blades  Painful or persistently difficult swallowing  New shortness of breath  Fever of 100F or higher  Black, tarry-looking stools  For urgent or emergent issues, a gastroenterologist can be reached at any hour by calling [phone].   DIET:  We do recommend a small meal at first, but then you may proceed to your regular diet.  Drink plenty of fluids but you should avoid alcoholic beverages for 24 hours.  ACTIVITY:  You should plan to  take it easy for the rest of today and you should NOT DRIVE or use heavy machinery until tomorrow (because of the sedation medicines used during the test).    FOLLOW UP: Our staff will call the number listed on your records the next business day following your procedure to check on you and address any questions or concerns that you may have regarding the information given to you following your procedure. If we do not reach you, we will leave a message.  However, if you are feeling well and you are not experiencing any problems, there is no need to return our call.  We will assume that you have returned to your regular daily activities without incident.  If any biopsies were taken you will be contacted by phone or by letter within the next 1-3 weeks.  Please call us at [phone] if you have not heard about the biopsies in 3 weeks.    SIGNATURES/CONFIDENTIALITY: You and/or your care partner have signed paperwork which will be entered into your electronic medical record.  These signatures attest to the fact that that the information above on your After Visit Summary has been reviewed and is understood.  Full responsibility of the confidentiality of this discharge information lies with you and/or your care-partner.

## 2016-11-02 NOTE — Progress Notes (Signed)
Called to room to assist during endoscopic procedure.  Patient ID and intended procedure confirmed with present staff. Received instructions for my participation in the procedure from the performing physician.  

## 2016-11-05 ENCOUNTER — Encounter: Payer: Self-pay | Admitting: Gastroenterology

## 2016-11-05 ENCOUNTER — Encounter: Payer: Self-pay | Admitting: Internal Medicine

## 2016-11-05 ENCOUNTER — Telehealth: Payer: Self-pay | Admitting: *Deleted

## 2016-11-05 DIAGNOSIS — R490 Dysphonia: Secondary | ICD-10-CM

## 2016-11-05 DIAGNOSIS — R079 Chest pain, unspecified: Secondary | ICD-10-CM

## 2016-11-05 NOTE — Telephone Encounter (Signed)
  Follow up Call-  Call back number 11/02/2016 07/31/2016 03/22/2015  Post procedure Call Back phone  # [phone] cell [phone]  Permission to leave phone message Yes No Yes  Some recent data might be hidden     Patient questions:  Do you have a fever, pain , or abdominal swelling? No. Pain Score  0 *  Have you tolerated food without any problems? Yes.    Have you been able to return to your normal activities? Yes.    Do you have any questions about your discharge instructions: Diet   No. Medications  No. Follow up visit  No.  Do you have questions or concerns about your Care? No.  Actions: * If pain score is 4 or above: No action needed, pain <4.

## 2016-11-06 ENCOUNTER — Encounter: Payer: Self-pay | Admitting: Family

## 2016-11-07 ENCOUNTER — Ambulatory Visit (INDEPENDENT_AMBULATORY_CARE_PROVIDER_SITE_OTHER): Payer: Medicare Other | Admitting: Family

## 2016-11-07 ENCOUNTER — Encounter: Payer: Self-pay | Admitting: Family

## 2016-11-07 VITALS — BP 110/63 | HR 74 | Temp 97.2°F | Resp 18 | Ht 67.0 in | Wt 206.0 lb

## 2016-11-07 DIAGNOSIS — I771 Stricture of artery: Secondary | ICD-10-CM | POA: Diagnosis not present

## 2016-11-07 DIAGNOSIS — R1319 Other dysphagia: Secondary | ICD-10-CM | POA: Diagnosis not present

## 2016-11-07 DIAGNOSIS — I714 Abdominal aortic aneurysm, without rupture, unspecified: Secondary | ICD-10-CM

## 2016-11-07 DIAGNOSIS — I745 Embolism and thrombosis of iliac artery: Secondary | ICD-10-CM | POA: Diagnosis not present

## 2016-11-07 DIAGNOSIS — I6523 Occlusion and stenosis of bilateral carotid arteries: Secondary | ICD-10-CM | POA: Diagnosis not present

## 2016-11-07 DIAGNOSIS — R490 Dysphonia: Secondary | ICD-10-CM

## 2016-11-07 DIAGNOSIS — R0600 Dyspnea, unspecified: Secondary | ICD-10-CM

## 2016-11-07 NOTE — Progress Notes (Signed)
Chief Complaint: Follow up Extracranial Carotid Artery Stenosis   History of Present Illness  Fernando Moyer is a 83 y.o. male who is s/p right carotid endarterectomy in May 2012 by Dr. Scot Dock. We've been following a moderate left carotid stenosis.  He returns today at the request of Dr. Jimmye Norman, his chiropractor, and his grand-daughter, for 4 month hx of dysphagia, trouble eating, food hangs in his chest. He had an endoscopy on 11-24-15: Benign-appearing esophageal stenosis. Dilated to                            73mm with TTS balloon dilator.                           - Medium-sized hiatal hernia.                           - The examination was otherwise normal.  He had a myocardial perfusion scan in June 2018 which was normal.   EPIC e-mail to pt's grand-daughter dated 11-06-16, from Dr. Larose Kells: "I review the report of the EGD, he has a stricture, they dilated it and recommended to take omeprazole twice a day and ranitidine.  If that has not help him, we need to move to the next step which is at a CT of the chest to see if there is any explanation for the hoarseness and chest pain.  If that is negative we'll have to keep searching for the cause of his symptoms, including consult in the ENT doctor again or get a second opinion  I understand your frustration, I wish I could tell him what the problem is without further testing   expect a phone call regards the CT of the chest.  JP" Review of imaging indicates that CT of chest was ordered today by Dr. Larose Kells.   Pt also c/o worsening dyspnea for about 1 1/2 years. Hoarseness is also worsening.  He denies any lateralizing symptoms, denies sudden changes in vision, or speech difficulties other than hoarseness.   Walking worsens his dyspnea, he denies claudication type symptoms, but he is able to walk only about 100 feet to his mailbox before becoming dyspneic.  Before his worsening dyspnea, he was having bilateral calf pain after walking  about 300 feet, resolved with rest. He is able to lie flat, he states, is initially more dyspneic, then dyspnea subsides, he uses one pillow in bed.   He has a small AAA that his cardiologist had been following (review of records, see report below). He had his aortic valve replaced in 2013, (bovine sourced, per pt). Dr. Burt Knack notes on 12/13/14 that pt has paroxysmal atrial fib.  He has known lumbar spine issues with radiculopathy sx's in both legs; states he has tried ESI's with little success per pt; states he is not a surgery candidate for this, sees Dr. Nelva Bush for this. He was riding his stationary bike, 15 minutes daily.   Pt Diabetic: yesA1C was 5.8 on 08-03-16 (reveiw of records) Pt smoker: quit in 1989, started about age 45  Pt meds include: Statin : yes ASA: no Other anticoagulants/antiplatelets: Eliquis, paroxysmal atrial fib.     Past Medical History:  Diagnosis Date  . AAA (abdominal aortic aneurysm) ([place]) 05/2008   PER CARDIAC CATH 3.5 X 3. 5 CM  . Aortic stenosis 05/2008   PER CARDIAC CATH, MILD,  MITRAL STENOSIS  . Back pain    cause unknown but does get Cortisone injection every now and then  . Benign neoplasm of colon   . Bowen's disease    right arm BX: referred to dermatology  . Carotid artery occlusion    last u/s 3-11...Marland Kitchenper vasc.surgery 2007 cath: nonobstructive CAD. mil;d MS, trivial AoS, small AAA,  . Cataract    RIGHT removed  . Chronic back pain   . COPD (chronic obstructive pulmonary disease) ([place])   . Coronary artery disease 05/2008   MILD, MEDICAL MANAGEMENT  . Diabetes mellitus ([place]) 09/07/2011   no per pt- no medications  . Dyspnea    with exertion only  . GERD with stricture    w/hx of stricture  . History of nuclear stress test    Myoview 6/18: EF 51, normal perfusion; Low Risk  . Hyperlipidemia    takes Pravastatin daily  . Mitral stenosis 05/2008   PER CARDIAC CATH  . Myocardial infarction ([place]) 2012  . Peripheral neuropathy    right  thigh; "it's been that way for years"  . Pulmonary hypertension ([place])   . PVD (peripheral vascular disease) ([place]) 12/07   per cath 12/07....iliac  . Stricture and stenosis of esophagus     Social History Social History  Substance Use Topics  . Smoking status: Former Smoker    Packs/day: 2.00    Years: 30.00    Types: Cigarettes    Quit date: 04/03/1987  . Smokeless tobacco: Never Used     Comment: quit smoking in 1989  . Alcohol use No    Family History Family History  Problem Relation Age of Onset  . Heart attack Father 26       MI  . Prostate cancer Brother 71  . Colon cancer Neg Hx   . Anesthesia problems Neg Hx   . Hypotension Neg Hx   . Malignant hyperthermia Neg Hx   . Pseudochol deficiency Neg Hx   . Stomach cancer Neg Hx   . Esophageal cancer Neg Hx   . Rectal cancer Neg Hx   . Pancreatic cancer Neg Hx     Surgical History Past Surgical History:  Procedure Laterality Date  . AORTIC VALVE REPLACEMENT  08/17/2011   Procedure: AORTIC VALVE REPLACEMENT (AVR);  Surgeon: Gaye Pollack, MD;  Location: [facility];  Service: Open Heart Surgery;  Laterality: N/A;  . APPENDECTOMY    . CARDIAC CATHETERIZATION  02/2011   LAD 50, CFX OK, RCA40, EF 55%; PCWP 17, AoV  0.9 cm squared  . ENDARTERECTOMY  08-2010   Right carotid endarterectomy    . ESOPHAGEAL DILATION    . ESOPHAGOGASTRODUODENOSCOPY N/A 10/29/2015   Procedure: ESOPHAGOGASTRODUODENOSCOPY (EGD);  Surgeon: Jerene Bears, MD;  Location[facility];  Service: Endoscopy;  Laterality: N/A;  . ESOPHAGOGASTRODUODENOSCOPY (EGD) WITH PROPOFOL N/A 11/10/2015   Procedure: ESOPHAGOGASTRODUODENOSCOPY (EGD) WITH PROPOFOL;  Surgeon: Milus Banister, MD;  Location: [facility];  Service: Endoscopy;  Laterality: N/A;  . ESOPHAGOGASTRODUODENOSCOPY (EGD) WITH PROPOFOL N/A 11/24/2015   Procedure: ESOPHAGOGASTRODUODENOSCOPY (EGD) WITH PROPOFOL;  Surgeon: Milus Banister, MD;  Location: [facility];  Service: Endoscopy;  Laterality: N/A;   dil  . eye lid surgery Bilateral ~ 07-2015  . EYE SURGERY  10/14/13   cataract surgery LEFT eye   . LARYNGOSCOPY  08/29/2016   transnasal  . LEFT AND RIGHT HEART CATHETERIZATION WITH CORONARY ANGIOGRAM N/A 02/23/2011   Procedure: LEFT AND RIGHT HEART CATHETERIZATION WITH CORONARY ANGIOGRAM;  Surgeon: [name], MD;  Location[facility];  Service: Cardiovascular;  Laterality: N/A;  . STERNAL WIRES REMOVAL N/A 05/01/2013   Procedure: STERNAL WIRES REMOVAL;  Surgeon: Gaye Pollack, MD;  Location: [facility] OR;  Service: Thoracic;  Laterality: N/A;    No Known Allergies  Current Outpatient Prescriptions  Medication Sig Dispense Refill  . acetaminophen (TYLENOL) 500 MG tablet Take 1,000 mg by mouth every 6 (six) hours as needed (pain). Reported on 04/21/2015    . ELIQUIS 5 MG TABS tablet TAKE 1 TABLET TWICE A DAY 180 tablet 2  . ferrous sulfate (FERROUSUL) 325 (65 FE) MG tablet Take 1 tablet (325 mg total) by mouth 2 (two) times daily with a meal. 180 tablet 0  . furosemide (LASIX) 40 MG tablet TAKE 1 TABLET DAILY 90 tablet 2  . KLOR-CON M20 20 MEQ tablet TAKE 1 TABLET DAILY 90 tablet 2  . traMADol (ULTRAM) 50 MG tablet Take 1 tablet (50 mg total) by mouth every 12 (twelve) hours as needed. for pain 60 tablet 1  . meclizine (ANTIVERT) 25 MG tablet TAKE 1 TABLET 3 TIMES A DAY AS NEEDED FOR DIZZINESS (Patient not taking: Reported on 10/23/2016) 30 tablet 1  . omeprazole (PRILOSEC) 40 MG capsule Take 1 capsule (40 mg total) by mouth 2 (two) times daily. (Patient not taking: Reported on 11/07/2016) 30 capsule 1  . pravastatin (PRAVACHOL) 40 MG tablet Take 1 tablet (40 mg total) by mouth daily. (Patient not taking: Reported on 11/07/2016) 90 tablet 1   Current Facility-Administered Medications  Medication Dose Route Frequency Provider Last Rate Last Dose  . 0.9 %  sodium chloride infusion  500 mL Intravenous Continuous Nelida Meuse III, MD      . 0.9 %  sodium chloride infusion  500 mL Intravenous  Continuous Milus Banister, MD        Review of Systems : See HPI for pertinent positives and negatives.  Physical Examination  Vitals:   11/07/16 1208 11/07/16 1216  BP: (!) 156/68 110/63  Pulse: 74   Resp: 18   Temp: (!) 97.2 F (36.2 C)   TempSrc: Oral   SpO2: 91%   Weight: 206 lb (93.4 kg)   Height: 5\' 7"  (1.702 m)    Body mass index is 32.26 kg/m.  General: WDWN obese male in NAD GAIT:normal Eyes: PERRLA Pulmonary: Slightly labored at rest, slightly more labored with walking, fair air movement in all fields, CTAB, no rales, rhonchi, or wheezing.  Cardiac: Regular rhythm and rate,no detected murmur.  VASCULAR EXAM Carotid Bruits Right Left   Positive Positive   Aorta is not palpable. Radial pulses are 3+ right and 2+ left palpable   LE Pulses Right Left       FEMORAL  not palpable (obese)  not palpable   POPLITEAL not palpable  not palpable  POSTERIOR TIBIAL Not palpable  not palpable   DORSALIS PEDIS ANTERIOR TIBIAL not palpable  not palpable     Gastrointestinal: soft, nontender, BS WNL, no r/g, no palpable masses.  Musculoskeletal: no muscle atrophy/wasting. M/S 5/5 throughout, extremities without ischemic changes  Neurologic: A&O X 3; Appropriate Affect, speech is hoarse, CN 2-12 intact except is hard of hearing, pain and light touch intact in extremities, Motor exam as listed above     Assessment: XZAYVIER FAGIN is a 83 y.o. male who is s/p right carotid endarterectomy in May 2012. We've been following a moderate left carotid stenosis.  He has no hx  of stroke or TIA.  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 Accurate blood pressures will therefore be in the right arm.  He has bilateral calf claudication after walking about 300 feet, resolves with rest; claudication is  improving since he started daily use of his stationary bike and more walking.   According to 2015 aortoiliac duplex, he has known right common iliac artery occlusion and >50% stenosis of the right external and left common iliac arteries, and a small AAA. See Plan.  His atherosclerotic risk factors include currently controlled DM (not on any DM medications), mild CAD, former smoker, obesity, and paroxysmal atrial fib on Eliquis.   I discussed with Dr. Scot Dock pt most recent HPI, physical exam results, SaO2 of 91% on RA, evaluations for dysphagia and dyspnea, and that he has a CT chest requested by Dr. Larose Kells, to be scheduled. Pt is not having any stroke or TIA symptoms. Evaluation of his symptoms continues by his PCP with pending CT of chest.  If that is negative, consider evaluation for pulmonary embolism, defer to his PCP. His breath sound are clear on today's exam, but SAO2 is 91%.    DATA  Carotid Duplex (05-16-16): patent right carotid endarterectomy site with no evidence of hyperplasia or restenosis.  Left internal carotid artery velocities suggest a 40-59% stenosis. Right vertebral artery flow is antegrade, left is retrograde. Right subclavian artery waveforms are triphasic, left are biphasic. No significant change in comparison to the last exam on 04-20-15.     12/04/13 AAA Duplex at [facility], requested by Dr. Burt Knack: Technically challenging study. Probable stable dimensions of infrarenal AAA, measuring 3.3 cm x 3.6 cm. Normal caliber common and external iliac arteries, bilaterally. Chronic right common iliac artery occlusion. >50% stenosis of the right external and left common iliac arteries. Patent left external iliac artery.      Plan: Follow-up as already scheduled in February 2019 with Carotid Duplex scan, bilateral aortoiliac duplex, and ABI's. I advised pt to call 911 if his breathing worsens or if he develops sudden chest pain.    I discussed in depth with the patient the  nature of atherosclerosis, and emphasized the importance of maximal medical management including strict control of blood pressure, blood glucose, and lipid levels, obtaining regular exercise, and continued cessation of smoking.  The patient is aware that without maximal medical management the underlying atherosclerotic disease process will progress, limiting the benefit of any interventions. The patient was given information about stroke prevention and what symptoms should prompt the patient to seek immediate medical care. Thank you for allowing [place] to participate in this patient's care.  Fernando Chambers, RN, MSN, FNP-C [facility] Office: [phone]  Clinic Physician: Dickson/Early  11/07/16 12:21 PM

## 2016-11-07 NOTE — Telephone Encounter (Signed)
CT ordered. 

## 2016-11-07 NOTE — Telephone Encounter (Addendum)
Per Larose Kells, CT chest w/ contrast.

## 2016-11-07 NOTE — Telephone Encounter (Signed)
enter orders  Received: [place], MD  Damita Dunnings, [place]  Cc: Colon Branch, MD        CT chest with and without  DX hoarseness, chest pain

## 2016-11-07 NOTE — Patient Instructions (Signed)
Shortness of Breath, Adult Shortness of breath means you have trouble breathing. Your lungs are organs for breathing. Follow these instructions at home: Pay attention to any changes in your symptoms. Take these actions to help with your condition:  Do not smoke. Smoking can cause shortness of breath. If you need help to quit smoking, ask your doctor.  Avoid things that can make it harder to breathe, such as: ? Mold. ? Dust. ? Air pollution. ? Chemical smells. ? Things that can cause allergy symptoms (allergens), if you have allergies.  Keep your living space clean and free of mold and dust.  Rest as needed. Slowly return to your usual activities.  Take over-the-counter and prescription medicines, including oxygen and inhaled medicines, only as told by your doctor.  Keep all follow-up visits as told by your doctor. This is important.  Contact a doctor if:  Your condition does not get better as soon as expected.  You have a hard time doing your normal activities, even after you rest.  You have new symptoms. Get help right away if:  You have trouble breathing when you are resting.  You feel light-headed or you faint.  You have a cough that is not helped by medicines.  You cough up blood.  You have pain with breathing.  You have pain in your chest, arms, shoulders, or belly (abdomen).  You have a fever.  You cannot walk up stairs.  You cannot exercise the way you normally do. This information is not intended to replace advice given to you by your health care provider. Make sure you discuss any questions you have with your health care provider. Document Released: 09/05/2007 Document Revised: 04/05/2016 Document Reviewed: 04/05/2016 Elsevier Interactive Patient Education  2017 Elsevier Inc.     Hoarseness Hoarseness is any abnormal change in your voice.Hoarseness can make it difficult to speak. Your voice may sound raspy, breathy, or strained. Hoarseness is caused  by a problem with the vocal cords. The vocal cords are two bands of tissue inside your voice box (larynx). When you speak, your vocal cords move back and forth to create sound. The surfaces of your vocal cords need to be smooth for your voice to sound clear. Swelling or lumps on the vocal cords can cause hoarseness. Common causes of vocal cord problems include:  Upper airway infection.  A long-term cough.  Straining or overusing your voice.  Smoking.  Allergies.  Vocal cord growths.  Stomach acids that flow up from your stomach and irritate your vocal cords (gastroesophageal reflux).  Follow these instructions at home: Watch your condition for any changes. To ease any discomfort that you feel:  Rest your voice. Do not whisper. Whispering can cause muscle strain.  Do not speak in a loud or harsh voice that makes your hoarseness worse.  Do not use any tobacco products, including cigarettes, chewing tobacco, or electronic cigarettes. If you need help quitting, ask your health care provider.  Avoid secondhand smoke.  Do not eat foods that give you heartburn. Heartburn can make gastroesophageal reflux worse.  Do not drink coffee.  Do not drink alcohol.  Drink enough fluids to keep your urine clear or pale yellow.  Use a humidifier if the air in your home is dry.  Contact a health care provider if:  You have hoarseness that lasts longer than 3 weeks.  You almost lose or completelylose your voice for longer than 3 days.  You have pain when you swallow or try to talk.  You feel a lump in your neck. Get help right away if:  You have trouble swallowing.  You feel as though you are choking when you swallow.  You cough up blood or vomit blood.  You have trouble breathing. This information is not intended to replace advice given to you by your health care provider. Make sure you discuss any questions you have with your health care provider. Document Released: 03/02/2005  Document Revised: 08/25/2015 Document Reviewed: 03/10/2014 Elsevier Interactive Patient Education  [employer].

## 2016-11-08 ENCOUNTER — Encounter (HOSPITAL_BASED_OUTPATIENT_CLINIC_OR_DEPARTMENT_OTHER): Payer: Self-pay

## 2016-11-08 ENCOUNTER — Ambulatory Visit (HOSPITAL_BASED_OUTPATIENT_CLINIC_OR_DEPARTMENT_OTHER)
Admission: RE | Admit: 2016-11-08 | Discharge: 2016-11-08 | Disposition: A | Discharge status: Home / Self Care | Payer: Medicare Other | Source: Ambulatory Visit | Attending: Internal Medicine | Admitting: Internal Medicine

## 2016-11-08 DIAGNOSIS — K760 Fatty (change of) liver, not elsewhere classified: Secondary | ICD-10-CM | POA: Diagnosis not present

## 2016-11-08 DIAGNOSIS — I2510 Atherosclerotic heart disease of native coronary artery without angina pectoris: Secondary | ICD-10-CM | POA: Diagnosis not present

## 2016-11-08 DIAGNOSIS — I700 Atherosclerosis of aorta: Secondary | ICD-10-CM | POA: Diagnosis not present

## 2016-11-08 DIAGNOSIS — K449 Diaphragmatic hernia without obstruction or gangrene: Secondary | ICD-10-CM | POA: Diagnosis not present

## 2016-11-08 DIAGNOSIS — J439 Emphysema, unspecified: Secondary | ICD-10-CM | POA: Diagnosis not present

## 2016-11-08 DIAGNOSIS — R079 Chest pain, unspecified: Secondary | ICD-10-CM | POA: Insufficient documentation

## 2016-11-08 DIAGNOSIS — R490 Dysphonia: Secondary | ICD-10-CM | POA: Insufficient documentation

## 2016-11-08 DIAGNOSIS — I2584 Coronary atherosclerosis due to calcified coronary lesion: Secondary | ICD-10-CM | POA: Insufficient documentation

## 2016-11-08 DIAGNOSIS — K8020 Calculus of gallbladder without cholecystitis without obstruction: Secondary | ICD-10-CM | POA: Diagnosis not present

## 2016-11-08 MED ORDER — IOPAMIDOL (ISOVUE-300) INJECTION 61%
100.0000 mL | Freq: Once | INTRAVENOUS | Status: AC | PRN
  Administered 2016-11-08: 80 mL via INTRAVENOUS

## 2016-11-09 NOTE — Addendum Note (Signed)
Addended by: Damita Dunnings D on: 11/09/2016 10:16 AM   Modules accepted: Orders

## 2016-11-13 ENCOUNTER — Ambulatory Visit: Payer: Medicare Other | Admitting: Gastroenterology

## 2016-11-16 ENCOUNTER — Other Ambulatory Visit: Payer: Self-pay | Admitting: Cardiovascular Disease

## 2016-11-16 NOTE — Telephone Encounter (Signed)
Pt last saw Richardson Dopp, PA on 09/03/16 last labs 09/28/16 Creat 1.12, age 83, weight 93.4kg, based on specified criteria pt is on appropriate dosage of Eliquis 5mg  BID.  Will refill rx.

## 2016-12-02 ENCOUNTER — Other Ambulatory Visit: Payer: Self-pay | Admitting: Internal Medicine

## 2016-12-18 DIAGNOSIS — R253 Fasciculation: Secondary | ICD-10-CM | POA: Diagnosis not present

## 2016-12-18 DIAGNOSIS — R471 Dysarthria and anarthria: Secondary | ICD-10-CM | POA: Diagnosis not present

## 2016-12-18 DIAGNOSIS — R490 Dysphonia: Secondary | ICD-10-CM | POA: Diagnosis not present

## 2016-12-18 DIAGNOSIS — Z87891 Personal history of nicotine dependence: Secondary | ICD-10-CM | POA: Diagnosis not present

## 2016-12-18 DIAGNOSIS — R070 Pain in throat: Secondary | ICD-10-CM | POA: Diagnosis not present

## 2016-12-18 DIAGNOSIS — J385 Laryngeal spasm: Secondary | ICD-10-CM | POA: Diagnosis not present

## 2016-12-20 ENCOUNTER — Ambulatory Visit (INDEPENDENT_AMBULATORY_CARE_PROVIDER_SITE_OTHER): Payer: Medicare Other | Admitting: Cardiovascular Disease

## 2016-12-20 ENCOUNTER — Encounter: Payer: Self-pay | Admitting: Cardiovascular Disease

## 2016-12-20 VITALS — BP 110/68 | HR 86 | Ht 67.0 in | Wt 207.8 lb

## 2016-12-20 DIAGNOSIS — I745 Embolism and thrombosis of iliac artery: Secondary | ICD-10-CM | POA: Diagnosis not present

## 2016-12-20 DIAGNOSIS — I350 Nonrheumatic aortic (valve) stenosis: Secondary | ICD-10-CM | POA: Diagnosis not present

## 2016-12-20 NOTE — Progress Notes (Signed)
Cardiology Office Note Date:  12/20/2016   ID:  Fernando, Moyer January 20, 1934, MRN [id number]  PCP:  Colon Branch, MD  Cardiologist:  [name], MD    Chief Complaint  Patient presents with  . Follow-up    3 month  . Shortness of Breath     History of Present Illness: Fernando Moyer is a 83 y.o. male who presents for follow-up of aortic valve disease. The patient has undergone bioprosthetic aortic valve replacement in 2013. He's also followed for hypertension and paroxysmal atrial fibrillation and is maintained on chronic anticoagulation with eliquis. He has had chronic shortness of breath, but has been most limited by back and leg pain over the years.  Here's here alone today. Having problems with hoarseness and undergoing evaluation by ENT surgery and neurology, but nothing has been found yet. Chronic shortness of breath is unchanged and he denies progressive symptoms over the past year. No recent chest pain or pressure. No leg swelling or PND, mild chronic orthopnea.  Past Medical History:  Diagnosis Date  . AAA (abdominal aortic aneurysm) ([place]) 05/2008   PER CARDIAC CATH 3.5 X 3. 5 CM  . Aortic stenosis 05/2008   PER CARDIAC CATH, MILD, MITRAL STENOSIS  . Back pain    cause unknown but does get Cortisone injection every now and then  . Benign neoplasm of colon   . Bowen's disease    right arm BX: referred to dermatology  . Carotid artery occlusion    last u/s 3-11...Marland Kitchenper vasc.surgery 2007 cath: nonobstructive CAD. mil;d MS, trivial AoS, small AAA,  . Cataract    RIGHT removed  . Chronic back pain   . COPD (chronic obstructive pulmonary disease) ([place])   . Coronary artery disease 05/2008   MILD, MEDICAL MANAGEMENT  . Diabetes mellitus ([place]) 09/07/2011   no per pt- no medications  . Dyspnea    with exertion only  . GERD with stricture    w/hx of stricture  . History of nuclear stress test    Myoview 6/18: EF 51, normal perfusion; Low Risk  . Hyperlipidemia    takes Pravastatin daily  . Mitral stenosis 05/2008   PER CARDIAC CATH  . Myocardial infarction ([place]) 2012  . Peripheral neuropathy    right thigh; "it's been that way for years"  . Pulmonary hypertension ([place])   . PVD (peripheral vascular disease) ([place]) 12/07   per cath 12/07....iliac  . Stricture and stenosis of esophagus     Past Surgical History:  Procedure Laterality Date  . AORTIC VALVE REPLACEMENT  08/17/2011   Procedure: AORTIC VALVE REPLACEMENT (AVR);  Surgeon: Gaye Pollack, MD;  Location: [place];  Service: Open Heart Surgery;  Laterality: N/A;  . APPENDECTOMY    . CARDIAC CATHETERIZATION  02/2011   LAD 50, CFX OK, RCA40, EF 55%; PCWP 17, AoV  0.9 cm squared  . ENDARTERECTOMY  08-2010   Right carotid endarterectomy    . ESOPHAGEAL DILATION    . ESOPHAGOGASTRODUODENOSCOPY N/A 10/29/2015   Procedure: ESOPHAGOGASTRODUODENOSCOPY (EGD);  Surgeon: Jerene Bears, MD;  Location: [facility] ENDOSCOPY;  Service: Endoscopy;  Laterality: N/A;  . ESOPHAGOGASTRODUODENOSCOPY (EGD) WITH PROPOFOL N/A 11/10/2015   Procedure: ESOPHAGOGASTRODUODENOSCOPY (EGD) WITH PROPOFOL;  Surgeon: Milus Banister, MD;  Location: [facility] ENDOSCOPY;  Service: Endoscopy;  Laterality: N/A;  . ESOPHAGOGASTRODUODENOSCOPY (EGD) WITH PROPOFOL N/A 11/24/2015   Procedure: ESOPHAGOGASTRODUODENOSCOPY (EGD) WITH PROPOFOL;  Surgeon: Milus Banister, MD;  Location: [facility] ENDOSCOPY;  Service: Endoscopy;  Laterality:  N/A;  dil  . eye lid surgery Bilateral ~ 07-2015  . EYE SURGERY  10/14/13   cataract surgery LEFT eye   . LARYNGOSCOPY  08/29/2016   transnasal  . LEFT AND RIGHT HEART CATHETERIZATION WITH CORONARY ANGIOGRAM N/A 02/23/2011   Procedure: LEFT AND RIGHT HEART CATHETERIZATION WITH CORONARY ANGIOGRAM;  Surgeon: [name], MD;  Location: [facility] CATH LAB;  Service: Cardiovascular;  Laterality: N/A;  . STERNAL WIRES REMOVAL N/A 05/01/2013   Procedure: STERNAL WIRES REMOVAL;  Surgeon: Gaye Pollack, MD;  Location: [facility] OR;  Service: Thoracic;   Laterality: N/A;    Current Outpatient Prescriptions  Medication Sig Dispense Refill  . acetaminophen (TYLENOL) 500 MG tablet Take 1,000 mg by mouth every 6 (six) hours as needed (pain). Reported on 04/21/2015    . apixaban (ELIQUIS) 5 MG TABS tablet Take 5 mg by mouth 2 (two) times daily.    . ferrous sulfate (FERROUSUL) 325 (65 FE) MG tablet Take 1 tablet (325 mg total) by mouth 2 (two) times daily with a meal. 180 tablet 0  . furosemide (LASIX) 40 MG tablet TAKE 1 TABLET DAILY 90 tablet 2  . KLOR-CON M20 20 MEQ tablet TAKE 1 TABLET DAILY 90 tablet 2  . omeprazole (PRILOSEC) 40 MG capsule Take 1 capsule (40 mg total) by mouth 2 (two) times daily. 30 capsule 1  . pravastatin (PRAVACHOL) 40 MG tablet Take 1 tablet (40 mg total) by mouth daily. 90 tablet 1  . traMADol (ULTRAM) 50 MG tablet Take 1 tablet (50 mg total) by mouth every 12 (twelve) hours as needed. for pain 60 tablet 1   Current Facility-Administered Medications  Medication Dose Route Frequency Provider Last Rate Last Dose  . 0.9 %  sodium chloride infusion  500 mL Intravenous Continuous Nelida Meuse III, MD      . 0.9 %  sodium chloride infusion  500 mL Intravenous Continuous Milus Banister, MD        Allergies:   Patient has no known allergies.   Social History:  The patient  reports that he quit smoking about 29 years ago. His smoking use included Cigarettes. He has a 60.00 pack-year smoking history. He has never used smokeless tobacco. He reports that he does not drink alcohol or use drugs.   Family History:  The patient's  family history includes Heart attack (age of onset: 61) in his father; Prostate cancer (age of onset: 68) in his brother.    ROS:  Please see the history of present illness.  Otherwise, review of systems is positive for hearing loss, exertional dyspnea, back pain.  All other systems are reviewed and negative.    PHYSICAL EXAM: VS:  BP 110/68   Pulse 86   Ht 5\' 7"  (1.702 m)   Wt 94.3 kg (207 lb  12.8 oz)   BMI 32.55 kg/m  , BMI Body mass index is 32.55 kg/m. GEN: Well nourished, well developed, pleasant elderly male in no acute distress  HEENT: normal  Neck: no JVD, no masses. No carotid bruits Cardiac: RRR with 3/6 systolic murmur at the RUSB and apex               Respiratory:  clear to auscultation bilaterally, normal work of breathing GI: soft, nontender, nondistended, + BS MS: no deformity or atrophy  Ext: no pretibial edema Skin: warm and dry, no rash Neuro:  Strength and sensation are intact Psych: euthymic mood, full affect  EKG:  EKG is not ordered  today.  Recent Labs: 08/03/2016: TSH 4.39 09/28/2016: ALT 9; BUN 14; Creat 1.12; Potassium 4.0; Sodium 140 10/02/2016: Hemoglobin 15.0; Platelets 231   Lipid Panel     Component Value Date/Time   CHOL 126 08/25/2015 0757   TRIG 201.0 (H) 08/25/2015 0757   TRIG 96 03/20/2006 0945   HDL 24.00 (L) 08/25/2015 0757   CHOLHDL 5 08/25/2015 0757   VLDL 40.2 (H) 08/25/2015 0757   LDLCALC 78 06/17/2014 0933   LDLDIRECT 67.0 08/25/2015 0757      Wt Readings from Last 3 Encounters:  12/20/16 94.3 kg (207 lb 12.8 oz)  11/07/16 93.4 kg (206 lb)  11/02/16 94.3 kg (208 lb)    Cardiac Studies Reviewed: Myoview 09/07/2016: Study Highlights    Nuclear stress EF: 51%.  There was no ST segment deviation noted during stress.  The study is normal.  This is a low risk study.  The left ventricular ejection fraction is mildly decreased (45-54%).   Echo: Study Conclusions  - Procedure narrative: Transthoracic echocardiography. Image   quality was adequate. Intravenous contrast (Definity) was   administered. - Left ventricle: Narrow LVOT. The cavity size was normal. Systolic   function was vigorous. The estimated ejection fraction was in the   range of 65% to 70%. - Aortic valve: AV prosthesisis is difficult to see. Peak and mean   gradients through the valve are 42 and 24 mm HG respectively This   is increased from  report of 2015. - Mitral valve: MV is thckened. Difficult to see leaflets well MVA   by PT1/2 is 2.72 cm2 Calcified annulus. There was moderate   regurgitation. - Left atrium: The atrium was severely dilated. - Right ventricle: Systolic function was mildly reduced. - Pulmonary arteries: PA peak pressure: 50 mm Hg (S).    ASSESSMENT AND PLAN: 1.  CAD, native vessel: no angina. Chest pain symptoms have resolved and were largely atypical. Recent stress test is low-risk. Continue same Rx.   2. Aortic valve disease s/p bioprosthetic AVR. Most recent echo reviewed. Mildly elevated transaortic gradients and murmur more pronounced compared to past exams. Will arrange FU echo at time of his next office visit. Counseling done.   3. Paroxysmal atrial fibrillation: tolerating anticoagulation without bleeding complications.  4. Lower extremity PAD: appears to be asymptomatic. Less active over the past few years.  5. Chronic dyspnea, multifactorial, unchanged.   Current medicines are reviewed with the patient today.  The patient does not have concerns regarding medicines.  Labs/ tests ordered today include:  No orders of the defined types were placed in this encounter.   Disposition:   FU May 2019 with an echo prior to the office visit  Signed, [name], MD  12/20/2016 1:41 PM    [facility] [address], [place], [place]  [address] Phone: [phone]; Fax: [phone]

## 2016-12-20 NOTE — Patient Instructions (Signed)
Medication Instructions:  Your provider recommends that you continue on your current medications as directed. Please refer to the Current Medication list given to you today.    Labwork: None  Testing/Procedures: Your provider has requested that you have an echocardiogram in May, 2019. Echocardiography is a painless test that uses sound waves to create images of your heart. It provides your doctor with information about the size and shape of your heart and how well your heart's chambers and valves are working. This procedure takes approximately one hour. There are no restrictions for this procedure.  Follow-Up: Your provider wants you to follow-up in: May 2019 with Dr. Burt Knack. You will receive a reminder letter in the mail two months in advance. If you don't receive a letter, please call our office to schedule the follow-up appointment.    Any Other Special Instructions Will Be Listed Below (If Applicable).     If you need a refill on your cardiac medications before your next appointment, please call your pharmacy.

## 2016-12-24 DIAGNOSIS — Z8719 Personal history of other diseases of the digestive system: Secondary | ICD-10-CM | POA: Diagnosis not present

## 2016-12-24 DIAGNOSIS — R1310 Dysphagia, unspecified: Secondary | ICD-10-CM | POA: Diagnosis not present

## 2016-12-24 DIAGNOSIS — J449 Chronic obstructive pulmonary disease, unspecified: Secondary | ICD-10-CM | POA: Diagnosis not present

## 2016-12-24 DIAGNOSIS — R253 Fasciculation: Secondary | ICD-10-CM | POA: Diagnosis not present

## 2016-12-24 DIAGNOSIS — Z9889 Other specified postprocedural states: Secondary | ICD-10-CM | POA: Diagnosis not present

## 2016-12-24 DIAGNOSIS — R070 Pain in throat: Secondary | ICD-10-CM | POA: Diagnosis not present

## 2016-12-24 DIAGNOSIS — I6522 Occlusion and stenosis of left carotid artery: Secondary | ICD-10-CM | POA: Diagnosis not present

## 2016-12-24 DIAGNOSIS — R499 Unspecified voice and resonance disorder: Secondary | ICD-10-CM | POA: Diagnosis not present

## 2016-12-24 DIAGNOSIS — R471 Dysarthria and anarthria: Secondary | ICD-10-CM | POA: Diagnosis not present

## 2016-12-24 DIAGNOSIS — Z87891 Personal history of nicotine dependence: Secondary | ICD-10-CM | POA: Diagnosis not present

## 2016-12-27 DIAGNOSIS — K228 Other specified diseases of esophagus: Secondary | ICD-10-CM | POA: Diagnosis not present

## 2016-12-27 DIAGNOSIS — R471 Dysarthria and anarthria: Secondary | ICD-10-CM | POA: Diagnosis not present

## 2016-12-27 DIAGNOSIS — R490 Dysphonia: Secondary | ICD-10-CM | POA: Diagnosis not present

## 2016-12-27 DIAGNOSIS — J439 Emphysema, unspecified: Secondary | ICD-10-CM | POA: Diagnosis not present

## 2016-12-27 DIAGNOSIS — R070 Pain in throat: Secondary | ICD-10-CM | POA: Diagnosis not present

## 2017-01-03 ENCOUNTER — Other Ambulatory Visit: Payer: Self-pay

## 2017-01-03 MED ORDER — OMEPRAZOLE 40 MG PO CPDR: 40.0000 mg | DELAYED_RELEASE_CAPSULE | Freq: Two times a day (BID) | ORAL | 3 refills | Status: DC

## 2017-01-08 DIAGNOSIS — H524 Presbyopia: Secondary | ICD-10-CM | POA: Diagnosis not present

## 2017-01-08 DIAGNOSIS — H25811 Combined forms of age-related cataract, right eye: Secondary | ICD-10-CM | POA: Diagnosis not present

## 2017-01-08 DIAGNOSIS — H04123 Dry eye syndrome of bilateral lacrimal glands: Secondary | ICD-10-CM | POA: Diagnosis not present

## 2017-01-08 DIAGNOSIS — H26492 Other secondary cataract, left eye: Secondary | ICD-10-CM | POA: Diagnosis not present

## 2017-01-16 DIAGNOSIS — R490 Dysphonia: Secondary | ICD-10-CM | POA: Diagnosis not present

## 2017-01-16 DIAGNOSIS — I2510 Atherosclerotic heart disease of native coronary artery without angina pectoris: Secondary | ICD-10-CM | POA: Diagnosis not present

## 2017-01-16 DIAGNOSIS — Z87891 Personal history of nicotine dependence: Secondary | ICD-10-CM | POA: Diagnosis not present

## 2017-01-16 DIAGNOSIS — R1310 Dysphagia, unspecified: Secondary | ICD-10-CM | POA: Diagnosis not present

## 2017-01-16 DIAGNOSIS — R070 Pain in throat: Secondary | ICD-10-CM | POA: Diagnosis not present

## 2017-01-28 ENCOUNTER — Telehealth: Payer: Self-pay

## 2017-01-28 NOTE — Telephone Encounter (Signed)
Pt is requesting refill on Tramadol 50mg .   Last OV: 10/30/2016 Last Fill: 07/03/2016 #60 and 1RF UDS: 10/19/2014 Low risk  NCCR printed; no issues noted  Please advise.

## 2017-01-29 MED ORDER — TRAMADOL HCL 50 MG PO TABS: 50.0000 mg | ORAL_TABLET | Freq: Two times a day (BID) | ORAL | 1 refills | Status: DC | PRN

## 2017-01-29 NOTE — Telephone Encounter (Signed)
Rx printed, awaiting MD signature.  

## 2017-01-29 NOTE — Telephone Encounter (Signed)
Ok 60 and 1 RF 

## 2017-01-29 NOTE — Telephone Encounter (Signed)
Rx faxed to [facility] pharmacy.  

## 2017-02-04 DIAGNOSIS — I639 Cerebral infarction, unspecified: Secondary | ICD-10-CM | POA: Diagnosis not present

## 2017-02-04 DIAGNOSIS — R471 Dysarthria and anarthria: Secondary | ICD-10-CM | POA: Diagnosis not present

## 2017-02-05 DIAGNOSIS — L570 Actinic keratosis: Secondary | ICD-10-CM | POA: Diagnosis not present

## 2017-02-05 DIAGNOSIS — Z85828 Personal history of other malignant neoplasm of skin: Secondary | ICD-10-CM | POA: Diagnosis not present

## 2017-02-05 DIAGNOSIS — Z08 Encounter for follow-up examination after completed treatment for malignant neoplasm: Secondary | ICD-10-CM | POA: Diagnosis not present

## 2017-02-18 NOTE — Progress Notes (Addendum)
Subjective:   Fernando Moyer is a 83 y.o. male who presents for Medicare Annual (Subsequent) preventive examination.  Review of Systems:  No ROS.  Medicare Wellness Visit. Additional risk factors are reflected in the social history.  Cardiac Risk Factors include: advanced age (>44men, >58 women);diabetes mellitus;dyslipidemia;male gender Sleep patterns: Sleeps 9 hrs.  Male:   CCS- No longer doing routine screening due to age.      PSA-  Lab Results  Component Value Date   PSA 3.56 06/17/2014   PSA 3.68 01/17/2011   PSA 2.62 02/01/2006       Objective:     Vitals: BP 110/60 (BP Location: Left Arm, Patient Position: Sitting, Cuff Size: Normal)   Pulse 77   Ht 5\' 7"  (1.702 m)   Wt 211 lb 3.2 oz (95.8 kg)   SpO2 98%   BMI 33.08 kg/m   Body mass index is 33.08 kg/m.   Tobacco Social History   Tobacco Use  Smoking Status Former Smoker  . Packs/day: 2.00  . Years: 30.00  . Pack years: 60.00  . Types: Cigarettes  . Last attempt to quit: 04/03/1987  . Years since quitting: 29.9  Smokeless Tobacco Never Used  Tobacco Comment   quit smoking in 1989     Counseling given: Not Answered Comment: quit smoking in 1989   Past Medical History:  Diagnosis Date  . AAA (abdominal aortic aneurysm) ([place]) 05/2008   PER CARDIAC CATH 3.5 X 3. 5 CM  . Aortic stenosis 05/2008   PER CARDIAC CATH, MILD, MITRAL STENOSIS  . Back pain    cause unknown but does get Cortisone injection every now and then  . Benign neoplasm of colon   . Bowen's disease    right arm BX: referred to dermatology  . Carotid artery occlusion    last u/s 3-11...Marland Kitchenper vasc.surgery 2007 cath: nonobstructive CAD. mil;d MS, trivial AoS, small AAA,  . Cataract    RIGHT removed  . Chronic back pain   . COPD (chronic obstructive pulmonary disease) ([place])   . Coronary artery disease 05/2008   MILD, MEDICAL MANAGEMENT  . Diabetes mellitus ([place]) 09/07/2011   no per pt- no medications  . Dyspnea    with exertion  only  . GERD with stricture    w/hx of stricture  . History of nuclear stress test    Myoview 6/18: EF 51, normal perfusion; Low Risk  . Hyperlipidemia    takes Pravastatin daily  . Mitral stenosis 05/2008   PER CARDIAC CATH  . Myocardial infarction ([place]) 2012  . Peripheral neuropathy    right thigh; "it's been that way for years"  . Pulmonary hypertension ([place])   . PVD (peripheral vascular disease) ([place]) 12/07   per cath 12/07....iliac  . Stricture and stenosis of esophagus    Past Surgical History:  Procedure Laterality Date  . AORTIC VALVE REPLACEMENT  08/17/2011   Procedure: AORTIC VALVE REPLACEMENT (AVR);  Surgeon: Gaye Pollack, MD;  Location: [place];  Service: Open Heart Surgery;  Laterality: N/A;  . APPENDECTOMY    . CARDIAC CATHETERIZATION  02/2011   LAD 50, CFX OK, RCA40, EF 55%; PCWP 17, AoV  0.9 cm squared  . ENDARTERECTOMY  08-2010   Right carotid endarterectomy    . ESOPHAGEAL DILATION    . ESOPHAGOGASTRODUODENOSCOPY N/A 10/29/2015   Procedure: ESOPHAGOGASTRODUODENOSCOPY (EGD);  Surgeon: Jerene Bears, MD;  [facility];  Service: Endoscopy;  Laterality: N/A;  . ESOPHAGOGASTRODUODENOSCOPY (EGD) WITH PROPOFOL  N/A 11/10/2015   Procedure: ESOPHAGOGASTRODUODENOSCOPY (EGD) WITH PROPOFOL;  Surgeon: Milus Banister, MD;  [facility] ENDOSCOPY;  Service: Endoscopy;  Laterality: N/A;  . ESOPHAGOGASTRODUODENOSCOPY (EGD) WITH PROPOFOL N/A 11/24/2015   Procedure: ESOPHAGOGASTRODUODENOSCOPY (EGD) WITH PROPOFOL;  Surgeon: Milus Banister, MD;  Location[facility] ENDOSCOPY;  Service: Endoscopy;  Laterality: N/A;  dil  . eye lid surgery Bilateral ~ 07-2015  . EYE SURGERY  10/14/13   cataract surgery LEFT eye   . LARYNGOSCOPY  08/29/2016   transnasal  . LEFT AND RIGHT HEART CATHETERIZATION WITH CORONARY ANGIOGRAM N/A 02/23/2011   Procedure: LEFT AND RIGHT HEART CATHETERIZATION WITH CORONARY ANGIOGRAM;  Surgeon: [name], MD;  [facility] CATH LAB;  Service: Cardiovascular;   Laterality: N/A;  . STERNAL WIRES REMOVAL N/A 05/01/2013   Procedure: STERNAL WIRES REMOVAL;  Surgeon: Gaye Pollack, MD;  Location: [facility] OR;  Service: Thoracic;  Laterality: N/A;   Family History  Problem Relation Age of Onset  . Heart attack Father 69       MI  . Prostate cancer Brother 53  . Colon cancer Neg Hx   . Anesthesia problems Neg Hx   . Hypotension Neg Hx   . Malignant hyperthermia Neg Hx   . Pseudochol deficiency Neg Hx   . Stomach cancer Neg Hx   . Esophageal cancer Neg Hx   . Rectal cancer Neg Hx   . Pancreatic cancer Neg Hx    Social History   Substance and Sexual Activity  Sexual Activity Not on file    Outpatient Encounter Medications as of 02/26/2017  Medication Sig  . acetaminophen (TYLENOL) 500 MG tablet Take 1,000 mg by mouth every 6 (six) hours as needed (pain). Reported on 04/21/2015  . apixaban (ELIQUIS) 5 MG TABS tablet Take 5 mg by mouth 2 (two) times daily.  . ferrous sulfate (FERROUSUL) 325 (65 FE) MG tablet Take 1 tablet (325 mg total) by mouth 2 (two) times daily with a meal.  . furosemide (LASIX) 40 MG tablet TAKE 1 TABLET DAILY  . KLOR-CON M20 20 MEQ tablet TAKE 1 TABLET DAILY  . omeprazole (PRILOSEC) 40 MG capsule Take 1 capsule (40 mg total) by mouth 2 (two) times daily.  . pravastatin (PRAVACHOL) 40 MG tablet Take 1 tablet (40 mg total) by mouth daily.  . traMADol (ULTRAM) 50 MG tablet Take 1 tablet (50 mg total) by mouth every 12 (twelve) hours as needed. for pain (Patient not taking: Reported on 02/26/2017)   Facility-Administered Encounter Medications as of 02/26/2017  Medication  . 0.9 %  sodium chloride infusion  . 0.9 %  sodium chloride infusion    Activities of Daily Living In your present state of health, do you have any difficulty performing the following activities: 02/26/2017  Hearing? Y  Comment has hearing aids but doesn't wear them.  Vision? N  Comment Wearing glasses. Dr.Digby every 3 months.  Difficulty concentrating or  making decisions? N  Walking or climbing stairs? Y  Comment pt states he just cant  Dressing or bathing? N  Doing errands, shopping? N  Preparing Food and eating ? N  Using the Toilet? N  In the past six months, have you accidently leaked urine? N  Do you have problems with loss of bowel control? N  Managing your Medications? N  Managing your Finances? N  Housekeeping or managing your Housekeeping? N  Some recent data might be hidden    Patient Care Team: Colon Branch, MD as PCP -  General Angelia Mould, MD as Consulting Physician (Vascular Surgery) [name], MD as Consulting Physician (Cardiology) Murvin Donning, MD (Dermatology) Calvert Cantor, MD as Consulting Physician (Ophthalmology) Suella Broad, MD as Consulting Physician (Physical Medicine and Rehabilitation) Melissa Montane, MD as Consulting Physician (Otolaryngology)    Assessment:    Physical assessment deferred to PCP.  Exercise Activities and Dietary recommendations Current Exercise Habits: Home exercise routine, Time (Minutes): 15, Frequency (Times/Week): 4, Weekly Exercise (Minutes/Week): 60, Intensity: Mild Diet (meal preparation, eat out, water intake, caffeinated beverages, dairy products, fruits and vegetables): well balanced. Pt states his diet has to be soft due to swallowing difficulty. He enjoys bananas, peaches, sauteed spinach, and potatoes.   Goals    . Get diagnosis and treatment for throat issue (pt-stated)      Fall Risk Fall Risk  02/26/2017 12/27/2015 08/22/2015 04/21/2015 10/19/2014  Falls in the past year? No No No No No   Depression Screen PHQ 2/9 Scores 02/26/2017 12/27/2015 08/22/2015 04/21/2015  PHQ - 2 Score 0 0 0 0     Cognitive Function MMSE - Mini Mental State Exam 02/26/2017  Orientation to time 5  Orientation to Place 5  Registration 3  Attention/ Calculation 4  Recall 2  Language- name 2 objects 2  Language- repeat 1  Language- follow 3 step command 3  Language-  read & follow direction 1  Write a sentence 1  Copy design 1  Total score 28        Immunization History  Administered Date(s) Administered  . Influenza Split 01/17/2011  . Influenza Whole 05/06/2009  . Influenza, High Dose Seasonal PF 04/21/2015, 12/27/2015  . Influenza, Seasonal, Injecte, Preservative Fre 04/18/2012  . Influenza,inj,Quad PF,6+ Mos 01/20/2013, 02/01/2014  . Influenza-Unspecified 01/14/2017  . Pneumococcal Conjugate-13 06/17/2014  . Pneumococcal Polysaccharide-23 03/02/2006, 12/07/2011  . Td 04/02/1998  . Tdap 01/17/2011  . Zoster 09/30/2013   Screening Tests Health Maintenance  Topic Date Due  . URINE MICROALBUMIN  04/20/2016  . OPHTHALMOLOGY EXAM  08/04/2016  . FOOT EXAM  08/21/2016  . HEMOGLOBIN A1C  02/03/2017  . TETANUS/TDAP  01/16/2021  . INFLUENZA VACCINE  Completed  . PNA vac Low Risk Adult  Completed      Plan:   Please schedule appointment to follow up with Dr.Paz.  Continue to eat heart healthy diet (full of fruits, vegetables, whole grains, lean protein, water--limit salt, fat, and sugar intake) and increase physical activity as tolerated.  Continue doing brain stimulating activities (puzzles, reading, adult coloring books, staying active) to keep memory sharp.     I have personally reviewed and noted the following in the patient's chart:   . Medical and social history . Use of alcohol, tobacco or illicit drugs  . Current medications and supplements . Functional ability and status . Nutritional status . Physical activity . Advanced directives . List of other physicians . Hospitalizations, surgeries, and ER visits in previous 12 months . Vitals . Screenings to include cognitive, depression, and falls . Referrals and appointments  In addition, I have reviewed and discussed with patient certain preventive protocols, quality metrics, and best practice recommendations. A written personalized care plan for preventive services as well as  general preventive health recommendations were provided to patient.     Naaman Plummer [place], [place]  02/26/2017  [name], MD

## 2017-02-26 ENCOUNTER — Encounter: Payer: Self-pay | Admitting: *Deleted

## 2017-02-26 ENCOUNTER — Ambulatory Visit (INDEPENDENT_AMBULATORY_CARE_PROVIDER_SITE_OTHER): Payer: Medicare Other | Admitting: *Deleted

## 2017-02-26 VITALS — BP 110/60 | HR 77 | Ht 67.0 in | Wt 211.2 lb

## 2017-02-26 DIAGNOSIS — Z0000 Encounter for general adult medical examination without abnormal findings: Secondary | ICD-10-CM

## 2017-02-26 NOTE — Patient Instructions (Addendum)
Please schedule appointment to follow up with Dr.Paz.  Continue to eat heart healthy diet (full of fruits, vegetables, whole grains, lean protein, water--limit salt, fat, and sugar intake) and increase physical activity as tolerated.  Continue doing brain stimulating activities (puzzles, reading, adult coloring books, staying active) to keep memory sharp.    Fernando Moyer , Thank you for taking time to come for your Medicare Wellness Visit. I appreciate your ongoing commitment to your health goals. Please review the following plan we discussed and let me know if I can assist you in the future.   These are the goals we discussed: Goals    . Get diagnosis and treatment for throat issue (pt-stated)       This is a list of the screening recommended for you and due dates:  Health Maintenance  Topic Date Due  . Urine Protein Check  04/20/2016  . Eye exam for diabetics  08/04/2016  . Complete foot exam   08/21/2016  . Hemoglobin A1C  02/03/2017  . Tetanus Vaccine  01/16/2021  . Flu Shot  Completed  . Pneumonia vaccines  Completed    Health Maintenance, Male A healthy lifestyle and preventive care is important for your health and wellness. Ask your health care provider about what schedule of regular examinations is right for you. What should I know about weight and diet? Eat a Healthy Diet  Eat plenty of vegetables, fruits, whole grains, low-fat dairy products, and lean protein.  Do not eat a lot of foods high in solid fats, added sugars, or salt.  Maintain a Healthy Weight Regular exercise can help you achieve or maintain a healthy weight. You should:  Do at least 150 minutes of exercise each week. The exercise should increase your heart rate and make you sweat (moderate-intensity exercise).  Do strength-training exercises at least twice a week.  Watch Your Levels of Cholesterol and Blood Lipids  Have your blood tested for lipids and cholesterol every 5 years starting at 81 years  of age. If you are at high risk for heart disease, you should start having your blood tested when you are 81 years old. You may need to have your cholesterol levels checked more often if: ? Your lipid or cholesterol levels are high. ? You are older than 81 years of age. ? You are at high risk for heart disease.  What should I know about cancer screening? Many types of cancers can be detected early and may often be prevented. Lung Cancer  You should be screened every year for lung cancer if: ? You are a current smoker who has smoked for at least 30 years. ? You are a former smoker who has quit within the past 15 years.  Talk to your health care provider about your screening options, when you should start screening, and how often you should be screened.  Colorectal Cancer  Routine colorectal cancer screening usually begins at 81 years of age and should be repeated every 5-10 years until you are 81 years old. You may need to be screened more often if early forms of precancerous polyps or small growths are found. Your health care provider may recommend screening at an earlier age if you have risk factors for colon cancer.  Your health care provider may recommend using home test kits to check for hidden blood in the stool.  A small camera at the end of a tube can be used to examine your colon (sigmoidoscopy or colonoscopy). This checks for the  earliest forms of colorectal cancer.  Prostate and Testicular Cancer  Depending on your age and overall health, your health care provider may do certain tests to screen for prostate and testicular cancer.  Talk to your health care provider about any symptoms or concerns you have about testicular or prostate cancer.  Skin Cancer  Check your skin from head to toe regularly.  Tell your health care provider about any new moles or changes in moles, especially if: ? There is a change in a mole's size, shape, or color. ? You have a mole that is larger  than a pencil eraser.  Always use sunscreen. Apply sunscreen liberally and repeat throughout the day.  Protect yourself by wearing long sleeves, pants, a wide-brimmed hat, and sunglasses when outside.  What should I know about heart disease, diabetes, and high blood pressure?  If you are 29-64 years of age, have your blood pressure checked every 3-5 years. If you are 34 years of age or older, have your blood pressure checked every year. You should have your blood pressure measured twice-once when you are at a hospital or clinic, and once when you are not at a hospital or clinic. Record the average of the two measurements. To check your blood pressure when you are not at a hospital or clinic, you can use: ? An automated blood pressure machine at a pharmacy. ? A home blood pressure monitor.  Talk to your health care provider about your target blood pressure.  If you are between 64-17 years old, ask your health care provider if you should take aspirin to prevent heart disease.  Have regular diabetes screenings by checking your fasting blood sugar level. ? If you are at a normal weight and have a low risk for diabetes, have this test once every three years after the age of 65. ? If you are overweight and have a high risk for diabetes, consider being tested at a younger age or more often.  A one-time screening for abdominal aortic aneurysm (AAA) by ultrasound is recommended for men aged 54-75 years who are current or former smokers. What should I know about preventing infection? Hepatitis B If you have a higher risk for hepatitis B, you should be screened for this virus. Talk with your health care provider to find out if you are at risk for hepatitis B infection. Hepatitis C Blood testing is recommended for:  Everyone born from [birth year] through 1963.  Anyone with known risk factors for hepatitis C.  Sexually Transmitted Diseases (STDs)  You should be screened each year for STDs including  gonorrhea and chlamydia if: ? You are sexually active and are younger than 81 years of age. ? You are older than 81 years of age and your health care provider tells you that you are at risk for this type of infection. ? Your sexual activity has changed since you were last screened and you are at an increased risk for chlamydia or gonorrhea. Ask your health care provider if you are at risk.  Talk with your health care provider about whether you are at high risk of being infected with HIV. Your health care provider may recommend a prescription medicine to help prevent HIV infection.  What else can I do?  Schedule regular health, dental, and eye exams.  Stay current with your vaccines (immunizations).  Do not use any tobacco products, such as cigarettes, chewing tobacco, and e-cigarettes. If you need help quitting, ask your health care provider.  Limit alcohol  intake to no more than 2 drinks per day. One drink equals 12 ounces of beer, 5 ounces of wine, or 1 ounces of hard liquor.  Do not use street drugs.  Do not share needles.  Ask your health care provider for help if you need support or information about quitting drugs.  Tell your health care provider if you often feel depressed.  Tell your health care provider if you have ever been abused or do not feel safe at home. This information is not intended to replace advice given to you by your health care provider. Make sure you discuss any questions you have with your health care provider. Document Released: 09/15/2007 Document Revised: 11/16/2015 Document Reviewed: 12/21/2014 Elsevier Interactive Patient Education  [employer].

## 2017-03-15 ENCOUNTER — Ambulatory Visit (INDEPENDENT_AMBULATORY_CARE_PROVIDER_SITE_OTHER): Payer: Medicare Other | Admitting: Internal Medicine

## 2017-03-15 ENCOUNTER — Encounter: Payer: Self-pay | Admitting: Internal Medicine

## 2017-03-15 ENCOUNTER — Telehealth: Payer: Self-pay | Admitting: Internal Medicine

## 2017-03-15 VITALS — BP 132/68 | HR 94 | Temp 97.9°F | Resp 14 | Ht 67.0 in | Wt 211.5 lb

## 2017-03-15 DIAGNOSIS — I739 Peripheral vascular disease, unspecified: Secondary | ICD-10-CM

## 2017-03-15 DIAGNOSIS — R1310 Dysphagia, unspecified: Secondary | ICD-10-CM | POA: Diagnosis not present

## 2017-03-15 DIAGNOSIS — I779 Disorder of arteries and arterioles, unspecified: Secondary | ICD-10-CM

## 2017-03-15 DIAGNOSIS — I745 Embolism and thrombosis of iliac artery: Secondary | ICD-10-CM

## 2017-03-15 DIAGNOSIS — R490 Dysphonia: Secondary | ICD-10-CM

## 2017-03-15 DIAGNOSIS — Z09 Encounter for follow-up examination after completed treatment for conditions other than malignant neoplasm: Secondary | ICD-10-CM | POA: Diagnosis not present

## 2017-03-15 DIAGNOSIS — E785 Hyperlipidemia, unspecified: Secondary | ICD-10-CM

## 2017-03-15 DIAGNOSIS — I25810 Atherosclerosis of coronary artery bypass graft(s) without angina pectoris: Secondary | ICD-10-CM | POA: Diagnosis not present

## 2017-03-15 LAB — LIPID PANEL
CHOL/HDL RATIO: 4
Cholesterol: 120 mg/dL (ref 0–200)
HDL: 31.4 mg/dL — AB (ref >39.00)
NONHDL: 88.54
Triglycerides: 287 mg/dL — ABNORMAL HIGH (ref 0.0–149.0)
VLDL: 57.4 mg/dL — AB (ref 0.0–40.0)

## 2017-03-15 LAB — BASIC METABOLIC PANEL
BUN: 10 mg/dL (ref 6–23)
CHLORIDE: 103 meq/L (ref 96–112)
CO2: 32 meq/L (ref 19–32)
Calcium: 9.4 mg/dL (ref 8.4–10.5)
Creatinine, Ser: 1.09 mg/dL (ref 0.40–1.50)
GFR: 68.89 mL/min (ref >60.00)
Glucose, Bld: 118 mg/dL — ABNORMAL HIGH (ref 70–99)
POTASSIUM: 4.5 meq/L (ref 3.5–5.1)
SODIUM: 141 meq/L (ref 135–145)

## 2017-03-15 LAB — LDL CHOLESTEROL, DIRECT: LDL DIRECT: 78 mg/dL

## 2017-03-15 NOTE — Telephone Encounter (Signed)
Okay not to charge the no-show

## 2017-03-15 NOTE — Patient Instructions (Signed)
GO TO THE LAB : Get the blood work     GO TO THE FRONT DESK Schedule your next appointment for a checkup in 4-6 months

## 2017-03-15 NOTE — Telephone Encounter (Signed)
Copied from [place]. Topic: Quick Communication - See Telephone Encounter >> Mar 15, 2017  2:17 PM Rosalin Hawking wrote: CRM for notification. See Telephone encounter for:  03/15/17.   Pt came in office stating that received a bill for 10-12-2016 for a no show fee $50.00,  pt states did not know at all that he had an appt. If possible to not charge the no show fee. Pt mentioned that he had recently seen the provider on 08-2016 and was not needing a sooner appt after that. Please advise.

## 2017-03-15 NOTE — Telephone Encounter (Signed)
Please advise 

## 2017-03-15 NOTE — Progress Notes (Signed)
Pre visit review using our clinic review tool, if applicable. No additional management support is needed unless otherwise documented below in the visit note. 

## 2017-03-15 NOTE — Progress Notes (Addendum)
Subjective:    Patient ID: Fernando Moyer, male    DOB: [birth year], 83 y.o.   MRN: [id number]  DOS:  03/15/2017 Type of visit - description : rov Interval history: Several seen happening since the last time he was here. He saw cardiology, ENT and vascular surgery.  Had a EGD, saw speech therapy. Extensive chart review.  Review of Systems Overall doing the same. Still has dysphagia, lack of energy, occasional chest pain. Good compliance with medications. Still living by himself.   Past Medical History:  Diagnosis Date  . AAA (abdominal aortic aneurysm) ([place]) 05/2008   PER CARDIAC CATH 3.5 X 3. 5 CM  . Aortic stenosis 05/2008   PER CARDIAC CATH, MILD, MITRAL STENOSIS  . Back pain    cause unknown but does get Cortisone injection every now and then  . Benign neoplasm of colon   . Bowen's disease    right arm BX: referred to dermatology  . Carotid artery occlusion    last u/s 3-11...Marland Kitchenper vasc.surgery 2007 cath: nonobstructive CAD. mil;d MS, trivial AoS, small AAA,  . Cataract    RIGHT removed  . Chronic back pain   . COPD (chronic obstructive pulmonary disease) ([place])   . Coronary artery disease 05/2008   MILD, MEDICAL MANAGEMENT  . Diabetes mellitus ([place]) 09/07/2011   no per pt- no medications  . Dyspnea    with exertion only  . GERD with stricture    w/hx of stricture  . History of nuclear stress test    Myoview 6/18: EF 51, normal perfusion; Low Risk  . Hyperlipidemia    takes Pravastatin daily  . Mitral stenosis 05/2008   PER CARDIAC CATH  . Myocardial infarction ([place]) 2012  . Peripheral neuropathy    right thigh; "it's been that way for years"  . Pulmonary hypertension ([place])   . PVD (peripheral vascular disease) ([place]) 12/07   per cath 12/07....iliac  . Stricture and stenosis of esophagus     Past Surgical History:  Procedure Laterality Date  . AORTIC VALVE REPLACEMENT  08/17/2011   Procedure: AORTIC VALVE REPLACEMENT (AVR);  Surgeon: Gaye Pollack, MD;   Location: [facility];  Service: Open Heart Surgery;  Laterality: N/A;  . APPENDECTOMY    . CARDIAC CATHETERIZATION  02/2011   LAD 50, CFX OK, RCA40, EF 55%; PCWP 17, AoV  0.9 cm squared  . ENDARTERECTOMY  08-2010   Right carotid endarterectomy    . ESOPHAGEAL DILATION    . ESOPHAGOGASTRODUODENOSCOPY N/A 10/29/2015   Procedure: ESOPHAGOGASTRODUODENOSCOPY (EGD);  Surgeon: Jerene Bears, MD;  [facility] ENDOSCOPY;  Service: Endoscopy;  Laterality: N/A;  . ESOPHAGOGASTRODUODENOSCOPY (EGD) WITH PROPOFOL N/A 11/10/2015   Procedure: ESOPHAGOGASTRODUODENOSCOPY (EGD) WITH PROPOFOL;  Surgeon: Milus Banister, MD;  Location: [facility] ENDOSCOPY;  Service: Endoscopy;  Laterality: N/A;  . ESOPHAGOGASTRODUODENOSCOPY (EGD) WITH PROPOFOL N/A 11/24/2015   Procedure: ESOPHAGOGASTRODUODENOSCOPY (EGD) WITH PROPOFOL;  Surgeon: Milus Banister, MD;  Location: [facility] ENDOSCOPY;  Service: Endoscopy;  Laterality: N/A;  dil  . eye lid surgery Bilateral ~ 07-2015  . EYE SURGERY  10/14/13   cataract surgery LEFT eye   . LARYNGOSCOPY  08/29/2016   transnasal  . LEFT AND RIGHT HEART CATHETERIZATION WITH CORONARY ANGIOGRAM N/A 02/23/2011   Procedure: LEFT AND RIGHT HEART CATHETERIZATION WITH CORONARY ANGIOGRAM;  Surgeon: [name], MD;  Location: [facility] CATH LAB;  Service: Cardiovascular;  Laterality: N/A;  . STERNAL WIRES REMOVAL N/A 05/01/2013   Procedure: STERNAL WIRES REMOVAL;  Surgeon: Gaspar Bidding  Alveria Apley, MD;  Location: [facility] OR;  Service: Thoracic;  Laterality: N/A;    Social History   Socioeconomic History  . Marital status: Widowed    Spouse name: Not on file  . Number of children: 2  . Years of education: Not on file  . Highest education level: Not on file  Social Needs  . Financial resource strain: Not on file  . Food insecurity - worry: Not on file  . Food insecurity - inability: Not on file  . Transportation needs - medical: Not on file  . Transportation needs - non-medical: Not on file  Occupational History  . 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 Smoking status: Former Smoker    Packs/day: 2.00    Years: 30.00    Pack years: 60.00    Types: Cigarettes    Last attempt to quit: 04/03/1987    Years since quitting: 29.9  . Smokeless tobacco: Never Used  . Tobacco comment: quit smoking in 1989  Substance and Sexual Activity  . Alcohol use: No  . Drug use: No  . Sexual activity: Not on file  Other Topics Concern  . Not on file  Social History Narrative   LIVES BY HIMSELF    HAS 2 GROWN SONS, 5 G-KIDS    Lenna Sciara is her granddaughter, she is a Art therapist for the school system, usually comes with him for his appointments             Allergies as of 03/15/2017   No Known Allergies     Medication List        Accurate as of 03/15/17 11:59 PM. Always use your most recent med list.          acetaminophen 500 MG tablet Commonly known as:  TYLENOL Take 1,000 mg by mouth every 6 (six) hours as needed (pain). Reported on 04/21/2015   apixaban 5 MG Tabs tablet Commonly known as:  ELIQUIS Take 5 mg by mouth 2 (two) times daily.   ferrous sulfate 325 (65 FE) MG tablet Commonly known as:  FERROUSUL Take 1 tablet (325 mg total) by mouth 2 (two) times daily with a meal.   furosemide 40 MG tablet Commonly known as:  LASIX TAKE 1 TABLET DAILY   KLOR-CON M20 20 MEQ tablet Generic drug:  potassium chloride SA TAKE 1 TABLET DAILY   omeprazole 40 MG capsule Commonly known as:  PRILOSEC Take 1 capsule (40 mg total) by mouth 2 (two) times daily.   pravastatin 40 MG tablet Commonly known as:  PRAVACHOL Take 1 tablet (40 mg total) by mouth daily.   traMADol 50 MG tablet Commonly known as:  ULTRAM Take 1 tablet (50 mg total) by mouth every 12 (twelve) hours as needed. for pain          Objective:   Physical Exam BP 132/68 (BP Location: Left Arm, Patient Position: Sitting, Cuff Size: Small)    Pulse 94   Temp 97.9 F (36.6 C) (Oral)   Resp 14   Ht 5\' 7"  (1.702 m)   Wt 211 lb 8 oz (95.9 kg)   SpO2 96%   BMI 33.13 kg/m   General:   Well developed, well nourished . NAD.  HEENT:  Normocephalic . Face symmetric, atraumatic.  + Hoarseness noted. Lungs:  CTA B Normal respiratory effort, no intercostal retractions, no accessory muscle use.  Heart: RRR, + systolic murmur.  Mild  pretibial edema bilaterally  Skin: Not pale. Not jaundice Neurologic:  alert & oriented X3.  Speech normal, gait appropriate for age and unassisted Psych--  Cognition and judgment appear intact.  Cooperative with normal attention span and concentration.  Behavior appropriate. No anxious or depressed appearing.       Assessment & Plan:   Assessment DM  w/ neuropathy (feet burning, nl exam) COPD Hyperlipidemia GERD with stricture, EGD x 3 ~ July 2017 . EGD again 11/02/16:  Hiatal hernia.  Peptic appearing GE junction stricture, dilated to 5mm today with TTS balloon. Mild pan-gastritis CV: --CAD,  Last myoview 2014 --Carotid artery disease-- R CEA 2012 --A. Fib-- paroxysmal - eliquis  --Aortic valve replacement (bio) --PVD: AAA, iliAortic valve, subclavian artery stenosis  --EV:OJJKK open heart surgery 2013. CT chest 2014 no PE, gallbladder stones. --accurate BP would be in the right arm due to history of PVD.  MSK-- back pain, sees Dr Nelva Bush, tramadol per PCP Skin cancer Hoarseness: CT chest 2018 (-), saw ENT 2018, negative flexible laryngoscopy, Rx voice therapy.  Also saw neurology   PLAN  CAD, aortic disease, paroxysmal A. fib: Saw cardiology, they noted chest pain to be atypical,  murmur more significant and they are planning to do a echo before next visit.  Dyspnea felt to be multifactorial.  Check a BMP and FLP High cholesterol: On Pravachol, checking labs. PVD: Vascular surgery note reviewed, they noted that a accurate BP would be in the right arm due to history of PVD.    Hoarseness,  esophageal stricture, dysphagia: See last visit note, CT chest showed no aneurysm or nothing to account for his sx,  then saw Neurology and ENT. Neurology saw him for above and also concerns of tongue fasciculations and possibly ALS.  CT of the head and neck 12/27/2016 showed among other findings thickening of the proximal esophagus, they recommended possible EGD (which he had  11/02/16:  Hiatal hernia.  Peptic appearing GE junction stricture, dilated to 59mm today with TTS balloon. Mild pan-gastritis  ) ENT did a flexible laryngoscopy, it was essentially negative, they felt that he had significant muscle tension dysphonia, recommend voice therapy  . In summary he is doing about the same, will send GI a message to see if something else can be done regards his dysphagia and esophageal stricture. RTC 4-6 months.  Today, I spent more than 50   min with the patient: >50% of the time counseling regards his multiple medical concerns, doing extensive chart review including EGD reports, ENT, GI and neurology visits. Also, all of the above was not only discussed with the patient but - at his request-  with Lenna Sciara, his daughter through telephone call made in front of the patient.

## 2017-03-16 NOTE — Assessment & Plan Note (Addendum)
CAD, aortic disease, paroxysmal A. fib: Saw cardiology, they noted chest pain to be atypical,  murmur more significant and they are planning to do a echo before next visit.  Dyspnea felt to be multifactorial.  Check a BMP and FLP High cholesterol: On Pravachol, checking labs. PVD: Vascular surgery note reviewed, they noted that a accurate BP would be in the right arm due to history of PVD.    Hoarseness, esophageal stricture, dysphagia: See last visit note, CT chest showed no aneurysm or nothing to account for his sx,  then saw Neurology and ENT. Neurology saw him for above and also concerns of tongue fasciculations and possibly ALS.  CT of the head and neck 12/27/2016 showed among other findings thickening of the proximal esophagus, they recommended possible EGD (which he had  11/02/16:  Hiatal hernia.  Peptic appearing GE junction stricture, dilated to 28mm today with TTS balloon. Mild pan-gastritis  ) ENT did a flexible laryngoscopy, it was essentially negative, they felt that he had significant muscle tension dysphonia, recommend voice therapy   In summary he is doing about the same, will send GI a message to see if something else can be done regards his dysphagia and esophageal stricture. RTC 4-6 months.

## 2017-03-18 ENCOUNTER — Telehealth: Payer: Self-pay

## 2017-03-18 NOTE — Telephone Encounter (Signed)
-----   Message from Milus Banister, MD sent at 03/18/2017  8:07 AM EST ----- Regarding: RE: dysphagia  Jacqulyn Bath, Thanks  EGD 10/2016 I dilated the stricture to 5mm. Still have some room to dilate more (we can usually dilated safely up to 95mm) and so we will get in touch with him and offer repeat EGD with dilation.      Daysy Santini, Let him know that I heard from Dr. Larose Kells that he is still having swallowing difficulty, offer repeat EGD [facility] with balloon dilation.  Thanks   DJ ----- Message ----- From: Colon Branch, MD Sent: 03/16/2017   2:12 PM To: Milus Banister, MD Subject: dysphagia                                      Jebadiah, Imperato continue to have dysphagia, he had a EGD and dilatation few months ago.  Just like to keep you informed in case you think he may need further intervention. Thank you [facility]

## 2017-03-19 NOTE — Telephone Encounter (Signed)
The pt has been scheduled for previsit and EGD.

## 2017-03-19 NOTE — Telephone Encounter (Signed)
Left message on machine to call back  

## 2017-03-20 NOTE — Telephone Encounter (Signed)
Emailed charge correction to have fee removed per provider

## 2017-03-28 ENCOUNTER — Telehealth: Payer: Self-pay | Admitting: *Deleted

## 2017-03-28 NOTE — Telephone Encounter (Signed)
This is ok. Thanks

## 2017-03-28 NOTE — Telephone Encounter (Signed)
Dr. Ardis Hughs,  In August, this patient had an EGD and had to hold his Eliquis for 48 hours.  He has been scheduled again for another EGD with Dilation for 1/8.  Should this patient hold for 48 hours for this procedure or should he see you in the office first?  Please Advise.  Thank you,    B. Dearl Rudden, CMA  [facility]

## 2017-03-28 NOTE — Telephone Encounter (Signed)
See note from Dr Burt Knack

## 2017-03-28 NOTE — Telephone Encounter (Signed)
Dr Burt Knack is it ok for the pt to hold Eliquis again for EGD  procedure on 04/09/17?  Notes   [name], MD at 10/18/2016 8:51 AM   Status: Signed    May hold 48 hours thanks     Please advise

## 2017-03-28 NOTE — Telephone Encounter (Signed)
He will need to hold the eliquis for 2 days prior.    Patty, can you check with cardiology to make sure this is OK with them again.   Thanks

## 2017-03-29 NOTE — Telephone Encounter (Signed)
Noted.  Thanks.  B.Jamilynn Whitacre, CMA

## 2017-04-04 ENCOUNTER — Ambulatory Visit (AMBULATORY_SURGERY_CENTER): Payer: Self-pay

## 2017-04-04 ENCOUNTER — Other Ambulatory Visit: Payer: Self-pay

## 2017-04-04 VITALS — Ht 67.0 in | Wt 207.0 lb

## 2017-04-04 DIAGNOSIS — R1310 Dysphagia, unspecified: Secondary | ICD-10-CM

## 2017-04-04 NOTE — Progress Notes (Signed)
Denies allergies to eggs or soy products. Denies complication of anesthesia or sedation. Denies use of weight loss medication. Denies use of O2.   Emmi instructions declined.  

## 2017-04-09 ENCOUNTER — Ambulatory Visit (AMBULATORY_SURGERY_CENTER): Payer: Medicare Other | Admitting: Gastroenterology

## 2017-04-09 ENCOUNTER — Encounter: Payer: Self-pay | Admitting: Gastroenterology

## 2017-04-09 ENCOUNTER — Telehealth: Payer: Self-pay | Admitting: Cardiovascular Disease

## 2017-04-09 ENCOUNTER — Other Ambulatory Visit: Payer: Self-pay

## 2017-04-09 VITALS — BP 122/69 | HR 79 | Temp 97.1°F | Resp 15 | Ht 67.0 in | Wt 211.0 lb

## 2017-04-09 DIAGNOSIS — K222 Esophageal obstruction: Secondary | ICD-10-CM | POA: Diagnosis not present

## 2017-04-09 DIAGNOSIS — K219 Gastro-esophageal reflux disease without esophagitis: Secondary | ICD-10-CM | POA: Diagnosis not present

## 2017-04-09 DIAGNOSIS — R1310 Dysphagia, unspecified: Secondary | ICD-10-CM

## 2017-04-09 DIAGNOSIS — J449 Chronic obstructive pulmonary disease, unspecified: Secondary | ICD-10-CM | POA: Diagnosis not present

## 2017-04-09 DIAGNOSIS — I272 Pulmonary hypertension, unspecified: Secondary | ICD-10-CM | POA: Diagnosis not present

## 2017-04-09 DIAGNOSIS — I2510 Atherosclerotic heart disease of native coronary artery without angina pectoris: Secondary | ICD-10-CM | POA: Diagnosis not present

## 2017-04-09 DIAGNOSIS — I350 Nonrheumatic aortic (valve) stenosis: Secondary | ICD-10-CM

## 2017-04-09 DIAGNOSIS — K449 Diaphragmatic hernia without obstruction or gangrene: Secondary | ICD-10-CM

## 2017-04-09 DIAGNOSIS — I10 Essential (primary) hypertension: Secondary | ICD-10-CM | POA: Diagnosis not present

## 2017-04-09 MED ORDER — SODIUM CHLORIDE 0.9 % IV SOLN: 500.0000 mL | Freq: Once | INTRAVENOUS | Status: DC

## 2017-04-09 NOTE — Op Note (Signed)
[place] Patient Name: Fernando Moyer Procedure Date: 04/09/2017 7:57 AM MRN: [id number] Endoscopist: Milus Banister , MD Age: 84 Referring MD:  Date of Birth: [birth year] Gender: Male Account #: [id number] Procedure:                Upper GI endoscopy Indications:              Dysphagia; peptic stricture dilated 10/2016 with                            moderate results clinically; also dysarthria Medicines:                Monitored Anesthesia Care Procedure:                Pre-Anesthesia Assessment:                           - Prior to the procedure, a History and Physical                            was performed, and patient medications and                            allergies were reviewed. The patient's tolerance of                            previous anesthesia was also reviewed. The risks                            and benefits of the procedure and the sedation                            options and risks were discussed with the patient.                            All questions were answered, and informed consent                            was obtained. Prior Anticoagulants: The patient has                            taken Eliquis (apixaban), last dose was 3 days                            prior to procedure. ASA Grade Assessment: III - A                            patient with severe systemic disease. After                            reviewing the risks and benefits, the patient was                            deemed in satisfactory condition to undergo the  procedure.                           After obtaining informed consent, the endoscope was                            passed under direct vision. Throughout the                            procedure, the patient's blood pressure, pulse, and                            oxygen saturations were monitored continuously. The                            Endoscope was introduced through the mouth, and                            advanced to the second part of duodenum. The upper                            GI endoscopy was accomplished without difficulty.                            The patient tolerated the procedure well. Scope In: Scope Out: Findings:                 One moderate benign-appearing (peptic appearing)                            intrinsic stenosis was found at the                            gastroesophageal junction. A TTS dilator was passed                            through the scope. Dilation with an 18-19-20 mm                            balloon dilator was performed to 20 mm.                           A medium-sized hiatal hernia was present.                           The examination was otherwise normal. Complications:            No immediate complications. Estimated blood loss:                            None. Estimated Blood Loss:     Estimated blood loss: none. Impression:               - Benign, peptic-appearing esophageal stenosis.                            Dilated  to 43mm. This stricture is NOT causing his                            voice troubles (hoarseness, dysarthria).                           - Medium-sized hiatal hernia.                           - No specimens collected. Recommendation:           - Patient has a contact number available for                            emergencies. The signs and symptoms of potential                            delayed complications were discussed with the                            patient. Return to normal activities tomorrow.                            Written discharge instructions were provided to the                            patient.                           - Resume previous diet.                           - Continue present medications. OK to restart your                            blood thinner tomorrow.                           - Please follow up with neurology for your voice,                             dysarthria issues.                           - Chew your food well, eat slowly and take small                            bites. Milus Banister, MD 04/09/2017 8:14:20 AM This report has been signed electronically.

## 2017-04-09 NOTE — Progress Notes (Signed)
Report to PACU, RN, vss, BBS= Clear.  

## 2017-04-09 NOTE — Progress Notes (Signed)
Called to room to assist during endoscopic procedure.  Patient ID and intended procedure confirmed with present staff. Received instructions for my participation in the procedure from the performing physician.  

## 2017-04-09 NOTE — Progress Notes (Signed)
Pt's states no medical or surgical changes since previsit or office visit. 

## 2017-04-09 NOTE — Telephone Encounter (Signed)
Patient granddaughter Fernando Moyer) calling, states that she received message to call back and schedule appt with Dr. Burt Knack for May 2019. The last office visit notes state that patient needs echo, patient needs order for echo.

## 2017-04-09 NOTE — Patient Instructions (Addendum)
YOU HAD AN ENDOSCOPIC PROCEDURE TODAY AT [facility]:   Refer to the procedure report that was given to you for any specific questions about what was found during the examination.  If the procedure report does not answer your questions, please call your gastroenterologist to clarify.  If you requested that your care partner not be given the details of your procedure findings, then the procedure report has been included in a sealed envelope for you to review at your convenience later.  YOU SHOULD EXPECT: Some feelings of bloating in the abdomen. Passage of more gas than usual.  Walking can help get rid of the air that was put into your GI tract during the procedure and reduce the bloating. If you had a lower endoscopy (such as a colonoscopy or flexible sigmoidoscopy) you may notice spotting of blood in your stool or on the toilet paper. If you underwent a bowel prep for your procedure, you may not have a normal bowel movement for a few days.  Please Note:  You might notice some irritation and congestion in your nose or some drainage.  This is from the oxygen used during your procedure.  There is no need for concern and it should clear up in a day or so.  SYMPTOMS TO REPORT IMMEDIATELY:   Following upper endoscopy (EGD)  Vomiting of blood or coffee ground material  New chest pain or pain under the shoulder blades  Painful or persistently difficult swallowing  New shortness of breath  Fever of 100F or higher  Black, tarry-looking stools  For urgent or emergent issues, a gastroenterologist can be reached at any hour by calling [phone].   DIET:  We do recommend a small meal at first, but then you may proceed to your regular diet.  Drink plenty of fluids but you should avoid alcoholic beverages for 24 hours.  ACTIVITY:  You should plan to take it easy for the rest of today and you should NOT DRIVE or use heavy machinery until tomorrow (because of the sedation medicines used  during the test).    FOLLOW UP: Our staff will call the number listed on your records the next business day following your procedure to check on you and address any questions or concerns that you may have regarding the information given to you following your procedure. If we do not reach you, we will leave a message.  However, if you are feeling well and you are not experiencing any problems, there is no need to return our call.  We will assume that you have returned to your regular daily activities without incident.  If any biopsies were taken you will be contacted by phone or by letter within the next 1-3 weeks.  Please call us at [phone] if you have not heard about the biopsies in 3 weeks.   Esophagitis and Stricture (handout given) Post Esophageal Dilation Diet (handout given) Okay to start Blood thinner tomorrow Chew your food well, eat slowly and take small bites Please follow up with neurology for your voice, dysarthria issues Hiatal Hernia  SIGNATURES/CONFIDENTIALITY: You and/or your care partner have signed paperwork which will be entered into your electronic medical record.  These signatures attest to the fact that that the information above on your After Visit Summary has been reviewed and is understood.  Full responsibility of the confidentiality of this discharge information lies with you and/or your care-partner.

## 2017-04-10 ENCOUNTER — Telehealth: Payer: Self-pay | Admitting: *Deleted

## 2017-04-10 ENCOUNTER — Telehealth: Payer: Self-pay

## 2017-04-10 NOTE — Telephone Encounter (Signed)
  Follow up Call-  Call back number 04/09/2017 11/02/2016 07/31/2016 03/22/2015  Post procedure Call Back phone  # [phone] cell [phone]  Permission to leave phone message No Yes No Yes  comments no answering machine - - -  Some recent data might be hidden     Patient questions:  Do you have a fever, pain , or abdominal swelling? No. Pain Score  0 *  Have you tolerated food without any problems? Yes.    Have you been able to return to your normal activities? Yes.    Do you have any questions about your discharge instructions: Diet   No. Medications  No. Follow up visit  No.  Do you have questions or concerns about your Care? No.  Actions: * If pain score is 4 or above: No action needed, pain <4.

## 2017-04-10 NOTE — Telephone Encounter (Signed)
Follow up phone call, no answer and patients voicemail has not been set up.

## 2017-04-16 DIAGNOSIS — H02002 Unspecified entropion of right lower eyelid: Secondary | ICD-10-CM | POA: Diagnosis not present

## 2017-04-16 DIAGNOSIS — H25811 Combined forms of age-related cataract, right eye: Secondary | ICD-10-CM | POA: Diagnosis not present

## 2017-04-16 DIAGNOSIS — H04123 Dry eye syndrome of bilateral lacrimal glands: Secondary | ICD-10-CM | POA: Diagnosis not present

## 2017-04-16 DIAGNOSIS — H524 Presbyopia: Secondary | ICD-10-CM | POA: Diagnosis not present

## 2017-04-16 DIAGNOSIS — H02001 Unspecified entropion of right upper eyelid: Secondary | ICD-10-CM | POA: Diagnosis not present

## 2017-05-22 ENCOUNTER — Encounter (HOSPITAL_COMMUNITY): Payer: Medicare Other

## 2017-05-22 ENCOUNTER — Ambulatory Visit: Payer: Medicare Other | Admitting: Family

## 2017-05-23 ENCOUNTER — Ambulatory Visit (INDEPENDENT_AMBULATORY_CARE_PROVIDER_SITE_OTHER)
Admission: RE | Admit: 2017-05-23 | Discharge: 2017-05-23 | Disposition: A | Discharge status: Home / Self Care | Payer: Medicare Other | Source: Ambulatory Visit | Attending: Family | Admitting: Family

## 2017-05-23 ENCOUNTER — Ambulatory Visit (INDEPENDENT_AMBULATORY_CARE_PROVIDER_SITE_OTHER): Payer: Medicare Other | Admitting: Family

## 2017-05-23 ENCOUNTER — Ambulatory Visit (HOSPITAL_COMMUNITY)
Admission: RE | Admit: 2017-05-23 | Discharge: 2017-05-23 | Disposition: A | Discharge status: Home / Self Care | Payer: Medicare Other | Source: Ambulatory Visit | Attending: Family | Admitting: Family

## 2017-05-23 ENCOUNTER — Encounter: Payer: Self-pay | Admitting: Family

## 2017-05-23 VITALS — BP 114/77 | HR 82 | Resp 20 | Ht 67.0 in | Wt 201.0 lb

## 2017-05-23 DIAGNOSIS — I70213 Atherosclerosis of native arteries of extremities with intermittent claudication, bilateral legs: Secondary | ICD-10-CM

## 2017-05-23 DIAGNOSIS — I745 Embolism and thrombosis of iliac artery: Secondary | ICD-10-CM | POA: Diagnosis not present

## 2017-05-23 DIAGNOSIS — I714 Abdominal aortic aneurysm, without rupture, unspecified: Secondary | ICD-10-CM

## 2017-05-23 DIAGNOSIS — I6523 Occlusion and stenosis of bilateral carotid arteries: Secondary | ICD-10-CM

## 2017-05-23 DIAGNOSIS — I771 Stricture of artery: Secondary | ICD-10-CM | POA: Diagnosis not present

## 2017-05-23 DIAGNOSIS — Z9889 Other specified postprocedural states: Secondary | ICD-10-CM

## 2017-05-23 DIAGNOSIS — Z87891 Personal history of nicotine dependence: Secondary | ICD-10-CM

## 2017-05-23 LAB — VAS US CAROTID
LCCAPSYS: 97 cm/s
LICADDIAS: -31 cm/s
Left CCA dist dias: 13 cm/s
Left CCA dist sys: 72 cm/s
Left CCA prox dias: 13 cm/s
Left ICA dist sys: -259 cm/s
Left ICA prox dias: -22 cm/s
Left ICA prox sys: -115 cm/s
RCCADSYS: -90 cm/s
RCCAPDIAS: 10 cm/s
RIGHT CCA MID DIAS: 17 cm/s
RIGHT VERTEBRAL DIAS: 9 cm/s
Right CCA prox sys: 87 cm/s

## 2017-05-23 NOTE — Progress Notes (Signed)
[facility] [place] HISTORY AND PHYSICAL   CC: Follow up extracranial carotid artery stenosis, small AAA, and peripheral artery occlusive disease    History of Present Illness:   Fernando Moyer is a 84 y.o. male who is s/p right carotid endarterectomy in May 2012 by Dr. Scot Dock. We've been following a moderate left carotid stenosis.  He returned on 11-07-16 at the request of Dr. Jimmye Norman, his chiropractor, and his grand-daughter, for 4 month hx of dysphagia, trouble eating, food hangs in his chest. He had an endoscopy on 11-24-15: Benign-appearing esophageal stenosis dilated, medium sized hiatal hernia, exam was otherwise normal.   He had a myocardial perfusion scan in June 2018 which was normal.   Dr. Larose Kells has been trying to determine the etiology of pt's hoarseness, dysphagia, and slurred speech.  His speech is more slurred than it was at his last visit with me; he states has has speech therapy starting June 06, 2017.   Pt has been revaluated by ENT, and GI.   Review of imaging indicates that CT of chest was done on 11-08-16: IMPRESSION: 1. Atherosclerosis of the thoracic aorta without evidence of aortic aneurysm. There remains evidence of a probable significant stenosis involving the proximal left subclavian artery. Correlation suggested with any clinical evidence of subclavian steal syndrome. 2. Coronary atherosclerosis with calcified plaque in the distribution of the LAD and RCA. 3. Large hiatal hernia. 4. Stable emphysema without acute findings or evidence of pulmonary nodules. 5. Cholelithiasis with numerous calcified gallstones. 6. Hepatic steatosis.    Pt also c/o worsening dyspnea for about 2 years. Hoarseness is also worsening.  He denies any lateralizing symptoms, denies sudden changes in vision; he does have hoarseness and slurred speech.    Walking worsens his dyspnea, he also has claudication in both calves after walking  about 200 feet, relieved by rest.  Before his worsening dyspnea, he was having bilateral calf pain after walking about 300 feet, resolved with rest. He is able to lie flat, he states, is initially more dyspneic, then dyspnea subsides, he uses one pillow in bed.   He has a small AAA that his cardiologist hadbeen following (review of records, see report below). He had his aortic valve replaced in 2013, (bovine sourced, per pt). Dr. Burt Knack notes on 12/13/14 that pt has paroxysmal atrial fib.  He has known lumbar spine issues with radiculopathy sx's in both legs; states he has tried ESI's with little success per pt; states he is not a surgery candidate for this, sees Dr. Nelva Bush for this. He was riding his stationary bike, 15 minutes daily.   Pt Diabetic: yesA1C was 5.8 on 08-03-16 (reveiw of records) Pt smoker: quit in 1989, started about age 60  Pt meds include: Statin : yes ASA: no Other anticoagulants/antiplatelets: Eliquis, paroxysmal atrial fib    Current Outpatient Medications  Medication Sig Dispense Refill  . acetaminophen (TYLENOL) 500 MG tablet Take 1,000 mg by mouth every 6 (six) hours as needed (pain). Reported on 04/21/2015    . apixaban (ELIQUIS) 5 MG TABS tablet Take 5 mg by mouth 2 (two) times daily.    . ferrous sulfate (FERROUSUL) 325 (65 FE) MG tablet Take 1 tablet (325 mg total) by mouth 2 (two) times daily with a meal. 180 tablet 0  . furosemide (LASIX) 40 MG tablet TAKE 1 TABLET DAILY 90 tablet 2  . KLOR-CON M20 20 MEQ tablet TAKE 1 TABLET DAILY 90 tablet 2  . omeprazole (PRILOSEC) 40 MG  capsule Take 1 capsule (40 mg total) by mouth 2 (two) times daily. 180 capsule 3  . pravastatin (PRAVACHOL) 40 MG tablet Take 1 tablet (40 mg total) by mouth daily. 90 tablet 1  . traMADol (ULTRAM) 50 MG tablet Take 1 tablet (50 mg total) by mouth every 12 (twelve) hours as needed. for pain 60 tablet 1   No current facility-administered medications for this visit.     Past Medical  History:  Diagnosis Date  . AAA (abdominal aortic aneurysm) ([place]) 05/2008   PER CARDIAC CATH 3.5 X 3. 5 CM  . Aortic stenosis 05/2008   PER CARDIAC CATH, MILD, MITRAL STENOSIS  . Back pain    cause unknown but does get Cortisone injection every now and then  . Benign neoplasm of colon   . Bowen's disease    right arm BX: referred to dermatology  . Carotid artery occlusion    last u/s 3-11...[facility] Kitchenper vasc.surgery 2007 cath: nonobstructive CAD. mil;d MS, trivial AoS, small AAA,  . Cataract    RIGHT removed  . Chronic back pain   . COPD (chronic obstructive pulmonary disease) ([place])   . Coronary artery disease 05/2008   MILD, MEDICAL MANAGEMENT  . Diabetes mellitus ([place]) 09/07/2011   no per pt- no medications  . Dyspnea    with exertion only  . GERD with stricture    w/hx of stricture  . History of nuclear stress test    Myoview 6/18: EF 51, normal perfusion; Low Risk  . Hyperlipidemia    takes Pravastatin daily  . Mitral stenosis 05/2008   PER CARDIAC CATH  . Myocardial infarction ([place]) 2012  . Peripheral neuropathy    right thigh; "it's been that way for years"  . Pulmonary hypertension ([place])   . PVD (peripheral vascular disease) ([place]) 12/07   per cath 12/07....iliac  . Stricture and stenosis of esophagus     Social History Social History   Tobacco Use  . Smoking status: Former Smoker    Packs/day: 2.00    Years: 30.00    Pack years: 60.00    Types: Cigarettes    Last attempt to quit: 04/03/1987    Years since quitting: 30.1  . Smokeless tobacco: Never Used  . Tobacco comment: quit smoking in 1989  Substance Use Topics  . Alcohol use: No  . Drug use: No    Family History Family History  Problem Relation Age of Onset  . Heart attack Father 64       MI  . Prostate cancer Brother 57  . Colon cancer Neg Hx   . Anesthesia problems Neg Hx   . Hypotension Neg Hx   . Malignant hyperthermia Neg Hx   . Pseudochol deficiency Neg Hx   . Stomach cancer Neg Hx   . Esophageal  cancer Neg Hx   . Rectal cancer Neg Hx   . Pancreatic cancer Neg Hx     Surgical History Past Surgical History:  Procedure Laterality Date  . AORTIC VALVE REPLACEMENT  08/17/2011   Procedure: AORTIC VALVE REPLACEMENT (AVR);  Surgeon: Gaye Pollack, MD;  Location: [facility];  Service: Open Heart Surgery;  Laterality: N/A;  . APPENDECTOMY    . CARDIAC CATHETERIZATION  02/2011   LAD 50, CFX OK, RCA40, EF 55%; PCWP 17, AoV  0.9 cm squared  . ENDARTERECTOMY  08-2010   Right carotid endarterectomy    . ESOPHAGEAL DILATION    . ESOPHAGOGASTRODUODENOSCOPY N/A 10/29/2015   Procedure: ESOPHAGOGASTRODUODENOSCOPY (EGD);  Surgeon: Jerene Bears, MD;  Location: [facility];  Service: Endoscopy;  Laterality: N/A;  . ESOPHAGOGASTRODUODENOSCOPY (EGD) WITH PROPOFOL N/A 11/10/2015   Procedure: ESOPHAGOGASTRODUODENOSCOPY (EGD) WITH PROPOFOL;  Surgeon: Milus Banister, MD;  Location: [facility];  Service: Endoscopy;  Laterality: N/A;  . ESOPHAGOGASTRODUODENOSCOPY (EGD) WITH PROPOFOL N/A 11/24/2015   Procedure: ESOPHAGOGASTRODUODENOSCOPY (EGD) WITH PROPOFOL;  Surgeon: Milus Banister, MD;  Location: [facility];  Service: Endoscopy;  Laterality: N/A;  dil  . eye lid surgery Bilateral ~ 07-2015  . EYE SURGERY  10/14/13   cataract surgery LEFT eye   . LARYNGOSCOPY  08/29/2016   transnasal  . LEFT AND RIGHT HEART CATHETERIZATION WITH CORONARY ANGIOGRAM N/A 02/23/2011   Procedure: LEFT AND RIGHT HEART CATHETERIZATION WITH CORONARY ANGIOGRAM;  Surgeon: [name], MD;  Location: [facility];  Service: Cardiovascular;  Laterality: N/A;  . STERNAL WIRES REMOVAL N/A 05/01/2013   Procedure: STERNAL WIRES REMOVAL;  Surgeon: Gaye Pollack, MD;  Location: [facility] OR;  Service: Thoracic;  Laterality: N/A;    No Known Allergies  Current Outpatient Medications  Medication Sig Dispense Refill  . acetaminophen (TYLENOL) 500 MG tablet Take 1,000 mg by mouth every 6 (six) hours as needed (pain). Reported on 04/21/2015    .  apixaban (ELIQUIS) 5 MG TABS tablet Take 5 mg by mouth 2 (two) times daily.    . ferrous sulfate (FERROUSUL) 325 (65 FE) MG tablet Take 1 tablet (325 mg total) by mouth 2 (two) times daily with a meal. 180 tablet 0  . furosemide (LASIX) 40 MG tablet TAKE 1 TABLET DAILY 90 tablet 2  . KLOR-CON M20 20 MEQ tablet TAKE 1 TABLET DAILY 90 tablet 2  . omeprazole (PRILOSEC) 40 MG capsule Take 1 capsule (40 mg total) by mouth 2 (two) times daily. 180 capsule 3  . pravastatin (PRAVACHOL) 40 MG tablet Take 1 tablet (40 mg total) by mouth daily. 90 tablet 1  . traMADol (ULTRAM) 50 MG tablet Take 1 tablet (50 mg total) by mouth every 12 (twelve) hours as needed. for pain 60 tablet 1   No current facility-administered medications for this visit.      REVIEW OF SYSTEMS: See HPI for pertinent positives and negatives.  Physical Examination Vitals:   05/23/17 0923 05/23/17 0924  BP: (!) 158/80 114/77  Pulse: 82   Resp: 20   SpO2: 96%   Weight: 201 lb (91.2 kg)   Height: 5\' 7"  (1.702 m)    Body mass index is 31.48 kg/m.  General:  WDWN obese male in NAD GAIT:normal HENT: No gross abnormalities  Eyes: PERRLA Pulmonary: Respirations are slightly labored at rest, slightly more labored with putting on his socks and shoes, fair air movement in all fields, CTAB, no rales, rhonchi, or wheezing. Cardiac: Regular rhythm and rate,+ murmur.  VASCULAR EXAM Carotid Bruits Right Left   negative negative   Abdominal aortic pulse is not palpable. Radial pulses are 2+ right and 1+ left palpable   LE Pulses Right Left  FEMORAL not palpable (obese) faintly palpable   POPLITEAL not palpable  not palpable  POSTERIOR TIBIAL Not palpable  not palpable   DORSALIS PEDIS ANTERIOR TIBIAL not palpable  not palpable     Gastrointestinal: soft, nontender, BS WNL, no r/g, no palpable masses. Musculoskeletal: no muscle atrophy/wasting.  M/S 5/5 throughout, extremities without ischemic changes. Dermatologic: No rash, no cellulitis, no ulcers noted. Raised skin colored lesion on his left temple, and blister-like lesion on his lower lip (pt  states has seen a dermatologist re these lesions). Neurologic: A&O X 3; Appropriate Affect, speech is hoarse and slurred, CN 2-12 intact except is very hard of hearing, pain and light touch intact in extremities, Motor exam as listed above Psychiatric: Normal thought content, mood appropriate to clinical situation.      ASSESSMENT:  Fernando Moyer is a 84 y.o. male who is s/p right carotid endarterectomy in May 2012. We've been following a moderate left carotid stenosis.  He has no hx of stroke or TIA.  He has a complex medical history.   His left brachial pressure is >20 mm Hg lower than right brachial pressure, carotid duplex demonstrates no evidence today of left subclavian artery stenosis, but has on previous duplex. Accurate blood pressures will therefore be in the right arm.  He has bilateral calf claudication after walking about 200 feet (worse than his last visit with me), resolves with rest; he is using his stationary bike walking. He is trying to walk more.  According to 2015 aortoiliac duplex, he has known right common iliac artery occlusion and >50% stenosis of the right external and left common iliac arteries, and a small AAA. AAA remains small at 3.5 cm, based on limited visualization.   His atherosclerotic risk factors include currently controlled DM (not on any DM medications), mild CAD, former smoker, obesity, and paroxysmal atrial fib on Eliquis.    DATA  Carotid Duplex (05/23/17): Right ICA: (CEA site). No restenosis Left ICA: 40-59% stenosis Left ECA stenosis Right vertebral artery with antegrade flow, left with retrograde flow.  Bilateral subclavian artery waveforms are normal.  No significant change compared to the exams on 04-20-15 and 05-16-16.   AAA  Duplex (05/23/17): Largest diameter: 3.5 cm; Right CIA: 1.2 cm; Left CIA not visualized. Visualization of the distal abdominal aorta was also limited due to overlying bowel gas and obesity. Previous (10-01-16 at [facility]): 3.7 cm largest abdominal aorta diameter    ABI (Date: 05/23/2017):    R:   ABI: 0.65 (no previous),   PT: no waveform morphology documented  DP: no waveform morphology documented   TBI:  0.43  L:   ABI: 1.14 (no previous),   PT: no waveform morphology documented  DP: no waveform morphology documented   TBI: 0.61   12/04/13 AAA Duplex at [facility], requested by Dr. Burt Knack: Technically challenging study. Probable stable dimensions of infrarenal AAA, measuring 3.3 cm x 3.6 cm. Normal caliber common and external iliac arteries, bilaterally. Chronic right common iliac artery occlusion. >50% stenosis of the right external and left common iliac arteries. Patent left external iliac artery.    Plan: Follow-up in 1 year with Carotid Duplex scan, AAA duplex, and ABI's.  I discussed in depth with the patient the nature of atherosclerosis, and emphasized the importance of maximal medical management including strict control of blood pressure, blood glucose, and lipid levels, obtaining regular exercise, and cessation of smoking.  The patient is aware that without maximal medical management the underlying atherosclerotic disease process will progress, limiting the benefit of any interventions.  Consideration for repair of AAA would be made when the size approaches 5.0 cm, growth > 1 cm/yr, and symptomatic status. The patient was given information about AAA including signs, symptoms, treatment,  what symptoms should prompt the patient to seek immediate medical care, and how to minimize the risk of enlargement and rupture of aneurysms.   The patient was given information about stroke prevention and what symptoms should prompt the patient to seek  immediate medical care.  The  patient was given information about PAD including signs, symptoms, treatment, what symptoms should prompt the patient to seek immediate medical care, and risk reduction measures to take.  Thank you for allowing [place] to participate in this patient's care.  Clemon Chambers, RN, MSN, FNP-C [facility] Office: [phone]  Clinic MD: [facility] 05/23/2017 9:27 AM

## 2017-05-23 NOTE — Patient Instructions (Addendum)
Before your next abdominal ultrasound:  Take two Extra-Strength Gas-X capsules at bedtime the night before the test. Take another two Extra-Strength Gas-X capsules 3 hours before the test.  Avoid gas forming foods the day before the test.       Stroke Prevention Some health problems and behaviors may make it more likely for you to have a stroke. Below are ways to lessen your risk of having a stroke.  Be active for at least 30 minutes on most or all days.  Do not smoke. Try not to be around others who smoke.  Do not drink too much alcohol. ? Do not have more than 2 drinks a day if you are a man. ? Do not have more than 1 drink a day if you are a woman and are not pregnant.  Eat healthy foods, such as fruits and vegetables. If you were put on a specific diet, follow the diet as told.  Keep your cholesterol levels under control through diet and medicines. Look for foods that are low in saturated fat, trans fat, cholesterol, and are high in fiber.  If you have diabetes, follow all diet plans and take your medicine as told.  Ask your doctor if you need treatment to lower your blood pressure. If you have high blood pressure (hypertension), follow all diet plans and take your medicine as told by your doctor.  If you are 37-20 years old, have your blood pressure checked every 3-5 years. If you are age 71 or older, have your blood pressure checked every year.  Keep a healthy weight. Eat foods that are low in calories, salt, saturated fat, trans fat, and cholesterol.  Do not take drugs.  Avoid birth control pills, if this applies. Talk to your doctor about the risks of taking birth control pills.  Talk to your doctor if you have sleep problems (sleep apnea).  Take all medicine as told by your doctor. ? You may be told to take aspirin or blood thinner medicine. Take this medicine as told by your doctor. ? Understand your medicine instructions.  Make sure any other conditions you have  are being taken care of.  Get help right away if:  You suddenly lose feeling (you feel numb) or have weakness in your face, arm, or leg.  Your face or eyelid hangs down to one side.  You suddenly feel confused.  You have trouble talking (aphasia) or understanding what people are saying.  You suddenly have trouble seeing in one or both eyes.  You suddenly have trouble walking.  You are dizzy.  You lose your balance or your movements are clumsy (uncoordinated).  You suddenly have a very bad headache and you do not know the cause.  You have new chest pain.  Your heart feels like it is fluttering or skipping a beat (irregular heartbeat). Do not wait to see if the symptoms above go away. Get help right away. Call your local emergency services (911 in [place] Do not drive yourself to the hospital. This information is not intended to replace advice given to you by your health care provider. Make sure you discuss any questions you have with your health care provider. Document Released: 09/18/2011 Document Revised: 08/25/2015 Document Reviewed: 09/19/2012 Elsevier Interactive Patient Education  2018 [employer].      Intermittent Claudication Intermittent claudication is pain in your leg that occurs when you walk or exercise and goes away when you rest. The pain can occur in one or both legs.  What are the causes? Intermittent claudication is caused by the buildup of plaque within the major arteries in the body (atherosclerosis). The plaque, which makes arteries stiff and narrow, prevents enough blood from reaching your leg muscles. The pain occurs when you walk or exercise because your muscles need more blood when you are moving and exercising. What increases the risk? Risk factors include:  A family history of atherosclerosis.  A personal history of stroke or heart disease.  Older age.  Being inactive or overweight.  Smoking cigarettes.  Having another health condition  such as: ? Diabetes. ? High blood pressure. ? High cholesterol.  What are the signs or symptoms? Your hip or leg may:  Ache.  Cramp.  Feel tight.  Feel weak.  Feel heavy.  Over time, you may feel pain in your calf, thigh, or hip. How is this diagnosed? Your health care provider may diagnose intermittent claudication based on your symptoms and medical history. Your health care provider may also do tests to learn more about your condition. These may include:  Blood tests.  An ultrasound.  Imaging tests such as angiography, magnetic resonance angiography (MRA), and computed tomography angiography (CTA).  How is this treated? You may be treated for problems such as:  High blood pressure.  High cholesterol.  Diabetes.  Other treatments may include:  Lifestyle changes such as: ? Starting an exercise program. ? Losing weight. ? Quitting smoking.  Medicines to help restore blood flow through your legs.  Blood vessel surgery (angioplasty) to restore blood flow if your intermittent claudication is caused by severe peripheral artery disease.  Follow these instructions at home:  Manage any other health conditions you have.  Eat a diet low in saturated fats and calories to maintain a healthy weight.  Quit smoking, if you smoke.  Take medicines only as directed by your health care provider.  If your health care provider recommended an exercise program for you, follow it as directed. Your exercise program may involve: ? Walking three or more times a week. ? Walking until you have certain symptoms of intermittent claudication. ? Resting until symptoms go away. ? Gradually increasing walking time to about 50 minutes a day. Contact a health care provider if: Your condition is not getting better or is getting worse. Get help right away if:  You have chest pain.  You have difficulty breathing.  You develop arm weakness.  You have trouble speaking.  Your face  begins to droop. This information is not intended to replace advice given to you by your health care provider. Make sure you discuss any questions you have with your health care provider. Document Released: 01/20/2004 Document Revised: 08/25/2015 Document Reviewed: 06/25/2013 Elsevier Interactive Patient Education  2017 [employer].     Abdominal Aortic Aneurysm Blood pumps away from the heart through tubes (blood vessels) called arteries. Aneurysms are weak or damaged places in the wall of an artery. It bulges out like a balloon. An abdominal aortic aneurysm happens in the main artery of the body (aorta). It can burst or tear, causing bleeding inside the body. This is an emergency. It needs treatment right away. What are the causes? The exact cause is unknown. Things that could cause this problem include:  Fat and other substances building up in the lining of a tube.  Swelling of the walls of a blood vessel.  Certain tissue diseases.  Belly (abdominal) trauma.  An infection in the main artery of the body.  What increases the  risk? There are things that make it more likely for you to have an aneurysm. These include:  Being over the age of 81 years old.  Having high blood pressure (hypertension).  Being a male.  Being white.  Being very overweight (obese).  Having a family history of aneurysm.  Using tobacco products.  What are the signs or symptoms? Symptoms depend on the size of the aneurysm and how fast it grows. There may not be symptoms. If symptoms occur, they can include:  Pain (belly, side, lower back, or groin).  Feeling full after eating a small amount of food.  Feeling sick to your stomach (nauseous), throwing up (vomiting), or both.  Feeling a lump in your belly that feels like it is beating (pulsating).  Feeling like you will pass out (faint).  How is this treated?  Medicine to control blood pressure and pain.  Imaging tests to see if the  aneurysm gets bigger.  Surgery. How is this prevented? To lessen your chance of getting this condition:  Stop smoking. Stop chewing tobacco.  Limit or avoid alcohol.  Keep your blood pressure, blood sugar, and cholesterol within normal limits.  Eat less salt.  Eat foods low in saturated fats and cholesterol. These are found in animal and whole dairy products.  Eat more fiber. Fiber is found in whole grains, vegetables, and fruits.  Keep a healthy weight.  Stay active and exercise often.  This information is not intended to replace advice given to you by your health care provider. Make sure you discuss any questions you have with your health care provider. Document Released: 07/14/2012 Document Revised: 08/25/2015 Document Reviewed: 04/18/2012 Elsevier Interactive Patient Education  2017 [employer].

## 2017-05-29 ENCOUNTER — Encounter: Payer: Self-pay | Admitting: Family

## 2017-06-02 ENCOUNTER — Other Ambulatory Visit: Payer: Self-pay | Admitting: Internal Medicine

## 2017-06-06 DIAGNOSIS — I6523 Occlusion and stenosis of bilateral carotid arteries: Secondary | ICD-10-CM | POA: Diagnosis not present

## 2017-06-06 DIAGNOSIS — R490 Dysphonia: Secondary | ICD-10-CM | POA: Diagnosis not present

## 2017-06-06 DIAGNOSIS — F17200 Nicotine dependence, unspecified, uncomplicated: Secondary | ICD-10-CM | POA: Diagnosis not present

## 2017-06-06 DIAGNOSIS — R471 Dysarthria and anarthria: Secondary | ICD-10-CM | POA: Diagnosis not present

## 2017-06-07 ENCOUNTER — Other Ambulatory Visit: Payer: Self-pay | Admitting: Internal Medicine

## 2017-06-10 NOTE — Telephone Encounter (Signed)
Rx sent. Pt uses mail order- I have sent 90 day supply. MyChart message sent.

## 2017-06-10 NOTE — Telephone Encounter (Signed)
History of COPD, okay to restart. #1 and 1 RF. Advised patient via phone call or message: if COPD symptoms are not improving with spiriva:  Needs office visit

## 2017-06-10 NOTE — Telephone Encounter (Signed)
Received refill request for Spiriva- no longer on Pt med list- please advise.

## 2017-06-26 DIAGNOSIS — R471 Dysarthria and anarthria: Secondary | ICD-10-CM | POA: Diagnosis not present

## 2017-07-03 DIAGNOSIS — G5621 Lesion of ulnar nerve, right upper limb: Secondary | ICD-10-CM | POA: Diagnosis not present

## 2017-07-03 DIAGNOSIS — R0602 Shortness of breath: Secondary | ICD-10-CM | POA: Diagnosis not present

## 2017-07-03 DIAGNOSIS — R471 Dysarthria and anarthria: Secondary | ICD-10-CM | POA: Diagnosis not present

## 2017-07-17 DIAGNOSIS — G1221 Amyotrophic lateral sclerosis: Secondary | ICD-10-CM | POA: Insufficient documentation

## 2017-07-17 DIAGNOSIS — Z79899 Other long term (current) drug therapy: Secondary | ICD-10-CM | POA: Diagnosis not present

## 2017-07-17 DIAGNOSIS — Z87891 Personal history of nicotine dependence: Secondary | ICD-10-CM | POA: Diagnosis not present

## 2017-07-31 ENCOUNTER — Ambulatory Visit (HOSPITAL_COMMUNITY): Payer: Medicare Other | Attending: Cardiology

## 2017-07-31 ENCOUNTER — Other Ambulatory Visit: Payer: Self-pay

## 2017-07-31 DIAGNOSIS — J449 Chronic obstructive pulmonary disease, unspecified: Secondary | ICD-10-CM | POA: Insufficient documentation

## 2017-07-31 DIAGNOSIS — E785 Hyperlipidemia, unspecified: Secondary | ICD-10-CM | POA: Insufficient documentation

## 2017-07-31 DIAGNOSIS — E119 Type 2 diabetes mellitus without complications: Secondary | ICD-10-CM | POA: Insufficient documentation

## 2017-07-31 DIAGNOSIS — I272 Pulmonary hypertension, unspecified: Secondary | ICD-10-CM | POA: Insufficient documentation

## 2017-07-31 DIAGNOSIS — I10 Essential (primary) hypertension: Secondary | ICD-10-CM | POA: Insufficient documentation

## 2017-07-31 DIAGNOSIS — I252 Old myocardial infarction: Secondary | ICD-10-CM | POA: Diagnosis not present

## 2017-07-31 DIAGNOSIS — I350 Nonrheumatic aortic (valve) stenosis: Secondary | ICD-10-CM | POA: Diagnosis not present

## 2017-07-31 DIAGNOSIS — I4891 Unspecified atrial fibrillation: Secondary | ICD-10-CM | POA: Insufficient documentation

## 2017-08-01 DIAGNOSIS — R633 Feeding difficulties: Secondary | ICD-10-CM | POA: Diagnosis not present

## 2017-08-01 DIAGNOSIS — R1312 Dysphagia, oropharyngeal phase: Secondary | ICD-10-CM | POA: Diagnosis not present

## 2017-08-02 DIAGNOSIS — G1221 Amyotrophic lateral sclerosis: Secondary | ICD-10-CM | POA: Diagnosis not present

## 2017-08-02 DIAGNOSIS — R471 Dysarthria and anarthria: Secondary | ICD-10-CM | POA: Diagnosis not present

## 2017-08-02 DIAGNOSIS — G4736 Sleep related hypoventilation in conditions classified elsewhere: Secondary | ICD-10-CM | POA: Diagnosis not present

## 2017-08-02 DIAGNOSIS — J449 Chronic obstructive pulmonary disease, unspecified: Secondary | ICD-10-CM | POA: Diagnosis not present

## 2017-08-02 DIAGNOSIS — R1310 Dysphagia, unspecified: Secondary | ICD-10-CM | POA: Diagnosis not present

## 2017-08-02 DIAGNOSIS — Z515 Encounter for palliative care: Secondary | ICD-10-CM | POA: Diagnosis not present

## 2017-08-05 ENCOUNTER — Telehealth: Payer: Self-pay

## 2017-08-05 DIAGNOSIS — I350 Nonrheumatic aortic (valve) stenosis: Secondary | ICD-10-CM

## 2017-08-05 NOTE — Telephone Encounter (Signed)
Per DPR form, left detailed message with results on VM. Gave instruction to call with questions or concerns. Repeat echo ordered to be scheduled in 1 year.

## 2017-08-05 NOTE — Telephone Encounter (Signed)
-----   Message from [name], MD sent at 08/05/2017  6:33 AM EDT ----- Normal LV function. Moderate prosthetic aortic stenosis. Continue with serial FU - echo in one year.

## 2017-08-09 ENCOUNTER — Ambulatory Visit: Payer: Medicare Other | Admitting: Cardiovascular Disease

## 2017-08-12 ENCOUNTER — Other Ambulatory Visit: Payer: Self-pay

## 2017-08-12 ENCOUNTER — Ambulatory Visit: Payer: Medicare Other | Admitting: Cardiovascular Disease

## 2017-08-13 ENCOUNTER — Encounter: Payer: Self-pay | Admitting: Internal Medicine

## 2017-08-13 ENCOUNTER — Ambulatory Visit (INDEPENDENT_AMBULATORY_CARE_PROVIDER_SITE_OTHER): Payer: Medicare Other | Admitting: Internal Medicine

## 2017-08-13 ENCOUNTER — Other Ambulatory Visit: Payer: Self-pay | Admitting: Cardiovascular Disease

## 2017-08-13 VITALS — BP 136/66 | HR 83 | Temp 97.6°F | Resp 14 | Ht 67.0 in | Wt 198.0 lb

## 2017-08-13 DIAGNOSIS — E0800 Diabetes mellitus due to underlying condition with hyperosmolarity without nonketotic hyperglycemic-hyperosmolar coma (NKHHC): Secondary | ICD-10-CM | POA: Diagnosis not present

## 2017-08-13 DIAGNOSIS — R7989 Other specified abnormal findings of blood chemistry: Secondary | ICD-10-CM

## 2017-08-13 DIAGNOSIS — I745 Embolism and thrombosis of iliac artery: Secondary | ICD-10-CM

## 2017-08-13 DIAGNOSIS — Z794 Long term (current) use of insulin: Secondary | ICD-10-CM | POA: Diagnosis not present

## 2017-08-13 DIAGNOSIS — G1221 Amyotrophic lateral sclerosis: Secondary | ICD-10-CM | POA: Diagnosis not present

## 2017-08-13 LAB — CBC WITH DIFFERENTIAL/PLATELET
BASOS PCT: 0.8 % (ref 0.0–3.0)
Basophils Absolute: 0.1 10*3/uL (ref 0.0–0.1)
EOS PCT: 1.8 % (ref 0.0–5.0)
Eosinophils Absolute: 0.2 10*3/uL (ref 0.0–0.7)
HCT: 42.5 % (ref 39.0–52.0)
Hemoglobin: 14.5 g/dL (ref 13.0–17.0)
LYMPHS ABS: 1.9 10*3/uL (ref 0.7–4.0)
Lymphocytes Relative: 20.2 % (ref 12.0–46.0)
MCHC: 34.2 g/dL (ref 30.0–36.0)
MCV: 88.8 fl (ref 78.0–100.0)
MONO ABS: 0.8 10*3/uL (ref 0.1–1.0)
Monocytes Relative: 8.2 % (ref 3.0–12.0)
NEUTROS PCT: 69 % (ref 43.0–77.0)
Neutro Abs: 6.5 10*3/uL (ref 1.4–7.7)
Platelets: 248 10*3/uL (ref 150.0–400.0)
RBC: 4.79 Mil/uL (ref 4.22–5.81)
RDW: 14.5 % (ref 11.5–15.5)
WBC: 9.4 10*3/uL (ref 4.0–10.5)

## 2017-08-13 LAB — COMPREHENSIVE METABOLIC PANEL
ALT: 7 U/L (ref 0–53)
AST: 18 U/L (ref 0–37)
Albumin: 4.2 g/dL (ref 3.5–5.2)
Alkaline Phosphatase: 60 U/L (ref 39–117)
BUN: 10 mg/dL (ref 6–23)
CHLORIDE: 102 meq/L (ref 96–112)
CO2: 30 meq/L (ref 19–32)
Calcium: 9.6 mg/dL (ref 8.4–10.5)
Creatinine, Ser: 0.89 mg/dL (ref 0.40–1.50)
GFR: 86.95 mL/min (ref >60.00)
GLUCOSE: 130 mg/dL — AB (ref 70–99)
POTASSIUM: 4.3 meq/L (ref 3.5–5.1)
Sodium: 140 mEq/L (ref 135–145)
Total Bilirubin: 1.2 mg/dL (ref 0.2–1.2)
Total Protein: 6.6 g/dL (ref 6.0–8.3)

## 2017-08-13 LAB — TSH: TSH: 7.67 u[IU]/mL — ABNORMAL HIGH (ref 0.35–4.50)

## 2017-08-13 LAB — HEMOGLOBIN A1C: Hgb A1c MFr Bld: 6.3 % (ref 4.6–6.5)

## 2017-08-13 NOTE — Patient Instructions (Signed)
GO TO THE LAB : Get the blood work     GO TO THE FRONT DESK Schedule your next appointment for a   checkup in 6 months 

## 2017-08-13 NOTE — Progress Notes (Signed)
Pre visit review using our clinic review tool, if applicable. No additional management support is needed unless otherwise documented below in the visit note. 

## 2017-08-13 NOTE — Progress Notes (Signed)
Subjective:    Patient ID: Fernando Moyer, male    DOB: 1933-12-06, 84 y.o.   MRN: [id number]  DOS:  08/13/2017 Type of visit - description : f/u Interval history: Since the last office visit, he was diagnosed with ALS, chart reviewed. COPD: On Spiriva, symptoms relatively well controlled DM, diet controlled, checking labs   Review of Systems Denies fever, chills. Speech is gradually worse, now communicating mostly by writing in a tablet Swallowing is difficult. Emotionally doing okay. No chest pain no difficulty breathing  Past Medical History:  Diagnosis Date  . AAA (abdominal aortic aneurysm) ([place]) 05/2008   PER CARDIAC CATH 3.5 X 3. 5 CM  . Aortic stenosis 05/2008   PER CARDIAC CATH, MILD, MITRAL STENOSIS  . Back pain    cause unknown but does get Cortisone injection every now and then  . Benign neoplasm of colon   . Bowen's disease    right arm BX: referred to dermatology  . Carotid artery occlusion    last u/s 3-11...Marland Kitchenper vasc.surgery 2007 cath: nonobstructive CAD. mil;d MS, trivial AoS, small AAA,  . Cataract    RIGHT removed  . Chronic back pain   . COPD (chronic obstructive pulmonary disease) ([place])   . Coronary artery disease 05/2008   MILD, MEDICAL MANAGEMENT  . Diabetes mellitus ([place]) 09/07/2011   no per pt- no medications  . Dyspnea    with exertion only  . GERD with stricture    w/hx of stricture  . History of nuclear stress test    Myoview 6/18: EF 51, normal perfusion; Low Risk  . Hyperlipidemia    takes Pravastatin daily  . Mitral stenosis 05/2008   PER CARDIAC CATH  . Myocardial infarction ([place]) 2012  . Peripheral neuropathy    right thigh; "it's been that way for years"  . Pulmonary hypertension ([place])   . PVD (peripheral vascular disease) ([place]) 12/07   per cath 12/07....iliac  . Stricture and stenosis of esophagus     Past Surgical History:  Procedure Laterality Date  . AORTIC VALVE REPLACEMENT  08/17/2011   Procedure: AORTIC VALVE  REPLACEMENT (AVR);  Surgeon: Gaye Pollack, MD;  Location: [facility];  Service: Open Heart Surgery;  Laterality: N/A;  . APPENDECTOMY    . CARDIAC CATHETERIZATION  02/2011   LAD 50, CFX OK, RCA40, EF 55%; PCWP 17, AoV  0.9 cm squared  . ENDARTERECTOMY  08-2010   Right carotid endarterectomy    . ESOPHAGEAL DILATION    . ESOPHAGOGASTRODUODENOSCOPY N/A 10/29/2015   Procedure: ESOPHAGOGASTRODUODENOSCOPY (EGD);  Surgeon: Jerene Bears, MD;  Location: [facility] ENDOSCOPY;  Service: Endoscopy;  Laterality: N/A;  . ESOPHAGOGASTRODUODENOSCOPY (EGD) WITH PROPOFOL N/A 11/10/2015   Procedure: ESOPHAGOGASTRODUODENOSCOPY (EGD) WITH PROPOFOL;  Surgeon: Milus Banister, MD;  Location: [facility] ENDOSCOPY;  Service: Endoscopy;  Laterality: N/A;  . ESOPHAGOGASTRODUODENOSCOPY (EGD) WITH PROPOFOL N/A 11/24/2015   Procedure: ESOPHAGOGASTRODUODENOSCOPY (EGD) WITH PROPOFOL;  Surgeon: Milus Banister, MD;  Location: [facility] ENDOSCOPY;  Service: Endoscopy;  Laterality: N/A;  dil  . eye lid surgery Bilateral ~ 07-2015  . EYE SURGERY  10/14/13   cataract surgery LEFT eye   . LARYNGOSCOPY  08/29/2016   transnasal  . LEFT AND RIGHT HEART CATHETERIZATION WITH CORONARY ANGIOGRAM N/A 02/23/2011   Procedure: LEFT AND RIGHT HEART CATHETERIZATION WITH CORONARY ANGIOGRAM;  Surgeon: [name], MD;  Location: [facility] CATH LAB;  Service: Cardiovascular;  Laterality: N/A;  . STERNAL WIRES REMOVAL N/A 05/01/2013   Procedure: STERNAL WIRES REMOVAL;  Surgeon: Gaye Pollack, MD;  Location: [facility] OR;  Service: Thoracic;  Laterality: N/A;    Social History   Socioeconomic History  . Marital status: Widowed    Spouse name: Not on file  . Number of children: 2  . Years of education: Not on file  . Highest education level: Not on file  Occupational History  . 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 Financial resource strain: Not on file  . Food insecurity:    Worry:  Not on file    Inability: Not on file  . Transportation needs:    Medical: Not on file    Non-medical: Not on file  Tobacco Use  . Smoking status: Former Smoker    Packs/day: 2.00    Years: 30.00    Pack years: 60.00    Types: Cigarettes    Last attempt to quit: 04/03/1987    Years since quitting: 30.3  . Smokeless tobacco: Never Used  . Tobacco comment: quit smoking in 1989  Substance and Sexual Activity  . Alcohol use: No  . Drug use: No  . Sexual activity: Not on file  Lifestyle  . Physical activity:    Days per week: Not on file    Minutes per session: Not on file  . Stress: Not on file  Relationships  . Social connections:    Talks on phone: Not on file    Gets together: Not on file    Attends religious service: Not on file    Active member of club or organization: Not on file    Attends meetings of clubs or organizations: Not on file    Relationship status: Not on file  . Intimate partner violence:    Fear of current or ex partner: Not on file    Emotionally abused: Not on file    Physically abused: Not on file    Forced sexual activity: Not on file  Other Topics Concern  . Not on file  Social History Narrative   LIVES BY HIMSELF    HAS 2 GROWN SONS, 5 G-KIDS    Lenna Sciara is her granddaughter, she is a Art therapist for the school system, usually comes with him for his appointments             Allergies as of 08/13/2017   No Known Allergies     Medication List        Accurate as of 08/13/17 11:59 PM. Always use your most recent med list.          acetaminophen 500 MG tablet Commonly known as:  TYLENOL Take 1,000 mg by mouth every 6 (six) hours as needed (pain). Reported on 04/21/2015   ELIQUIS 5 MG Tabs tablet Generic drug:  apixaban TAKE 1 TABLET TWICE A DAY   ferrous sulfate 325 (65 FE) MG tablet Commonly known as:  FERROUSUL Take 1 tablet (325 mg total) by mouth 2 (two) times daily with a meal.   furosemide 40 MG tablet Commonly known as:   LASIX TAKE 1 TABLET DAILY   KLOR-CON M20 20 MEQ tablet Generic drug:  potassium chloride SA TAKE 1 TABLET DAILY   omeprazole 40 MG capsule Commonly known as:  PRILOSEC Take 1 capsule (40 mg total) by mouth 2 (two) times daily.   pravastatin 40 MG tablet Commonly known as:  PRAVACHOL Take 1 tablet (40 mg total)  by mouth daily.   tiotropium 18 MCG inhalation capsule Commonly known as:  SPIRIVA HANDIHALER Place 1 capsule (18 mcg total) into inhaler and inhale daily.   traMADol 50 MG tablet Commonly known as:  ULTRAM Take 1 tablet (50 mg total) by mouth every 12 (twelve) hours as needed. for pain   UNABLE TO FIND Check CMP monthly x 2 months. Fax results to dr caress at [place] [phone])   [place] referral for ALS and pain management.  Dx. ALS G12.21. Last MD eval: 07/17/2017.  [facility] to [facility], [place] [phone].          Objective:   Physical Exam BP 136/66 (BP Location: Left Arm, Patient Position: Sitting, Cuff Size: Small)   Pulse 83   Temp 97.6 F (36.4 C) (Oral)   Resp 14   Ht 5\' 7"  (1.702 m)   Wt 198 lb (89.8 kg)   SpO2 97%   BMI 31.01 kg/m  General:   Well developed, well nourished . NAD.  HEENT:  Normocephalic . Face symmetric, atraumatic Lungs:  + Rhonchi bilaterally. Normal respiratory effort, no intercostal retractions, no accessory muscle use. Heart: Regular?.  + Murmur.  No pretibial edema bilaterally  Skin: Not pale. Not jaundice Neurologic:  alert & oriented X3.  Speech quite limited, hoarseness noted. Psych--  Cognition and judgment appear intact.  Cooperative with normal attention span and concentration.  Behavior appropriate. No anxious or depressed appearing.      Assessment & Plan:    Assessment DM  w/ neuropathy (feet burning, nl exam) COPD Hyperlipidemia GERD with stricture, EGD x 3 ~ July 2017 . EGD again 11/02/16:  Hiatal hernia.  Peptic appearing GE junction  stricture, dilated to 43mm today with TTS balloon. Mild pan-gastritis ALS dx 07-2017 CV: --CAD,  Last myoview 2014 --Carotid artery disease-- R CEA 2012 --A. Fib-- paroxysmal - eliquis  --Aortic valve replacement (bio) --PVD: AAA, iliAortic valve, subclavian artery stenosis  --JO:ACZYS open heart surgery 2013. CT chest 2014 no PE, gallbladder stones. --accurate BP would be in the right arm due to history of PVD.  MSK-- back pain, sees Dr Nelva Bush, tramadol per PCP Skin cancer Hoarseness: CT chest 2018 (-), saw ENT 2018, negative flexible laryngoscopy, Rx voice therapy.  Also saw neurology   PLAN  ALS: Had a number of symptoms including dysphagia, throat pain, dysarthria, difficulty swallowing, spastic speech, was evaluated by speech therapy and Neurology, and after further evaluation was eventually DX with ALS.  Seen for the first time at the [facility] 07/17/2017. Was prescribed Riluzole and  Glycopyrrolate but could not tolerate Currently using trilogy a  device similar to a CPAP as well as a CoughAssist device. At the [facility] clinic he sees a number of providers including the MD, speech therapy, physical therapist and others.  Melissa reports that they have plenty of good information regards swallowing and prevention of aspiration. We will get that CMP and CBC DM: Check a A1c, diet controlled Atrial fibrillation: Anticoagulated.  Heart rate today 83 COPD: On the Spiriva, seems to be doing okay. Evaded TSH: Check a TSH today RTC 6 months. RTC if needed labs drawn for his ALS doctor  okay as well.

## 2017-08-13 NOTE — Telephone Encounter (Signed)
Age 84 years Wt 91.2 kg 05/23/2017 Saw Dr Burt Knack 09/20.2018 and has scheduled appt to see Dr Burt Knack on 08/30/2017 03/15/2017 SrCr 1.09 10/02/2016 Hgb 15.0 HCT 43.5 Refill done for Eliquis 5mg  q 12 hours as requested

## 2017-08-14 NOTE — Assessment & Plan Note (Signed)
ALS: Had a number of symptoms including dysphagia, throat pain, dysarthria, difficulty swallowing, spastic speech, was evaluated by speech therapy and Neurology, and after further evaluation was eventually DX with ALS.  Seen for the first time at the [facility] 07/17/2017. Was prescribed Riluzole and  Glycopyrrolate but could not tolerate Currently using trilogy a  device similar to a CPAP as well as a CoughAssist device. At the [facility] clinic he sees a number of providers including the MD, speech therapy, physical therapist and others.  Melissa reports that they have plenty of good information regards swallowing and prevention of aspiration. We will get that CMP and CBC DM: Check a A1c, diet controlled Atrial fibrillation: Anticoagulated.  Heart rate today 83 COPD: On the Spiriva, seems to be doing okay. Evaded TSH: Check a TSH today RTC 6 months. RTC if needed labs drawn for his ALS doctor  okay as well.

## 2017-08-28 ENCOUNTER — Other Ambulatory Visit: Payer: Self-pay | Admitting: Internal Medicine

## 2017-08-30 ENCOUNTER — Ambulatory Visit (INDEPENDENT_AMBULATORY_CARE_PROVIDER_SITE_OTHER): Payer: Medicare Other | Admitting: Cardiovascular Disease

## 2017-08-30 ENCOUNTER — Encounter: Payer: Self-pay | Admitting: Cardiovascular Disease

## 2017-08-30 VITALS — BP 132/80 | HR 90 | Ht 67.0 in | Wt 195.6 lb

## 2017-08-30 DIAGNOSIS — I480 Paroxysmal atrial fibrillation: Secondary | ICD-10-CM | POA: Diagnosis not present

## 2017-08-30 DIAGNOSIS — I25810 Atherosclerosis of coronary artery bypass graft(s) without angina pectoris: Secondary | ICD-10-CM

## 2017-08-30 DIAGNOSIS — I745 Embolism and thrombosis of iliac artery: Secondary | ICD-10-CM

## 2017-08-30 NOTE — Progress Notes (Signed)
Cardiology Office Note Date:  08/30/2017   ID:  Wadell, Craddock November 07, 1933, MRN [id number]  PCP:  Colon Branch, MD  Cardiologist:  [name], MD    Chief Complaint  Patient presents with  . Shortness of Breath     History of Present Illness: Fernando Moyer is a 84 y.o. male who presents for follow-up evaluation.  The patient has been followed for aortic valve disease status post bioprosthetic aortic valve replacement, peripheral arterial disease, and paroxysmal atrial fibrillation.  He is here with his granddaughter today.  Since his last visit he has been diagnosed with ALS.  He has been treated at a multidisciplinary clinic associated with [facility] and has been very happy with the care there.  He is developed difficulty with speech and swallowing.  He complains of shortness of breath which is unchanged over time.  He has had no recent chest pain or pressure.  No edema, orthopnea, or PND.  No heart palpitations.   Past Medical History:  Diagnosis Date  . AAA (abdominal aortic aneurysm) ([place]) 05/2008   PER CARDIAC CATH 3.5 X 3. 5 CM  . Aortic stenosis 05/2008   PER CARDIAC CATH, MILD, MITRAL STENOSIS  . Back pain    cause unknown but does get Cortisone injection every now and then  . Benign neoplasm of colon   . Bowen's disease    right arm BX: referred to dermatology  . Carotid artery occlusion    last u/s 3-11...Marland Kitchenper vasc.surgery 2007 cath: nonobstructive CAD. mil;d MS, trivial AoS, small AAA,  . Cataract    RIGHT removed  . Chronic back pain   . COPD (chronic obstructive pulmonary disease) ([place])   . Coronary artery disease 05/2008   MILD, MEDICAL MANAGEMENT  . Diabetes mellitus ([place]) 09/07/2011   no per pt- no medications  . Dyspnea    with exertion only  . GERD with stricture    w/hx of stricture  . History of nuclear stress test    Myoview 6/18: EF 51, normal perfusion; Low Risk  . Hyperlipidemia    takes Pravastatin daily  . Mitral stenosis  05/2008   PER CARDIAC CATH  . Myocardial infarction ([place]) 2012  . Peripheral neuropathy    right thigh; "it's been that way for years"  . Pulmonary hypertension ([place])   . PVD (peripheral vascular disease) ([place]) 12/07   per cath 12/07....iliac  . Stricture and stenosis of esophagus     Past Surgical History:  Procedure Laterality Date  . AORTIC VALVE REPLACEMENT  08/17/2011   Procedure: AORTIC VALVE REPLACEMENT (AVR);  Surgeon: Gaye Pollack, MD;  Location: [place];  Service: Open Heart Surgery;  Laterality: N/A;  . APPENDECTOMY    . CARDIAC CATHETERIZATION  02/2011   LAD 50, CFX OK, RCA40, EF 55%; PCWP 17, AoV  0.9 cm squared  . ENDARTERECTOMY  08-2010   Right carotid endarterectomy    . ESOPHAGEAL DILATION    . ESOPHAGOGASTRODUODENOSCOPY N/A 10/29/2015   Procedure: ESOPHAGOGASTRODUODENOSCOPY (EGD);  Surgeon: Jerene Bears, MD;  Location: [facility] ENDOSCOPY;  Service: Endoscopy;  Laterality: N/A;  . ESOPHAGOGASTRODUODENOSCOPY (EGD) WITH PROPOFOL N/A 11/10/2015   Procedure: ESOPHAGOGASTRODUODENOSCOPY (EGD) WITH PROPOFOL;  Surgeon: Milus Banister, MD;  Location: [facility] ENDOSCOPY;  Service: Endoscopy;  Laterality: N/A;  . ESOPHAGOGASTRODUODENOSCOPY (EGD) WITH PROPOFOL N/A 11/24/2015   Procedure: ESOPHAGOGASTRODUODENOSCOPY (EGD) WITH PROPOFOL;  Surgeon: Milus Banister, MD;  Location: [facility] ENDOSCOPY;  Service: Endoscopy;  Laterality: N/A;  dil  . eye lid surgery Bilateral ~ 07-2015  . EYE SURGERY  10/14/13   cataract surgery LEFT eye   . LARYNGOSCOPY  08/29/2016   transnasal  . LEFT AND RIGHT HEART CATHETERIZATION WITH CORONARY ANGIOGRAM N/A 02/23/2011   Procedure: LEFT AND RIGHT HEART CATHETERIZATION WITH CORONARY ANGIOGRAM;  Surgeon: [name], MD;  Location: [facility] C[facility] LAB;  Service: Cardiovascular;  Laterality: N/A;  . STERNAL WIRES REMOVAL N/A 05/01/2013   Procedure: STERNAL WIRES REMOVAL;  Surgeon: Gaye Pollack, MD;  Location: [facility] OR;  Service: Thoracic;  Laterality: N/A;    Current Outpatient  Medications  Medication Sig Dispense Refill  . acetaminophen (TYLENOL) 500 MG tablet Take 1,000 mg by mouth every 6 (six) hours as needed (pain). Reported on 04/21/2015    . ELIQUIS 5 MG TABS tablet TAKE 1 TABLET TWICE A DAY 180 tablet 1  . ferrous sulfate (FERROUSUL) 325 (65 FE) MG tablet Take 1 tablet (325 mg total) by mouth 2 (two) times daily with a meal. 180 tablet 0  . furosemide (LASIX) 40 MG tablet TAKE 1 TABLET DAILY 90 tablet 1  . KLOR-CON M20 20 MEQ tablet TAKE 1 TABLET DAILY 90 tablet 1  . omeprazole (PRILOSEC) 40 MG capsule Take 1 capsule (40 mg total) by mouth 2 (two) times daily. 180 capsule 3  . pravastatin (PRAVACHOL) 40 MG tablet Take 1 tablet (40 mg total) by mouth daily. 90 tablet 1  . tiotropium (SPIRIVA HANDIHALER) 18 MCG inhalation capsule Place 1 capsule (18 mcg total) into inhaler and inhale daily. 90 capsule 1  . traMADol (ULTRAM) 50 MG tablet Take 1 tablet (50 mg total) by mouth every 12 (twelve) hours as needed. for pain 60 tablet 1  . UNABLE TO FIND Check CMP monthly x 2 months. Fax results to dr caress at [place] [phone])    . UNABLE TO FIND Palliative Care referral for ALS and pain management.  Dx. ALS G12.21. Last MD eval: 07/17/2017.  [facility], [place] [phone].     No current facility-administered medications for this visit.     Allergies:   Patient has no known allergies.   Social History:  The patient  reports that he quit smoking about 30 years ago. His smoking use included cigarettes. He has a 60.00 pack-year smoking history. He has never used smokeless tobacco. He reports that he does not drink alcohol or use drugs.   Family History:  The patient's family history includes Heart attack (age of onset: 49) in his father; Prostate cancer (age of onset: 4) in his brother.   ROS:  Please see the history of present illness.  Otherwise, review of systems is positive for hearing loss, cough, exertional  dyspnea, blood in urine, back pain.  All other systems are reviewed and negative.   PHYSICAL EXAM: VS:  BP 132/80   Pulse 90   Ht 5\' 7"  (1.702 m)   Wt 195 lb 9.6 oz (88.7 kg)   SpO2 96%   BMI 30.64 kg/m  , BMI Body mass index is 30.64 kg/m. GEN: Well nourished, well developed, elderly male in no acute distress  HEENT: normal, speech difficult to understand Neck: no JVD, no masses. No carotid bruits Cardiac: RRR with 2/6 SEM at the RUSB            Respiratory:  clear to auscultation bilaterally, normal work of breathing GI: soft, nontender, nondistended, + BS MS: no deformity or atrophy  Ext:  no pretibial edema, pedal pulses 2+= bilaterally Skin: warm and dry, no rash Psych: euthymic mood, full affect  EKG:  EKG is ordered today. The ekg ordered today shows normal sinus rhythm 90 bpm, PACs, right bundle branch block, left anterior fascicular block, no change from previous tracings.  Recent Labs: 08/13/2017: ALT 7; BUN 10; Creatinine, Ser 0.89; Hemoglobin 14.5; Platelets 248.0; Potassium 4.3; Sodium 140; TSH 7.67   Lipid Panel     Component Value Date/Time   CHOL 120 03/15/2017 1408   TRIG 287.0 (H) 03/15/2017 1408   TRIG 96 03/20/2006 0945   HDL 31.40 (L) 03/15/2017 1408   CHOLHDL 4 03/15/2017 1408   VLDL 57.4 (H) 03/15/2017 1408   LDLCALC 78 06/17/2014 0933   LDLDIRECT 78.0 03/15/2017 1408      Wt Readings from Last 3 Encounters:  08/30/17 195 lb 9.6 oz (88.7 kg)  08/13/17 198 lb (89.8 kg)  05/23/17 201 lb (91.2 kg)     Cardiac Studies Reviewed: Echo 07/31/2017: Study Conclusions  - Procedure narrative: Transthoracic echocardiography. Image   quality was adequate. The study was technically difficult. - Left ventricle: The cavity size was normal. Wall thickness was   increased in a pattern of moderate LVH. Systolic function was   vigorous. The estimated ejection fraction was in the range of 65%   to 70%. Wall motion was normal; there were no regional wall    motion abnormalities. Doppler parameters are consistent with   abnormal left ventricular relaxation (grade 1 diastolic   dysfunction). - Aortic valve: Bioprosthetic aortic valve. The valve was poorly   visualized, but there does appear to be a degree of stenosis,   unsure if patient-prosthetic mismatch or leaflet restriction.   There was no regurgitation. - Mitral valve: Moderately to severely calcified annulus.   Moderately calcified leaflets . The findings are consistent with   mild stenosis. There was no significant regurgitation. Mean   gradient (D): 4 mm Hg. Valve area by pressure half-time: 2.19   cm^2. - Left atrium: The atrium was severely dilated. - Right ventricle: Poorly visualized. The cavity size was normal.   Systolic function was normal. - Tricuspid valve: Peak RV-RA gradient (S): 38 mm Hg. - Pulmonary arteries: PA peak pressure: 41 mm Hg (S). - Inferior vena cava: The vessel was normal in size. The   respirophasic diameter changes were in the normal range (>= 50%),   consistent with normal central venous pressure.  Impressions:  - Normal LV size with moderate LV hypertrophy. EF 65-70%. RV poorly   visualized but probably normal size and systolic function.   Bioprosthetic aortic valve poorly visualized. Mean gradient 22   mmHg suggesting a degree of stenosis, unsure whether   patient-prosthesis mismatch or actual valvular restriction. Mild   mitral stenosis with heavily calcified MV and apparatus. Mild   pulmonary hypertension.  ASSESSMENT AND PLAN: 1.  Aortic valve disease status post bioprosthetic aortic valve replacement: The patient has mild to moderate bioprosthetic aortic valve stenosis with a mean gradient of 22 mmHg.  Anticipate clinical follow-up.  I will see him back in 6 months.  2.  Paroxysmal atrial fibrillation: Maintaining sinus rhythm.  Continue apixaban for anticoagulation.  3.  Hypertension: Blood pressure controlled on current therapy.  4.   Hyperlipidemia: Treated with pravastatin.  The patient appears to be declining, now diagnosed with ALS.  He and his family are very happy with the excellent care they have received at the [place] clinic at [facility].  I  will plan to see him back in 6 months unless any cardiac problems arise.  Current medicines are reviewed with the patient today.  The patient does not have concerns regarding medicines.  Labs/ tests ordered today include:   Orders Placed This Encounter  Procedures  . EKG 12-Lead    Disposition:   FU 6 months or as needed  Signed, [name], MD  08/30/2017 12:41 PM    [facility] [address], [place], [place]  [address] Phone: [phone]; Fax: [phone]

## 2017-08-30 NOTE — Patient Instructions (Signed)

## 2017-09-13 DIAGNOSIS — R1310 Dysphagia, unspecified: Secondary | ICD-10-CM | POA: Diagnosis not present

## 2017-09-13 DIAGNOSIS — G1221 Amyotrophic lateral sclerosis: Secondary | ICD-10-CM | POA: Diagnosis not present

## 2017-09-13 DIAGNOSIS — G4736 Sleep related hypoventilation in conditions classified elsewhere: Secondary | ICD-10-CM | POA: Diagnosis not present

## 2017-09-13 DIAGNOSIS — R471 Dysarthria and anarthria: Secondary | ICD-10-CM | POA: Diagnosis not present

## 2017-09-13 DIAGNOSIS — Z515 Encounter for palliative care: Secondary | ICD-10-CM | POA: Diagnosis not present

## 2017-09-13 DIAGNOSIS — J449 Chronic obstructive pulmonary disease, unspecified: Secondary | ICD-10-CM | POA: Diagnosis not present

## 2017-10-16 DIAGNOSIS — G1221 Amyotrophic lateral sclerosis: Secondary | ICD-10-CM | POA: Diagnosis not present

## 2017-10-18 DIAGNOSIS — R471 Dysarthria and anarthria: Secondary | ICD-10-CM | POA: Diagnosis not present

## 2017-10-18 DIAGNOSIS — G4736 Sleep related hypoventilation in conditions classified elsewhere: Secondary | ICD-10-CM | POA: Diagnosis not present

## 2017-10-18 DIAGNOSIS — J449 Chronic obstructive pulmonary disease, unspecified: Secondary | ICD-10-CM | POA: Diagnosis not present

## 2017-10-18 DIAGNOSIS — R1310 Dysphagia, unspecified: Secondary | ICD-10-CM | POA: Diagnosis not present

## 2017-10-18 DIAGNOSIS — G1221 Amyotrophic lateral sclerosis: Secondary | ICD-10-CM | POA: Diagnosis not present

## 2017-10-18 DIAGNOSIS — Z515 Encounter for palliative care: Secondary | ICD-10-CM | POA: Diagnosis not present

## 2017-10-31 HISTORY — PX: PEG TUBE PLACEMENT: SUR1034

## 2017-11-26 DIAGNOSIS — I480 Paroxysmal atrial fibrillation: Secondary | ICD-10-CM | POA: Diagnosis not present

## 2017-11-26 DIAGNOSIS — R1310 Dysphagia, unspecified: Secondary | ICD-10-CM | POA: Diagnosis not present

## 2017-11-26 DIAGNOSIS — G1221 Amyotrophic lateral sclerosis: Secondary | ICD-10-CM | POA: Diagnosis not present

## 2017-11-26 DIAGNOSIS — I350 Nonrheumatic aortic (valve) stenosis: Secondary | ICD-10-CM | POA: Diagnosis not present

## 2017-11-26 DIAGNOSIS — I714 Abdominal aortic aneurysm, without rupture: Secondary | ICD-10-CM | POA: Diagnosis not present

## 2017-11-27 DIAGNOSIS — I480 Paroxysmal atrial fibrillation: Secondary | ICD-10-CM | POA: Diagnosis not present

## 2017-11-27 DIAGNOSIS — I350 Nonrheumatic aortic (valve) stenosis: Secondary | ICD-10-CM | POA: Diagnosis not present

## 2017-11-27 DIAGNOSIS — Z8679 Personal history of other diseases of the circulatory system: Secondary | ICD-10-CM | POA: Diagnosis not present

## 2017-11-27 DIAGNOSIS — Z952 Presence of prosthetic heart valve: Secondary | ICD-10-CM | POA: Diagnosis not present

## 2017-11-27 DIAGNOSIS — I4891 Unspecified atrial fibrillation: Secondary | ICD-10-CM | POA: Diagnosis not present

## 2017-11-27 DIAGNOSIS — Z931 Gastrostomy status: Secondary | ICD-10-CM | POA: Diagnosis not present

## 2017-11-27 DIAGNOSIS — G1221 Amyotrophic lateral sclerosis: Secondary | ICD-10-CM | POA: Diagnosis not present

## 2017-11-27 DIAGNOSIS — I714 Abdominal aortic aneurysm, without rupture: Secondary | ICD-10-CM | POA: Diagnosis not present

## 2017-11-27 DIAGNOSIS — R1310 Dysphagia, unspecified: Secondary | ICD-10-CM | POA: Diagnosis not present

## 2017-11-27 DIAGNOSIS — Z9889 Other specified postprocedural states: Secondary | ICD-10-CM | POA: Diagnosis not present

## 2017-12-13 DIAGNOSIS — G4736 Sleep related hypoventilation in conditions classified elsewhere: Secondary | ICD-10-CM | POA: Diagnosis not present

## 2017-12-13 DIAGNOSIS — R1310 Dysphagia, unspecified: Secondary | ICD-10-CM | POA: Diagnosis not present

## 2017-12-13 DIAGNOSIS — J449 Chronic obstructive pulmonary disease, unspecified: Secondary | ICD-10-CM | POA: Diagnosis not present

## 2017-12-13 DIAGNOSIS — Z515 Encounter for palliative care: Secondary | ICD-10-CM | POA: Diagnosis not present

## 2017-12-13 DIAGNOSIS — G1221 Amyotrophic lateral sclerosis: Secondary | ICD-10-CM | POA: Diagnosis not present

## 2017-12-13 DIAGNOSIS — R471 Dysarthria and anarthria: Secondary | ICD-10-CM | POA: Diagnosis not present

## 2017-12-16 ENCOUNTER — Other Ambulatory Visit: Payer: Self-pay

## 2017-12-16 MED ORDER — OMEPRAZOLE 40 MG PO CPDR: 40.0000 mg | DELAYED_RELEASE_CAPSULE | Freq: Two times a day (BID) | ORAL | 3 refills | Status: AC

## 2018-01-02 DIAGNOSIS — R1013 Epigastric pain: Secondary | ICD-10-CM | POA: Diagnosis not present

## 2018-01-02 DIAGNOSIS — Z931 Gastrostomy status: Secondary | ICD-10-CM | POA: Diagnosis not present

## 2018-01-02 DIAGNOSIS — K5900 Constipation, unspecified: Secondary | ICD-10-CM | POA: Diagnosis not present

## 2018-01-02 DIAGNOSIS — R1012 Left upper quadrant pain: Secondary | ICD-10-CM | POA: Diagnosis not present

## 2018-01-02 DIAGNOSIS — R634 Abnormal weight loss: Secondary | ICD-10-CM | POA: Diagnosis not present

## 2018-01-02 DIAGNOSIS — G8929 Other chronic pain: Secondary | ICD-10-CM | POA: Diagnosis not present

## 2018-01-02 DIAGNOSIS — R109 Unspecified abdominal pain: Secondary | ICD-10-CM | POA: Diagnosis not present

## 2018-01-02 DIAGNOSIS — G1221 Amyotrophic lateral sclerosis: Secondary | ICD-10-CM | POA: Diagnosis not present

## 2018-01-06 DIAGNOSIS — I77811 Abdominal aortic ectasia: Secondary | ICD-10-CM | POA: Diagnosis not present

## 2018-01-06 DIAGNOSIS — K449 Diaphragmatic hernia without obstruction or gangrene: Secondary | ICD-10-CM | POA: Diagnosis not present

## 2018-01-06 DIAGNOSIS — I700 Atherosclerosis of aorta: Secondary | ICD-10-CM | POA: Diagnosis not present

## 2018-01-06 DIAGNOSIS — K8020 Calculus of gallbladder without cholecystitis without obstruction: Secondary | ICD-10-CM | POA: Diagnosis not present

## 2018-01-08 DIAGNOSIS — Z791 Long term (current) use of non-steroidal anti-inflammatories (NSAID): Secondary | ICD-10-CM | POA: Diagnosis not present

## 2018-01-08 DIAGNOSIS — K5900 Constipation, unspecified: Secondary | ICD-10-CM | POA: Diagnosis not present

## 2018-01-08 DIAGNOSIS — M1990 Unspecified osteoarthritis, unspecified site: Secondary | ICD-10-CM | POA: Diagnosis not present

## 2018-01-08 DIAGNOSIS — R109 Unspecified abdominal pain: Secondary | ICD-10-CM | POA: Diagnosis not present

## 2018-01-08 DIAGNOSIS — G1221 Amyotrophic lateral sclerosis: Secondary | ICD-10-CM | POA: Diagnosis not present

## 2018-01-08 DIAGNOSIS — Z79899 Other long term (current) drug therapy: Secondary | ICD-10-CM | POA: Diagnosis not present

## 2018-01-08 DIAGNOSIS — R1312 Dysphagia, oropharyngeal phase: Secondary | ICD-10-CM | POA: Diagnosis not present

## 2018-01-10 ENCOUNTER — Encounter: Payer: Self-pay | Admitting: Internal Medicine

## 2018-01-10 MED ORDER — TRAMADOL HCL 50 MG PO TABS
50.0000 mg | ORAL_TABLET | Freq: Two times a day (BID) | ORAL | 0 refills | Status: AC | PRN
Start: 2018-01-10 — End: ?

## 2018-01-10 NOTE — Telephone Encounter (Signed)
Pt is requesting refill on tramadol.   Last OV: 08/13/2017 Last Fill: 01/29/2017 #60 and 1RF UDS: None

## 2018-01-10 NOTE — Telephone Encounter (Signed)
Sent #30

## 2018-01-15 ENCOUNTER — Emergency Department (HOSPITAL_BASED_OUTPATIENT_CLINIC_OR_DEPARTMENT_OTHER): Payer: Medicare Other

## 2018-01-15 ENCOUNTER — Inpatient Hospital Stay (HOSPITAL_COMMUNITY): Payer: Medicare Other

## 2018-01-15 ENCOUNTER — Other Ambulatory Visit: Payer: Self-pay

## 2018-01-15 ENCOUNTER — Ambulatory Visit (INDEPENDENT_AMBULATORY_CARE_PROVIDER_SITE_OTHER): Payer: Medicare Other | Admitting: Internal Medicine

## 2018-01-15 ENCOUNTER — Inpatient Hospital Stay (HOSPITAL_BASED_OUTPATIENT_CLINIC_OR_DEPARTMENT_OTHER)
Admission: EM | Admit: 2018-01-15 | Discharge: 2018-01-31 | DRG: 871 | Disposition: E | Payer: Medicare Other | Attending: Internal Medicine | Admitting: Internal Medicine

## 2018-01-15 VITALS — BP 136/78 | HR 76 | Temp 98.0°F | Resp 16 | Ht 67.0 in | Wt 171.5 lb

## 2018-01-15 DIAGNOSIS — E870 Hyperosmolality and hypernatremia: Secondary | ICD-10-CM | POA: Diagnosis not present

## 2018-01-15 DIAGNOSIS — I745 Embolism and thrombosis of iliac artery: Secondary | ICD-10-CM

## 2018-01-15 DIAGNOSIS — J189 Pneumonia, unspecified organism: Secondary | ICD-10-CM | POA: Diagnosis not present

## 2018-01-15 DIAGNOSIS — R69 Illness, unspecified: Secondary | ICD-10-CM

## 2018-01-15 DIAGNOSIS — K449 Diaphragmatic hernia without obstruction or gangrene: Secondary | ICD-10-CM | POA: Diagnosis present

## 2018-01-15 DIAGNOSIS — Z66 Do not resuscitate: Secondary | ICD-10-CM | POA: Diagnosis present

## 2018-01-15 DIAGNOSIS — R0602 Shortness of breath: Secondary | ICD-10-CM

## 2018-01-15 DIAGNOSIS — J449 Chronic obstructive pulmonary disease, unspecified: Secondary | ICD-10-CM | POA: Diagnosis present

## 2018-01-15 DIAGNOSIS — D500 Iron deficiency anemia secondary to blood loss (chronic): Secondary | ICD-10-CM | POA: Diagnosis not present

## 2018-01-15 DIAGNOSIS — I2510 Atherosclerotic heart disease of native coronary artery without angina pectoris: Secondary | ICD-10-CM | POA: Diagnosis present

## 2018-01-15 DIAGNOSIS — D649 Anemia, unspecified: Secondary | ICD-10-CM

## 2018-01-15 DIAGNOSIS — Z7951 Long term (current) use of inhaled steroids: Secondary | ICD-10-CM

## 2018-01-15 DIAGNOSIS — E872 Acidosis: Secondary | ICD-10-CM | POA: Diagnosis present

## 2018-01-15 DIAGNOSIS — I272 Pulmonary hypertension, unspecified: Secondary | ICD-10-CM | POA: Diagnosis present

## 2018-01-15 DIAGNOSIS — J9601 Acute respiratory failure with hypoxia: Secondary | ICD-10-CM | POA: Diagnosis not present

## 2018-01-15 DIAGNOSIS — K921 Melena: Secondary | ICD-10-CM | POA: Diagnosis present

## 2018-01-15 DIAGNOSIS — Z7901 Long term (current) use of anticoagulants: Secondary | ICD-10-CM | POA: Diagnosis not present

## 2018-01-15 DIAGNOSIS — J690 Pneumonitis due to inhalation of food and vomit: Secondary | ICD-10-CM | POA: Diagnosis present

## 2018-01-15 DIAGNOSIS — E876 Hypokalemia: Secondary | ICD-10-CM | POA: Diagnosis present

## 2018-01-15 DIAGNOSIS — Z952 Presence of prosthetic heart valve: Secondary | ICD-10-CM

## 2018-01-15 DIAGNOSIS — Z79899 Other long term (current) drug therapy: Secondary | ICD-10-CM | POA: Diagnosis not present

## 2018-01-15 DIAGNOSIS — Z931 Gastrostomy status: Secondary | ICD-10-CM | POA: Diagnosis not present

## 2018-01-15 DIAGNOSIS — K429 Umbilical hernia without obstruction or gangrene: Secondary | ICD-10-CM | POA: Diagnosis not present

## 2018-01-15 DIAGNOSIS — E785 Hyperlipidemia, unspecified: Secondary | ICD-10-CM | POA: Diagnosis present

## 2018-01-15 DIAGNOSIS — I252 Old myocardial infarction: Secondary | ICD-10-CM | POA: Diagnosis not present

## 2018-01-15 DIAGNOSIS — D62 Acute posthemorrhagic anemia: Secondary | ICD-10-CM | POA: Diagnosis present

## 2018-01-15 DIAGNOSIS — Z7189 Other specified counseling: Secondary | ICD-10-CM | POA: Diagnosis not present

## 2018-01-15 DIAGNOSIS — J90 Pleural effusion, not elsewhere classified: Secondary | ICD-10-CM | POA: Diagnosis not present

## 2018-01-15 DIAGNOSIS — I10 Essential (primary) hypertension: Secondary | ICD-10-CM | POA: Diagnosis present

## 2018-01-15 DIAGNOSIS — Z87891 Personal history of nicotine dependence: Secondary | ICD-10-CM

## 2018-01-15 DIAGNOSIS — J9690 Respiratory failure, unspecified, unspecified whether with hypoxia or hypercapnia: Secondary | ICD-10-CM | POA: Diagnosis not present

## 2018-01-15 DIAGNOSIS — G9340 Encephalopathy, unspecified: Secondary | ICD-10-CM | POA: Diagnosis not present

## 2018-01-15 DIAGNOSIS — E1142 Type 2 diabetes mellitus with diabetic polyneuropathy: Secondary | ICD-10-CM | POA: Diagnosis present

## 2018-01-15 DIAGNOSIS — A419 Sepsis, unspecified organism: Principal | ICD-10-CM | POA: Diagnosis present

## 2018-01-15 DIAGNOSIS — R1012 Left upper quadrant pain: Secondary | ICD-10-CM

## 2018-01-15 DIAGNOSIS — Z515 Encounter for palliative care: Secondary | ICD-10-CM

## 2018-01-15 DIAGNOSIS — G1221 Amyotrophic lateral sclerosis: Secondary | ICD-10-CM | POA: Diagnosis present

## 2018-01-15 DIAGNOSIS — Z4682 Encounter for fitting and adjustment of non-vascular catheter: Secondary | ICD-10-CM | POA: Diagnosis not present

## 2018-01-15 DIAGNOSIS — K922 Gastrointestinal hemorrhage, unspecified: Secondary | ICD-10-CM | POA: Diagnosis not present

## 2018-01-15 DIAGNOSIS — E871 Hypo-osmolality and hyponatremia: Secondary | ICD-10-CM | POA: Diagnosis present

## 2018-01-15 DIAGNOSIS — N400 Enlarged prostate without lower urinary tract symptoms: Secondary | ICD-10-CM | POA: Diagnosis not present

## 2018-01-15 DIAGNOSIS — J9811 Atelectasis: Secondary | ICD-10-CM | POA: Diagnosis not present

## 2018-01-15 DIAGNOSIS — E1151 Type 2 diabetes mellitus with diabetic peripheral angiopathy without gangrene: Secondary | ICD-10-CM | POA: Diagnosis present

## 2018-01-15 DIAGNOSIS — Z9911 Dependence on respirator [ventilator] status: Secondary | ICD-10-CM

## 2018-01-15 DIAGNOSIS — I361 Nonrheumatic tricuspid (valve) insufficiency: Secondary | ICD-10-CM | POA: Diagnosis not present

## 2018-01-15 DIAGNOSIS — R918 Other nonspecific abnormal finding of lung field: Secondary | ICD-10-CM | POA: Diagnosis not present

## 2018-01-15 DIAGNOSIS — I480 Paroxysmal atrial fibrillation: Secondary | ICD-10-CM | POA: Diagnosis present

## 2018-01-15 HISTORY — DX: Amyotrophic lateral sclerosis: G12.21

## 2018-01-15 LAB — BLOOD GAS, ARTERIAL
ACID-BASE DEFICIT: 1.5 mmol/L (ref 0.0–2.0)
Bicarbonate: 23.6 mmol/L (ref 20.0–28.0)
Drawn by: 406621
FIO2: 50
MECHVT: 520 mL
O2 SAT: 98 %
PEEP: 10 cmH2O
PH ART: 7.329 — AB (ref 7.350–7.450)
Patient temperature: 98.6
RATE: 14 resp/min
pCO2 arterial: 46.1 mmHg (ref 32.0–48.0)
pO2, Arterial: 105 mmHg (ref 83.0–108.0)

## 2018-01-15 LAB — MRSA PCR SCREENING: MRSA BY PCR: NEGATIVE

## 2018-01-15 LAB — CBC WITH DIFFERENTIAL/PLATELET
Abs Immature Granulocytes: 0.1 10*3/uL — ABNORMAL HIGH (ref 0.00–0.07)
BASOS ABS: 0.1 10*3/uL (ref 0.0–0.1)
Basophils Relative: 0 %
Eosinophils Absolute: 0 10*3/uL (ref 0.0–0.5)
Eosinophils Relative: 0 %
HCT: 27.5 % — ABNORMAL LOW (ref 39.0–52.0)
HEMOGLOBIN: 7.7 g/dL — AB (ref 13.0–17.0)
Immature Granulocytes: 1 %
LYMPHS PCT: 5 %
Lymphs Abs: 0.9 10*3/uL (ref 0.7–4.0)
MCH: 23.3 pg — ABNORMAL LOW (ref 26.0–34.0)
MCHC: 28 g/dL — ABNORMAL LOW (ref 30.0–36.0)
MCV: 83.1 fL (ref 80.0–100.0)
Monocytes Absolute: 1.1 10*3/uL — ABNORMAL HIGH (ref 0.1–1.0)
Monocytes Relative: 6 %
NEUTROS ABS: 15.1 10*3/uL — AB (ref 1.7–7.7)
NRBC: 0 % (ref 0.0–0.2)
Neutrophils Relative %: 88 %
Platelets: 333 10*3/uL (ref 150–400)
RBC: 3.31 MIL/uL — AB (ref 4.22–5.81)
RDW: 16.2 % — ABNORMAL HIGH (ref 11.5–15.5)
WBC: 17.2 10*3/uL — AB (ref 4.0–10.5)

## 2018-01-15 LAB — PREPARE RBC (CROSSMATCH)

## 2018-01-15 LAB — COMPREHENSIVE METABOLIC PANEL
ALBUMIN: 3.2 g/dL — AB (ref 3.5–5.0)
ALK PHOS: 82 U/L (ref 38–126)
ALT: 10 U/L (ref 0–44)
ANION GAP: 9 (ref 5–15)
AST: 25 U/L (ref 15–41)
BILIRUBIN TOTAL: 2.2 mg/dL — AB (ref 0.3–1.2)
BUN: 18 mg/dL (ref 8–23)
CO2: 24 mmol/L (ref 22–32)
Calcium: 8.7 mg/dL — ABNORMAL LOW (ref 8.9–10.3)
Chloride: 105 mmol/L (ref 98–111)
Creatinine, Ser: 1.13 mg/dL (ref 0.61–1.24)
GFR calc Af Amer: >60 mL/min (ref >60)
GFR calc non Af Amer: 59 mL/min — ABNORMAL LOW (ref >60)
GLUCOSE: 168 mg/dL — AB (ref 70–99)
Potassium: 4.7 mmol/L (ref 3.5–5.1)
SODIUM: 138 mmol/L (ref 135–145)
TOTAL PROTEIN: 6.4 g/dL — AB (ref 6.5–8.1)

## 2018-01-15 LAB — GLUCOSE, CAPILLARY
Glucose-Capillary: 136 mg/dL — ABNORMAL HIGH (ref 70–99)
Glucose-Capillary: 83 mg/dL (ref 70–99)

## 2018-01-15 LAB — CBC
HEMATOCRIT: 22.9 % — AB (ref 39.0–52.0)
HEMOGLOBIN: 6.5 g/dL — AB (ref 13.0–17.0)
MCH: 23.2 pg — AB (ref 26.0–34.0)
MCHC: 28.4 g/dL — AB (ref 30.0–36.0)
MCV: 81.8 fL (ref 80.0–100.0)
Platelets: 400 10*3/uL (ref 150–400)
RBC: 2.8 MIL/uL — AB (ref 4.22–5.81)
RDW: 16.5 % — ABNORMAL HIGH (ref 11.5–15.5)
WBC: 15 10*3/uL — ABNORMAL HIGH (ref 4.0–10.5)
nRBC: 0 % (ref 0.0–0.2)

## 2018-01-15 LAB — URINALYSIS, ROUTINE W REFLEX MICROSCOPIC
Bilirubin Urine: NEGATIVE
Bilirubin Urine: NEGATIVE
Glucose, UA: NEGATIVE mg/dL
Glucose, UA: NEGATIVE mg/dL
Ketones, ur: NEGATIVE mg/dL
Ketones, ur: NEGATIVE mg/dL
LEUKOCYTES UA: NEGATIVE
LEUKOCYTES UA: NEGATIVE
Nitrite: NEGATIVE
Nitrite: NEGATIVE
PROTEIN: 100 mg/dL — AB
PROTEIN: 100 mg/dL — AB
Specific Gravity, Urine: >1.03 — ABNORMAL HIGH (ref 1.005–1.030)
Specific Gravity, Urine: >1.03 — ABNORMAL HIGH (ref 1.005–1.030)
pH: 5.5 (ref 5.0–8.0)
pH: 5.5 (ref 5.0–8.0)

## 2018-01-15 LAB — PROTIME-INR
INR: 1.36
Prothrombin Time: 16.6 seconds — ABNORMAL HIGH (ref 11.4–15.2)

## 2018-01-15 LAB — I-STAT ARTERIAL BLOOD GAS, ED
Acid-base deficit: 1 mmol/L (ref 0.0–2.0)
Bicarbonate: 24.8 mmol/L (ref 20.0–28.0)
O2 Saturation: 100 %
PCO2 ART: 44.6 mmHg (ref 32.0–48.0)
PH ART: 7.351 (ref 7.350–7.450)
Patient temperature: 97.9
TCO2: 26 mmol/L (ref 22–32)
pO2, Arterial: 316 mmHg — ABNORMAL HIGH (ref 83.0–108.0)

## 2018-01-15 LAB — URINALYSIS, MICROSCOPIC (REFLEX)
Bacteria, UA: NONE SEEN
WBC UA: NONE SEEN WBC/hpf (ref 0–5)

## 2018-01-15 LAB — BASIC METABOLIC PANEL
ANION GAP: 11 (ref 5–15)
BUN: 19 mg/dL (ref 8–23)
CHLORIDE: 99 mmol/L (ref 98–111)
CO2: 25 mmol/L (ref 22–32)
Calcium: 9.2 mg/dL (ref 8.9–10.3)
Creatinine, Ser: 0.97 mg/dL (ref 0.61–1.24)
GFR calc Af Amer: >60 mL/min (ref >60)
GLUCOSE: 141 mg/dL — AB (ref 70–99)
POTASSIUM: 4.1 mmol/L (ref 3.5–5.1)
Sodium: 135 mmol/L (ref 135–145)

## 2018-01-15 LAB — LACTIC ACID, PLASMA: Lactic Acid, Venous: 3.5 mmol/L (ref 0.5–1.9)

## 2018-01-15 LAB — MAGNESIUM: Magnesium: 2.3 mg/dL (ref 1.7–2.4)

## 2018-01-15 LAB — LIPASE, BLOOD: Lipase: 36 U/L (ref 11–51)

## 2018-01-15 LAB — CORTISOL: Cortisol, Plasma: 22.1 ug/dL

## 2018-01-15 LAB — PROCALCITONIN: Procalcitonin: 0.1 ng/mL

## 2018-01-15 LAB — PHOSPHORUS: Phosphorus: 5 mg/dL — ABNORMAL HIGH (ref 2.5–4.6)

## 2018-01-15 LAB — APTT: APTT: 28 s (ref 24–36)

## 2018-01-15 LAB — TROPONIN I: Troponin I: 0.11 ng/mL (ref <0.03)

## 2018-01-15 LAB — I-STAT CG4 LACTIC ACID, ED: Lactic Acid, Venous: 8.8 mmol/L (ref 0.5–1.9)

## 2018-01-15 LAB — BRAIN NATRIURETIC PEPTIDE: B Natriuretic Peptide: 411.8 pg/mL — ABNORMAL HIGH (ref 0.0–100.0)

## 2018-01-15 LAB — TRIGLYCERIDES: TRIGLYCERIDES: 91 mg/dL (ref <150)

## 2018-01-15 MED ORDER — CEFEPIME HCL 1 G IJ SOLR
INTRAMUSCULAR | Status: AC
  Administered 2018-01-15: 15:00:00
  Filled 2018-01-15: qty 1

## 2018-01-15 MED ORDER — PROPOFOL 1000 MG/100ML IV EMUL: 5.0000 ug/kg/min | Freq: Once | INTRAVENOUS | Status: DC

## 2018-01-15 MED ORDER — ORAL CARE MOUTH RINSE
15.0000 mL | OROMUCOSAL | Status: DC
  Administered 2018-01-15 – 2018-01-19 (×32): 15 mL via OROMUCOSAL

## 2018-01-15 MED ORDER — ROCURONIUM BROMIDE 50 MG/5ML IV SOLN
INTRAVENOUS | Status: AC | PRN
  Administered 2018-01-15: 80 mg via INTRAVENOUS

## 2018-01-15 MED ORDER — PANTOPRAZOLE SODIUM 40 MG PO PACK
40.0000 mg | PACK | Freq: Two times a day (BID) | ORAL | Status: DC
  Administered 2018-01-15 – 2018-01-19 (×8): 40 mg
  Filled 2018-01-15 (×8): qty 20

## 2018-01-15 MED ORDER — FENTANYL CITRATE (PF) 100 MCG/2ML IJ SOLN
50.0000 ug | INTRAMUSCULAR | Status: DC | PRN
  Administered 2018-01-17: 50 ug via INTRAVENOUS
  Filled 2018-01-15 (×3): qty 2

## 2018-01-15 MED ORDER — SODIUM CHLORIDE 0.9 % IV SOLN
1.0000 g | Freq: Once | INTRAVENOUS | Status: AC
  Administered 2018-01-15: 1 g via INTRAVENOUS

## 2018-01-15 MED ORDER — FAMOTIDINE IN NACL 20-0.9 MG/50ML-% IV SOLN: 20.0000 mg | Freq: Two times a day (BID) | INTRAVENOUS | Status: DC

## 2018-01-15 MED ORDER — SODIUM CHLORIDE 0.9% IV SOLUTION: Freq: Once | INTRAVENOUS | Status: AC

## 2018-01-15 MED ORDER — CHLORHEXIDINE GLUCONATE 0.12% ORAL RINSE (MEDLINE KIT)
15.0000 mL | Freq: Two times a day (BID) | OROMUCOSAL | Status: DC
  Administered 2018-01-15 – 2018-01-18 (×6): 15 mL via OROMUCOSAL

## 2018-01-15 MED ORDER — PIPERACILLIN-TAZOBACTAM 3.375 G IVPB
3.3750 g | Freq: Three times a day (TID) | INTRAVENOUS | Status: DC
  Administered 2018-01-15 – 2018-01-19 (×11): 3.375 g via INTRAVENOUS
  Filled 2018-01-15 (×12): qty 50

## 2018-01-15 MED ORDER — MIDAZOLAM HCL 5 MG/5ML IJ SOLN
INTRAMUSCULAR | Status: AC
  Filled 2018-01-15: qty 20

## 2018-01-15 MED ORDER — SODIUM CHLORIDE 0.9 % IV SOLN: 10.0000 mL/h | Freq: Once | INTRAVENOUS | Status: DC

## 2018-01-15 MED ORDER — PROPOFOL 1000 MG/100ML IV EMUL
5.0000 ug/kg/min | Freq: Once | INTRAVENOUS | Status: AC
  Administered 2018-01-15: 5 ug/kg/min via INTRAVENOUS

## 2018-01-15 MED ORDER — PROPOFOL 1000 MG/100ML IV EMUL
0.0000 ug/kg/min | INTRAVENOUS | Status: DC
  Administered 2018-01-15: 20 ug/kg/min via INTRAVENOUS
  Administered 2018-01-15 – 2018-01-17 (×6): 40 ug/kg/min via INTRAVENOUS
  Administered 2018-01-17 (×3): 50 ug/kg/min via INTRAVENOUS
  Administered 2018-01-17: 40 ug/kg/min via INTRAVENOUS
  Administered 2018-01-17 – 2018-01-18 (×4): 50 ug/kg/min via INTRAVENOUS
  Administered 2018-01-18: 25 ug/kg/min via INTRAVENOUS
  Administered 2018-01-18 – 2018-01-19 (×3): 50 ug/kg/min via INTRAVENOUS
  Filled 2018-01-15 (×5): qty 100
  Filled 2018-01-15: qty 200
  Filled 2018-01-15 (×11): qty 100

## 2018-01-15 MED ORDER — ETOMIDATE 2 MG/ML IV SOLN: 0.3000 mg/kg | Freq: Once | INTRAVENOUS | Status: DC

## 2018-01-15 MED ORDER — PROPOFOL 1000 MG/100ML IV EMUL
INTRAVENOUS | Status: AC
  Administered 2018-01-15: 5 ug/kg/min via INTRAVENOUS
  Filled 2018-01-15: qty 100

## 2018-01-15 MED ORDER — VANCOMYCIN HCL 500 MG IV SOLR
INTRAVENOUS | Status: AC
  Filled 2018-01-15: qty 500

## 2018-01-15 MED ORDER — SODIUM CHLORIDE 0.9 % IV SOLN
INTRAVENOUS | Status: AC | PRN
  Administered 2018-01-15: 1000 mL via INTRAVENOUS

## 2018-01-15 MED ORDER — ETOMIDATE 2 MG/ML IV SOLN
INTRAVENOUS | Status: AC | PRN
  Administered 2018-01-15: 20 mg via INTRAVENOUS

## 2018-01-15 MED ORDER — MIDAZOLAM HCL 5 MG/5ML IJ SOLN
INTRAMUSCULAR | Status: AC | PRN
  Administered 2018-01-15: 4 mg via INTRAVENOUS

## 2018-01-15 MED ORDER — SODIUM CHLORIDE 0.9 % IV SOLN
INTRAVENOUS | Status: DC | PRN
  Administered 2018-01-17 – 2018-01-18 (×3): via INTRAVENOUS

## 2018-01-15 MED ORDER — VANCOMYCIN HCL IN DEXTROSE 750-5 MG/150ML-% IV SOLN
750.0000 mg | Freq: Two times a day (BID) | INTRAVENOUS | Status: DC
  Administered 2018-01-16 – 2018-01-17 (×3): 750 mg via INTRAVENOUS
  Filled 2018-01-15 (×5): qty 150

## 2018-01-15 MED ORDER — SODIUM CHLORIDE 0.9% IV SOLUTION
Freq: Once | INTRAVENOUS | Status: DC
  Filled 2018-01-15: qty 250

## 2018-01-15 MED ORDER — SODIUM CHLORIDE 0.9% FLUSH: 10.0000 mL | INTRAVENOUS | Status: DC | PRN

## 2018-01-15 MED ORDER — ROCURONIUM BROMIDE 50 MG/5ML IV SOLN: 1.0000 mg/kg | Freq: Once | INTRAVENOUS | Status: DC

## 2018-01-15 MED ORDER — FENTANYL CITRATE (PF) 100 MCG/2ML IJ SOLN
50.0000 ug | INTRAMUSCULAR | Status: DC | PRN
  Administered 2018-01-16 – 2018-01-18 (×9): 50 ug via INTRAVENOUS
  Filled 2018-01-15 (×8): qty 2

## 2018-01-15 MED ORDER — SODIUM CHLORIDE 0.9 % IV SOLN
INTRAVENOUS | Status: DC
  Administered 2018-01-15: 19:00:00 via INTRAVENOUS

## 2018-01-15 MED ORDER — SODIUM CHLORIDE 0.9% IV SOLUTION
Freq: Once | INTRAVENOUS | Status: AC
  Administered 2018-01-15: 16:00:00 via INTRAVENOUS
  Filled 2018-01-15: qty 250

## 2018-01-15 MED ORDER — SODIUM CHLORIDE 0.9% IV SOLUTION
Freq: Once | INTRAVENOUS | Status: AC
  Administered 2018-01-15: 18:00:00 via INTRAVENOUS

## 2018-01-15 MED ORDER — SODIUM CHLORIDE 0.9 % IV BOLUS: 30.0000 mL/kg | Freq: Once | INTRAVENOUS | Status: AC

## 2018-01-15 MED ORDER — VANCOMYCIN HCL 1000 MG IV SOLR
INTRAVENOUS | Status: AC
  Filled 2018-01-15: qty 1000

## 2018-01-15 MED ORDER — INSULIN ASPART 100 UNIT/ML ~~LOC~~ SOLN
1.0000 [IU] | SUBCUTANEOUS | Status: DC
  Administered 2018-01-15: 1 [IU] via SUBCUTANEOUS

## 2018-01-15 MED ORDER — SODIUM CHLORIDE 0.9% FLUSH
10.0000 mL | Freq: Two times a day (BID) | INTRAVENOUS | Status: DC
  Administered 2018-01-15 – 2018-01-19 (×6): 10 mL

## 2018-01-15 MED ORDER — VANCOMYCIN HCL 10 G IV SOLR
1500.0000 mg | Freq: Once | INTRAVENOUS | Status: AC
  Administered 2018-01-15: 1500 mg via INTRAVENOUS
  Filled 2018-01-15: qty 1500

## 2018-01-15 NOTE — Progress Notes (Signed)
Pre visit review using our clinic review tool, if applicable. No additional management support is needed unless otherwise documented below in the visit note. 

## 2018-01-15 NOTE — Progress Notes (Signed)
Pharmacy Antibiotic Note  Fernando Moyer is a 84 y.o. male admitted on 01/07/2018 with pneumonia and sepsis.  Pharmacy has been consulted for vancomycin dosing.  Cefepime x 1 given in ED.   SCr 0.97, CrCl ~ 65mL/min  Plan: Vancomycin 1500mg  IV x 1, then 750mg  q 12h Monitor renal function, clinical progression, MRSA PCR Vancomycin level at steady state    Temp (24hrs), Avg:98.3 F (36.8 C), Min:98 F (36.7 C), Max:98.6 F (37 C)  Recent Labs  Lab 01/14/2018 1357  WBC 15.0*  CREATININE 0.97    Estimated Creatinine Clearance: 54.9 mL/min (by C-G formula based on SCr of 0.97 mg/dL).    No Known Allergies  Antimicrobials this admission: Vanc 10/16>> Cefepime 10/16 x 1    Microbiology results: 10/16 BCX: sent 10/16 UCx: sent   Bertis Ruddy, PharmD Clinical Pharmacist Please check AMION for all [place] numbers 01/18/2018 3:18 PM

## 2018-01-15 NOTE — Code Documentation (Signed)
Family updated as to patient's status.

## 2018-01-15 NOTE — ED Provider Notes (Signed)
[facility] EMERGENCY DEPARTMENT Provider Note   CSN: [id number] Arrival date & time: 01/09/2018  1338     History   Chief Complaint Chief Complaint  Patient presents with  . Chest Pain    HPI Fernando Moyer is a 84 y.o. male.  HPI Patient is sent from Dr. Larose Kells office with increasing shortness of breath and chest pain.  Oxygen saturation in the office is low.  He does have history of ALS and has difficulty communicating by speech.  Per family, patient has been declining over the past several weeks.  They report that he somehow indicated that he did have dark or bloody stool, reportedly patient communicates by writing.  They also noted that he coughed up blood at one point in time.  He has been having ongoing problems with epigastric pain and use of his PEG tube.  This was evaluated diagnostically by CT 9 days ago.  Was some question as to whether or not patient had aspiration pneumonia. Past Medical History:  Diagnosis Date  . AAA (abdominal aortic aneurysm) ([place]) 05/2008   PER CARDIAC CATH 3.5 X 3. 5 CM  . ALS (amyotrophic lateral sclerosis) ([place])   . Aortic stenosis 05/2008   PER CARDIAC CATH, MILD, MITRAL STENOSIS  . Benign neoplasm of colon   . Bowen's disease    right arm BX: referred to dermatology  . Carotid artery occlusion    last u/s 3-11...Marland Kitchenper vasc.surgery 2007 cath: nonobstructive CAD. mil;d MS, trivial AoS, small AAA,  . Cataract    RIGHT removed  . Chronic back pain   . COPD (chronic obstructive pulmonary disease) ([place])   . Coronary artery disease 05/2008   MILD, MEDICAL MANAGEMENT  . Diabetes mellitus ([place]) 09/07/2011   no per pt- no medications  . Dyspnea    with exertion only  . GERD with stricture    w/hx of stricture  . History of nuclear stress test    Myoview 6/18: EF 51, normal perfusion; Low Risk  . Hyperlipidemia    takes Pravastatin daily  . Mitral stenosis 05/2008   PER CARDIAC CATH  . Myocardial infarction ([place]) 2012  . Peripheral  neuropathy    right thigh; "it's been that way for years"  . Pulmonary hypertension ([place])   . PVD (peripheral vascular disease) ([place]) 12/07   per cath 12/07....iliac  . Stricture and stenosis of esophagus     Patient Active Problem List   Diagnosis Date Noted  . S/P percutaneous endoscopic gastrostomy (PEG) tube placement ([place]) 11/27/2017  . ALS (amyotrophic lateral sclerosis) ([place]) 07/17/2017  . Benign esophageal stricture 11/24/2015  . Hiatal hernia 11/24/2015  . Esophageal obstruction due to food impaction   . PCP NOTES >>>>>>>>>>>>>>>>>> 08/23/2015  . Back pain 10/19/2014  . Carotid stenosis 04/14/2014  . Dyspnea 02/01/2014  . Skin cancer 09/29/2013  . Subclavian artery stenosis ([place]) 04/08/2013  . Annual physical exam 02/25/2013  . Dysphagia 02/12/2013  . History of esophageal stricture 02/12/2013  . S/P AVR (aortic valve replacement) 11/21/2012  . PAF (paroxysmal atrial fibrillation) ([place]) 11/21/2012  . Diabetes mellitus ([place]) 09/07/2011  . Atrial flutter with rapid ventricular response ([place]) 02/22/2011  . BPH , Microscopic hematuria 01/17/2011  . Rosacea 01/17/2011  . Carotid artery disease ([place]) 06/24/2009  . DIZZINESS 04/04/2009  . Headache(784.0) 04/04/2009  . EMPHYSEMA 07/12/2008  . Mitral stenosis 06/29/2008  . CAD (coronary artery disease) 06/29/2008  . HYPERTENSION, PULMONARY 06/29/2008  . Aortic stenosis 06/29/2008  . AAA (abdominal  aortic aneurysm) without rupture ([place]) 06/29/2008  . Hyperlipidemia 04/14/2008  . Peripheral vascular disease ([place]) 04/14/2006  . GERD, Stricture per EGD 11 2014 04/14/2006    Past Surgical History:  Procedure Laterality Date  . AORTIC VALVE REPLACEMENT  08/17/2011   Procedure: AORTIC VALVE REPLACEMENT (AVR);  Surgeon: Gaye Pollack, MD;  Location: [facility];  Service: Open Heart Surgery;  Laterality: N/A;  . APPENDECTOMY    . CARDIAC CATHETERIZATION  02/2011   LAD 50, CFX OK, RCA40, EF 55%; PCWP 17, AoV  0.9 cm squared  .  ENDARTERECTOMY  08-2010   Right carotid endarterectomy    . ESOPHAGEAL DILATION    . ESOPHAGOGASTRODUODENOSCOPY N/A 10/29/2015   Procedure: ESOPHAGOGASTRODUODENOSCOPY (EGD);  Surgeon: Jerene Bears, MD;  [facility] ENDOSCOPY;  Service: Endoscopy;  Laterality: N/A;  . ESOPHAGOGASTRODUODENOSCOPY (EGD) WITH PROPOFOL N/A 11/10/2015   Procedure: ESOPHAGOGASTRODUODENOSCOPY (EGD) WITH PROPOFOL;  Surgeon: Milus Banister, MD;  Location: [facility] ENDOSCOPY;  Service: Endoscopy;  Laterality: N/A;  . ESOPHAGOGASTRODUODENOSCOPY (EGD) WITH PROPOFOL N/A 11/24/2015   Procedure: ESOPHAGOGASTRODUODENOSCOPY (EGD) WITH PROPOFOL;  Surgeon: Milus Banister, MD;  Location: [facility] ENDOSCOPY;  Service: Endoscopy;  Laterality: N/A;  dil  . eye lid surgery Bilateral ~ 07-2015  . EYE SURGERY  10/14/13   cataract surgery LEFT eye   . LARYNGOSCOPY  08/29/2016   transnasal  . LEFT AND RIGHT HEART CATHETERIZATION WITH CORONARY ANGIOGRAM N/A 02/23/2011   Procedure: LEFT AND RIGHT HEART CATHETERIZATION WITH CORONARY ANGIOGRAM;  Surgeon: [name], MD;  Location: [facility] CATH LAB;  Service: Cardiovascular;  Laterality: N/A;  . PEG TUBE PLACEMENT  10/2017  . STERNAL WIRES REMOVAL N/A 05/01/2013   Procedure: STERNAL WIRES REMOVAL;  Surgeon: Gaye Pollack, MD;  Location: [facility] OR;  Service: Thoracic;  Laterality: N/A;        Home Medications    Prior to Admission medications   Medication Sig Start Date End Date Taking? Authorizing Provider  acetaminophen (TYLENOL) 500 MG tablet Take 1,000 mg by mouth every 6 (six) hours as needed (pain). Reported on 04/21/2015    [provider]  ELIQUIS 5 MG TABS tablet TAKE 1 TABLET TWICE A DAY 08/13/17   [name], MD  ferrous sulfate (FERROUSUL) 325 (65 FE) MG tablet Take 1 tablet (325 mg total) by mouth 2 (two) times daily with a meal. Patient not taking: Reported on 01/18/2018 08/03/16   Colon Branch, MD  furosemide (LASIX) 40 MG tablet TAKE 1 TABLET DAILY 08/13/17   [name], MD    KLOR-CON M20 20 MEQ tablet TAKE 1 TABLET DAILY 08/13/17   [name], MD  omeprazole (PRILOSEC) 40 MG capsule Take 1 capsule (40 mg total) by mouth 2 (two) times daily. 12/16/17   Colon Branch, MD  polyethylene glycol [facility] / Floria Raveling) packet Take 17 g by mouth daily as needed for moderate constipation.    [provider]  pravastatin (PRAVACHOL) 40 MG tablet Take 1 tablet (40 mg total) by mouth daily. Patient not taking: Reported on 01/21/2018 06/03/17   Colon Branch, MD  tiotropium (SPIRIVA HANDIHALER) 18 MCG inhalation capsule Place 1 capsule (18 mcg total) into inhaler and inhale daily. 08/29/17   Colon Branch, MD  traMADol (ULTRAM) 50 MG tablet Take 1 tablet (50 mg total) by mouth every 12 (twelve) hours as needed for moderate pain. for pain 01/10/18   Colon Branch, MD  UNABLE TO FIND Check CMP monthly x 2 months. Fax results to dr  caress at [facility] [phone]) 07/17/17   [provider]  [facility] referral for ALS and pain management.  Dx. ALS G12.21. Last MD eval: 07/17/2017.  Referral faxed to [facility] [place] [phone]. 07/18/17   [provider]    Family History Family History  Problem Relation Age of Onset  . Heart attack Father 74       MI  . Prostate cancer Brother 41  . Colon cancer Neg Hx   . Anesthesia problems Neg Hx   . Hypotension Neg Hx   . Malignant hyperthermia Neg Hx   . Pseudochol deficiency Neg Hx   . Stomach cancer Neg Hx   . Esophageal cancer Neg Hx   . Rectal cancer Neg Hx   . Pancreatic cancer Neg Hx     Social History Social History   Tobacco Use  . Smoking status: Former Smoker    Packs/day: 2.00    Years: 30.00    Pack years: 60.00    Types: Cigarettes    Last attempt to quit: 04/03/1987    Years since quitting: 30.8  . Smokeless tobacco: Never Used  . Tobacco comment: quit smoking in 1989  Substance Use Topics  . Alcohol use: No  . Drug use: No      Allergies   Patient has no known allergies.   Review of Systems Review of Systems 5 caveat cannot obtain review of systems due to patient extremis.  Physical Exam Updated Vital Signs BP (!) 163/67   Pulse (!) 114   Temp 97.9 F (36.6 C)   Resp 12   Ht 5\' 7"  (1.702 m)   SpO2 100%   BMI 26.86 kg/m   Physical Exam  Constitutional:  Has significant increased work of breathing.  He is anxious in appearance.  Distress.  HENT:  Head: Normocephalic and atraumatic.  Mouth/Throat: Oropharynx is clear and moist.  Eyes: EOM are normal.  Cardiovascular:  Tachycardia.  A lot of respiratory noise cannot determine if rub murmur gallop.  Pulmonary/Chest:  Respiratory distress.  Breath sounds are diminished but present bilaterally with extensive wheezing and crackle.  Abdominal: Soft. He exhibits no distension. There is no guarding.  PEG tube in place.  No purulent drainage or rash.  Musculoskeletal:  1+ pitting edema around the ankles.  Skin condition of the lower legs is good.  Neurological:  Patient is alert but in distress.  Due to lack of verbal communication difficult to assess for mental status.  Has ALS at baseline.  Limited in physical activities.  He is moving about sometimes reaching and moving his feet.  Skin: Skin is warm and dry. There is pallor.  Psychiatric:  Severely anxious     ED Treatments / Results  Labs (all labs ordered are listed, but only abnormal results are displayed) Labs Reviewed  BASIC METABOLIC PANEL - Abnormal; Notable for the following components:      Result Value   Glucose, Bld 141 (*)    All other components within normal limits  CBC - Abnormal; Notable for the following components:   WBC 15.0 (*)    RBC 2.80 (*)    Hemoglobin 6.5 (*)    HCT 22.9 (*)    MCH 23.2 (*)    MCHC 28.4 (*)    RDW 16.5 (*)    All other components within normal limits  TROPONIN I - Abnormal; Notable for the following components:   Troponin I 0.11 (*)  All other components within normal limits  I-STAT CG4 LACTIC ACID, ED - Abnormal; Notable for the following components:   Lactic Acid, Venous 8.80 (*)    All other components within normal limits  CULTURE, BLOOD (ROUTINE X 2)  CULTURE, BLOOD (ROUTINE X 2)  URINE CULTURE  URINALYSIS, ROUTINE W REFLEX MICROSCOPIC  I-STAT CG4 LACTIC ACID, ED  TYPE AND SCREEN  PREPARE RBC (CROSSMATCH)    EKG EKG Interpretation  Date/Time:  Wednesday January 15 2018 13:52:29 EDT Ventricular Rate:  123 PR Interval:    QRS Duration: 123 QT Interval:  346 QTC Calculation: 495 R Axis:   -38 Text Interpretation:  Sinus tachycardia Right bundle branch block LVH with IVCD and secondary repol abnrm Borderline prolonged QT interval old rbbb, increased ST depression Confirmed by Charlesetta Shanks [phone]) on 01/13/2018 2:15:46 PM   Radiology Dg Chest Portable 1 View  Result Date: 01/11/2018 CLINICAL DATA:  Intubation. EXAM: PORTABLE CHEST 1 VIEW COMPARISON:  01/25/2018 at 1420 hours FINDINGS: An endotracheal tube has been placed and terminates 3.5 cm above the carina. There is diffuse peribronchial thickening and accentuation of the interstitial markings with patchy airspace opacity in the right greater than left lung bases, unchanged. The interstitial markings are mildly more prominent than on a 07/04/2016 examination. IMPRESSION: 1. Endotracheal tube as above. 2. COPD with increased diffuse interstitial opacity and patchy bibasilar opacities which may reflect superimposed edema or pneumonia and atelectasis. Electronically Signed   By: Logan Bores M.D.   On: 01/16/2018 15:28   Dg Chest Port 1 View  Result Date: 01/23/2018 CLINICAL DATA:  Chest pain, shortness of breath. EXAM: PORTABLE CHEST 1 VIEW COMPARISON:  Radiographs of July 04, 2016. FINDINGS: Stable cardiomediastinal silhouette. Minimal left basilar subsegmental atelectasis is noted. Mild right basilar opacity is noted concerning for pneumonia or  atelectasis. Small pleural effusion cannot be excluded. No definite pneumothorax is noted. Bony thorax is unremarkable. IMPRESSION: Minimal left basilar subsegmental atelectasis. Mild right basilar atelectasis or pneumonia. Followup PA and lateral chest X-ray is recommended in 3-4 weeks following trial of antibiotic therapy to ensure resolution and exclude underlying malignancy. Electronically Signed   By: Marijo Conception, M.D.   On: 01/23/2018 14:49    Procedures Procedure Name: Intubation Date/Time: 01/01/2018 4:14 PM Performed by: Charlesetta Shanks, MD Pre-anesthesia Checklist: Patient identified, Patient being monitored, Emergency Drugs available and Suction available Oxygen Delivery Method: Non-rebreather mask Preoxygenation: Pre-oxygenation with 100% oxygen Induction Type: Rapid sequence Ventilation: Mask ventilation without difficulty Laryngoscope Size: Glidescope and 4 Grade View: Grade I Tube size: 7.5 mm Number of attempts: 1 Airway Equipment and Method: Video-laryngoscopy Placement Confirmation: ETT inserted through vocal cords under direct vision,  CO2 detector and Breath sounds checked- equal and bilateral Comments: Patient did not seem to initially respond to rocuronium.  Was given etomidate and rocuronium but did not become paralyzed.  Possibly, IV used was not flushing appropriately.  Alternate IV site used and patient was given 4 mg of Versed which immediately sedated him, followed by an additional dose of rocuronium with positive paralysis and uncomplicated intubation once patient was adequately paralyzed.      (including critical care time) CRITICAL CARE Performed by: Charlesetta Shanks   Total critical care time: 50 minutes  Critical care time was exclusive of separately billable procedures and treating other patients.  Critical care was necessary to treat or prevent imminent or life-threatening deterioration.  Critical care was time spent personally by me on the  following activities: development of treatment  plan with patient and/or surrogate as well as nursing, discussions with consultants, evaluation of patient's response to treatment, examination of patient, obtaining history from patient or surrogate, ordering and performing treatments and interventions, ordering and review of laboratory studies, ordering and review of radiographic studies, pulse oximetry and re-evaluation of patient's condition.  Angiocath insertion Performed by: Charlesetta Shanks  Consent: Verbal consent obtained. Risks and benefits: risks, benefits and alternatives were discussed Time out: Immediately prior to procedure a "time out" was called to verify the correct patient, procedure, equipment, support staff and site/side marked as required.  Preparation: Patient was prepped and draped in the usual sterile fashion.  Vein Location:left basilic  Ultrasound Guided:yes  Gauge: 18  Normal blood return and flush without difficulty Patient tolerance: Patient tolerated the procedure well with no immediate complications.   Medications Ordered in ED Medications  rocuronium (ZEMURON) injection 77.8 mg (77.8 mg Intravenous Not Given 01/28/2018 1430)  etomidate (AMIDATE) injection 23.34 mg (23.34 mg Intravenous Not Given 01/26/2018 1430)  propofol (DIPRIVAN) 1000 MG/100ML infusion (has no administration in time range)  sodium chloride 0.9 % bolus 2,334 mL (has no administration in time range)  ceFEPIme (MAXIPIME) 1 g injection (has no administration in time range)  vancomycin (VANCOCIN) IVPB 750 mg/150 ml premix (has no administration in time range)  vancomycin (VANCOCIN) 1,500 mg in sodium chloride 0.9 % 500 mL IVPB (has no administration in time range)  0.9 %  sodium chloride infusion (Manually program via Guardrails IV Fluids) (has no administration in time range)  0.9 %  sodium chloride infusion (has no administration in time range)  0.9 %  sodium chloride infusion (Manually program  via Guardrails IV Fluids) (has no administration in time range)  0.9 %  sodium chloride infusion (has no administration in time range)  vancomycin (VANCOCIN) 1000 MG powder (has no administration in time range)  vancomycin (VANCOCIN) 500 MG powder (has no administration in time range)  rocuronium (ZEMURON) injection (80 mg Intravenous Given 01/09/2018 1420)  etomidate (AMIDATE) injection (20 mg Intravenous Given 12/31/2017 1421)  0.9 %  sodium chloride infusion (1,000 mLs Intravenous New Bag/Given 01/14/2018 1438)  midazolam (VERSED) 5 MG/5ML injection ( Intravenous Canceled Entry 01/12/2018 1500)  rocuronium (ZEMURON) injection (80 mg Intravenous Given 01/19/2018 1455)  propofol (DIPRIVAN) 1000 MG/100ML infusion (5 mcg/kg/min  77.8 kg Intravenous Transfusing/Transfer 01/17/2018 1603)  ceFEPIme (MAXIPIME) 1 g in sodium chloride 0.9 % 100 mL IVPB (0 g Intravenous Paused 01/19/2018 1547)     Initial Impression / Assessment and Plan / ED Course  I have reviewed the triage vital signs and the nursing notes.  Pertinent labs & imaging results that were available during my care of the patient were reviewed by me and considered in my medical decision making (see chart for details).    Patient presented with dyspnea from outpatient office in the same building.  Has multiple comorbid and severe illness.  ALS which has been declining per family report.  Shortness of breath which may be multifactorial.  Patient was found to be significantly anemic at 6.5.  This is a significant drop since last available comparison 5 months ago.  Patient however had stable blood pressure thus there may be some incremental and chronic nature to this.  At this point however I believe he was experiencing demand ischemia as troponin was mildly elevated.  EKG was consistent with old bundle branch block but slightly more ST depression suggesting ischemia.  Once intubated, patient had stable blood pressure and heart rate  improved to the low  100s.  Sepsis protocol initiated.  1 unit of blood transfused at this facility.  (Only 2 units are available).  Reviewed this with Dr. Nelda Marseille the intensivist, and he advised to use the 1 unit and transfer the patient is is [facility] and he would resume blood transfusion.  He reports patient does not wish prolonged ventilatory support but if there is potential for improvement they do wish advanced care.  At this time, with the patient having reasonably normal blood pressures and responding well to supplemental oxygen with treatable conditions such as potential pneumonia and anemia, I feel there is potential for stabilization however patient's condition remains very critical and I have informed family that due to his underlying ALS he may nonetheless have significant difficulty coming back off the ventilator.  Family members were able to verbally interact with the patient prior to intubation.  Final Clinical Impressions(s) / ED Diagnoses   Final diagnoses:  Acute respiratory failure with hypoxia ([place])  Symptomatic anemia  Severe comorbid illness    ED Discharge Orders    None       Charlesetta Shanks, MD 01/14/2018 1621

## 2018-01-15 NOTE — Assessment & Plan Note (Signed)
Aspiration pneumonia?. The patient is 84 years old, with diabetes, anticoagulated,s/p a PEG placement few weeks ago, presents with hemoptysis yesterday, ongoing chest pain and abdominal pain. On exam, he is quite congested in the chest, has some decreased breath sounds at the right base. I think he needs further evaluation, I discussed the case with the ER physician who agreed to assess the patient.  Despite the low oxygen, he seems to be stable at this point.

## 2018-01-15 NOTE — Progress Notes (Signed)
Subjective:    Patient ID: Fernando Moyer, male    DOB: 01-02-1934, 84 y.o.   MRN: [id number]  DOS:  01/02/2018 Type of visit - description : acute, here with his granddaughter Elmyra Ricks Interval history: Since the last visit, ALS is worsen,  he is follow-up at [facility], s/p PEG 11/26/17 Due to weight loss and abdominal pain a CT was pursued 01/06/2018: No acute findings, + gallbladder stones, enlargement of moderate hiatal hernia. Interstitial thickening and ground gram groundglass opacity at the right lung base.  New.  Infection versus aspiration.  The reason they are here is because he is having distal-anterior chest pain and epigastric abdominal pain, gradually worse.  That is the reason why CT was pursued 01/06/2018 at [facility].  Also, he is getting weaker. Had a episode of hemoptysis yesterday.  He is afraid of using the PEG tube so is using it inconsistently, still drinking fluids such as Ensure.  Review of Systems Communication with the patient is very difficult because he is essentially nonverbal, communicates through writting. His temperature was checked yesterday and he had no fever. Denies nausea, vomiting, diarrhea. Blood in the stools?. He reports shortness of breath. Elmyra Ricks, the granddaughter, states that he is gradually deteriorating and has developing lower extremity edema.   Past Medical History:  Diagnosis Date  . AAA (abdominal aortic aneurysm) ([place]) 05/2008   PER CARDIAC CATH 3.5 X 3. 5 CM  . ALS (amyotrophic lateral sclerosis) ([place])   . Aortic stenosis 05/2008   PER CARDIAC CATH, MILD, MITRAL STENOSIS  . Benign neoplasm of colon   . Bowen's disease    right arm BX: referred to dermatology  . Carotid artery occlusion    last u/s 3-11...Marland Kitchenper vasc.surgery 2007 cath: nonobstructive CAD. mil;d MS, trivial AoS, small AAA,  . Cataract    RIGHT removed  . Chronic back pain   . COPD (chronic obstructive pulmonary disease) ([place])   .  Coronary artery disease 05/2008   MILD, MEDICAL MANAGEMENT  . Diabetes mellitus ([place]) 09/07/2011   no per pt- no medications  . Dyspnea    with exertion only  . GERD with stricture    w/hx of stricture  . History of nuclear stress test    Myoview 6/18: EF 51, normal perfusion; Low Risk  . Hyperlipidemia    takes Pravastatin daily  . Mitral stenosis 05/2008   PER CARDIAC CATH  . Myocardial infarction ([place]) 2012  . Peripheral neuropathy    right thigh; "it's been that way for years"  . Pulmonary hypertension ([place])   . PVD (peripheral vascular disease) ([place]) 12/07   per cath 12/07....iliac  . Stricture and stenosis of esophagus     Past Surgical History:  Procedure Laterality Date  . AORTIC VALVE REPLACEMENT  08/17/2011   Procedure: AORTIC VALVE REPLACEMENT (AVR);  Surgeon: Gaye Pollack, MD;  Location: [place];  Service: Open Heart Surgery;  Laterality: N/A;  . APPENDECTOMY    . CARDIAC CATHETERIZATION  02/2011   LAD 50, CFX OK, RCA40, EF 55%; PCWP 17, AoV  0.9 cm squared  . ENDARTERECTOMY  08-2010   Right carotid endarterectomy    . ESOPHAGEAL DILATION    . ESOPHAGOGASTRODUODENOSCOPY N/A 10/29/2015   Procedure: ESOPHAGOGASTRODUODENOSCOPY (EGD);  Surgeon: Jerene Bears, MD;  Location: [facility] ENDOSCOPY;  Service: Endoscopy;  Laterality: N/A;  . ESOPHAGOGASTRODUODENOSCOPY (EGD) WITH PROPOFOL N/A 11/10/2015   Procedure: ESOPHAGOGASTRODUODENOSCOPY (EGD) WITH PROPOFOL;  Surgeon: Milus Banister, MD;  Location:  [facility] ENDOSCOPY;  Service: Endoscopy;  Laterality: N/A;  . ESOPHAGOGASTRODUODENOSCOPY (EGD) WITH PROPOFOL N/A 11/24/2015   Procedure: ESOPHAGOGASTRODUODENOSCOPY (EGD) WITH PROPOFOL;  Surgeon: Milus Banister, MD;  Location: [facility] ENDOSCOPY;  Service: Endoscopy;  Laterality: N/A;  dil  . eye lid surgery Bilateral ~ 07-2015  . EYE SURGERY  10/14/13   cataract surgery LEFT eye   . LARYNGOSCOPY  08/29/2016   transnasal  . LEFT AND RIGHT HEART CATHETERIZATION WITH CORONARY ANGIOGRAM N/A 02/23/2011    Procedure: LEFT AND RIGHT HEART CATHETERIZATION WITH CORONARY ANGIOGRAM;  Surgeon: [name], MD;  Location: [facility] CATH LAB;  Service: Cardiovascular;  Laterality: N/A;  . PEG TUBE PLACEMENT  10/2017  . STERNAL WIRES REMOVAL N/A 05/01/2013   Procedure: STERNAL WIRES REMOVAL;  Surgeon: Gaye Pollack, MD;  Location: [facility] OR;  Service: Thoracic;  Laterality: N/A;    Social History   Socioeconomic History  . Marital status: Widowed    Spouse name: Not on file  . Number of children: 2  . Years of education: Not on file  . Highest education level: Not on file  Occupational History  . 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 Financial resource strain: Not on file  . Food insecurity:    Worry: Not on file    Inability: Not on file  . Transportation needs:    Medical: Not on file    Non-medical: Not on file  Tobacco Use  . Smoking status: Former Smoker    Packs/day: 2.00    Years: 30.00    Pack years: 60.00    Types: Cigarettes    Last attempt to quit: 04/03/1987    Years since quitting: 30.8  . Smokeless tobacco: Never Used  . Tobacco comment: quit smoking in 1989  Substance and Sexual Activity  . Alcohol use: No  . Drug use: No  . Sexual activity: Not on file  Lifestyle  . Physical activity:    Days per week: Not on file    Minutes per session: Not on file  . Stress: Not on file  Relationships  . Social connections:    Talks on phone: Not on file    Gets together: Not on file    Attends religious service: Not on file    Active member of club or organization: Not on file    Attends meetings of clubs or organizations: Not on file    Relationship status: Not on file  . Intimate partner violence:    Fear of current or ex partner: Not on file    Emotionally abused: Not on file    Physically abused: Not on file    Forced sexual activity: Not on file  Other Topics Concern  . Not on  file  Social History Narrative   LIVES BY HIMSELF    HAS 2 GROWN SONS, 5 G-KIDS    Lenna Sciara is her granddaughter, she is a Art therapist for the school system, usually comes with him for his appointments             Allergies as of 01/25/2018   No Known Allergies     Medication List        Accurate as of 01/08/2018  1:44 PM. Always use your most recent med list.          acetaminophen 500 MG tablet Commonly known as:  TYLENOL Take 1,000  mg by mouth every 6 (six) hours as needed (pain). Reported on 04/21/2015   ELIQUIS 5 MG Tabs tablet Generic drug:  apixaban TAKE 1 TABLET TWICE A DAY   ferrous sulfate 325 (65 FE) MG tablet Take 1 tablet (325 mg total) by mouth 2 (two) times daily with a meal.   furosemide 40 MG tablet Commonly known as:  LASIX TAKE 1 TABLET DAILY   KLOR-CON M20 20 MEQ tablet Generic drug:  potassium chloride SA TAKE 1 TABLET DAILY   omeprazole 40 MG capsule Commonly known as:  PRILOSEC Take 1 capsule (40 mg total) by mouth 2 (two) times daily.   polyethylene glycol packet Commonly known as:  MIRALAX / GLYCOLAX Take 17 g by mouth 2 (two) times daily.   pravastatin 40 MG tablet Commonly known as:  PRAVACHOL Take 1 tablet (40 mg total) by mouth daily.   tiotropium 18 MCG inhalation capsule Commonly known as:  SPIRIVA Place 1 capsule (18 mcg total) into inhaler and inhale daily.   traMADol 50 MG tablet Commonly known as:  ULTRAM Take 1 tablet (50 mg total) by mouth every 12 (twelve) hours as needed for moderate pain. for pain   UNABLE TO FIND Check CMP monthly x 2 months. Fax results to dr caress at [place] [phone])   [place] referral for ALS and pain management.  Dx. ALS G12.21. Last MD eval: 07/17/2017.  Referral [facility] to [facility] [place] [phone].          Objective:   Physical Exam BP 136/78 (BP Location: Right Arm, Patient Position: Sitting, Cuff Size: Small)    Pulse 76   Temp 98 F (36.7 C) (Oral)   Resp 16   Ht 5\' 7"  (1.702 m)   Wt 171 lb 8 oz (77.8 kg)   SpO2 (!) 85%   BMI 26.86 kg/m   General:   CT in a wheelchair, in no distress, looked pale, has lost weight since the last time I saw him. HEENT:  Normocephalic . Face symmetric, atraumatic Lungs:  Rhonchi throughout, decreased breath sounds mostly at the right base.  A lot of large airway congestion.  no accessory muscle use. Heart: Regular?  + Murmur .  Trace pretibial edema bilaterally  Abdomen:  Not distended, soft, not distended, mildly tender throughout. Skin: Not pale. Not jaundice Neurologic:  Alert, cooperative, he is nonverbal but seems oriented x3.  Gait not tested. Psych--  Behavior appropriate. No anxious or depressed appearing.     Assessment & Plan:   Assessment DM  w/ neuropathy (feet burning, nl exam) COPD Hyperlipidemia GERD with stricture, EGD x 3 ~ July 2017 . EGD again 11/02/16:  Hiatal hernia.  Peptic appearing GE junction stricture, dilated to 76mm today with TTS balloon. Mild pan-gastritis ALS dx 07-2017 CV: --CAD,  Last myoview 2014 --Carotid artery disease-- R CEA 2012 --A. Fib-- paroxysmal - eliquis  --Aortic valve replacement (bio) --PVD: AAA, iliAortic valve, subclavian artery stenosis  --EL:FYBOF open heart surgery 2013. CT chest 2014 no PE, gallbladder stones. --accurate BP would be in the right arm due to history of PVD.  MSK-- back pain, sees Dr Nelva Bush, tramadol per PCP Skin cancer Hoarseness: CT chest 2018 (-), saw ENT 2018, negative flexible laryngoscopy, Rx voice therapy.  Also saw neurology   PLAN  Aspiration pneumonia?. The patient is 84 years old, with diabetes, anticoagulated,s/p a PEG placement few weeks ago, presents with hemoptysis yesterday, ongoing chest pain and abdominal  pain. On exam, he is quite congested in the chest, has some decreased breath sounds at the right base. I think he needs further evaluation, I discussed the  case with the ER physician who agreed to assess the patient.  Despite the low oxygen, he seems to be stable at this point.

## 2018-01-15 NOTE — ED Notes (Signed)
Pt is writhing in pain. Waiting for MD to see pt and possibly order pain meds prior to xray.

## 2018-01-15 NOTE — ED Notes (Signed)
Pt family called out stating something wasn't right. Pt was incontinent of urine, pale, diaphoretic, noticeably SOB and extremely restless. Pt O2 sats dropped into the 80's. Pt placed on O2 via NRB. RT and EDP to bedside and pt moved into rm 14.

## 2018-01-15 NOTE — Code Documentation (Signed)
Pt placed on o2 15lpm via NRB

## 2018-01-15 NOTE — Procedures (Signed)
Bronchoscopy Procedure Note Fernando Moyer [id number] September 23, 1933  Procedure: Bronchoscopy Indications: Diagnostic evaluation of the airways, Obtain specimens for culture and/or other diagnostic studies and Remove secretions  Procedure Details Consent: Risks of procedure as well as the alternatives and risks of each were explained to the (patient/caregiver).  Consent for procedure obtained. Time Out: Verified patient identification, verified procedure, site/side was marked, verified correct patient position, special equipment/implants available, medications/allergies/relevent history reviewed, required imaging and test results available.  Performed  In preparation for procedure, patient was given 100% FiO2 and bronchoscope lubricated. Sedation: Propofol  Airway entered and the following bronchi were examined: RUL, RML, RLL, LUL, LLL and Bronchi.   BAL from RLL Purulent secretions from the RLL and LLL Bronchoscope removed.  , Patient placed back on 100% FiO2 at conclusion of procedure.    Evaluation Hemodynamic Status: BP stable throughout; O2 sats: stable throughout Patient's Current Condition: stable Specimens:  Sent purulent fluid Complications: No apparent complications Patient did tolerate procedure well.   YACOUB,WESAM 01/13/2018

## 2018-01-15 NOTE — H&P (Signed)
NAME:  Fernando Moyer, MRN:  [id number], DOB:  11-Apr-1933, LOS: 0 ADMISSION DATE:  01/26/2018, CONSULTATION DATE:  01/04/2018 REFERRING MD:  [facility], CHIEF COMPLAINT:  GI bleeding and SOB  Brief History   84 year old with history of ALS that has a PEG in place and frequent dysphagia who has been declining for the past few weeks.  Patient present to his primary MD's office where he was found to be desaturating on RA.  Patient was sent to the ED.  Of note, patient had a concern with epigastric pain when using PEG tube and a CT was done 9 days PTA there was a concern for aspiration.  Patient was [facility] in [facility].  Patient was severely SOB and was intubated in the ED for hypoxemic respiratory failure.  Transferred to [facility] for further management.  Past Medical History  ALS  Significant Hospital Events   10/16 intubated for airway protection  Consults: date of consult/date signed off & final recs:  GI  Procedures (surgical and bedside):  Bronchoscopy 10/16>>> ETT 10/16>>> PIV 10/16>>>  Significant Diagnostic Tests:  Bronch with no evidence of blood but purulent material in both lower lobes  Micro Data:  Blood 10/16>>> Urine 10/16>>> Sputum 10/16>>>  Antimicrobials:  Vanc 10/16>>> Zosyn 10/16>>>   Subjective:  Sedate and unresponsive post intubation  Objective   Blood pressure (!) 163/67, pulse (!) 114, temperature 97.9 F (36.6 C), resp. rate 12, height 5\' 7"  (1.702 m), SpO2 100 %.    Vent Mode: PRVC FiO2 (%):  [60 %-100 %] 60 % Set Rate:  [12 bmp] 12 bmp Vt Set:  [600 mL] 600 mL PEEP:  [5 cmH20] 5 cmH20 Plateau Pressure:  [8 cmH20] 8 cmH20   Intake/Output Summary (Last 24 hours) at 01/12/2018 1718 Last data filed at 01/09/2018 1715 Gross per 24 hour  Intake 525.17 ml  Output -  Net 525.17 ml   There were no vitals filed for this visit.  Examination: General: Chronically ill appearing male, sedate HENT: [place]/AT, PERRL, EOM-I and MMM Lungs:  Coarse BS diffusely Cardiovascular: RRR, Nl S1/S2 and -M/R/G Abdomen: Soft, NT, ND and +BS Extremities: -edema and -tenderness Neuro: Sedate, withdraws some to pain but very weakly Skin: Intact  Resolved Hospital Problem list   N/A  Assessment & Plan:  84 year old with advance ALS who presents to the hospital with aspiration pneumonia, respiratory failure and AMS.    VDRF:  - Full vent support  - ABG and CXR  - Adjust vent for ABG  Aspiration Pneumonia:  - Pan culture  - Vanc/zosyn  - PCT  GI Bleed:  - H&H q6  - Transfuse total of 2 units  - T&S  ALS: supportive care  Renal:  - BMET in AM  - Replace electrolytes as indicated  - NS at 50 ml/hr  Abdominal pain: ?gastritis  - GI consult  - BID protonix  - NPO until cleared by GI  GOC: LCB with no CPR/cardiversion or trach, short term support only.  Disposition / Summary of Today's Plan 01/06/2018   See above   Labs   CBC: Recent Labs  Lab 01/14/2018 1357  WBC 15.0*  HGB 6.5*  HCT 22.9*  MCV 81.8  PLT 935    Basic Metabolic Panel: Recent Labs  Lab 01/26/2018 1357  NA 135  K 4.1  CL 99  CO2 25  GLUCOSE 141*  BUN 19  CREATININE 0.97  CALCIUM 9.2  GFR: Estimated Creatinine Clearance: 54.9 mL/min (by C-G formula based on SCr of 0.97 mg/dL). Recent Labs  Lab 01/24/2018 1357 01/09/2018 1444  WBC 15.0*  --   LATICACIDVEN  --  8.80*    Liver Function Tests: No results for input(s): AST, ALT, ALKPHOS, BILITOT, PROT, ALBUMIN in the last 168 hours. No results for input(s): LIPASE, AMYLASE in the last 168 hours. No results for input(s): AMMONIA in the last 168 hours.  ABG    Component Value Date/Time   PHART 7.351 01/06/2018 1609   PCO2ART 44.6 01/16/2018 1609   PO2ART 316.0 (H) 01/11/2018 1609   HCO3 24.8 01/16/2018 1609   TCO2 26 01/05/2018 1609   ACIDBASEDEF 1.0 01/27/2018 1609   O2SAT 100.0 01/10/2018 1609     Coagulation Profile: No results for input(s): INR, PROTIME in the last 168  hours.  Cardiac Enzymes: Recent Labs  Lab 01/27/2018 1357  TROPONINI 0.11*    HbA1C: Hgb A1c MFr Bld  Date/Time Value Ref Range Status  08/13/2017 02:59 PM 6.3 4.6 - 6.5 % Final    Comment:    Glycemic Control Guidelines for People with Diabetes:Non Diabetic:  <6%Goal of Therapy: <7%Additional Action Suggested:  >8%   08/03/2016 12:15 PM 5.8 4.6 - 6.5 % Final    Comment:    Glycemic Control Guidelines for People with Diabetes:Non Diabetic:  <6%Goal of Therapy: <7%Additional Action Suggested:  >8%     CBG: No results for input(s): GLUCAP in the last 168 hours.  Admitting History of Present Illness.     Review of Systems:   Unattainable, sedated and intubated  Past Medical History  He,  has a past medical history of AAA (abdominal aortic aneurysm) ([place]) (05/2008), ALS (amyotrophic lateral sclerosis) ([place]), Aortic stenosis (05/2008), Benign neoplasm of colon, Bowen's disease, Carotid artery occlusion, Cataract, Chronic back pain, COPD (chronic obstructive pulmonary disease) ([place]), Coronary artery disease (05/2008), Diabetes mellitus ([place]) (09/07/2011), Dyspnea, GERD with stricture, History of nuclear stress test, Hyperlipidemia, Mitral stenosis (05/2008), Myocardial infarction ([place]) (2012), Peripheral neuropathy, Pulmonary hypertension ([place]), PVD (peripheral vascular disease) ([place]) (12/07), and Stricture and stenosis of esophagus.   Surgical History    Past Surgical History:  Procedure Laterality Date  . AORTIC VALVE REPLACEMENT  08/17/2011   Procedure: AORTIC VALVE REPLACEMENT (AVR);  Surgeon: Gaye Pollack, MD;  Location: [place];  Service: Open Heart Surgery;  Laterality: N/A;  . APPENDECTOMY    . CARDIAC CATHETERIZATION  02/2011   LAD 50, CFX OK, RCA40, EF 55%; PCWP 17, AoV  0.9 cm squared  . ENDARTERECTOMY  08-2010   Right carotid endarterectomy    . ESOPHAGEAL DILATION    . ESOPHAGOGASTRODUODENOSCOPY N/A 10/29/2015   Procedure: ESOPHAGOGASTRODUODENOSCOPY (EGD);  Surgeon: Jerene Bears, MD;  Location: [facility] ENDOSCOPY;  Service: Endoscopy;  Laterality: N/A;  . ESOPHAGOGASTRODUODENOSCOPY (EGD) WITH PROPOFOL N/A 11/10/2015   Procedure: ESOPHAGOGASTRODUODENOSCOPY (EGD) WITH PROPOFOL;  Surgeon: Milus Banister, MD;  [facility] ENDOSCOPY;  Service: Endoscopy;  Laterality: N/A;  . ESOPHAGOGASTRODUODENOSCOPY (EGD) WITH PROPOFOL N/A 11/24/2015   Procedure: ESOPHAGOGASTRODUODENOSCOPY (EGD) WITH PROPOFOL;  Surgeon: Milus Banister, MD;  [facility] ENDOSCOPY;  Service: Endoscopy;  Laterality: N/A;  dil  . eye lid surgery Bilateral ~ 07-2015  . EYE SURGERY  10/14/13   cataract surgery LEFT eye   . LARYNGOSCOPY  08/29/2016   transnasal  . LEFT AND RIGHT HEART CATHETERIZATION WITH CORONARY ANGIOGRAM N/A 02/23/2011   Procedure: LEFT AND RIGHT HEART CATHETERIZATION WITH CORONARY ANGIOGRAM;  Surgeon: Legrand Como  Burt Knack, MD;  Location: [facility] CATH LAB;  Service: Cardiovascular;  Laterality: N/A;  . PEG TUBE PLACEMENT  10/2017  . STERNAL WIRES REMOVAL N/A 05/01/2013   Procedure: STERNAL WIRES REMOVAL;  Surgeon: Gaye Pollack, MD;  [facility] OR;  Service: Thoracic;  Laterality: N/A;     Social History   Social History   Socioeconomic History  . Marital status: Widowed    Spouse name: Not on file  . Number of children: 2  . Years of education: Not on file  . Highest education level: Not on file  Occupational History  . 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 Financial resource strain: Not on file  . Food insecurity:    Worry: Not on file    Inability: Not on file  . Transportation needs:    Medical: Not on file    Non-medical: Not on file  Tobacco Use  . Smoking status: Former Smoker    Packs/day: 2.00    Years: 30.00    Pack years: 60.00    Types: Cigarettes    Last attempt to quit: 04/03/1987    Years since quitting: 30.8  . Smokeless tobacco: Never Used  . Tobacco comment: quit  smoking in 1989  Substance and Sexual Activity  . Alcohol use: No  . Drug use: No  . Sexual activity: Not on file  Lifestyle  . Physical activity:    Days per week: Not on file    Minutes per session: Not on file  . Stress: Not on file  Relationships  . Social connections:    Talks on phone: Not on file    Gets together: Not on file    Attends religious service: Not on file    Active member of club or organization: Not on file    Attends meetings of clubs or organizations: Not on file    Relationship status: Not on file  . Intimate partner violence:    Fear of current or ex partner: Not on file    Emotionally abused: Not on file    Physically abused: Not on file    Forced sexual activity: Not on file  Other Topics Concern  . Not on file  Social History Narrative   LIVES BY HIMSELF    HAS 2 GROWN SONS, 5 G-KIDS    Melissa is her granddaughter, she is a Art therapist for the school system, usually comes with him for his appointments         ,  reports that he quit smoking about 30 years ago. His smoking use included cigarettes. He has a 60.00 pack-year smoking history. He has never used smokeless tobacco. He reports that he does not drink alcohol or use drugs.   Family History   His family history includes Heart attack (age of onset: 42) in his father; Prostate cancer (age of onset: 68) in his brother. There is no history of Colon cancer, Anesthesia problems, Hypotension, Malignant hyperthermia, Pseudochol deficiency, Stomach cancer, Esophageal cancer, Rectal cancer, or Pancreatic cancer.   Allergies No Known Allergies   Home Medications  Prior to Admission medications   Medication Sig Start Date End Date Taking? Authorizing Provider  acetaminophen (TYLENOL) 500 MG tablet Take 1,000 mg by mouth every 6 (six) hours as needed (pain). Reported on 04/21/2015    [provider]  ELIQUIS 5 MG TABS tablet TAKE 1  TABLET TWICE A DAY 08/13/17   [name], MD  ferrous  sulfate (FERROUSUL) 325 (65 FE) MG tablet Take 1 tablet (325 mg total) by mouth 2 (two) times daily with a meal. Patient not taking: Reported on 01/26/2018 08/03/16   Colon Branch, MD  furosemide (LASIX) 40 MG tablet TAKE 1 TABLET DAILY 08/13/17   [name], MD  KLOR-CON M20 20 MEQ tablet TAKE 1 TABLET DAILY 08/13/17   [name], MD  omeprazole (PRILOSEC) 40 MG capsule Take 1 capsule (40 mg total) by mouth 2 (two) times daily. 12/16/17   Colon Branch, MD  polyethylene glycol [facility] / Floria Raveling) packet Take 17 g by mouth daily as needed for moderate constipation.    [provider]  pravastatin (PRAVACHOL) 40 MG tablet Take 1 tablet (40 mg total) by mouth daily. Patient not taking: Reported on 01/26/2018 06/03/17   Colon Branch, MD  tiotropium (SPIRIVA HANDIHALER) 18 MCG inhalation capsule Place 1 capsule (18 mcg total) into inhaler and inhale daily. 08/29/17   Colon Branch, MD  traMADol (ULTRAM) 50 MG tablet Take 1 tablet (50 mg total) by mouth every 12 (twelve) hours as needed for moderate pain. for pain 01/10/18   Colon Branch, MD  UNABLE TO FIND Check CMP monthly x 2 months. Fax results to dr caress at [place] [phone]) 07/17/17   [provider]  [place] referral for ALS and pain management.  Dx. ALS G12.21. Last MD eval: 07/17/2017.  [facility]erral [facility]ed to [facility] [phone]. 07/18/17   [provider]    The patient is critically ill with multiple organ systems failure and requires high complexity decision making for assessment and support, frequent evaluation and titration of therapies, application of advanced monitoring technologies and extensive interpretation of multiple databases.   Critical Care Time devoted to patient care services described in this note is  90  Minutes. This time reflects time of care of this signee Dr Jennet Maduro. This critical care time does not reflect procedure time, or teaching  time or supervisory time of PA/NP/Med student/Med Resident etc but could involve care discussion time.  Rush Farmer, M.D. [facility] Pulmonary/Critical Care Medicine. Pager: [phone]. After hours pager: [phone].

## 2018-01-15 NOTE — Patient Instructions (Signed)
Please go to the ER downstairs 

## 2018-01-15 NOTE — ED Notes (Signed)
ED Provider at bedside. 

## 2018-01-15 NOTE — ED Provider Notes (Signed)
IO LINE INSERTION Date/Time: 01/30/2018 3:01 PM Performed by: Volanda Napoleon, PA-C Authorized by: Volanda Napoleon, PA-C   Consent:    Consent obtained:  Emergent situation   Risks discussed:  Bleeding and infection Pre-procedure details:    Site preparation:  Chlorhexidine Procedure details:    Insertion site:  L proximal tibia   Insertion device:  Drill device   Insertion: Needle was inserted through the bony cortex     Number of attempts:  1   Insertion confirmation:  Easy infusion of fluids Post-procedure details:    Secured with:  Protective shield and tape   Patient tolerance of procedure:  Tolerated well, no immediate complications      Volanda Napoleon, PA-C 01/10/2018 [place], MD 01/24/18 [phone]

## 2018-01-15 NOTE — ED Triage Notes (Signed)
Per granddaughter-pt nonverbal due to ALS-pt with CP, SOB, coughing up blood-bilat LE swelling-pt sent from PCP in building via w/c-NAD

## 2018-01-16 ENCOUNTER — Encounter (HOSPITAL_COMMUNITY): Payer: Self-pay | Admitting: Internal Medicine

## 2018-01-16 ENCOUNTER — Encounter (HOSPITAL_COMMUNITY): Admission: EM | Disposition: E | Payer: Self-pay | Source: Home / Self Care | Attending: Pulmonary Disease

## 2018-01-16 ENCOUNTER — Inpatient Hospital Stay (HOSPITAL_COMMUNITY): Payer: Medicare Other

## 2018-01-16 DIAGNOSIS — D649 Anemia, unspecified: Secondary | ICD-10-CM

## 2018-01-16 DIAGNOSIS — R1012 Left upper quadrant pain: Secondary | ICD-10-CM

## 2018-01-16 DIAGNOSIS — Z931 Gastrostomy status: Secondary | ICD-10-CM

## 2018-01-16 DIAGNOSIS — K449 Diaphragmatic hernia without obstruction or gangrene: Secondary | ICD-10-CM

## 2018-01-16 HISTORY — PX: ESOPHAGOGASTRODUODENOSCOPY: SHX5428

## 2018-01-16 LAB — CBC
HEMATOCRIT: 25.9 % — AB (ref 39.0–52.0)
HEMOGLOBIN: 7.4 g/dL — AB (ref 13.0–17.0)
MCH: 23.1 pg — ABNORMAL LOW (ref 26.0–34.0)
MCHC: 28.6 g/dL — AB (ref 30.0–36.0)
MCV: 80.9 fL (ref 80.0–100.0)
NRBC: 0.2 % (ref 0.0–0.2)
Platelets: 344 10*3/uL (ref 150–400)
RBC: 3.2 MIL/uL — ABNORMAL LOW (ref 4.22–5.81)
RDW: 16.2 % — AB (ref 11.5–15.5)
WBC: 17 10*3/uL — AB (ref 4.0–10.5)

## 2018-01-16 LAB — BLOOD GAS, ARTERIAL
ACID-BASE EXCESS: 0.9 mmol/L (ref 0.0–2.0)
Bicarbonate: 24.6 mmol/L (ref 20.0–28.0)
DRAWN BY: 345601
FIO2: 40
MECHVT: 520 mL
O2 SAT: 99.2 %
PEEP/CPAP: 10 cmH2O
PH ART: 7.443 (ref 7.350–7.450)
Patient temperature: 98.6
RATE: 14 resp/min
pCO2 arterial: 36.5 mmHg (ref 32.0–48.0)
pO2, Arterial: 129 mmHg — ABNORMAL HIGH (ref 83.0–108.0)

## 2018-01-16 LAB — TYPE AND SCREEN
ABO/RH(D): A POS
Antibody Screen: NEGATIVE
UNIT DIVISION: 0
Unit division: 0

## 2018-01-16 LAB — GLUCOSE, CAPILLARY
GLUCOSE-CAPILLARY: 82 mg/dL (ref 70–99)
Glucose-Capillary: 199 mg/dL — ABNORMAL HIGH (ref 70–99)
Glucose-Capillary: 87 mg/dL (ref 70–99)
Glucose-Capillary: 91 mg/dL (ref 70–99)
Glucose-Capillary: 117 mg/dL — ABNORMAL HIGH (ref 70–99)
Glucose-Capillary: 89 mg/dL (ref 70–99)
Glucose-Capillary: 90 mg/dL (ref 70–99)

## 2018-01-16 LAB — MAGNESIUM: MAGNESIUM: 2.2 mg/dL (ref 1.7–2.4)

## 2018-01-16 LAB — BPAM RBC
BLOOD PRODUCT EXPIRATION DATE: 201910252359
ISSUE DATE / TIME: 201910161542
Unit Type and Rh: 9500

## 2018-01-16 LAB — PROCALCITONIN: PROCALCITONIN: 0.19 ng/mL

## 2018-01-16 LAB — BASIC METABOLIC PANEL
ANION GAP: 8 (ref 5–15)
BUN: 19 mg/dL (ref 8–23)
CO2: 25 mmol/L (ref 22–32)
Calcium: 8.5 mg/dL — ABNORMAL LOW (ref 8.9–10.3)
Chloride: 106 mmol/L (ref 98–111)
Creatinine, Ser: 1.2 mg/dL (ref 0.61–1.24)
GFR calc Af Amer: >60 mL/min (ref >60)
GFR, EST NON AFRICAN AMERICAN: 54 mL/min — AB (ref >60)
Glucose, Bld: 93 mg/dL (ref 70–99)
Potassium: 4.8 mmol/L (ref 3.5–5.1)
SODIUM: 139 mmol/L (ref 135–145)

## 2018-01-16 LAB — LACTIC ACID, PLASMA: LACTIC ACID, VENOUS: 2.2 mmol/L — AB (ref 0.5–1.9)

## 2018-01-16 LAB — URINE CULTURE: CULTURE: NO GROWTH

## 2018-01-16 LAB — STREP PNEUMONIAE URINARY ANTIGEN: STREP PNEUMO URINARY ANTIGEN: NEGATIVE

## 2018-01-16 LAB — PHOSPHORUS: Phosphorus: 4.3 mg/dL (ref 2.5–4.6)

## 2018-01-16 LAB — TROPONIN I: Troponin I: 0.18 ng/mL (ref <0.03)

## 2018-01-16 SURGERY — EGD (ESOPHAGOGASTRODUODENOSCOPY)
Anesthesia: Moderate Sedation

## 2018-01-16 MED ORDER — INFLUENZA VAC SPLIT HIGH-DOSE 0.5 ML IM SUSY
0.5000 mL | PREFILLED_SYRINGE | INTRAMUSCULAR | Status: DC
  Filled 2018-01-16: qty 0.5

## 2018-01-16 MED ORDER — FUROSEMIDE 10 MG/ML IJ SOLN
40.0000 mg | Freq: Once | INTRAMUSCULAR | Status: AC
  Administered 2018-01-16: 40 mg via INTRAVENOUS
  Filled 2018-01-16: qty 4

## 2018-01-16 NOTE — Consult Note (Signed)
[facility] Consult: 12:21 PM 01/23/2018  LOS: 1 day    Referring Provider: Dr. Nelda Marseille Primary Care Physician:  Colon Branch, MD Primary Gastroenterologist:  Dr. Garnett Farm, his grand daughters phone number is [phone]    Reason for Consultation: Left upper abdominal pain, acute anemia.   HPI: Fernando Moyer is a 84 y.o. male.  Hx diabetes.  ALS.  COPD.  Hyperlipidemia.  Paroxysmal A. fib on Eliquis.  2013 bio prosthetic AVR.  Anemia, Hgb 9.9 down from 13, microcytosis evaluated in 07/2016.  Peripheral vascular disease, bilateral carotid artery disease, s/p right carotid endarterectomy 2012.     Hxadenomatous colon polyps but has aged out of surveillance colonoscopy. 01/2013 colonoscopy (age 42): Rectal polyps, one sessile and the others hyperplastic appearing.  Sessile polyp biopsied: Tubular adenoma. 03/2015 EGD.  Large hiatal hernia.  Thick Schatzki's ring versus focal peptic stricture with 16 mm lumen.  Area dilated with balloon to 20 mm of pressure. 10/2015 EGD: At the time of the food impaction.  Tortuous esophagus.  Benign-appearing, nonobstructing, distal esophageal stenosis doubt food impaction or retention.  5 cm hiatal hernia.  Normal stomach and duodenum. 11/10/2015 EGD:   Benign esophageal stenosis, dilated to 18 mm.  Medium sized hiatal hernia. 11/24/15 EGD: Balloon dilation of esophageal stenosis to 20 mm 08/2016 barium esophagram: Moderate sized hiatal hernia and mild gastroesophageal reflux.  Barium pill lodged just above the hiatal hernia indicating short segment distal esophageal stricture. 10/2016 EGD: Distal esophageal/GE junction stricture balloon dilated to 17 mm.  Hiatal hernia.  Mild pan gastritis.  Medications recommended for continuing omeprazole 40 mg TID and ranitidine 150  mg at at bedtime 04/2017 EGD: Benign peptic stenosis dilated to 20 mm.  Dr. Ardis Hughs did not feel that this was the cause of his hoarseness and dysarthria.  Again noted was a medium sized hiatal hernia.  EGD/PEG tube placement 11/26/2017 at [facility] due to advancing ALS related dysphagia. Overall decline over the last several weeks.  However he still has use of his upper extremities and can ambulate. About a month after the PEG tube was placed he started to have pain in the left upper quadrant not necessarily at the site of the PEG but in the general region of the PEG site.  Using the tube for feedings did not worsen the symptoms.  He also reported to the doctor that he had seen some small amount of blood in his stools in recent days.  He has had fatigue for a few weeks associated with edema in his lower extremities.  Epigastric pain evaluated with CT on 01/06/18.  Study showed good positioning of the PEG, cholelithiasis, enlargement of moderate hiatal hernia.  Similar to 2014 infrarenal abdominal aortic ectasia.  Aortic atherosclerosis, coronary artery atherosclerosis and emphysema.  There was some interstitial thickening and groundglass opacity in the right lung possibly reflecting infection and/or aspiration.  Prostamegaly also noted.. Initially seen yesterday at primary care doctor's office where he was desaturating on room air and sent to the ED. In the [facility] ED his oxygen sats dropped to the 80s and he became noticeably short of breath, diaphoretic, pale.  He was intubated with hypoxemic respiratory failure.  Underwent bronchoscopy last night.  There were purulent secretions in the right and left lower lobes.  Bronchial alveolar lavage performed in the right lower lobe. Hgb 6.5 (14.5 in 07/2017).  >> 7.7 >> 7.4 after 1 U PRBCs.   MCV 81 Normal INR.   Renal function preserved, no elevation of BUN Lactic acid 8.8 >> 2.2.   BNP 411.   U/A with lots bacteria and WBCs.  Portable  chest x-ray with no change in bibasilar atelectasis/infiltrate.  Progression of small effusions and vascular congestion, query fluid overload, query pneumonia.  Despite the drop in Hgb patient has had a little in the way of overt bleeding.  Just some scant hemoptysis and small amounts of blood in the stool.  As patient is now intubated not able to get details as to the blood in the stool, he had just mentioned that yesterday in the office to the doctor, the family was not aware.  He had been constipated for several days and had a brown stool over the weekend and had been having brown stools since then.  No nausea or vomiting.  Past Medical History:  Diagnosis Date  . AAA (abdominal aortic aneurysm) ([place]) 05/2008   PER CARDIAC CATH 3.5 X 3. 5 CM  . ALS (amyotrophic lateral sclerosis) ([place])   . Aortic stenosis 05/2008   PER CARDIAC CATH, MILD, MITRAL STENOSIS  . Benign neoplasm of colon   . Bowen's disease    right arm BX: referred to dermatology  . Carotid artery occlusion    last u/s 3-11...Marland Kitchenper vasc.surgery 2007 cath: nonobstructive CAD. mil;d MS, trivial AoS, small AAA,  . Cataract    RIGHT removed  . Chronic back pain   . COPD (chronic obstructive pulmonary disease) ([place])   . Coronary artery disease 05/2008   MILD, MEDICAL MANAGEMENT  . Diabetes mellitus ([place]) 09/07/2011   no per pt- no medications  . Dyspnea    with exertion only  . GERD with stricture    w/hx of stricture  . History of nuclear stress test    Myoview 6/18: EF 51, normal perfusion; Low Risk  . Hyperlipidemia    takes Pravastatin daily  . Mitral stenosis 05/2008   PER CARDIAC CATH  . Myocardial infarction ([place]) 2012  . Peripheral neuropathy    right thigh; "it's been that way for years"  . Pulmonary hypertension ([place])   . PVD (peripheral vascular disease) ([place]) 12/07   per cath 12/07....iliac  . Stricture and stenosis of esophagus     Past Surgical History:  Procedure Laterality Date  . AORTIC VALVE  REPLACEMENT  08/17/2011   Procedure: AORTIC VALVE REPLACEMENT (AVR);  Surgeon: Gaye Pollack, MD;  Location: [facility];  Service: Open Heart Surgery;  Laterality: N/A;  . APPENDECTOMY    . CARDIAC CATHETERIZATION  02/2011   LAD 50, CFX OK, RCA40, EF 55%; PCWP 17, AoV  0.9 cm squared  . ENDARTERECTOMY  08-2010   Right carotid endarterectomy    . ESOPHAGEAL DILATION    . ESOPHAGOGASTRODUODENOSCOPY N/A 10/29/2015   Procedure: ESOPHAGOGASTRODUODENOSCOPY (EGD);  Surgeon: Jerene Bears, MD;  Location: [facility] ENDOSCOPY;  Service: Endoscopy;  Laterality: N/A;  . ESOPHAGOGASTRODUODENOSCOPY (EGD) WITH PROPOFOL N/A 11/10/2015   Procedure: ESOPHAGOGASTRODUODENOSCOPY (EGD) WITH PROPOFOL;  Surgeon: Milus Banister, MD;  Location: [facility] ENDOSCOPY;  Service:  Endoscopy;  Laterality: N/A;  . ESOPHAGOGASTRODUODENOSCOPY (EGD) WITH PROPOFOL N/A 11/24/2015   Procedure: ESOPHAGOGASTRODUODENOSCOPY (EGD) WITH PROPOFOL;  Surgeon: Milus Banister, MD;  Location: [facility] ENDOSCOPY;  Service: Endoscopy;  Laterality: N/A;  dil  . eye lid surgery Bilateral ~ 07-2015  . EYE SURGERY  10/14/13   cataract surgery LEFT eye   . LARYNGOSCOPY  08/29/2016   transnasal  . LEFT AND RIGHT HEART CATHETERIZATION WITH CORONARY ANGIOGRAM N/A 02/23/2011   Procedure: LEFT AND RIGHT HEART CATHETERIZATION WITH CORONARY ANGIOGRAM;  Surgeon: [name], MD;  Location[facility] C[facility] LAB;  Service: Cardiovascular;  Laterality: N/A;  . PEG TUBE PLACEMENT  10/2017  . STERNAL WIRES REMOVAL N/A 05/01/2013   Procedure: STERNAL WIRES REMOVAL;  Surgeon: Gaye Pollack, MD;  Location: [facility] OR;  Service: Thoracic;  Laterality: N/A;    Prior to Admission medications   Medication Sig Start Date End Date Taking? Authorizing Provider  acetaminophen (TYLENOL) 500 MG tablet Take 1,000 mg by mouth every 6 (six) hours as needed (pain). Reported on 04/21/2015   Yes [provider]  ELIQUIS 5 MG TABS tablet TAKE 1 TABLET TWICE A DAY 08/13/17  Yes [name], MD  furosemide  (LASIX) 40 MG tablet TAKE 1 TABLET DAILY 08/13/17  Yes [name], MD  KLOR-CON M20 20 MEQ tablet TAKE 1 TABLET DAILY 08/13/17  Yes [name], MD  omeprazole (PRILOSEC) 40 MG capsule Take 1 capsule (40 mg total) by mouth 2 (two) times daily. 12/16/17  Yes Paz, Alda Berthold, MD  polyethylene glycol [facility] Floria Raveling) packet Take 17 g by mouth 2 (two) times daily.    Yes [provider]  tiotropium (SPIRIVA HANDIHALER) 18 MCG inhalation capsule Place 1 capsule (18 mcg total) into inhaler and inhale daily. Patient taking differently: Place 1 capsule into inhaler and inhale 2 (two) times daily.  08/29/17  Yes Paz, Alda Berthold, MD  traMADol (ULTRAM) 50 MG tablet Take 1 tablet (50 mg total) by mouth every 12 (twelve) hours as needed for moderate pain. for pain 01/10/18  Yes Paz, Alda Berthold, MD  ferrous sulfate (FERROUSUL) 325 (65 FE) MG tablet Take 1 tablet (325 mg total) by mouth 2 (two) times daily with a meal. Patient not taking: Reported on 01/19/2018 08/03/16   Colon Branch, MD  pravastatin (PRAVACHOL) 40 MG tablet Take 1 tablet (40 mg total) by mouth daily. Patient not taking: Reported on 01/24/2018 06/03/17   Colon Branch, MD  UNABLE TO FIND Check CMP monthly x 2 months. Fax results to dr caress at [place] [phone]) 07/17/17   [provider]  [facility] referral for ALS and pain management.  Dx. ALS G12.21. Last MD eval: 07/17/2017.  Referral faxed to [facility], [place] [phone]. 07/18/17   [provider]    Scheduled Meds: . sodium chloride   Intravenous Once  . chlorhexidine gluconate (MEDLINE KIT)  15 mL Mouth Rinse BID  . etomidate  0.3 mg/kg Intravenous Once  . [START ON 01/17/2018] Influenza vac split quadrivalent PF  0.5 mL Intramuscular Tomorrow-1000  . insulin aspart  1-3 Units Subcutaneous Q4H  . mouth rinse  15 mL Mouth Rinse 10 times per day  . pantoprazole sodium  40 mg Per Tube BID  . rocuronium  1 mg/kg  Intravenous Once  . sodium chloride flush  10-40 mL Intracatheter Q12H   Infusions: . sodium chloride    . sodium chloride Stopped (01/14/2018 0609)  .  sodium chloride 50 mL/hr at 01/17/2018 0900  . piperacillin-tazobactam (ZOSYN)  IV 12.5 mL/hr at 01/14/2018 0900  . propofol (DIPRIVAN) infusion 40 mcg/kg/min (01/03/2018 0953)  . vancomycin Stopped (01/07/2018 0415)   PRN Meds: sodium chloride, fentaNYL (SUBLIMAZE) injection, fentaNYL (SUBLIMAZE) injection, sodium chloride flush   Allergies as of 01/10/2018  . (No Known Allergies)    Family History  Problem Relation Age of Onset  . Heart attack Father 78       MI  . Prostate cancer Brother 75  . Colon cancer Neg Hx   . Anesthesia problems Neg Hx   . Hypotension Neg Hx   . Malignant hyperthermia Neg Hx   . Pseudochol deficiency Neg Hx   . Stomach cancer Neg Hx   . Esophageal cancer Neg Hx   . Rectal cancer Neg Hx   . Pancreatic cancer Neg Hx     Social History   Socioeconomic History  . Marital status: Widowed    Spouse name: Not on file  . Number of children: 2  . Years of education: Not on file  . Highest education level: Not on file  Occupational History  . 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 Financial resource strain: Not on file  . Food insecurity:    Worry: Not on file    Inability: Not on file  . Transportation needs:    Medical: Not on file    Non-medical: Not on file  Tobacco Use  . Smoking status: Former Smoker    Packs/day: 2.00    Years: 30.00    Pack years: 60.00    Types: Cigarettes    Last attempt to quit: 04/03/1987    Years since quitting: 30.8  . Smokeless tobacco: Never Used  . Tobacco comment: quit smoking in 1989  Substance and Sexual Activity  . Alcohol use: No  . Drug use: No  . Sexual activity: Not on file  Lifestyle  . Physical activity:    Days per week: Not on file    Minutes per session:  Not on file  . Stress: Not on file  Relationships  . Social connections:    Talks on phone: Not on file    Gets together: Not on file    Attends religious service: Not on file    Active member of club or organization: Not on file    Attends meetings of clubs or organizations: Not on file    Relationship status: Not on file  . Intimate partner violence:    Fear of current or ex partner: Not on file    Emotionally abused: Not on file    Physically abused: Not on file    Forced sexual activity: Not on file  Other Topics Concern  . Not on file  Social History Narrative   LIVES BY HIMSELF    HAS 2 GROWN SONS, 5 G-KIDS    Melissa is her granddaughter, she is a dental assistant for the school system, usually comes with him for his appointments           REVIEW OF SYSTEMS: See HPI.  Unable to obtain review of systems from the patient.   PHYSICAL EXAM: Vital signs in last 24 hours: Vitals:   12/31/2017 0800 01/02/2018 1209  BP: (!) 111/49   Pulse: 78 86  Resp: 14 15  Temp: 98.4 F (36.9 C)     SpO2: 100% 100%   Wt Readings from Last 3 Encounters:  01/08/2018 80.2 kg  01/26/2018 77.8 kg  08/30/17 88.7 kg    General: Intubated, unresponsive on vent. Head: Looks pale.  No signs of head trauma.  ET tube and OG tube in place. Eyes: No scleral icterus.  No conjunctival pallor. Ears: Unable to assess hearing Nose: No discharge Mouth: Intubated.  No blood obvious at the mouth. Neck: No JVD, no masses, no thyromegaly. Lungs: Breathing is unlabored and even on the vent.  Lungs clear bilaterally in front. Heart: RRR.  No MRG.  S1, S2 present Abdomen: Soft.  Not tender or distended.  PEG site situated just to the left of midline in the upper abdomen.  The site looks benign there is no discharge, redness or swelling.  Umbilical hernia is small.  Bowel sounds normal but hypoactive..   Rectal: Not performed. Musc/Skeltl: No joint redness, swelling or gross deformity. Extremities: Slight  pitting edema in the legs up to the knees. Neurologic: Unresponsive Skin: No rashes, no sores.  There is a Band-Aid on what looks like an abrasion on his left knee. Nodes: Cervical adenopathy  Intake/Output from previous day: 10/16 0701 - 10/17 0700 In: 2145.4 [I.V.:770.1; Blood:525.2; IV Piggyback:810.1] Out: 214 [Urine:214] Intake/Output this shift: Total I/O In: 172.6 [I.V.:147.5; IV Piggyback:25.1] Out: 46 [Urine:46]  LAB RESULTS: Recent Labs    01/30/2018 1357 01/10/2018 1915 01/28/2018 0100  WBC 15.0* 17.2* 17.0*  HGB 6.5* 7.7* 7.4*  HCT 22.9* 27.5* 25.9*  PLT 400 333 344   BMET Lab Results  Component Value Date   NA 139 01/25/2018   NA 138 01/24/2018   NA 135 01/17/2018   K 4.8 01/18/2018   K 4.7 01/30/2018   K 4.1 12/31/2017   CL 106    CL 105 01/27/2018   CL 99 01/23/2018   CO2 25 01/03/2018   CO2 24 01/14/2018   CO2 25 12/31/2017   GLUCOSE 93 01/18/2018   GLUCOSE 168 (H) 01/28/2018   GLUCOSE 141 (H) 01/12/2018   BUN 19 01/07/2018   BUN 18 01/17/2018   BUN 19 01/09/2018   CREATININE 1.20 01/04/2018   CREATININE 1.13 01/11/2018   CREATININE 0.97 01/07/2018   CALCIUM 8.5 (L) 01/23/2018   CALCIUM 8.7 (L) 01/09/2018   CALCIUM 9.2 01/21/2018   LFT Recent Labs    01/30/2018 1915  PROT 6.4*  ALBUMIN 3.2*  AST 25  ALT 10  ALKPHOS 82  BILITOT 2.2*   PT/INR Lab Results  Component Value Date   INR 1.36 01/19/2018   INR 0.93 04/28/2013   INR 1.44 08/17/2011   Hepatitis Panel No results for input(s): HEPBSAG, HCVAB, HEPAIGM, HEPBIGM in the last 72 hours. C-Diff No components found for: CDIFF Lipase     Component Value Date/Time   LIPASE 36 01/25/2018 1915    Drugs of Abuse  No results found for: LABOPIA, COCAINSCRNUR, LABBENZ, AMPHETMU, THCU, LABBARB   RADIOLOGY STUDIES: Dg Chest Port 1 View  Result Date: 01/04/2018 CLINICAL DATA:  Respiratory failure. EXAM: PORTABLE CHEST 1 VIEW COMPARISON:  01/28/2018 FINDINGS: Endotracheal tube  in good position.  NG tube in the stomach. Bibasilar airspace disease unchanged. Small bilateral effusions have progressed. Pulmonary vascular congestion raising the possibility of fluid overload. IMPRESSION: Bibasilar atelectasis/infiltrate unchanged. Progression of small effusions and vascular congestion. Question fluid overload. Question pneumonia Electronically Signed   By: Iaan  Clark M.D.   On: 01/27/2018 07:35   Dg Chest Port 1 View  Result Date:   12/31/2017 CLINICAL DATA:  Endotracheal tube EXAM: PORTABLE CHEST 1 VIEW COMPARISON:  01/28/2018 FINDINGS: Endotracheal tube tip between the clavicular heads and carina. An orogastric tube tip at least reaches the stomach. Airspace disease in the lower lungs with probable small right effusion. No pneumothorax. Normal heart size and mediastinal contours. Aortic valve replacement. Background of emphysema. IMPRESSION: 1. New orogastric tube with tip reaching the stomach. 2. Stable endotracheal tube positioning. 3. Bilateral lower lobe pneumonia superimposed on COPD. Electronically Signed   By: Monte Fantasia M.D.   On: 01/14/2018 19:38   Dg Chest Portable 1 View  Result Date: 01/26/2018 CLINICAL DATA:  Intubation. EXAM: PORTABLE CHEST 1 VIEW COMPARISON:  01/09/2018 at 1420 hours FINDINGS: An endotracheal tube has been placed and terminates 3.5 cm above the carina. There is diffuse peribronchial thickening and accentuation of the interstitial markings with patchy airspace opacity in the right greater than left lung bases, unchanged. The interstitial markings are mildly more prominent than on a 07/04/2016 examination. IMPRESSION: 1. Endotracheal tube as above. 2. COPD with increased diffuse interstitial opacity and patchy bibasilar opacities which may reflect superimposed edema or pneumonia and atelectasis. Electronically Signed   By: Logan Bores M.D.   On: 01/25/2018 15:28   Dg Chest Port 1 View  Result Date: 01/14/2018 CLINICAL DATA:  Chest pain,  shortness of breath. EXAM: PORTABLE CHEST 1 VIEW COMPARISON:  Radiographs of July 04, 2016. FINDINGS: Stable cardiomediastinal silhouette. Minimal left basilar subsegmental atelectasis is noted. Mild right basilar opacity is noted concerning for pneumonia or atelectasis. Small pleural effusion cannot be excluded. No definite pneumothorax is noted. Bony thorax is unremarkable. IMPRESSION: Minimal left basilar subsegmental atelectasis. Mild right basilar atelectasis or pneumonia. Followup PA and lateral chest X-ray is recommended in 3-4 weeks following trial of antibiotic therapy to ensure resolution and exclude underlying malignancy. Electronically Signed   By: Marijo Conception, M.D.   On: 01/15/2018 14:49     IMPRESSION:   *   Left abd pain.  PEG tube placed 09/2017 due to neurogenic dysphasia from advancing ALS.  Pain began about 1 month later.  CT 09/8 showing no complications a/w PEG.     *    Hypoxic resp failure, likely from aspiration.  + sepsis.  Bronchoscopy last night On Vanc, Zosyn.    *    Normocytic anemia.  Minor blood with stool and hemoptysis reported but not significant hemorrhage level bleeding.    Has not been taking oral iron at home, was used in past.    *   Hx of esophageal strictures, repeated dilations, last in  On BID oral Protonix via PEG.  Home meds of Omeprazole 40 mg BID   *   Chronic Eliquis for Afib.  Last dose was 10/16 at 9 AM. Remote tissue AVR.     BNP is elevated, note order for echocardiogram.  PLAN:     *   Upper endoscopy at bedside today.  Spoke with his granddaughter, Nimesh Riolo, she is aware of the procedure and will be signing the consent.   Azucena Freed  01/01/2018, 12:21 PM Phone [phone]

## 2018-01-16 NOTE — Op Note (Signed)
[facility] Patient Name: Fernando Moyer Procedure Date : 01/07/2018 MRN: [id number] Attending MD: Jerene Bears , MD Date of Birth: 02/19/1934 CSN: [id number] Age: 84 Admit Type: Inpatient Procedure:                Upper GI endoscopy Indications:              Abdominal pain in the left upper quadrant,                            unexplained anemia, history of PEG tube in setting                            of ALS Providers:                Lajuan Lines. Hilarie Fredrickson, MD, Cleda Daub, RN, Cletis Athens,                            Technician Referring MD:             Jennet Maduro, MD Medicines:                Fentanyl 50 micrograms IV, propofol infusion per                            ICU team Complications:            No immediate complications. Estimated Blood Loss:     Estimated blood loss: none. Procedure:                Pre-Anesthesia Assessment:                           - Prior to the procedure, a History and Physical                            was performed, and patient medications and                            allergies were reviewed. The patient's tolerance of                            previous anesthesia was also reviewed. The risks                            and benefits of the procedure and the sedation                            options and risks were discussed with the patient.                            All questions were answered, and informed consent                            was obtained. Prior Anticoagulants: The patient has  taken no previous anticoagulant or antiplatelet                            agents. ASA Grade Assessment: IV - A patient with                            severe systemic disease that is a constant threat                            to life. After reviewing the risks and benefits,                            the patient was deemed in satisfactory condition to                            undergo the procedure.             After obtaining informed consent, the endoscope was                            passed under direct vision. Throughout the                            procedure, the patient's blood pressure, pulse, and                            oxygen saturations were monitored continuously. The                            GIF-H190 ([id number]) Olympus Adult EGD was introduced                            through the mouth, and advanced to the second part                            of duodenum. The upper GI endoscopy was                            accomplished without difficulty. The patient                            tolerated the procedure well. Scope In: Scope Out: Findings:      Normal mucosa was found in the entire esophagus.      A 2 cm hiatal hernia was present.      There was evidence of an intact gastrostomy with a patent G-tube present       in the gastric body. This was characterized by healthy appearing mucosa.      Localized mildly erythematous mucosa without bleeding was found in the       prepyloric region of the stomach. This is secondary to OG tube trauma.      The exam of the stomach was otherwise normal.      The examined duodenum was normal. Impression:               - Normal  mucosa was found in the entire esophagus.                           - 2 cm hiatal hernia.                           - Intact gastrostomy with a patent G-tube present                            characterized by healthy appearing mucosa.                           - Erythematous mucosa in the prepyloric region of                            the stomach secondary to OG tube trauma.                           - Normal examined duodenum.                           - No specimens collected. Moderate Sedation:      N/A Recommendation:           - Return patient to ICU for ongoing care. Procedure Code(s):        --- Professional ---                           [id number], Esophagogastroduodenoscopy, flexible,                             transoral; diagnostic, including collection of                            specimen(s) by brushing or washing, when performed                            (separate procedure) Diagnosis Code(s):        --- Professional ---                           K44.9, Diaphragmatic hernia without obstruction or                            gangrene                           Z93.1, Gastrostomy status                           R10.12, Left upper quadrant pain CPT copyright 2018 [employer]. All rights reserved. The codes documented in this report are preliminary and upon coder review may  be revised to meet current compliance requirements. Jerene Bears, MD 01/09/2018 5:52:50 PM This report has been signed electronically. Number of Addenda: 0

## 2018-01-16 NOTE — Progress Notes (Signed)
NAME:  Fernando Moyer, MRN:  [id number], DOB:  [birth year], LOS: 1 ADMISSION DATE:  01/24/2018, CONSULTATION DATE:  01/07/2018 REFERRING MD:  [facility], CHIEF COMPLAINT:  GI bleeding and SOB  Brief History   84 year old with history of ALS that has a PEG in place and frequent dysphagia who has been declining for the past few weeks.  Patient present to his primary MD's office where he was found to be desaturating on RA.  Patient was sent to the ED.  Of note, patient had a concern with epigastric pain when using PEG tube and a CT was done 9 days PTA there was a concern for aspiration.  Patient was intubated in [facility].  Patient was severely SOB and was intubated in the ED for hypoxemic respiratory failure.  Transferred to [facility] for further management.  Past Medical History  [facility] Events   10/16 intubated for airway protection 10/17: Remains ventilated.  Chest x-ray with bilateral airspace disease.  Continuing antibiotics, GI evaluating for abdominal complaints.  Ordering echocardiogram Consults: date of consult/date signed off & final recs:  GI  Procedures (surgical and bedside):  Bronchoscopy 10/16>>> ETT 10/16>>> PIV 10/16>>>  Significant Diagnostic Tests:  Bronch with no evidence of blood but purulent material in both lower lobes Echocardiogram 10/17>>>  Micro Data:  Blood 10/16>>> Urine 10/16>>> Sputum 10/16>>>  Antimicrobials:  Vanc 10/16>>> Zosyn 10/16>>>   Subjective:  sedated unable  Objective   Blood pressure (Abnormal) 111/49, pulse 86, temperature 98.4 F (36.9 C), temperature source Bladder, resp. rate 15, height 5\' 7"  (1.702 m), weight 80.2 kg, SpO2 100 %.    Vent Mode: PRVC FiO2 (%):  [40 %-100 %] 40 % Set Rate:  [12 bmp-14 bmp] 14 bmp Vt Set:  [520 mL-600 mL] 520 mL PEEP:  [5 cmH20-10 cmH20] 10 cmH20 Plateau Pressure:  [8 HYI50-27 cmH20] 18 cmH20   Intake/Output Summary (Last 24 hours) at 01/24/2018 1318 Last data  filed at 01/26/2018 7412 Gross per 24 hour  Intake 2318.03 ml  Output 260 ml  Net 2058.03 ml   Filed Weights   01/18/2018 1819 01/04/2018 0300  Weight: 78.6 kg 80.2 kg    Examination: General: Pleasant 84 year old male patient currently in no acute distress HEENT normocephalic atraumatic orally intubated no JVD Pulmonary: Coarse scattered breath sounds no accessory use Cardiac: Regular rate and rhythm Abdomen: Soft nontender no organomegaly Extremities: Warm dry and lower extremity edema is present strong pulses brisk cap refill Neuro: Sedated GU: Clear yellow Resolved Hospital Problem list   N/A  Assessment & Plan:  84 year old with advance ALS who presents to the hospital with aspiration pneumonia, respiratory failure and AMS.    Acute Hypoxic respiratory failure in setting of presumed aspiration superimposed on underlying ALS -prodrome of worsening fatigue and LE swelling raising concern for cardiac dysfxn as well Plan Cont full vent support  Cont  PAD protocol Day 2 vanc and zosyn Lasix x 1 Repeat CXR am  ECHO eval cards fxn F/u culture data  Abdominal pain: ? gastritis vs reflux w/ possible GIB  hgb as low as 6.5 got 2 units PRBC  Plan Cont PPI BID NPO until seen by GI  Anemia  -? GIB Plan Trend cbc Hold AC  ALS:  Plan Supportive care   Best practice    GI prophylaxis: PPI DVT prophylaxis: SCDs VAP protocol: started 10/16 PAD protocol: started 10/16 Nutrition: NPO until seen by GI Activity: bedrest  GOC: LCB with no CPR/cardiversion or trach, short term support only.  Disposition / Summary of Today's Plan 01/28/2018   Clinically stable on ventilatory support not in shock.  Will diuresis today.  Continue antibiotics, await GI evaluation would benefit from EGD.  We will hold off on tube feeds for now.  Most likely this is reflux with resultant aspiration but need to rule out cardiac issues given fatigue and edema   Labs   CBC: Recent Labs  Lab  01/02/2018 1357 01/23/2018 1915 01/21/2018 0100  WBC 15.0* 17.2* 17.0*  NEUTROABS  --  15.1*  --   HGB 6.5* 7.7* 7.4*  HCT 22.9* 27.5* 25.9*  MCV 81.8 83.1 80.9  PLT 400 333 888    Basic Metabolic Panel: Recent Labs  Lab 01/30/2018 1357 01/28/2018 1915 01/27/2018 0100  NA 135 138 139  K 4.1 4.7 4.8  CL 99 105 106  CO2 25 24 25   GLUCOSE 141* 168* 93  BUN 19 18 19   CREATININE 0.97 1.13 1.20  CALCIUM 9.2 8.7* 8.5*  MG  --  2.3 2.2  PHOS  --  5.0* 4.3   GFR: Estimated Creatinine Clearance: 48.1 mL/min (by C-G formula based on SCr of 1.2 mg/dL). Recent Labs  Lab 01/28/2018 1357 12/31/2017 1444 01/29/2018 1915 01/18/2018 0100  PROCALCITON  --   --  0.10 0.19  WBC 15.0*  --  17.2* 17.0*  LATICACIDVEN  --  8.80* 3.5* 2.2*    Liver Function Tests: Recent Labs  Lab 01/09/2018 1915  AST 25  ALT 10  ALKPHOS 82  BILITOT 2.2*  PROT 6.4*  ALBUMIN 3.2*   Recent Labs  Lab 01/27/2018 1915  LIPASE 36   No results for input(s): AMMONIA in the last 168 hours.  ABG    Component Value Date/Time   PHART 7.443 01/11/2018 0355   PCO2ART 36.5 01/26/2018 0355   PO2ART 129 (H) 01/29/2018 0355   HCO3 24.6 01/28/2018 0355   TCO2 26 01/26/2018 1609   ACIDBASEDEF 1.5 01/07/2018 1830   O2SAT 99.2 01/27/2018 0355     Coagulation Profile: Recent Labs  Lab 01/19/2018 1915  INR 1.36    Cardiac Enzymes: Recent Labs  Lab 01/28/2018 1357  TROPONINI 0.11*    HbA1C: Hgb A1c MFr Bld  Date/Time Value Ref Range Status  08/13/2017 02:59 PM 6.3 4.6 - 6.5 % Final    Comment:    Glycemic Control Guidelines for People with Diabetes:Non Diabetic:  <6%Goal of Therapy: <7%Additional Action Suggested:  >8%   08/03/2016 12:15 PM 5.8 4.6 - 6.5 % Final    Comment:    Glycemic Control Guidelines for People with Diabetes:Non Diabetic:  <6%Goal of Therapy: <7%Additional Action Suggested:  >8%     CBG: Recent Labs  Lab 01/23/2018 1927 01/13/2018 2341 01/01/2018 0332 12/31/2017 0812 01/30/2018 [place]*  [phone]    Erick Colace ACNP-BC [place] Pager # [phone] OR # [phone] if no answer

## 2018-01-16 NOTE — Progress Notes (Signed)
Initial Nutrition Assessment  DOCUMENTATION CODES:   Not applicable  INTERVENTION:   If unable to extubate within 48 hours, recommend:  Initiate TF via PEG with Vital High Protein at goal rate of 50 ml/h (1200 ml per day) to provide 1200 kcals, 105 gm protein, 1008 ml free water daily.  TF + propofol at current rate provides 1694 kcals (95% of estimated kcal needs)  NUTRITION DIAGNOSIS:   Inadequate oral intake related to inability to eat as evidenced by NPO status.  GOAL:   Patient will meet greater than or equal to 90% of their needs  MONITOR:   Vent status, Weight trends, Labs, Skin, I & O's  REASON FOR ASSESSMENT:   Consult, Ventilator Enteral/tube feeding initiation and management  ASSESSMENT:   84 year old with advance ALS who presents to the hospital with aspiration pneumonia, respiratory failure and AMS.   Patient is currently intubated on ventilator support MV: 10.3 L/min Temp (24hrs), Avg:98.5 F (36.9 C), Min:98.1 F (36.7 C), Max:99.5 F (37.5 C)  Propofol: 18.7 ml/hr (provides 494 kcals daily)  Case discussed with RN prior to visit, who reports that pt has a pre-existing PEG, however, plan to hold TF until GI eval.   Spoke with pt's family members at bedside, who report that pt was diagnosed with ALS approximately 2 years ago. They reports PEG was placed approximately 2-3 months ago, however, only started administering feedings over the past month. Pt still eats for comfort (mostly Ensure, ice cream, and applesauce crushed with medications). Home TF regimen is as follows: 1304 ml Ensure Plus daily, along with 60 ml water before and after each feeding administration, and 300 ml free water flush daily (approximately 1925 kcals and 72 grams protein). Per daughter, pt is still transitioning to full bolus feeding volume, but always makes sure pt gets full amount of free water flushes to avoid dehydration. They work closely with the [place] [facility].   Pt family with  very detailed wt records. Noted wt on 12/31/17 was 155.4 lbs. Pt has been losing weight progressively over the past year. Noted that pt has experienced a 15% wt loss over the past year, which while not significant for time frame is concerning given ALS and declining oral intake.   Discussed plan for TF once cleared by GI. Explained to family that TF regimen will be adjusted from home regimen in order to better meet pt's needs during acute illness.   Labs reviewed: CBGS: 87-91 (inpatient orders for glycemic control are 1-3 units insulin aspart every 4 hours).   NUTRITION - FOCUSED PHYSICAL EXAM:    Most Recent Value  Orbital Region  Mild depletion  Upper Arm Region  Mild depletion  Thoracic and Lumbar Region  No depletion  Buccal Region  No depletion  Temple Region  Mild depletion  Clavicle Bone Region  No depletion  Clavicle and Acromion Bone Region  No depletion  Scapular Bone Region  No depletion  Dorsal Hand  No depletion  Patellar Region  Mild depletion  Anterior Thigh Region  Mild depletion  Posterior Calf Region  Mild depletion  Edema (RD Assessment)  Mild  Hair  Reviewed  Eyes  Reviewed  Mouth  Reviewed  Skin  Reviewed  Nails  Reviewed       Diet Order:   Diet Order            Diet NPO time specified  Diet effective now  EDUCATION NEEDS:   Education needs have been addressed  Skin:  Skin Assessment: Reviewed RN Assessment  Last BM:  PTA  Height:   Ht Readings from Last 1 Encounters:  01/21/2018 5\' 7"  (1.702 m)    Weight:   Wt Readings from Last 1 Encounters:  01/22/2018 80.2 kg    Ideal Body Weight:  67.3 kg  BMI:  Body mass index is 27.69 kg/m.  Estimated Nutritional Needs:   Kcal:  1778  Protein:  105-120 grams  Fluid:  per MD    Zendaya Groseclose A. Jimmye Norman, RD, LDN, CDE Pager: [phone] After hours Pager: [phone]

## 2018-01-17 ENCOUNTER — Inpatient Hospital Stay (HOSPITAL_COMMUNITY): Payer: Medicare Other

## 2018-01-17 DIAGNOSIS — I361 Nonrheumatic tricuspid (valve) insufficiency: Secondary | ICD-10-CM

## 2018-01-17 DIAGNOSIS — K922 Gastrointestinal hemorrhage, unspecified: Secondary | ICD-10-CM

## 2018-01-17 LAB — CBC
HEMATOCRIT: 20.8 % — AB (ref 39.0–52.0)
HEMOGLOBIN: 5.8 g/dL — AB (ref 13.0–17.0)
MCH: 22.8 pg — ABNORMAL LOW (ref 26.0–34.0)
MCHC: 27.9 g/dL — ABNORMAL LOW (ref 30.0–36.0)
MCV: 81.9 fL (ref 80.0–100.0)
Platelets: 304 10*3/uL (ref 150–400)
RBC: 2.54 MIL/uL — ABNORMAL LOW (ref 4.22–5.81)
RDW: 16.9 % — ABNORMAL HIGH (ref 11.5–15.5)
WBC: 11.3 10*3/uL — AB (ref 4.0–10.5)
nRBC: 0.2 % (ref 0.0–0.2)

## 2018-01-17 LAB — CBC WITH DIFFERENTIAL/PLATELET
Abs Immature Granulocytes: 0.04 10*3/uL (ref 0.00–0.07)
Basophils Absolute: 0.1 10*3/uL (ref 0.0–0.1)
Basophils Relative: 1 %
EOS ABS: 0.2 10*3/uL (ref 0.0–0.5)
EOS PCT: 2 %
HEMATOCRIT: 27.1 % — AB (ref 39.0–52.0)
Hemoglobin: 8.1 g/dL — ABNORMAL LOW (ref 13.0–17.0)
Immature Granulocytes: 0 %
LYMPHS ABS: 1.5 10*3/uL (ref 0.7–4.0)
Lymphocytes Relative: 15 %
MCH: 24.3 pg — AB (ref 26.0–34.0)
MCHC: 29.9 g/dL — AB (ref 30.0–36.0)
MCV: 81.1 fL (ref 80.0–100.0)
Monocytes Absolute: 1 10*3/uL (ref 0.1–1.0)
Monocytes Relative: 9 %
NEUTROS PCT: 73 %
NRBC: 0 % (ref 0.0–0.2)
Neutro Abs: 7.5 10*3/uL (ref 1.7–7.7)
Platelets: 298 10*3/uL (ref 150–400)
RBC: 3.34 MIL/uL — AB (ref 4.22–5.81)
RDW: 16.1 % — AB (ref 11.5–15.5)
WBC: 10.3 10*3/uL (ref 4.0–10.5)

## 2018-01-17 LAB — MAGNESIUM: Magnesium: 2.2 mg/dL (ref 1.7–2.4)

## 2018-01-17 LAB — BLOOD CULTURE ID PANEL (REFLEXED)
Acinetobacter baumannii: NOT DETECTED
CANDIDA ALBICANS: NOT DETECTED
CANDIDA TROPICALIS: NOT DETECTED
Candida glabrata: NOT DETECTED
Candida krusei: NOT DETECTED
Candida parapsilosis: NOT DETECTED
ENTEROBACTERIACEAE SPECIES: NOT DETECTED
Enterobacter cloacae complex: NOT DETECTED
Enterococcus species: NOT DETECTED
Escherichia coli: NOT DETECTED
HAEMOPHILUS INFLUENZAE: NOT DETECTED
KLEBSIELLA PNEUMONIAE: NOT DETECTED
Klebsiella oxytoca: NOT DETECTED
Listeria monocytogenes: NOT DETECTED
METHICILLIN RESISTANCE: NOT DETECTED
NEISSERIA MENINGITIDIS: NOT DETECTED
PROTEUS SPECIES: NOT DETECTED
Pseudomonas aeruginosa: NOT DETECTED
SERRATIA MARCESCENS: NOT DETECTED
STAPHYLOCOCCUS AUREUS BCID: NOT DETECTED
STAPHYLOCOCCUS SPECIES: DETECTED — AB
Streptococcus agalactiae: NOT DETECTED
Streptococcus pneumoniae: NOT DETECTED
Streptococcus pyogenes: NOT DETECTED
Streptococcus species: NOT DETECTED

## 2018-01-17 LAB — POCT I-STAT 3, ART BLOOD GAS (G3+)
Acid-Base Excess: 2 mmol/L (ref 0.0–2.0)
Bicarbonate: 26.3 mmol/L (ref 20.0–28.0)
O2 Saturation: 98 %
PCO2 ART: 37.8 mmHg (ref 32.0–48.0)
PH ART: 7.45 (ref 7.350–7.450)
PO2 ART: 108 mmHg (ref 83.0–108.0)
Patient temperature: 98.6
TCO2: 27 mmol/L (ref 22–32)

## 2018-01-17 LAB — ECHOCARDIOGRAM COMPLETE
Height: 67 in
Weight: 2825.42 oz

## 2018-01-17 LAB — HEPATIC FUNCTION PANEL
ALT: 7 U/L (ref 0–44)
AST: 23 U/L (ref 15–41)
Albumin: 2.5 g/dL — ABNORMAL LOW (ref 3.5–5.0)
Alkaline Phosphatase: 67 U/L (ref 38–126)
Bilirubin, Direct: 0.6 mg/dL — ABNORMAL HIGH (ref 0.0–0.2)
Indirect Bilirubin: 1.2 mg/dL — ABNORMAL HIGH (ref 0.3–0.9)
Total Bilirubin: 1.8 mg/dL — ABNORMAL HIGH (ref 0.3–1.2)
Total Protein: 5.6 g/dL — ABNORMAL LOW (ref 6.5–8.1)

## 2018-01-17 LAB — URINE CULTURE: CULTURE: NO GROWTH

## 2018-01-17 LAB — BASIC METABOLIC PANEL
ANION GAP: 8 (ref 5–15)
BUN: 23 mg/dL (ref 8–23)
CHLORIDE: 106 mmol/L (ref 98–111)
CO2: 26 mmol/L (ref 22–32)
Calcium: 8.2 mg/dL — ABNORMAL LOW (ref 8.9–10.3)
Creatinine, Ser: 1.35 mg/dL — ABNORMAL HIGH (ref 0.61–1.24)
GFR calc Af Amer: 55 mL/min — ABNORMAL LOW (ref >60)
GFR calc non Af Amer: 47 mL/min — ABNORMAL LOW (ref >60)
GLUCOSE: 93 mg/dL (ref 70–99)
POTASSIUM: 3.4 mmol/L — AB (ref 3.5–5.1)
Sodium: 140 mmol/L (ref 135–145)

## 2018-01-17 LAB — PHOSPHORUS: PHOSPHORUS: 4 mg/dL (ref 2.5–4.6)

## 2018-01-17 LAB — LACTATE DEHYDROGENASE: LDH: 236 U/L — AB (ref 98–192)

## 2018-01-17 LAB — GLUCOSE, CAPILLARY
GLUCOSE-CAPILLARY: 101 mg/dL — AB (ref 70–99)
GLUCOSE-CAPILLARY: 89 mg/dL (ref 70–99)
Glucose-Capillary: 95 mg/dL (ref 70–99)
Glucose-Capillary: 91 mg/dL (ref 70–99)
Glucose-Capillary: 71 mg/dL (ref 70–99)

## 2018-01-17 LAB — PREPARE RBC (CROSSMATCH)

## 2018-01-17 LAB — PROCALCITONIN: Procalcitonin: 0.21 ng/mL

## 2018-01-17 MED ORDER — SODIUM CHLORIDE 0.9% IV SOLUTION
Freq: Once | INTRAVENOUS | Status: AC
  Administered 2018-01-17: 09:00:00 via INTRAVENOUS

## 2018-01-17 MED ORDER — TECHNETIUM TC 99M-LABELED RED BLOOD CELLS IV KIT
25.5000 | PACK | Freq: Once | INTRAVENOUS | Status: AC | PRN
  Administered 2018-01-17: 25.5 via INTRAVENOUS

## 2018-01-17 MED ORDER — FUROSEMIDE 10 MG/ML IJ SOLN
40.0000 mg | Freq: Once | INTRAMUSCULAR | Status: AC
  Administered 2018-01-17: 40 mg via INTRAVENOUS
  Filled 2018-01-17: qty 4

## 2018-01-17 NOTE — Progress Notes (Signed)
PHARMACY - PHYSICIAN COMMUNICATION CRITICAL VALUE ALERT - BLOOD CULTURE IDENTIFICATION (BCID)  Fernando Moyer is an 84 y.o. male who presented to [facility] on 01/09/2018 with a chief complaint of GIB, SOB  Name of physician (or Provider) Contacted: Marni Griffon, NP  Current antibiotics: zosyn  Changes to prescribed antibiotics recommended:  1/4 bottles with coag negative staph>>likely contaminant, no changes needed  Results for orders placed or performed during the hospital encounter of 01/14/2018  Blood Culture ID Panel (Reflexed) (Collected: 01/25/2018  1:56 PM)  Result Value Ref Range   Enterococcus species NOT DETECTED NOT DETECTED   Listeria monocytogenes NOT DETECTED NOT DETECTED   Staphylococcus species DETECTED (A) NOT DETECTED   Staphylococcus aureus (BCID) NOT DETECTED NOT DETECTED   Methicillin resistance NOT DETECTED NOT DETECTED   Streptococcus species NOT DETECTED NOT DETECTED   Streptococcus agalactiae NOT DETECTED NOT DETECTED   Streptococcus pneumoniae NOT DETECTED NOT DETECTED   Streptococcus pyogenes NOT DETECTED NOT DETECTED   Acinetobacter baumannii NOT DETECTED NOT DETECTED   Enterobacteriaceae species NOT DETECTED NOT DETECTED   Enterobacter cloacae complex NOT DETECTED NOT DETECTED   Escherichia coli NOT DETECTED NOT DETECTED   Klebsiella oxytoca NOT DETECTED NOT DETECTED   Klebsiella pneumoniae NOT DETECTED NOT DETECTED   Proteus species NOT DETECTED NOT DETECTED   Serratia marcescens NOT DETECTED NOT DETECTED   Haemophilus influenzae NOT DETECTED NOT DETECTED   Neisseria meningitidis NOT DETECTED NOT DETECTED   Pseudomonas aeruginosa NOT DETECTED NOT DETECTED   Candida albicans NOT DETECTED NOT DETECTED   Candida glabrata NOT DETECTED NOT DETECTED   Candida krusei NOT DETECTED NOT DETECTED   Candida parapsilosis NOT DETECTED NOT DETECTED   Candida tropicalis NOT DETECTED NOT DETECTED    Estrellita Lasky, Rande Lawman 01/17/2018  11:20 AM

## 2018-01-17 NOTE — Progress Notes (Signed)
RT NOTES: Patient transported to [facility] med on vent without incident.

## 2018-01-17 NOTE — Progress Notes (Signed)
Pt placed back on full vent support by NP due to sedation being given.

## 2018-01-17 NOTE — Care Management Important Message (Signed)
Important Message  Patient Details  Name: JAYVAN MCSHAN MRN: [id number] Date of Birth: 01-Oct-1933   Medicare Important Message Given:  Yes    Senie Lanese 01/17/2018, 3:57 PM

## 2018-01-17 NOTE — Progress Notes (Signed)
  Echocardiogram 2D Echocardiogram has been performed.  Merrie Roof F 01/17/2018, 3:16 PM

## 2018-01-17 NOTE — Progress Notes (Signed)
NAME:  Fernando Moyer, MRN:  [id number], DOB:  [birth year], LOS: 2 ADMISSION DATE:  01/24/2018, CONSULTATION DATE:  01/23/2018 [facility]ERRING MD:  [facility], CHIEF COMPLAINT:  GI bleeding and SOB  Brief History   84 year old with history of ALS that has a PEG in place and frequent dysphagia who has been declining for the past few weeks.  Patient present to his primary MD's office where he was found to be desaturating on RA.  Patient was sent to the ED.  Of note, patient had a concern with epigastric pain when using PEG tube and a CT was done 9 days PTA there was a concern for aspiration.  Patient was int[facility] in [facility].  Patient was severely SOB and was intubated in the ED for hypoxemic respiratory failure.  Transferred to [facility] for further management.  Past Medical History  [place] [facility] Events   10/16 intubated for airway protection 10/17: Remains ventilated.  Chest x-ray with bilateral airspace disease.  Continuing antibiotics, GI evaluating for abdominal complaints.  Ordering echocardiogram.  EGD was obtained, no clear source of bleeding identified in the esophagus, stomach or small bowel.  Hemodynamically stable. 10/18: Initiated weaning trial and demonstrated acceptable ventilator mechanics however CBC came back with hemoglobin once again down to 5.8, this had gone up to as high as 7.7 after the transfusion on admission day.  Because of this weaning was held.   Consults: date of consult/date signed off & final recs:  GI  Procedures (surgical and bedside):  Bronchoscopy 10/16>>>no evidence of bleeding  ETT 10/16>>> PIV 10/16>>>  Significant Diagnostic Tests:  Bronch with no evidence of blood but purulent material in both lower lobes Echocardiogram 10/17>>> EGD on 10/17: No evidence of bleeding in esophagus, duodenum, or stomach Micro Data:  Blood 10/16>>> Urine 10/16>>>neg Sputum 10/16>>>  Antimicrobials:  Vanc 10/16>>>10/18 Zosyn 10/16>>>    Subjective:  Appears comfortable on the ventilator, does get restless when sedation weaned  Objective   Blood pressure (Abnormal) 135/46, pulse 92, temperature 98.8 F (37.1 C), temperature source Bladder, resp. rate 18, height 5\' 7"  (1.702 m), weight 80.1 kg, SpO2 100 %.    Vent Mode: PSV;CPAP FiO2 (%):  [30 %-40 %] 30 % Set Rate:  [14 bmp] 14 bmp Vt Set:  [520 mL] 520 mL PEEP:  [5 cmH20-10 cmH20] 5 cmH20 Pressure Support:  [5 cmH20] 5 cmH20 Plateau Pressure:  [13 cmH20-18 cmH20] 13 cmH20   Intake/Output Summary (Last 24 hours) at 01/17/2018 0934 Last data filed at 01/17/2018 0931 Gross per 24 hour  Intake 1161.22 ml  Output 1206 ml  Net -44.78 ml   Filed Weights   01/22/2018 1819 12/31/2017 0300 01/17/18 0500  Weight: 78.6 kg 80.2 kg 80.1 kg    Examination: General: This is an 84 year old white male is currently sedated on propofol infusion HEENT normocephalic atraumatic no jugular venous distention orally intubated Pulmonary: Bibasilar rales, no accessory use, tidal volumes in the 4-500 range on pressure support 5 cmH2O no accessory use on this Cardiac: Regular rate and rhythm without murmur rub or gallop Abdomen: Soft, not tender today.  Positive bowel sounds. Extremities: Trace lower extremity edema warm, dry, strong pulses. Neuro: Opens eyes to voice, moves all extremities, no focal deficits GU: Clear yellow urine via catheter  Resolved Hospital Problem list   N/A  Assessment & Plan:  84 year old with advance ALS who presents to the hospital with aspiration pneumonia, respiratory failure and AMS.  Acute Hypoxic respiratory failure in setting of presumed aspiration superimposed on underlying ALS -Portable chest x-ray personally reviewed this demonstrates persistent bilateral airspace disease, support tubes and lines are in satisfactory position Plan Continue full ventilator support, Daily assessment for weaning Day #3 antibiotics, PCR for MRSA is negative so we  will discontinue vancomycin IV Lasix again today Would like to see hemoglobin stabilized before we extubate him Continue PAD protocol, RASS goal 0 to -1 Follow-up echocardiogram  Abdominal pain: Etiology remains unclear, EEG was completed on the 17th without evidence of esophagitis or gastritis no ulcerative disease identified Plan Continuing PPI via tube  Acute blood loss anemia.  EGD was negative.  Granddaughters do report some frank blood in stool. Resented with hemoglobin of 6.5 this improved to 7.7 posttransfusion and once again is now at 5.8.  There is no clear bleeding currently Plan Trend CBC Transfuse 2 units Have placed call to GI service, question colonoscopy versus tagged red blood cell study?  ALS: Did have progressive fatigue for about 2 to 3 weeks prior to presentation.  This certainly could have been due to anemia, also consider cardiac etiology Plan Supportive care Continue to work-up anemia Follow-up echocardiogram  Best practice    GI prophylaxis: PPI DVT prophylaxis: SCDs VAP protocol: started 10/16 PAD protocol: started 10/16 Nutrition: NPO, will initiate after discussing with GI Activity: bedrest  GOC: LCB with no CPR/cardiversion or trach, short term support only.  Disposition / Summary of Today's Plan 01/17/18   Clinically stable on ventilatory support not in shock.  Will diuresis today.  Continue antibiotics, await GI evaluation would benefit from EGD.  We will hold off on tube feeds for now.  Most likely this is reflux with resultant aspiration but need to rule out cardiac issues given fatigue and edema   Labs   CBC: Recent Labs  Lab 01/28/2018 1357 01/22/2018 1915 01/17/2018 0100 01/17/18 0511  WBC 15.0* 17.2* 17.0* 11.3*  NEUTROABS  --  15.1*  --   --   HGB 6.5* 7.7* 7.4* 5.8*  HCT 22.9* 27.5* 25.9* 20.8*  MCV 81.8 83.1 80.9 81.9  PLT 400 333 344 024    Basic Metabolic Panel: Recent Labs  Lab 01/19/2018 1357 01/25/2018 1915 01/09/2018 0100   NA 135 138 139  K 4.1 4.7 4.8  CL 99 105 106  CO2 25 24 25   GLUCOSE 141* 168* 93  BUN 19 18 19   CREATININE 0.97 1.13 1.20  CALCIUM 9.2 8.7* 8.5*  MG  --  2.3 2.2  PHOS  --  5.0* 4.3   GFR: Estimated Creatinine Clearance: 48.1 mL/min (by C-G formula based on SCr of 1.2 mg/dL). Recent Labs  Lab 12/31/2017 1357 01/01/2018 1444 01/18/2018 1915 01/21/2018 0100 01/17/18 0511  PROCALCITON  --   --  0.10 0.19  --   WBC 15.0*  --  17.2* 17.0* 11.3*  LATICACIDVEN  --  8.80* 3.5* 2.2*  --     Liver Function Tests: Recent Labs  Lab 01/04/2018 1915  AST 25  ALT 10  ALKPHOS 82  BILITOT 2.2*  PROT 6.4*  ALBUMIN 3.2*   Recent Labs  Lab 01/25/2018 1915  LIPASE 36   No results for input(s): AMMONIA in the last 168 hours.  ABG    Component Value Date/Time   PHART 7.450 01/17/2018 0348   PCO2ART 37.8 01/17/2018 0348   PO2ART 108.0 01/17/2018 0348   HCO3 26.3 01/17/2018 0348   TCO2 27 01/17/2018 0348   ACIDBASEDEF 1.5 01/27/2018  1830   O2SAT 98.0 01/17/2018 0348     Coagulation Profile: Recent Labs  Lab 01/09/2018 1915  INR 1.36    Cardiac Enzymes: Recent Labs  Lab 01/26/2018 1357 01/15/2018 1359  TROPONINI 0.11* 0.18*    HbA1C: Hgb A1c MFr Bld  Date/Time Value Ref Range Status  08/13/2017 02:59 PM 6.3 4.6 - 6.5 % Final    Comment:    Glycemic Control Guidelines for People with Diabetes:Non Diabetic:  <6%Goal of Therapy: <7%Additional Action Suggested:  >8%   08/03/2016 12:15 PM 5.8 4.6 - 6.5 % Final    Comment:    Glycemic Control Guidelines for People with Diabetes:Non Diabetic:  <6%Goal of Therapy: <7%Additional Action Suggested:  >8%     CBG: Recent Labs  Lab 01/03/2018 1604 01/21/2018 1947 01/18/2018 2327 01/17/18 0319 01/17/18 [place] ACNP-BC [employer] Pager # [phone] OR # [phone] if no answer

## 2018-01-18 ENCOUNTER — Inpatient Hospital Stay (HOSPITAL_COMMUNITY): Payer: Medicare Other

## 2018-01-18 LAB — RETICULOCYTES
Immature Retic Fract: 27.4 % — ABNORMAL HIGH (ref 2.3–15.9)
RBC.: 3.32 MIL/uL — AB (ref 4.22–5.81)
RETIC CT PCT: 3 % (ref 0.4–3.1)
Retic Count, Absolute: 101.5 10*3/uL (ref 19.0–186.0)

## 2018-01-18 LAB — GLUCOSE, CAPILLARY
GLUCOSE-CAPILLARY: 110 mg/dL — AB (ref 70–99)
GLUCOSE-CAPILLARY: 97 mg/dL (ref 70–99)
Glucose-Capillary: 118 mg/dL — ABNORMAL HIGH (ref 70–99)
Glucose-Capillary: 70 mg/dL (ref 70–99)
Glucose-Capillary: 72 mg/dL (ref 70–99)
Glucose-Capillary: 85 mg/dL (ref 70–99)

## 2018-01-18 LAB — CBC
HEMATOCRIT: 27.1 % — AB (ref 39.0–52.0)
HEMOGLOBIN: 8.3 g/dL — AB (ref 13.0–17.0)
MCH: 25 pg — ABNORMAL LOW (ref 26.0–34.0)
MCHC: 30.6 g/dL (ref 30.0–36.0)
MCV: 81.6 fL (ref 80.0–100.0)
Platelets: 301 10*3/uL (ref 150–400)
RBC: 3.32 MIL/uL — AB (ref 4.22–5.81)
RDW: 16.2 % — AB (ref 11.5–15.5)
WBC: 11 10*3/uL — AB (ref 4.0–10.5)
nRBC: 0 % (ref 0.0–0.2)

## 2018-01-18 LAB — CULTURE, BAL-QUANTITATIVE

## 2018-01-18 LAB — COMPREHENSIVE METABOLIC PANEL
ALBUMIN: 2.5 g/dL — AB (ref 3.5–5.0)
ALT: 6 U/L (ref 0–44)
AST: 16 U/L (ref 15–41)
Alkaline Phosphatase: 73 U/L (ref 38–126)
Anion gap: 13 (ref 5–15)
BILIRUBIN TOTAL: 2.1 mg/dL — AB (ref 0.3–1.2)
BUN: 20 mg/dL (ref 8–23)
CO2: 26 mmol/L (ref 22–32)
CREATININE: 1.28 mg/dL — AB (ref 0.61–1.24)
Calcium: 8.4 mg/dL — ABNORMAL LOW (ref 8.9–10.3)
Chloride: 104 mmol/L (ref 98–111)
GFR calc Af Amer: 58 mL/min — ABNORMAL LOW (ref >60)
GFR, EST NON AFRICAN AMERICAN: 50 mL/min — AB (ref >60)
GLUCOSE: 91 mg/dL (ref 70–99)
POTASSIUM: 3.1 mmol/L — AB (ref 3.5–5.1)
Sodium: 143 mmol/L (ref 135–145)
Total Protein: 5.5 g/dL — ABNORMAL LOW (ref 6.5–8.1)

## 2018-01-18 LAB — IRON AND TIBC
Iron: 25 ug/dL — ABNORMAL LOW (ref 45–182)
SATURATION RATIOS: 6 % — AB (ref 17.9–39.5)
TIBC: 410 ug/dL (ref 250–450)
UIBC: 385 ug/dL

## 2018-01-18 LAB — CULTURE, BAL-QUANTITATIVE W GRAM STAIN: Culture: NO GROWTH

## 2018-01-18 LAB — CULTURE, BLOOD (ROUTINE X 2): SPECIAL REQUESTS: ADEQUATE

## 2018-01-18 LAB — LEGIONELLA PNEUMOPHILA SEROGP 1 UR AG: L. pneumophila Serogp 1 Ur Ag: NEGATIVE

## 2018-01-18 LAB — TRIGLYCERIDES: TRIGLYCERIDES: 127 mg/dL (ref <150)

## 2018-01-18 LAB — HAPTOGLOBIN: HAPTOGLOBIN: 268 mg/dL — AB (ref 34–200)

## 2018-01-18 LAB — FOLATE: Folate: 17.5 ng/mL (ref >5.9)

## 2018-01-18 LAB — VITAMIN B12: Vitamin B-12: 530 pg/mL (ref 180–914)

## 2018-01-18 LAB — FERRITIN: FERRITIN: 12 ng/mL — AB (ref 24–336)

## 2018-01-18 MED ORDER — DEXTROSE 50 % IV SOLN
25.0000 mL | Freq: Once | INTRAVENOUS | Status: AC
  Administered 2018-01-18: 25 mL via INTRAVENOUS

## 2018-01-18 MED ORDER — FUROSEMIDE 10 MG/ML IJ SOLN
40.0000 mg | Freq: Four times a day (QID) | INTRAMUSCULAR | Status: AC
  Administered 2018-01-18 (×3): 40 mg via INTRAVENOUS
  Filled 2018-01-18 (×3): qty 4

## 2018-01-18 MED ORDER — POTASSIUM CHLORIDE 20 MEQ/15ML (10%) PO SOLN
40.0000 meq | ORAL | Status: AC
  Administered 2018-01-18 (×3): 40 meq
  Filled 2018-01-18 (×3): qty 30

## 2018-01-18 MED ORDER — DEXTROSE 50 % IV SOLN
INTRAVENOUS | Status: AC
  Filled 2018-01-18: qty 50

## 2018-01-18 NOTE — Progress Notes (Signed)
RT contacted Jerrye Bushy, NP about pt's current weaning.  Discussed allowing pt to wean comfortably on a higher pressure support.  PS changed to 12.  Pt tolerating well at this time.

## 2018-01-18 NOTE — Progress Notes (Addendum)
NAME:  Fernando Moyer, MRN:  [id number], DOB:  1933-08-18, LOS: 3 ADMISSION DATE:  01/24/2018, CONSULTATION DATE:  01/23/2018 REFERRING MD:  [facility], CHIEF COMPLAINT:  GI bleeding and SOB  Brief History   84 year old with history of ALS that has a PEG in place and frequent dysphagia who has been declining for the past few weeks.  Patient present to his primary MD's office where he was found to be desaturating on RA.  Patient was sent to the ED.  Of note, patient had a concern with epigastric pain when using PEG tube and a CT was done 9 days PTA there was a concern for aspiration.  [facility] was [facility].  Patient was severely SOB and was intubated in the ED for hypoxemic respiratory failure.  Transferred to [facility] for further management.  Past Medical History  [place] [facility] Events   10/16 intubated for airway protection 10/17: Remains ventilated.  Chest x-ray with bilateral airspace disease.  Continuing antibiotics, GI evaluating for abdominal complaints.  Ordering echocardiogram.  EGD was obtained, no clear source of bleeding identified in the esophagus, stomach or small bowel.  Hemodynamically stable. 10/18: Initiated weaning trial and demonstrated acceptable ventilator mechanics however CBC came back with hemoglobin once again down to 5.8, this had gone up to as high as 7.7 after the transfusion on admission day.  Because of this weaning was held.  10/19: Status post 2 units of blood hemoglobin staying stable at 1.8.  Blood pool study was completed and did not demonstrate evidence of GI bleed had discussed with GI services and plan was to obtain CT abdomen pelvis which will be ordered.  Hemodynamically stable overnight, pushing diuresis  Consults: date of consult/date signed off & final recs:  GI  Procedures (surgical and bedside):  Bronchoscopy 10/16>>>no evidence of bleeding  ETT 10/16>>> PIV 10/16>>>  Significant Diagnostic Tests:  Bronch with  no evidence of blood but purulent material in both lower lobes Echocardiogram 10/17>>> EF is 5 to 70%.  There is mild left ventricular hypertrophy.  Mild mitral stenosis, the left atrium severely dilated, the right atrium is mildly dilated as was the right ventricle with mildly reduced right ventricular pressures.  There was mild Elevation of pulmonary artery pressure at 35 mmHg EGD on 10/17: No evidence of bleeding in esophagus, duodenum, or stomach  Micro Data:  Blood 10/16>>> 1 out of 2 GPC; coag negative staph from RID so likely contamination Urine 10/16>>>neg Sputum 10/16: GPC Nuclear GI blood loss study 10/18: Negative for blood loss CT abdomen pelvis 10/19>>> Antimicrobials:  Vanc 10/16>>>10/18 Zosyn 10/16>>>   Subjective:  Comfortable on vent   Objective   Blood pressure (Abnormal) 124/45, pulse 71, temperature 99 F (37.2 C), resp. rate (Abnormal) 21, height 5\' 7"  (1.702 m), weight 79 kg, SpO2 97 %.    Vent Mode: PRVC FiO2 (%):  [30 %] 30 % Set Rate:  [14 bmp-30 bmp] 14 bmp Vt Set:  [520 mL] 520 mL PEEP:  [5 cmH20] 5 cmH20 Pressure Support:  [5 cmH20] 5 cmH20 Plateau Pressure:  [14 cmH20-18 cmH20] 15 cmH20   Intake/Output Summary (Last 24 hours) at 01/18/2018 0831 Last data filed at 01/18/2018 0600 Gross per 24 hour  Intake 1263.43 ml  Output 2297 ml  Net -1033.57 ml   Filed Weights   01/01/2018 0300 01/17/18 0500 01/18/18 0500  Weight: 80.2 kg 80.1 kg 79 kg    Examination: General: This is an  84 year old white male he is currently resting on full ventilator support and sedated on propofol infusion HEENT normocephalic atraumatic no jugular venous distention he is orally intubated Pulmonary: Coarse scattered rhonchi no accessory use equal chest rise on mechanically assisted breath Cardiac: Regular rate and rhythm without murmur rub or gallop Abdomen: Soft, nontender.  PEG site is unremarkable, positive bowel sounds Extremities: Warm, dry, dependent edema brisk  capillary refill strong pulses Neuro: Sedated on ventilator GU: Clear yellow urine via Foley catheter  Resolved Hospital Problem list   N/A  Assessment & Plan:     Acute Hypoxic respiratory failure in setting of presumed aspiration superimposed on underlying ALS -suspect chronic component given elevated Pulmonary pressures, dilated RV and RA w/ reduced RV fxn.  His portable chest x-ray was personally reviewed: This demonstrates the endotracheal tube in satisfactory position he continues to demonstrate, bibasilar airspace disease comparing prior film the right aeration is worse than left and looks a little worse when comparing to day prior Plan Continue full ventilator support for now  Lasix again today, will escalate dosing to twice daily aiming for negative volume status  Day #4 antibiotics, remains on Zosyn we will continue this for now if sputum comes back negative we will transition to ceftriaxone and complete total 7-day therapy Continue VAP interventions PAD protocol RASS goal 0  Isolated blood culture with gram-positive cocci Plan Continue current therapy this is a contaminant  Fluid and electrolyte imbalance: Hypokalemia Plan Replace and recheck  Abdominal pain: Etiology remains unclear, EGD was completed on the 17th without evidence of esophagitis or gastritis no ulcerative disease identified Plan Continuing PPI via tube Repeating abdominal CT this a.m.  Acute blood loss anemia.  EGD was negative.  Blood pool scan was negative  Hemoglobin dropped to 5.5 yesterday, received another 2 units of packed red blood cells, stable posttransfusion Plan Continue to trend complete blood count  Transfusion trigger less than 7  CT abdomen and pelvis today looking for retroperitoneal bleed  No anticoagulation  Not a candidate for colonoscopy, not sure that he would be surgical candidate if we were to find anything  ALS: Plan Supportive care  Best practice    GI prophylaxis:  PPI DVT prophylaxis: SCDs VAP protocol: started 10/16 PAD protocol: started 10/16 Nutrition: NPO, plan will be to start tube feeds today on 10/19 Activity: bedrest  GOC: LCB with no CPR/cardiversion or trach, short term support only.  Disposition / Summary of Today's Plan 01/18/18   Clinically looks good.  Still appears to have some volume excess.  I think there is more room for diuresis.  Still no answer as to the etiology of his anemia although likely his hemoglobin stable overnight.  We will go ahead with CT abdomen pelvis per discussion with GI service   Labs   CBC: Recent Labs  Lab 01/01/2018 1915 01/22/2018 0100 01/17/18 0511 01/17/18 1911 01/18/18 0431  WBC 17.2* 17.0* 11.3* 10.3 11.0*  NEUTROABS 15.1*  --   --  7.5  --   HGB 7.7* 7.4* 5.8* 8.1* 8.3*  HCT 27.5* 25.9* 20.8* 27.1* 27.1*  MCV 83.1 80.9 81.9 81.1 81.6  PLT 333 344 304 298 119    Basic Metabolic Panel: Recent Labs  Lab 01/07/2018 1357 01/22/2018 1915 01/15/2018 0100 01/17/18 1151 01/18/18 0431  NA 135 138 139 140 143  K 4.1 4.7 4.8 3.4* 3.1*  CL 99 105 106 106 104  CO2 25 24 25 26 26   GLUCOSE 141* 168* 93 93 91  BUN 19 18 19 23 20   CREATININE 0.97 1.13 1.20 1.35* 1.28*  CALCIUM 9.2 8.7* 8.5* 8.2* 8.4*  MG  --  2.3 2.2 2.2  --   PHOS  --  5.0* 4.3 4.0  --    GFR: Estimated Creatinine Clearance: 41.6 mL/min (A) (by C-G formula based on SCr of 1.28 mg/dL (H)). Recent Labs  Lab 01/13/2018 1444 01/26/2018 1915 01/11/2018 0100 01/17/18 0511 01/17/18 1151 01/17/18 1911 01/18/18 0431  PROCALCITON  --  0.10 0.19  --  0.21  --   --   WBC  --  17.2* 17.0* 11.3*  --  10.3 11.0*  LATICACIDVEN 8.80* 3.5* 2.2*  --   --   --   --     Liver Function Tests: Recent Labs  Lab 01/13/2018 1915 01/17/18 1151 01/18/18 0431  AST 25 23 16   ALT 10 7 6   ALKPHOS 82 67 73  BILITOT 2.2* 1.8* 2.1*  PROT 6.4* 5.6* 5.5*  ALBUMIN 3.2* 2.5* 2.5*   Recent Labs  Lab 01/24/2018 1915  LIPASE 36   No results for input(s):  AMMONIA in the last 168 hours.  ABG    Component Value Date/Time   PHART 7.450 01/17/2018 0348   PCO2ART 37.8 01/17/2018 0348   PO2ART 108.0 01/17/2018 0348   HCO3 26.3 01/17/2018 0348   TCO2 27 01/17/2018 0348   ACIDBASEDEF 1.5 01/11/2018 1830   O2SAT 98.0 01/17/2018 0348     Coagulation Profile: Recent Labs  Lab 01/07/2018 1915  INR 1.36    Cardiac Enzymes: Recent Labs  Lab 01/22/2018 1357 01/01/2018 1359  TROPONINI 0.11* 0.18*    HbA1C: Hgb A1c MFr Bld  Date/Time Value Ref Range Status  08/13/2017 02:59 PM 6.3 4.6 - 6.5 % Final    Comment:    Glycemic Control Guidelines for People with Diabetes:Non Diabetic:  <6%Goal of Therapy: <7%Additional Action Suggested:  >8%   08/03/2016 12:15 PM 5.8 4.6 - 6.5 % Final    Comment:    Glycemic Control Guidelines for People with Diabetes:Non Diabetic:  <6%Goal of Therapy: <7%Additional Action Suggested:  >8%     CBG: Recent Labs  Lab 01/17/18 1110 01/17/18 1502 01/17/18 2006 01/18/18 0023 01/18/18 0411  GLUCAP 89 91 71 72 85    Erick Colace ACNP-BC [place] Pager # [phone] OR # [phone] if no answer

## 2018-01-18 NOTE — Progress Notes (Signed)
Pt placed back on full vent support due to increased agitation.  Family and RN at bedside.

## 2018-01-19 ENCOUNTER — Inpatient Hospital Stay (HOSPITAL_COMMUNITY): Payer: Medicare Other

## 2018-01-19 DIAGNOSIS — R1012 Left upper quadrant pain: Secondary | ICD-10-CM

## 2018-01-19 DIAGNOSIS — R69 Illness, unspecified: Secondary | ICD-10-CM

## 2018-01-19 DIAGNOSIS — Z66 Do not resuscitate: Secondary | ICD-10-CM

## 2018-01-19 DIAGNOSIS — D649 Anemia, unspecified: Secondary | ICD-10-CM

## 2018-01-19 LAB — BASIC METABOLIC PANEL
Anion gap: 9 (ref 5–15)
BUN: 16 mg/dL (ref 8–23)
CALCIUM: 8.1 mg/dL — AB (ref 8.9–10.3)
CHLORIDE: 108 mmol/L (ref 98–111)
CO2: 29 mmol/L (ref 22–32)
CREATININE: 1.3 mg/dL — AB (ref 0.61–1.24)
GFR, EST AFRICAN AMERICAN: 57 mL/min — AB (ref >60)
GFR, EST NON AFRICAN AMERICAN: 49 mL/min — AB (ref >60)
Glucose, Bld: 94 mg/dL (ref 70–99)
Potassium: 3.6 mmol/L (ref 3.5–5.1)
Sodium: 146 mmol/L — ABNORMAL HIGH (ref 135–145)

## 2018-01-19 LAB — PHOSPHORUS: PHOSPHORUS: 3.7 mg/dL (ref 2.5–4.6)

## 2018-01-19 LAB — BPAM RBC
Blood Product Expiration Date: 201911142359
Blood Product Expiration Date: 201911142359
Blood Product Expiration Date: 201911152359
Blood Product Expiration Date: 201911152359
ISSUE DATE / TIME: 201910181159
ISSUE DATE / TIME: 201910180913
UNIT TYPE AND RH: 6200
UNIT TYPE AND RH: 6200
UNIT TYPE AND RH: 6200
Unit Type and Rh: 6200

## 2018-01-19 LAB — TYPE AND SCREEN
ABO/RH(D): A POS
ANTIBODY SCREEN: NEGATIVE
UNIT DIVISION: 0
UNIT DIVISION: 0
UNIT DIVISION: 0
Unit division: 0

## 2018-01-19 LAB — GLUCOSE, CAPILLARY
Glucose-Capillary: 78 mg/dL (ref 70–99)
Glucose-Capillary: 86 mg/dL (ref 70–99)
Glucose-Capillary: 116 mg/dL — ABNORMAL HIGH (ref 70–99)

## 2018-01-19 LAB — CBC
HEMATOCRIT: 27.9 % — AB (ref 39.0–52.0)
Hemoglobin: 8 g/dL — ABNORMAL LOW (ref 13.0–17.0)
MCH: 24 pg — ABNORMAL LOW (ref 26.0–34.0)
MCHC: 28.7 g/dL — AB (ref 30.0–36.0)
MCV: 83.5 fL (ref 80.0–100.0)
NRBC: 0 % (ref 0.0–0.2)
PLATELETS: 315 10*3/uL (ref 150–400)
RBC: 3.34 MIL/uL — ABNORMAL LOW (ref 4.22–5.81)
RDW: 16.8 % — AB (ref 11.5–15.5)
WBC: 10.5 10*3/uL (ref 4.0–10.5)

## 2018-01-19 LAB — MAGNESIUM
MAGNESIUM: 2 mg/dL (ref 1.7–2.4)
Magnesium: 2.1 mg/dL (ref 1.7–2.4)

## 2018-01-19 MED ORDER — ACETAMINOPHEN 160 MG/5ML PO SOLN: 650.0000 mg | ORAL | Status: DC | PRN

## 2018-01-19 MED ORDER — MORPHINE SULFATE (PF) 2 MG/ML IV SOLN
2.0000 mg | INTRAVENOUS | Status: DC | PRN
  Administered 2018-01-19: 4 mg via INTRAVENOUS
  Filled 2018-01-19: qty 2

## 2018-01-19 MED ORDER — LORAZEPAM 2 MG/ML IJ SOLN: 1.0000 mg | INTRAMUSCULAR | Status: DC | PRN

## 2018-01-19 MED ORDER — FUROSEMIDE 10 MG/ML IJ SOLN
40.0000 mg | Freq: Once | INTRAMUSCULAR | Status: AC
  Administered 2018-01-19: 40 mg via INTRAVENOUS
  Filled 2018-01-19: qty 4

## 2018-01-19 MED ORDER — ACETAMINOPHEN 650 MG RE SUPP: 650.0000 mg | Freq: Four times a day (QID) | RECTAL | Status: DC | PRN

## 2018-01-19 MED ORDER — LORAZEPAM 2 MG/ML IJ SOLN
0.5000 mg | INTRAMUSCULAR | Status: DC | PRN
  Administered 2018-01-19: 1 mg via INTRAVENOUS
  Filled 2018-01-19: qty 1

## 2018-01-19 MED ORDER — POTASSIUM CHLORIDE 20 MEQ/15ML (10%) PO SOLN
40.0000 meq | Freq: Once | ORAL | Status: AC
  Administered 2018-01-19: 40 meq
  Filled 2018-01-19: qty 30

## 2018-01-19 MED ORDER — VITAL HIGH PROTEIN PO LIQD: 1000.0000 mL | ORAL | Status: DC

## 2018-01-19 MED ORDER — GLYCOPYRROLATE 0.2 MG/ML IJ SOLN
0.2000 mg | INTRAMUSCULAR | Status: DC | PRN
  Administered 2018-01-19 (×2): 0.2 mg via INTRAVENOUS
  Filled 2018-01-19 (×2): qty 1

## 2018-01-19 MED ORDER — SODIUM CHLORIDE 0.9 % IV SOLN
2.0000 g | INTRAVENOUS | Status: DC
  Filled 2018-01-19: qty 20

## 2018-01-19 MED ORDER — ACETAMINOPHEN 325 MG PO TABS: 650.0000 mg | ORAL_TABLET | Freq: Four times a day (QID) | ORAL | Status: DC | PRN

## 2018-01-19 MED ORDER — POTASSIUM CHLORIDE 20 MEQ/15ML (10%) PO SOLN: 40.0000 meq | ORAL | Status: DC

## 2018-01-19 MED ORDER — MORPHINE 100MG IN NS 100ML (1MG/ML) PREMIX INFUSION
1.0000 mg/h | INTRAVENOUS | Status: DC
  Administered 2018-01-19: 6 mg/h via INTRAVENOUS
  Filled 2018-01-19: qty 100

## 2018-01-19 MED ORDER — ONDANSETRON HCL 4 MG/2ML IJ SOLN: 4.0000 mg | Freq: Four times a day (QID) | INTRAMUSCULAR | Status: DC | PRN

## 2018-01-19 MED ORDER — MORPHINE SULFATE (PF) 2 MG/ML IV SOLN
2.0000 mg | INTRAVENOUS | Status: DC | PRN
  Administered 2018-01-19 (×2): 4 mg via INTRAVENOUS
  Filled 2018-01-19 (×2): qty 2

## 2018-01-19 MED ORDER — PRO-STAT SUGAR FREE PO LIQD
30.0000 mL | Freq: Two times a day (BID) | ORAL | Status: DC
  Administered 2018-01-19: 30 mL
  Filled 2018-01-19: qty 30

## 2018-01-19 MED ORDER — POLYVINYL ALCOHOL 1.4 % OP SOLN
1.0000 [drp] | Freq: Four times a day (QID) | OPHTHALMIC | Status: DC | PRN
  Filled 2018-01-19: qty 15

## 2018-01-19 MED ORDER — FREE WATER: 200.0000 mL | Freq: Three times a day (TID) | Status: DC

## 2018-01-19 MED ORDER — BIOTENE DRY MOUTH MT LIQD: 15.0000 mL | OROMUCOSAL | Status: DC | PRN

## 2018-01-19 MED ORDER — IBUPROFEN 100 MG/5ML PO SUSP
600.0000 mg | Freq: Three times a day (TID) | ORAL | Status: DC | PRN
  Administered 2018-01-19: 600 mg
  Filled 2018-01-19 (×2): qty 30

## 2018-01-19 MED ORDER — LIDOCAINE 5 % EX PTCH: 1.0000 | MEDICATED_PATCH | CUTANEOUS | Status: DC

## 2018-01-19 NOTE — Procedures (Signed)
Extubation Procedure Note  Patient Details:   Name: Fernando Moyer DOB: Jan 16, 1934 MRN: [id number]   Airway Documentation:    Vent end date: 01/19/18 Vent end time: 0908   Evaluation  O2 sats: stable throughout Complications: No apparent complications Patient did tolerate procedure well. Bilateral Breath Sounds: Rhonchi   No - baseline.    Positive cuff leak noted.  Pt placed on [place] 4 L with humidity, no stridor noted.  Pt has a weak cough, but unable to use incentive spirometer at this time.  [name] 01/19/2018, 9:29 AM

## 2018-01-19 NOTE — Progress Notes (Signed)
NAME:  Fernando Moyer, MRN:  [id number], DOB:  [birth year], LOS: 4 ADMISSION DATE:  01/23/2018, CONSULTATION DATE:  01/03/2018 REFERRING MD:  [facility], CHIEF COMPLAINT:  GI bleeding and SOB  Brief History   84 year old with history of ALS that has a PEG in place and frequent dysphagia who has been declining for the past few weeks.  Patient present to his primary MD's office where he was found to be desaturating on RA.  Patient was sent to the ED.  Of note, patient had a concern with epigastric pain when using PEG tube and a CT was done 9 days PTA there was a concern for aspiration.  Patient was intubated in [facility].  Patient was severely SOB and was intubated in the ED for hypoxemic respiratory failure.  Transferred to [facility] for further management.  Past Medical History  [facility] Events   10/16 intubated for airway protection 10/17: Remains ventilated.  Chest x-ray with bilateral airspace disease.  Continuing antibiotics, GI evaluating for abdominal complaints.  Ordering echocardiogram.  EGD was obtained, no clear source of bleeding identified in the esophagus, stomach or small bowel.  Hemodynamically stable. 10/18: Initiated weaning trial and demonstrated acceptable ventilator mechanics however CBC came back with hemoglobin once again down to 5.8, this had gone up to as high as 7.7 after the transfusion on admission day.  Because of this weaning was held.  10/19: Status post 2 units of blood hemoglobin staying stable at 1.8.  Blood pool study was completed and did not demonstrate evidence of GI bleed had discussed with GI services and plan was to obtain CT abdomen pelvis which will be ordered.  Hemodynamically stable overnight, pushing diuresis 10/20: Hemoglobin stable for the last 72 hours.  Attempting spontaneous breathing trial with acceptable F/VT so extubated.  Ongoing diuresis  Consults: date of consult/date signed off & final recs:  GI  Procedures  (surgical and bedside):  Bronchoscopy 10/16>>>no evidence of bleeding  ETT 10/16>>> PIV 10/16>>>  Significant Diagnostic Tests:  Bronch with no evidence of blood but purulent material in both lower lobes Echocardiogram 10/17>>> EF is 5 to 70%.  There is mild left ventricular hypertrophy.  Mild mitral stenosis, the left atrium severely dilated, the right atrium is mildly dilated as was the right ventricle with mildly reduced right ventricular pressures.  There was mild Elevation of pulmonary artery pressure at 35 mmHg EGD on 10/17: No evidence of bleeding in esophagus, duodenum, or stomach  Micro Data:  Blood 10/16>>> 1 out of 2 GPC; coag negative staph from RID so likely contamination Urine 10/16>>>neg Sputum 10/16: Mickel Baas Nuclear GI blood loss study 10/18: Negative for blood loss CT abdomen pelvis 10/19>>> negative for source of bleeding Antimicrobials:  Vanc 10/16>>>10/18 Zosyn 10/16>>> 10/20 Ceftriaxone 10/20>>>  Subjective:  Denies pain or shortness of breath on spontaneous breathing trial once tube out Objective   Blood pressure (Abnormal) 153/66, pulse 100, temperature 99.7 F (37.6 C), temperature source Core, resp. rate (Abnormal) 27, height 5\' 7"  (1.702 m), weight 77 kg, SpO2 97 %.    Vent Mode: PSV;CPAP FiO2 (%):  [30 %] 30 % Set Rate:  [14 bmp] 14 bmp Vt Set:  [520 mL] 520 mL PEEP:  [5 cmH20] 5 cmH20 Pressure Support:  [5 HEN27-78 cmH20] 5 cmH20 Plateau Pressure:  [13 cmH20-25 cmH20] 13 cmH20   Intake/Output Summary (Last 24 hours) at 01/19/2018 0905 Last data filed at 01/19/2018 0900 Gross per 24 hour  Intake 661.94 ml  Output 3802 ml  Net -3140.06 ml   Filed Weights   01/17/18 0500 01/18/18 0500 01/19/18 0500  Weight: 80.1 kg 79 kg 77 kg    Examination: General: 84 year old white male currently on spontaneous breathing trial coughing, but having acceptable tidal volumes HEENT normocephalic atraumatic no jugular venous distention mucous membranes moist  orally intubated Pulmonary: Coarse scattered rhonchi diminished bases tidal volumes in the 3-400 range with a rate 28-30 no accessory use beyond baseline Cardiac: Mild tachycardia no murmur rub or gallop Abdomen: Soft nontender no organomegaly Extremities: Warm dry trace lower extremity edema Neuro: Awake, interactive, intermittently following commands GU: Clear yellow urine  Resolved Hospital Problem list   N/A  Assessment & Plan:     Acute Hypoxic respiratory failure in setting of presumed aspiration superimposed on underlying ALS -suspect chronic component given elevated Pulmonary pressures, dilated RV and RA w/ reduced RV fxn.  -Passed spontaneous breathing trial Plan Extubate  Wean oxygen for saturations greater than 90%  BiPAP mandatory at at bedtime and for rest  Change Lasix back to daily with sodium rising  Day #5 antibiotics, will change to ceftriaxone and complete 2 more days  DC sedating medications  Out of bed when able   Fluid and electrolyte imbalance: Mild hypernatremia Plan Hold Lasix today Gentle free water via tube A.m. Chemistry  Abdominal pain: Etiology remains unclear, EGD was completed on the 17th without evidence of esophagitis or gastritis no ulcerative disease identified, CT was negative Plan Continue twice daily PPI Initiating tube feeds at trickle feed later this afternoon  Acute blood loss anemia.  EGD was negative.  Blood pool scan was negative  Hemoglobin is been stable for 72 hours source not clear Plan Trend complete blood count  Transfuse for hemoglobin less than 7  No anticoagulation   ALS: Plan Supportive care Best practice    GI prophylaxis: PPI DVT prophylaxis: SCDs VAP protocol: started 10/16 discontinued on 10/20 PAD protocol: started 10/16 discontinued on 10/20 Nutrition: NPO, plan will be to start tube feeds today  Activity: Advance GOC: Full DO NOT RESUSCITATE should he arrest  Disposition / Summary of Today's Plan  01/19/18   Passed a spontaneous breathing trial so extubated Continue supportive care   Labs   CBC: Recent Labs  Lab 01/07/2018 1915 01/06/2018 0100 01/17/18 0511 01/17/18 1911 01/18/18 0431 01/19/18 0416  WBC 17.2* 17.0* 11.3* 10.3 11.0* 10.5  NEUTROABS 15.1*  --   --  7.5  --   --   HGB 7.7* 7.4* 5.8* 8.1* 8.3* 8.0*  HCT 27.5* 25.9* 20.8* 27.1* 27.1* 27.9*  MCV 83.1 80.9 81.9 81.1 81.6 83.5  PLT 333 344 304 298 301 409    Basic Metabolic Panel: Recent Labs  Lab 01/21/2018 1915 01/12/2018 0100 01/17/18 1151 01/18/18 0431 01/19/18 0416  NA 138 139 140 143 146*  K 4.7 4.8 3.4* 3.1* 3.6  CL 105 106 106 104 108  CO2 24 25 26 26 29   GLUCOSE 168* 93 93 91 94  BUN 18 19 23 20 16   CREATININE 1.13 1.20 1.35* 1.28* 1.30*  CALCIUM 8.7* 8.5* 8.2* 8.4* 8.1*  MG 2.3 2.2 2.2  --  2.0  PHOS 5.0* 4.3 4.0  --   --    GFR: Estimated Creatinine Clearance: 41 mL/min (A) (by C-G formula based on SCr of 1.3 mg/dL (H)). Recent Labs  Lab 01/19/2018 1444 01/25/2018 1915 01/12/2018 0100 01/17/18 7353 01/17/18 1151 01/17/18 1911 01/18/18 0431 01/19/18 2992  PROCALCITON  --  0.10 0.19  --  0.21  --   --   --   WBC  --  17.2* 17.0* 11.3*  --  10.3 11.0* 10.5  LATICACIDVEN 8.80* 3.5* 2.2*  --   --   --   --   --     Liver Function Tests: Recent Labs  Lab 12/31/2017 1915 01/17/18 1151 01/18/18 0431  AST 25 23 16   ALT 10 7 6   ALKPHOS 82 67 73  BILITOT 2.2* 1.8* 2.1*  PROT 6.4* 5.6* 5.5*  ALBUMIN 3.2* 2.5* 2.5*   Recent Labs  Lab 01/23/2018 1915  LIPASE 36   No results for input(s): AMMONIA in the last 168 hours.  ABG    Component Value Date/Time   PHART 7.450 01/17/2018 0348   PCO2ART 37.8 01/17/2018 0348   PO2ART 108.0 01/17/2018 0348   HCO3 26.3 01/17/2018 0348   TCO2 27 01/17/2018 0348   ACIDBASEDEF 1.5 01/09/2018 1830   O2SAT 98.0 01/17/2018 0348     Coagulation Profile: Recent Labs  Lab 01/01/2018 1915  INR 1.36    Cardiac Enzymes: Recent Labs  Lab 01/12/2018 1357  01/16/18 1359  TROPONINI 0.11* 0.18*    HbA1C: Hgb A1c MFr Bld  Date/Time Value Ref Range Status  08/13/2017 02:59 PM 6.3 4.6 - 6.5 % Final    Comment:    Glycemic Control Guidelines for People with Diabetes:Non Diabetic:  <6%Goal of Therapy: <7%Additional Action Suggested:  >8%   08/03/2016 12:15 PM 5.8 4.6 - 6.5 % Final    Comment:    Glycemic Control Guidelines for People with Diabetes:Non Diabetic:  <6%Goal of Therapy: <7%Additional Action Suggested:  >8%     CBG: Recent Labs  Lab 01/18/18 1223 01/18/18 1633 01/18/18 2008 01/19/18 0422 01/19/18 0804  GLUCAP 118* 110* 97 78 86    Erick Colace ACNP-BC [place] Pager # [phone] OR # [phone] if no answer

## 2018-01-19 NOTE — Progress Notes (Addendum)
84 year old with ALS and dysphagia who presented 10/16 with respiratory distress and hypoxia and required mechanical ventilation. Of note he was complaining of epigastric pain and recent CT chest from 10/7 showed right lower lobe infiltrate concerning for aspiration. There was some concern for hemoptysis but bronchoscopy was nondiagnostic.  He was found to be anemic, was transfused 2 units PRBC with improvement globin to 7.4 but dropped again to 5.6, On exam-elderly man, chronically ill-appearing, tolerated weaning on pressure support 5/5, he was extubated, no stridor, S1-S2 normal, soft nontender abdomen, no tenderness on left rib cage, no flank ecchymosis.  Chest x-ray reviewed which shows bibasilar airspace disease, no effusions Labs show mild hyponatremia, stable creatinine, stable hemoglobin posttransfusion.  Impression/plan Acute respiratory failure due to aspiration pneumonia-extubated, based on discussion with family DNR issued, no reintubation Zosyn for 5 days, can discontinue  Anemia-no evidence of blood loss  EGD was negative as well as bleeding scan .  CT negative for retroperitoneal bleed Haptoglobin normal, so no evidence of hemolysis, reticulocyte count borderline low?  Decreased production  Persists with pain left upper quadrant-use no narcotics assist Tylenol, Lidoderm patch Full DNR from here on Will involve palliative care to outline further goals. Transfer to [facility] 10/21  My critical care time x 62m   Addendum -patient indicated to family that he does not want oxygen and would like to go to [facility].  After discussion with family, will proceed with full comfort care Discontinue feeding and antibiotics and labs Use morphine as needed for respiratory distress or pain. He can transition to floor  Rakesh V. Elsworth Soho MD 11:11 AM

## 2018-01-19 NOTE — Consult Note (Addendum)
Consultation Note Date: 01/19/2018   Patient Name: Fernando Moyer  DOB: Aug 28, 1933  MRN: [id number]  Age / Sex: 84 y.o., male  PCP: Colon Branch, MD Referring Physician: Rush Farmer, MD  Reason for Consultation: Establishing goals of care  HPI/Patient Profile: 84 y.o. male  admitted on 01/12/2018 after being sent from PCP office.  He has a past medical history significant for ALS with PEG in place, dysphagia, nominal aortic aneurysm, aortic stenosis, cataracts, chronic back pain, COPD, coronary artery disease, diabetes, GERD, hyperlipidemia, mitral stenosis, MI, peripheral neuropathy, PVD, stricture and stenosis of esophagus, and AAA.  Patient was sent from PCP office after found to be desaturating on room air.  He underwent a CT on 10/7 due to epigastric pain when using PEG tube and concerns for possible aspiration.  His ED course at [facility] patient became anxious, distressed, significant increase in work of breathing, and tachycardic requiring emergency intubation.  Patient was later transferred to [facility] for further care.  Since admission patient was found to be anemic and received 2 units packed red blood cells with hemoglobin improved to 7.4 but later dropped to 5.6.  He continued on mechanical ventilation.  Chest x-ray showed bibasilar airspace disease with no effusions.  He received Zosyn antibiotics and been followed by GI.  He underwent an EGD which was negative for bleeding.  Patient was extubated on 1020 at family's request with the goal of no reintubation and CODE STATUS changed to DNR/DNI.  Palliative medicine team consulted for goals of care as family is requesting to shift care to for comfort after patient expressed to family he was ready to go to heaven.  Clinical Assessment and Goals of Care: I have reviewed medical records including lab results, imaging, Epic notes, and MAR,  received report from the bedside RN, and assessed the patient. I then met at the bedside with Corie Chiquito (granddaughter/POA) and numerous other family members including his 2 sons, grandchildren, and brother to discuss diagnosis prognosis, [place], EOL wishes, disposition and options.  She has now been extubated however continues to be lethargic with minimal response.  He will open eyes when name is called but slowly closes them.  Unable to follow commands.  Patient unable to participate in goals of care discussion.  I introduced Palliative Medicine as specialized medical care for people living with serious illness. It focuses on providing relief from the symptoms and stress of a serious illness. The goal is to improve quality of life for both the patient and the family.  We discussed a brief life review of the patient.  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  He has 2 sons who he was very close with along with his grandchildren.  His granddaughter Lenna Sciara was his primary caregiver.  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  His wife passed away several years ago.  As far  as functional and nutritional status his granddaughter states patient has continued to show signs of decline since diagnosed with ALS in early April. She reports his continued to drastically decline. He had PEG placed in August which provided some comfort and stability and he later continued to decline. She reports he had began complaining of constant pain and discomfort around 9/24 and never bounced back from whatever was going on. She reports prior to his diagnosis he was a fun easy going man who was able to care for himself.   We discussed his current illness and what it means in the larger context of his on-going co-morbidities.  Natural disease trajectory and expectations at EOL were discussed.  Family  reports that patient would not want to live on any forms of life-sustaining measures as he was a big applicator for quality of life.  Granddaughter reports patient tried to write out to her paper while intubated that he wanted off of the machine and to allow him to pass away peacefully.  She was tearful during her discussion.  She showed me the paper where he attempted to write his wishes.  Granddaughter states she and the rest of the family is at peace with her decision to extubate and allow him end-of-life care.  Family was tearful and states they would not want him to continue to suffer.  I attempted to elicit values and goals of care important to the patient.    The difference between aggressive medical intervention and comfort care was considered in light of the patient's goals of care.  At this time family has decided to shift all care to for comfort.  I educated family on what comfort care measures looked like and they are aware the patient will continue to receive great care however the goal of care will be shifted to focus mainly on keeping patient comfortable in managing any symptoms he may experience that would provide any type of discomfort during his end of life life process.  I discussed with family that the nursing staff will continue to monitor and assess patient frequently for signs of discomfort, pain, shortness of breath, agitation, anxiety, nausea etc. and if any symptoms are observed staff will treat accordingly.  I also educated family if patient shows continue with signs of pain or shortness of breath it may be necessary to initiate a morphine drip to better manage his symptoms.  Family verbalized understanding and at this time would like to attempt to continue with as needed medication however they are open to initiate knee drip if patient this can show signs of continuous distress not manageable by PRN dosing.  Family verbalized understanding and appreciation of all the care he has  been receiving and support from medical staff. Melissa, POA tearfully expressed "this is what he would want and he has taken great care of all of [place] over the years we deserve to give him the care and respect he needs."   Family confirms DNR/DNI.  Family educated and made aware to anticipate hospital death. They verbalized understanding and expressed they will be contacting other family members to come visit with anticipation that he would most likely pass away soon.   Questions and concerns were addressed. The family was encouraged to call with questions or concerns.  PMT will continue to support holistically.  Primary Decision Maker:  HCPOA/GRANDDAUGHTER: MELISSA Antonetti    SUMMARY OF RECOMMENDATIONS    DNR/DNI-as confirmed by POA  Patient has been terminally extubated and care is now  shifted to for comfort measures at request of POA and other family members.  Family aware to anticipate hospital death.  Tylenol PRN for mild pain/fever  Robinul PRN for excessive secretions  Ativan as needed as needed for anxiety/seizure activity  Morphine as needed for pain and shortness of breath (I did discuss with family if patient shows increased signs of pain or shortness of breath he may require a continuous morphine drip.  At this time they would like to continue with medication as needed but is open to drip if patient shows signs of distress).  Zofran as needed for nausea  Liquifilm Tears as needed for dry eyes  Comfort cart for family  Patient may transfer to [address] for end-of-life care if stable for transfer or bed as needed.  Family has declined chaplain services at this time however they are aware that if they later require spiritual support they may notify nursing staff at any time.  Palliative medicine team will continue to support patient, patient's family, and medical team during hospitalization.  Code Status/Advance Care Planning:  DNR    Symptom Management:   Tylenol PRN  for mild pain/fever  Robinul PRN for excessive secretions  Ativan as needed as needed for anxiety/seizure activity  Morphine as needed for pain and shortness of breath  Zofran as needed for nausea  Liquifilm Tears as needed for dry eyes  Palliative Prophylaxis:   Frequent Pain Assessment and Oral Care  Additional Recommendations (Limitations, Scope, Preferences):  Full Comfort Care  Psycho-social/Spiritual:   Desire for further Chaplaincy support:no  Prognosis:   Hours - Days-the setting of terminal extubation, for comfort care-actively dying, ALS, acute respiratory failure with hypoxemia, CAD, hypertension, hyperlipidemia, AAA, PVD, aspiration, and dysphagia with PEG tube.  Discharge Planning: Anticipated Hospital Death      Primary Diagnoses: Present on Admission: . Acute respiratory failure with hypoxemia ([place])   I have reviewed the medical record, interviewed the patient and family, and examined the patient. The following aspects are pertinent.  Past Medical History:  Diagnosis Date  . AAA (abdominal aortic aneurysm) ([place]) 05/2008   PER CARDIAC CATH 3.5 X 3. 5 CM  . ALS (amyotrophic lateral sclerosis) ([place])   . Aortic stenosis 05/2008   PER CARDIAC CATH, MILD, MITRAL STENOSIS  . Benign neoplasm of colon   . Bowen's disease    right arm BX: referred to dermatology  . Carotid artery occlusion    last u/s 3-11...Marland Kitchenper vasc.surgery 2007 cath: nonobstructive CAD. mil;d MS, trivial AoS, small AAA,  . Cataract    RIGHT removed  . Chronic back pain   . COPD (chronic obstructive pulmonary disease) ([place])   . Coronary artery disease 05/2008   MILD, MEDICAL MANAGEMENT  . Diabetes mellitus ([place]) 09/07/2011   no per pt- no medications  . Dyspnea    with exertion only  . GERD with stricture    w/hx of stricture  . History of nuclear stress test    Myoview 6/18: EF 51, normal perfusion; Low Risk  . Hyperlipidemia    takes Pravastatin daily  . Mitral stenosis 05/2008    PER CARDIAC CATH  . Myocardial infarction ([place]) 2012  . Peripheral neuropathy    right thigh; "it's been that way for years"  . Pulmonary hypertension ([place])   . PVD (peripheral vascular disease) ([place]) 12/07   per cath 12/07....iliac  . Stricture and stenosis of esophagus    Social History   Socioeconomic History  . Marital status: Widowed  Spouse name: Not on file  . Number of children: 2  . Years of education: Not on file  . Highest education level: Not on file  Occupational History  . 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 Financial resource strain: Not on file  . Food insecurity:    Worry: Not on file    Inability: Not on file  . Transportation needs:    Medical: Not on file    Non-medical: Not on file  Tobacco Use  . Smoking status: Former Smoker    Packs/day: 2.00    Years: 30.00    Pack years: 60.00    Types: Cigarettes    Last attempt to quit: 04/03/1987    Years since quitting: 30.8  . Smokeless tobacco: Never Used  . Tobacco comment: quit smoking in 1989  Substance and Sexual Activity  . Alcohol use: No  . Drug use: No  . Sexual activity: Not on file  Lifestyle  . Physical activity:    Days per week: Not on file    Minutes per session: Not on file  . Stress: Not on file  Relationships  . Social connections:    Talks on phone: Not on file    Gets together: Not on file    Attends religious service: Not on file    Active member of club or organization: Not on file    Attends meetings of clubs or organizations: Not on file    Relationship status: Not on file  Other Topics Concern  . Not on file  Social History Narrative   LIVES BY HIMSELF    HAS 2 GROWN SONS, 5 G-KIDS    Melissa is her granddaughter, she is a Art therapist for the school system, usually comes with him for his appointments          Family History  Problem Relation Age of Onset  . Heart  attack Father 31       MI  . Prostate cancer Brother 76  . Colon cancer Neg Hx   . Anesthesia problems Neg Hx   . Hypotension Neg Hx   . Malignant hyperthermia Neg Hx   . Pseudochol deficiency Neg Hx   . Stomach cancer Neg Hx   . Esophageal cancer Neg Hx   . Rectal cancer Neg Hx   . Pancreatic cancer Neg Hx    Scheduled Meds: . lidocaine  1 patch Transdermal Q24H   Continuous Infusions: . sodium chloride     PRN Meds:.acetaminophen **OR** acetaminophen, antiseptic oral rinse, glycopyrrolate, LORazepam, morphine injection, ondansetron (ZOFRAN) IV, polyvinyl alcohol, sodium chloride flush Medications Prior to Admission:  Prior to Admission medications   Medication Sig Start Date End Date Taking? Authorizing Provider  acetaminophen (TYLENOL) 500 MG tablet Take 1,000 mg by mouth every 6 (six) hours as needed (pain). Reported on 04/21/2015   Yes [provider]  ELIQUIS 5 MG TABS tablet TAKE 1 TABLET TWICE A DAY 08/13/17  Yes [name], MD  furosemide (LASIX) 40 MG tablet TAKE 1 TABLET DAILY 08/13/17  Yes [name], MD  KLOR-CON M20 20 MEQ tablet TAKE 1 TABLET DAILY 08/13/17  Yes [name], MD  omeprazole (PRILOSEC) 40 MG capsule Take 1 capsule (40 mg total) by mouth 2 (two) times daily. 12/16/17  Yes Paz, Alda Berthold, MD  polyethylene glycol [facility] / Floria Raveling) packet Take 17 g by mouth  2 (two) times daily.    Yes [provider]  tiotropium (SPIRIVA HANDIHALER) 18 MCG inhalation capsule Place 1 capsule (18 mcg total) into inhaler and inhale daily. Patient taking differently: Place 1 capsule into inhaler and inhale 2 (two) times daily.  08/29/17  Yes Paz, Alda Berthold, MD  ferrous sulfate (FERROUSUL) 325 (65 FE) MG tablet Take 1 tablet (325 mg total) by mouth 2 (two) times daily with a meal. Patient not taking: Reported on 01/07/2018 08/03/16   Colon Branch, MD  pravastatin (PRAVACHOL) 40 MG tablet Take 1 tablet (40 mg total) by mouth daily. Patient not taking: Reported  on 01/02/2018 06/03/17   Colon Branch, MD  traMADol (ULTRAM) 50 MG tablet Take 1 tablet (50 mg total) by mouth every 12 (twelve) hours as needed for moderate pain. for pain 01/10/18   Colon Branch, MD  UNABLE TO FIND Check CMP monthly x 2 months. Fax results to dr caress at [facility] [phone]) 07/17/17   [provider]  [place] referral for ALS and pain management.  Dx. ALS G12.21. Last MD eval: 07/17/2017.  [facility] [place] [phone]. 07/18/17   [provider]   No Known Allergies Review of Systems  Unable to perform ROS: Patient unresponsive    Physical Exam  Constitutional: He has a sickly appearance.  Chronically ill appearing, actively dying   Cardiovascular: Normal heart sounds. An irregular rhythm present. Tachycardia present. Exam reveals decreased pulses.  Pulmonary/Chest: Accessory muscle usage present. He has decreased breath sounds. He has rhonchi.  Neurological: He is unresponsive.  Will open eyes when name called, no response   Skin: Skin is warm, dry and intact. Bruising noted.  Psychiatric: Cognition and memory are impaired. He expresses inappropriate judgment.  Nursing note and vitals reviewed.   Vital Signs: BP 137/63   Pulse (!) 110   Temp 98.8 F (37.1 C)   Resp (!) 21   Ht 5' 7"  (1.702 m)   Wt 77 kg   SpO2 (!) 80%   BMI 26.59 kg/m  Pain Scale: CPOT   Pain Score: 5    SpO2: SpO2: (!) 80 % O2 Device:SpO2: (!) 80 % O2 Flow Rate: .O2 Flow Rate (L/min): 4 L/min  IO: Intake/output summary:   Intake/Output Summary (Last 24 hours) at 01/19/2018 1232 Last data filed at 01/19/2018 1200 Gross per 24 hour  Intake 629.97 ml  Output 3147 ml  Net -2517.03 ml    LBM:   Baseline Weight: Weight: 78.6 kg Most recent weight: Weight: 77 kg     Palliative Assessment/Data: PPS 10%   Time In: 1115 Time Out: 1245 Time Total: 90 min   Greater than 50%  of this time was  spent counseling and coordinating care related to the above assessment and plan.  Signed by:  Alda Lea, AGPCNP-BC Palliative Medicine Team  Phone: [phone] Fax: [phone] Pager: [phone] Amion: Bjorn Pippin    Please contact Palliative Medicine Team phone at [phone] for questions and concerns.  For individual provider: See Shea Evans

## 2018-01-19 NOTE — Progress Notes (Signed)
RN called into pt's room because he continued to remove his O2 per the family. Pt educated on importance of O2 but continued to refuse it. Pt clearly mouthed that he is ready to die. Family is accepting and supportive of pt's decision. MD made aware of current situation and went to bedside to confirm pt's status.

## 2018-01-20 ENCOUNTER — Encounter (HOSPITAL_COMMUNITY): Payer: Self-pay | Admitting: Internal Medicine

## 2018-01-20 LAB — CULTURE, BLOOD (ROUTINE X 2)
CULTURE: NO GROWTH
CULTURE: NO GROWTH
Culture: NO GROWTH
SPECIAL REQUESTS: ADEQUATE
Special Requests: ADEQUATE

## 2018-01-21 ENCOUNTER — Telehealth: Payer: Self-pay

## 2018-01-21 NOTE — Telephone Encounter (Signed)
On 01/21/18 I received a d/c from [employer] ([employer])   The d/c is for burial.  The patient is a patient of Doctor Elsworth Soho.   D/C will be taken to [facility] for signature.  On 01/22/18 I received the d/c back from Doctor Elsworth Soho.  I got the d/c ready and called the funeral home to let them know the d/c is ready for pickup.

## 2018-01-31 NOTE — Progress Notes (Signed)
Pt asystole on heart monitor, pt's granddaughter Melissa at bedside. Pt pronounced at 8316 by myself Deboraha Sprang, RN and Karene Fry.

## 2018-01-31 NOTE — Progress Notes (Signed)
Morphine gtt 63mls wasted, witnessed by myself Deboraha Sprang, RN and Deloria Lair RN.

## 2018-01-31 DEATH — deceased

## 2018-02-14 ENCOUNTER — Ambulatory Visit: Payer: Medicare Other | Admitting: Internal Medicine

## 2018-03-02 NOTE — Discharge Summary (Signed)
84 year old with ALS and dysphagia who presented 10/16 with respiratory distress and hypoxia and required mechanical ventilation. Of note he was complaining of epigastric pain and recent CT chest from 10/7 showed right lower lobe infiltrate concerning for aspiration. There was some concern for hemoptysis but bronchoscopy was nondiagnostic.  He was found to be anemic, was transfused 2 units PRBC with improvement globin to 7.4 but dropped again to 5.6   Chest showed bibasilar airspace disease, no effusions Labs show mild hyponatremia, stable creatinine, stable hemoglobin posttransfusion.  COURSE - Acute respiratory failure due to aspiration pneumonia-extubated, based on discussion with family DNR issued, no reintubation Zosyn for 5 days, can discontinue  Anemia-no evidence of blood loss  EGD was negative as well as bleeding scan .  CT negative for retroperitoneal bleed Haptoglobin normal, so no evidence of hemolysis, reticulocyte count borderline low?  Decreased production  He was extubated 10/20 but continues to struggle with his breathing.  He was transitioned to full comfort care and passed away soon after.   Cause of death-acute respiratory failure, aspiration pneumonia, ALS  Rakesh V. Elsworth Soho MD 9:35 AM

## 2019-09-22 IMAGING — DX DG CHEST 1V PORT
1 series · 1 of 1 positions shown · non-contrast
Comparison: Radiographs July 04, 2016.

CLINICAL DATA: Chest pain, shortness of breath.

EXAM:
PORTABLE CHEST 1 VIEW

[chest ap]
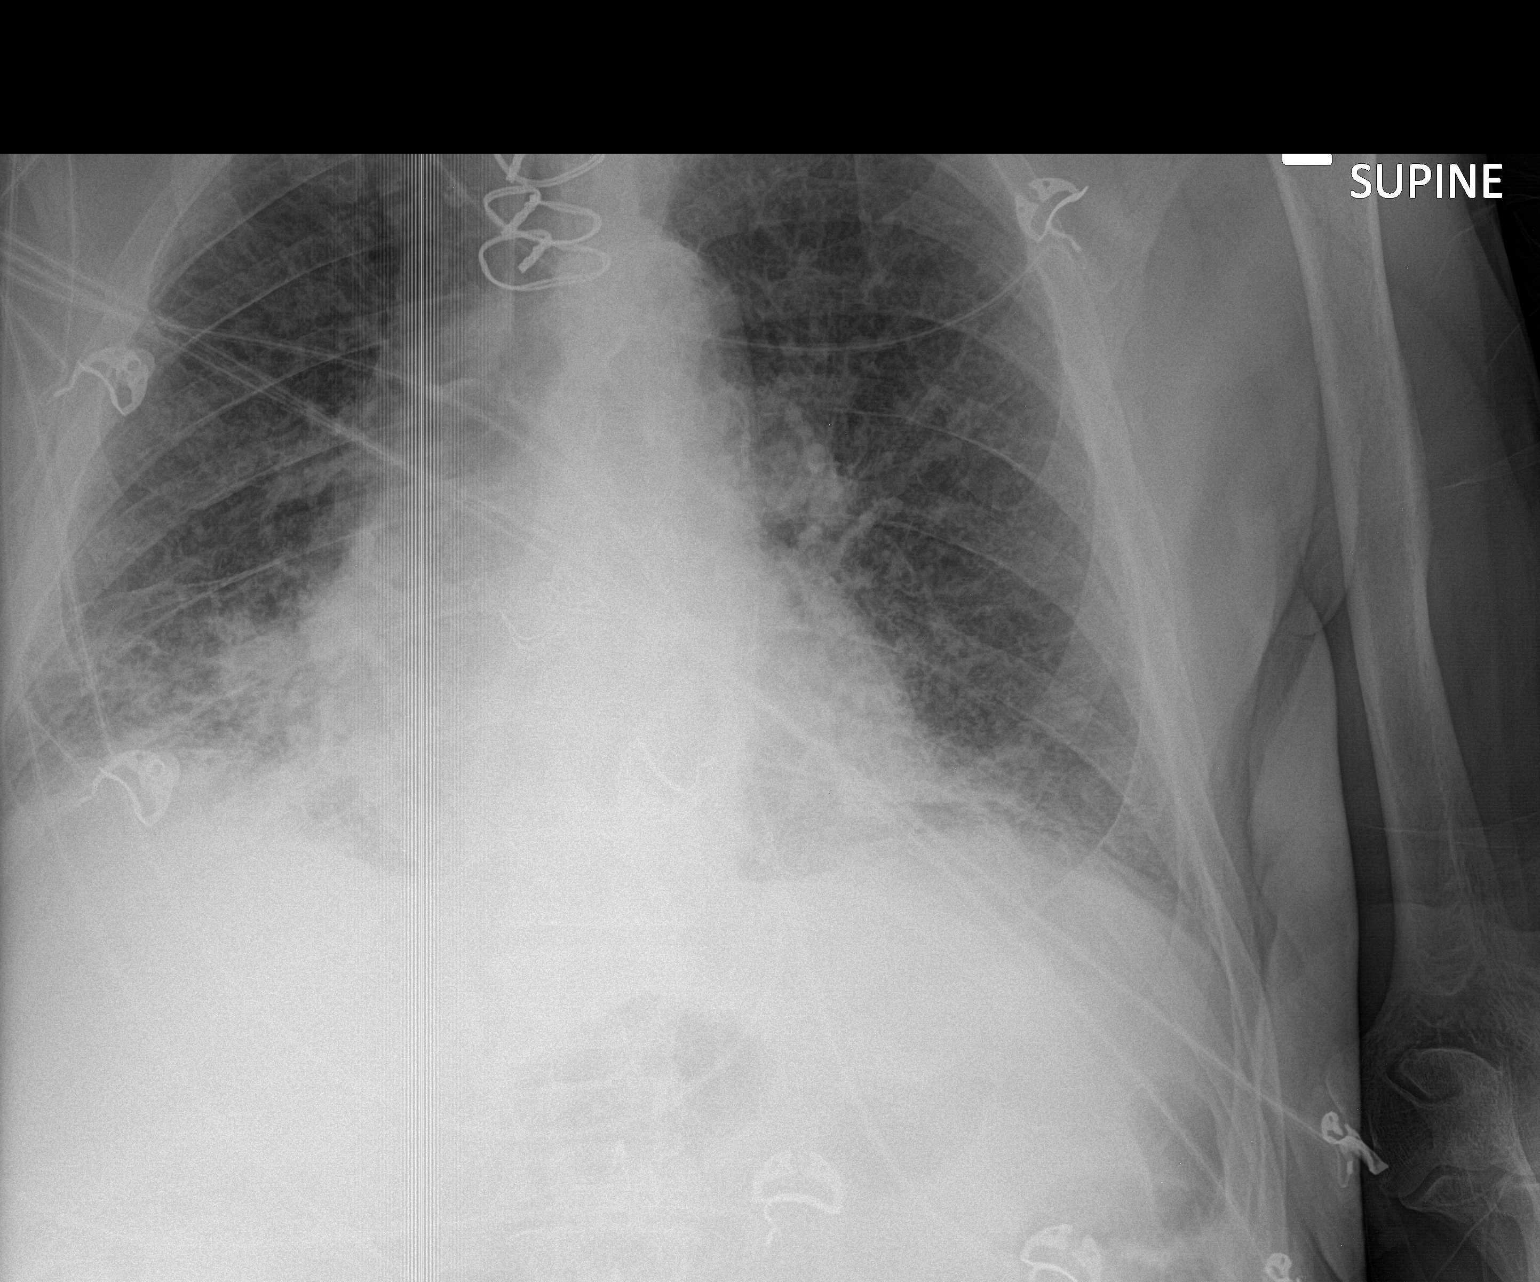

[1 of 1 positions shown; findings below may reference images not displayed]

FINDINGS: Stable cardiomediastinal silhouette. Minimal left basilar
subsegmental atelectasis is noted. Mild right basilar opacity is
noted concerning for pneumonia or atelectasis. Small pleural
effusion cannot be excluded. No definite pneumothorax is noted. Bony
thorax is unremarkable.
IMPRESSION: Minimal left basilar subsegmental atelectasis. Mild right basilar
atelectasis or pneumonia. Followup PA and lateral chest X-ray is
recommended in 3-4 weeks following trial of antibiotic therapy to
ensure resolution and exclude underlying malignancy.

## 2019-09-23 IMAGING — DX DG CHEST 1V PORT
1 series · 1 of 1 positions shown · non-contrast
Comparison: 01/15/2018

CLINICAL DATA: Respiratory failure.

EXAM:
PORTABLE CHEST 1 VIEW

[chest]
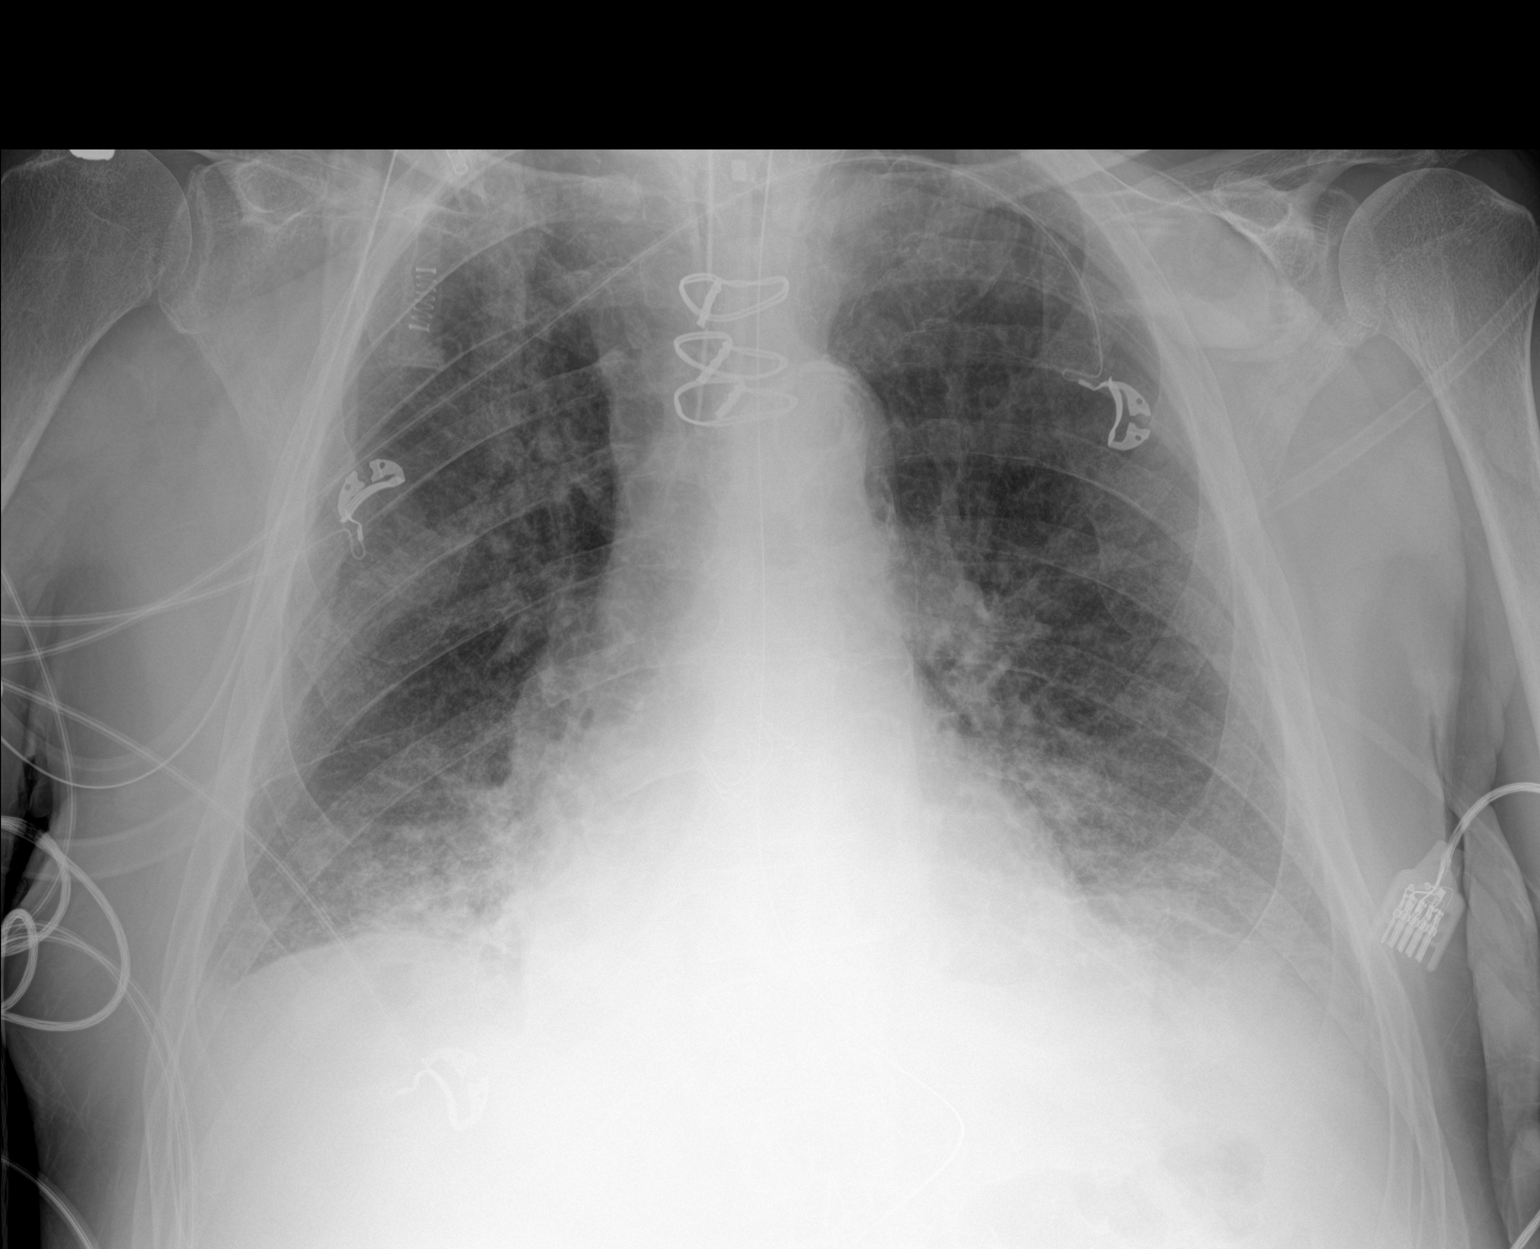

[1 of 1 positions shown; findings below may reference images not displayed]

FINDINGS: Endotracheal tube in good position.  NG tube in the stomach.

Bibasilar airspace disease unchanged. Small bilateral effusions have
progressed. Pulmonary vascular congestion raising the possibility of
fluid overload.
IMPRESSION: Bibasilar atelectasis/infiltrate unchanged. Progression of small
effusions and vascular congestion. Question fluid overload. Question
pneumonia

## 2019-09-26 IMAGING — DX DG CHEST 1V PORT
1 series · 2 of 2 positions shown · non-contrast
Comparison: One-view chest x-ray 01/18/2018

CLINICAL DATA: Pneumonia.

EXAM:
PORTABLE CHEST 1 VIEW

[Series 1: chest · 0.14mm/px · 2 of 2 slices shown]
[im 1/2]
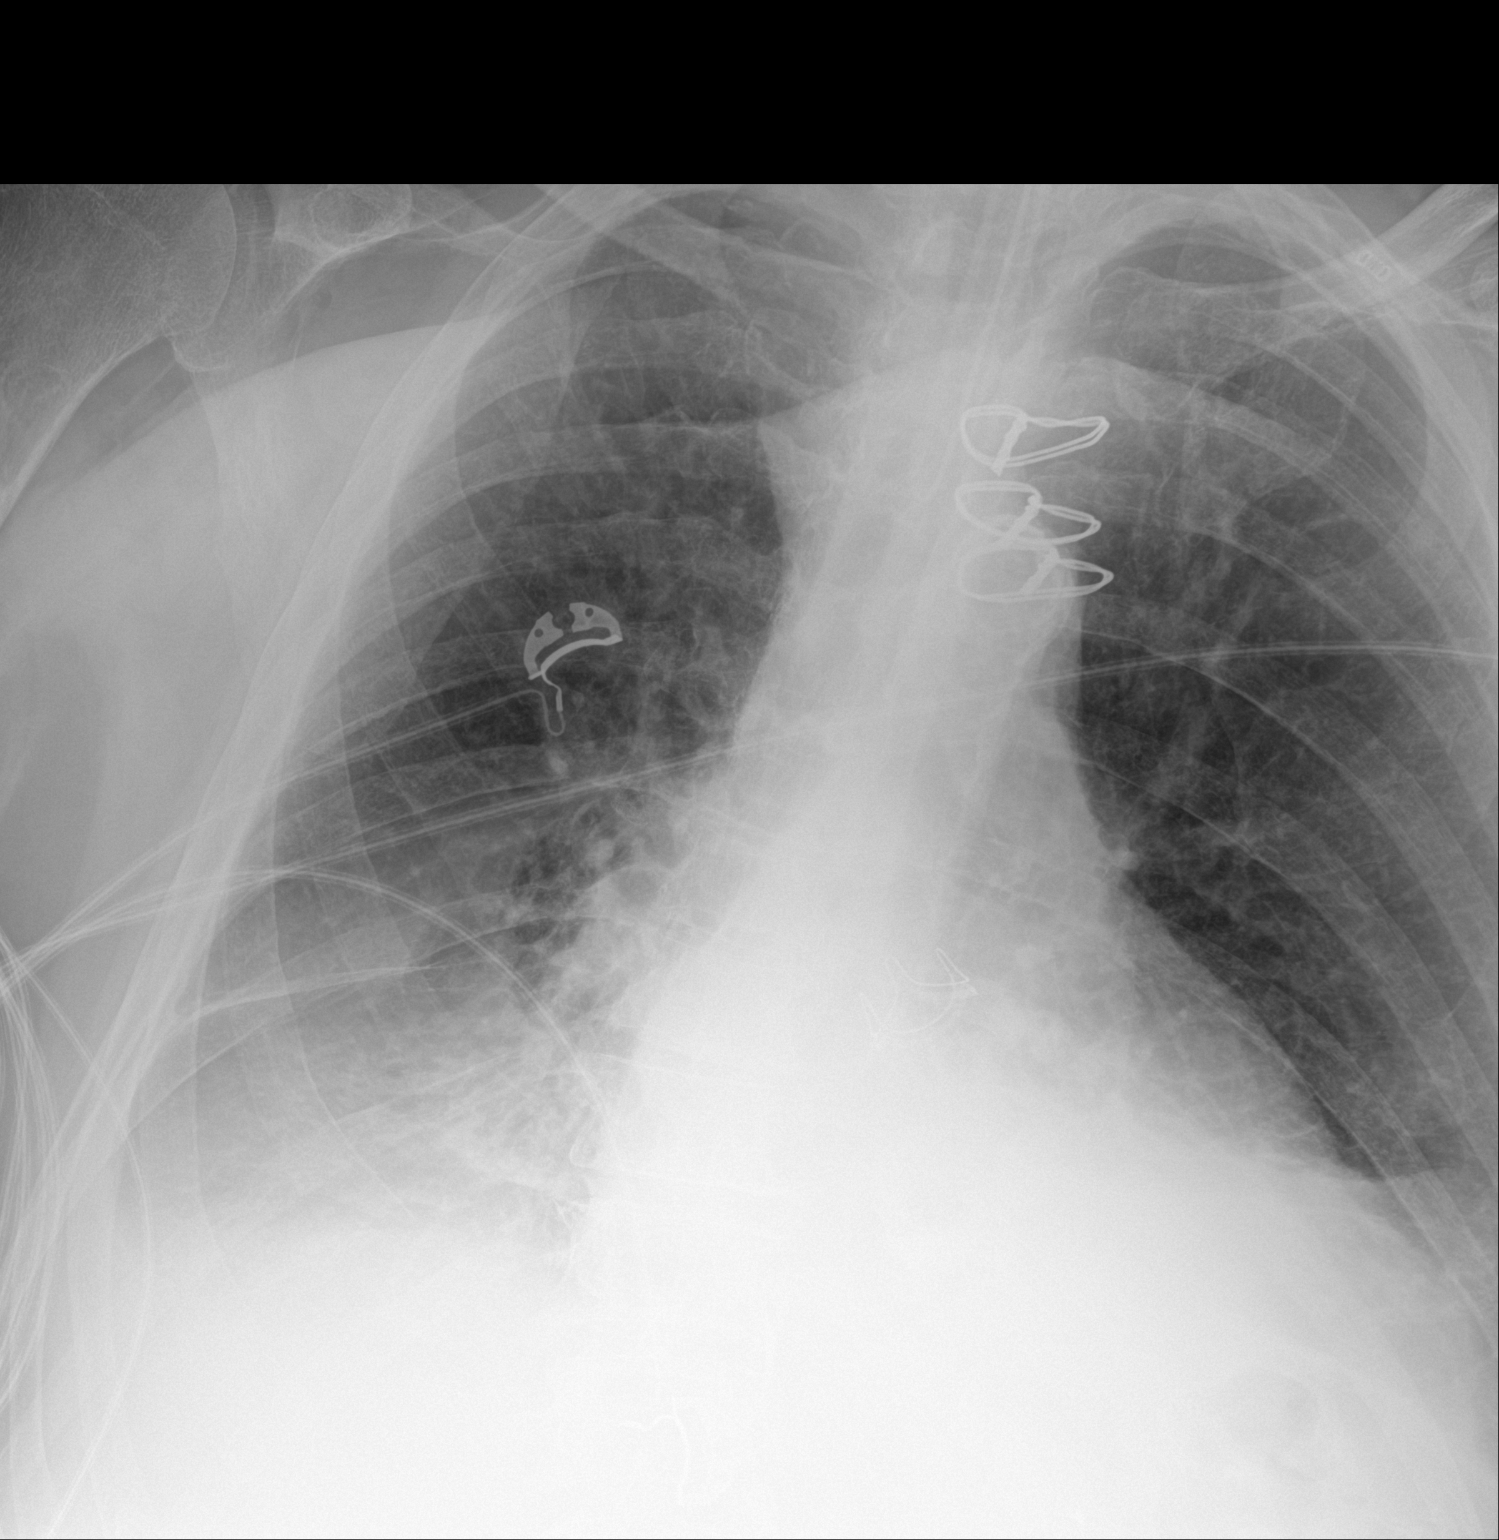
[im 2/2]
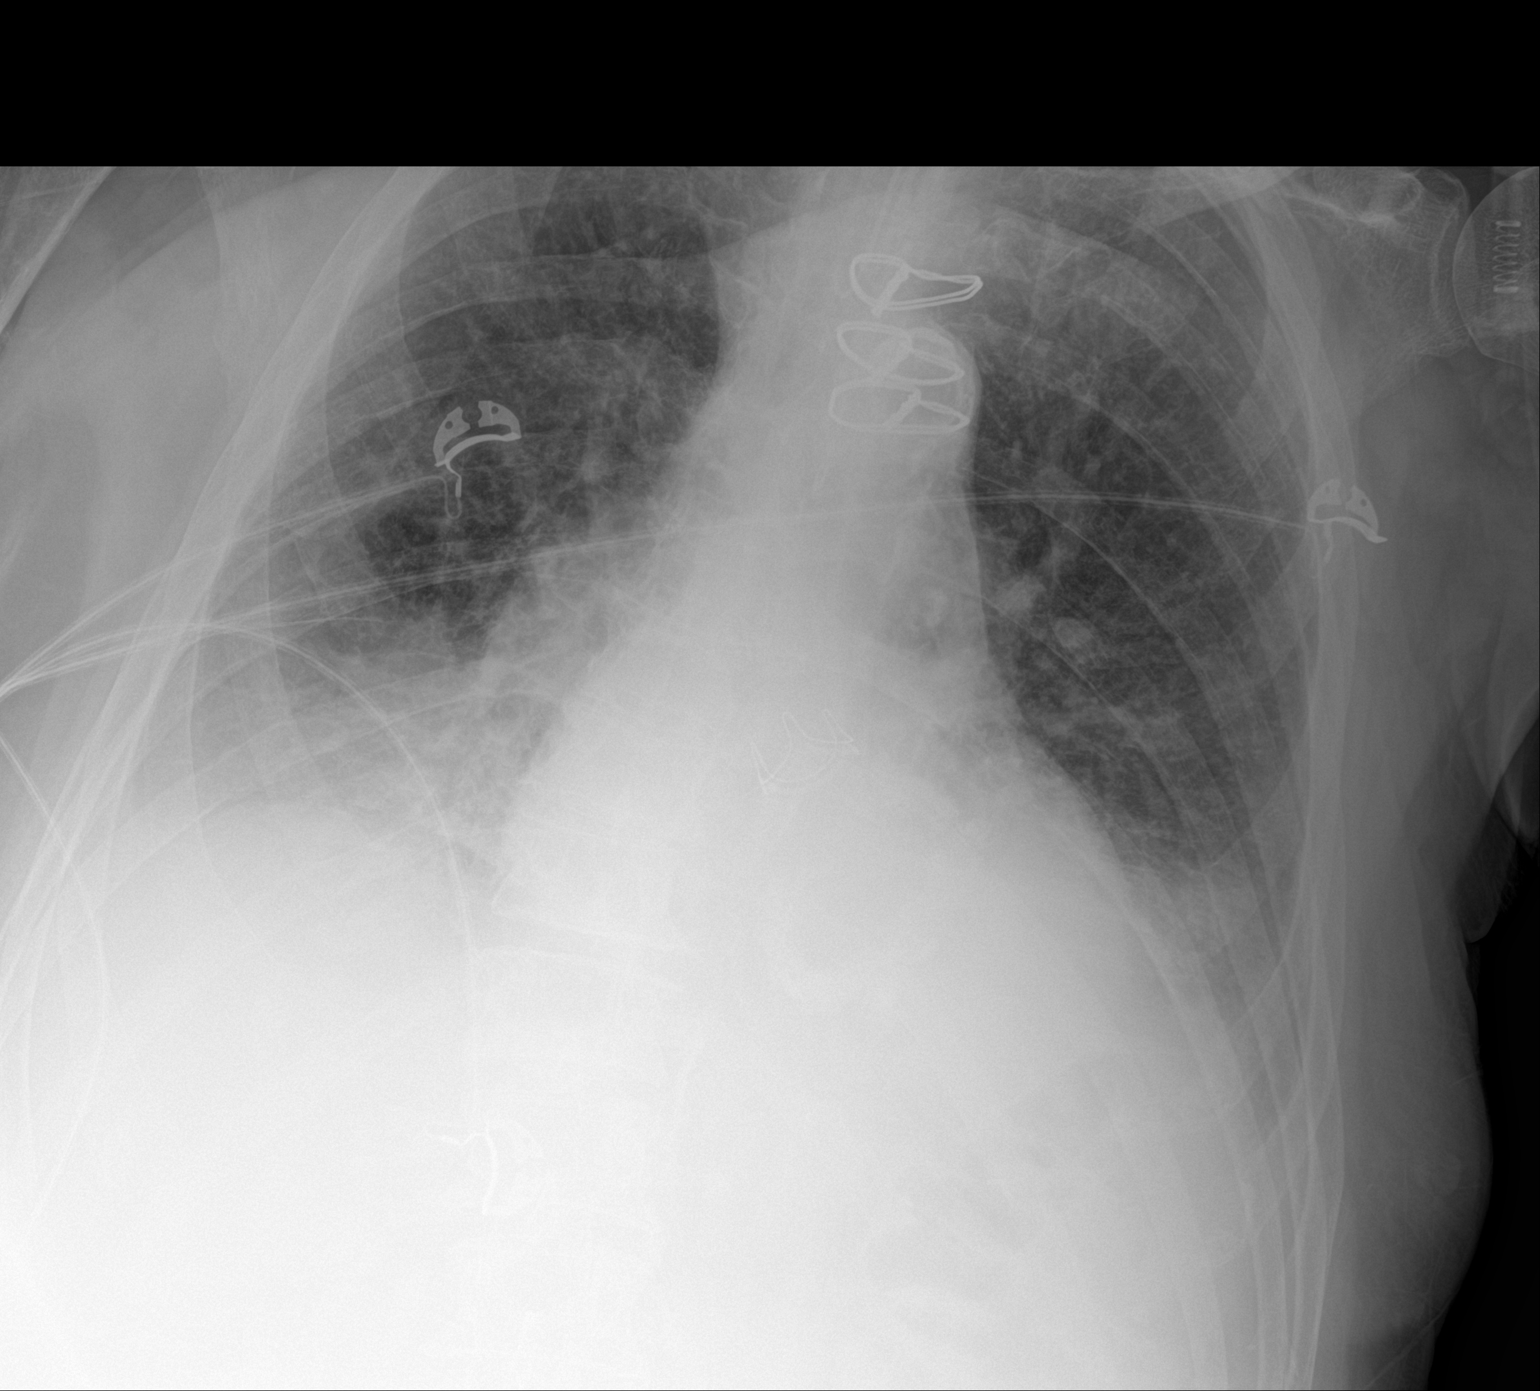

[2 of 2 positions shown; findings below may reference images not displayed]

FINDINGS: Heart is enlarged. Endotracheal tube is stable in position, 5 cm
above the carina. Edema and bilateral effusions are stable.
Bibasilar airspace disease is unchanged.
IMPRESSION: 1. Stable appearance of mild cardiomegaly with interstitial edema
and bilateral effusions, consistent the chest heart.
2. Bibasilar airspace disease is unchanged.
